# Patient Record
Sex: Female | Born: 1935
Health system: Southern US, Community
[De-identification: ages and names within clinical notes are randomized; demographics above are authoritative.]

## PROBLEM LIST (undated history)

## (undated) DIAGNOSIS — R350 Frequency of micturition: Secondary | ICD-10-CM

## (undated) DIAGNOSIS — E119 Type 2 diabetes mellitus without complications: Secondary | ICD-10-CM

## (undated) DIAGNOSIS — J449 Chronic obstructive pulmonary disease, unspecified: Secondary | ICD-10-CM

## (undated) DIAGNOSIS — E7800 Pure hypercholesterolemia, unspecified: Secondary | ICD-10-CM

## (undated) DIAGNOSIS — R7611 Nonspecific reaction to tuberculin skin test without active tuberculosis: Secondary | ICD-10-CM

## (undated) DIAGNOSIS — M1990 Unspecified osteoarthritis, unspecified site: Secondary | ICD-10-CM

## (undated) DIAGNOSIS — C3491 Malignant neoplasm of unspecified part of right bronchus or lung: Secondary | ICD-10-CM

## (undated) DIAGNOSIS — Z923 Personal history of irradiation: Secondary | ICD-10-CM

## (undated) DIAGNOSIS — Z8489 Family history of other specified conditions: Secondary | ICD-10-CM

## (undated) DIAGNOSIS — J45909 Unspecified asthma, uncomplicated: Secondary | ICD-10-CM

## (undated) DIAGNOSIS — IMO0001 Reserved for inherently not codable concepts without codable children: Secondary | ICD-10-CM

## (undated) DIAGNOSIS — I10 Essential (primary) hypertension: Secondary | ICD-10-CM

## (undated) DIAGNOSIS — M810 Age-related osteoporosis without current pathological fracture: Secondary | ICD-10-CM

## (undated) DIAGNOSIS — Z973 Presence of spectacles and contact lenses: Secondary | ICD-10-CM

## (undated) DIAGNOSIS — E559 Vitamin D deficiency, unspecified: Secondary | ICD-10-CM

## (undated) DIAGNOSIS — R251 Tremor, unspecified: Secondary | ICD-10-CM

## (undated) HISTORY — DX: Pure hypercholesterolemia, unspecified: E78.00

## (undated) HISTORY — DX: Age-related osteoporosis without current pathological fracture: M81.0

## (undated) HISTORY — DX: Malignant neoplasm of unspecified part of right bronchus or lung: C34.91

## (undated) HISTORY — PX: ABDOMINAL HYSTERECTOMY: SHX81

## (undated) HISTORY — PX: BREAST BIOPSY: SHX20

## (undated) HISTORY — DX: Personal history of irradiation: Z92.3

## (undated) HISTORY — PX: APPENDECTOMY: SHX54

## (undated) HISTORY — PX: BOWEL RESECTION: SHX1257

## (undated) HISTORY — DX: Vitamin D deficiency, unspecified: E55.9

## (undated) HISTORY — DX: Nonspecific reaction to tuberculin skin test without active tuberculosis: R76.11

## (undated) HISTORY — PX: COLONOSCOPY: SHX174

## (undated) HISTORY — DX: Chronic obstructive pulmonary disease, unspecified: J44.9

---

## 1999-02-18 ENCOUNTER — Ambulatory Visit (HOSPITAL_COMMUNITY): Admission: RE | Admit: 1999-02-18 | Discharge: 1999-02-18 | Payer: Self-pay | Admitting: Pulmonary Disease

## 1999-02-18 ENCOUNTER — Encounter: Payer: Self-pay | Admitting: Pulmonary Disease

## 2001-11-04 ENCOUNTER — Ambulatory Visit (HOSPITAL_COMMUNITY): Admission: RE | Admit: 2001-11-04 | Discharge: 2001-11-04 | Payer: Self-pay | Admitting: Pulmonary Disease

## 2001-11-04 ENCOUNTER — Encounter: Payer: Self-pay | Admitting: Pulmonary Disease

## 2001-11-13 ENCOUNTER — Encounter: Admission: RE | Admit: 2001-11-13 | Discharge: 2001-11-13 | Payer: Self-pay | Admitting: Pulmonary Disease

## 2001-11-13 ENCOUNTER — Encounter: Payer: Self-pay | Admitting: Pulmonary Disease

## 2002-05-07 ENCOUNTER — Encounter: Payer: Self-pay | Admitting: Pulmonary Disease

## 2002-05-07 ENCOUNTER — Encounter: Admission: RE | Admit: 2002-05-07 | Discharge: 2002-05-07 | Payer: Self-pay | Admitting: Pulmonary Disease

## 2002-12-08 ENCOUNTER — Encounter: Payer: Self-pay | Admitting: Pulmonary Disease

## 2002-12-08 ENCOUNTER — Encounter: Admission: RE | Admit: 2002-12-08 | Discharge: 2002-12-08 | Payer: Self-pay | Admitting: Pulmonary Disease

## 2004-12-13 ENCOUNTER — Ambulatory Visit (HOSPITAL_COMMUNITY): Admission: RE | Admit: 2004-12-13 | Discharge: 2004-12-13 | Payer: Self-pay | Admitting: Pulmonary Disease

## 2005-08-26 ENCOUNTER — Inpatient Hospital Stay (HOSPITAL_COMMUNITY): Admission: EM | Admit: 2005-08-26 | Discharge: 2005-09-05 | Payer: Self-pay | Admitting: Emergency Medicine

## 2005-12-05 ENCOUNTER — Encounter: Admission: RE | Admit: 2005-12-05 | Discharge: 2005-12-05 | Payer: Self-pay | Admitting: Pulmonary Disease

## 2007-04-17 ENCOUNTER — Encounter: Admission: RE | Admit: 2007-04-17 | Discharge: 2007-04-17 | Payer: Self-pay | Admitting: Pulmonary Disease

## 2007-05-24 ENCOUNTER — Emergency Department (HOSPITAL_COMMUNITY): Admission: EM | Admit: 2007-05-24 | Discharge: 2007-05-24 | Payer: Self-pay | Admitting: Emergency Medicine

## 2008-09-16 ENCOUNTER — Encounter: Admission: RE | Admit: 2008-09-16 | Discharge: 2008-09-16 | Payer: Self-pay | Admitting: Pulmonary Disease

## 2010-02-07 ENCOUNTER — Emergency Department (HOSPITAL_COMMUNITY): Admission: EM | Admit: 2010-02-07 | Discharge: 2010-02-08 | Payer: Self-pay | Admitting: Hematology & Oncology

## 2010-02-07 ENCOUNTER — Emergency Department (HOSPITAL_COMMUNITY): Admission: EM | Admit: 2010-02-07 | Discharge: 2010-02-07 | Payer: Self-pay | Admitting: Family Medicine

## 2010-02-14 ENCOUNTER — Ambulatory Visit (HOSPITAL_COMMUNITY): Admission: RE | Admit: 2010-02-14 | Discharge: 2010-02-14 | Payer: Self-pay | Admitting: Pulmonary Disease

## 2010-02-16 ENCOUNTER — Ambulatory Visit (HOSPITAL_COMMUNITY): Admission: RE | Admit: 2010-02-16 | Discharge: 2010-02-16 | Payer: Self-pay | Admitting: Pulmonary Disease

## 2011-02-26 LAB — POCT I-STAT, CHEM 8
BUN: 12 mg/dL (ref 6–23)
Calcium, Ion: 1.18 mmol/L (ref 1.12–1.32)
Chloride: 96 mEq/L (ref 96–112)
Potassium: 4 mEq/L (ref 3.5–5.1)
TCO2: 29 mmol/L (ref 0–100)

## 2011-04-21 NOTE — Discharge Summary (Signed)
NAMEADALIA, Erin Carr NO.:  [id number]   MEDICAL RECORD NO.:  [id number]          PATIENT TYPE:  INP   LOCATION:  5735                         FACILITY:  [facility]   PHYSICIAN:  Mina Marble, M.D.DATE OF BIRTH:  February 29, 1930   DATE OF ADMISSION:  08/26/2005  DATE OF DISCHARGE:  09/05/2005                                 DISCHARGE SUMMARY   DISCHARGE DIAGNOSES:  1.  Intestinal adhesions with small bowel obstruction.  2.  Diabetes mellitus, type 2, uncontrolled.  3.  Chronic obstructive pulmonary disease with asthma.  4.  Ascites due to #1.  5.  Gastroparesis and hypotonic bowel  6.  Hypertensive heart disease.  7.  Hypokalemia.  8.  Leukocytosis due to #1.   CONSULTATIONS:  General surgery, Currie Paris, M.D.   OPERATION/PROCEDURE:  Peritoneal adhesions lysis on August 29, 2005.   BRIEF HISTORY:  The patient is a 77 year old black female with a known  history of diabetes mellitus - type 2 on oral anti-diabetic medicine.  She  was admitted from the [facility] emergency department with  complaint of acute onset of mid abdominal pain with occasional nausea and  vomiting that did not improve with staying n.p.o. with only minimal amount  of liquid food intake.  The patient was found on abdominal x-ray series and  abdominal CT scan to have dilated small bowel loops but were filled with  fluid.   PAST MEDICAL HISTORY:  1.  The patient has a long history of diabetes mellitus, type 2, on oral      antidiabetic medications including metformin.  2.  She had an operation approximately 30 years ago for an abdominal      hysterectomy without complications until the point of this admission.   SOCIAL HISTORY:  The patient denies history of smoking and denies a history  of frequent alcoholic beverage intake.   REVIEW OF SYSTEMS:  Review of systems revealed the patient's history to be  as above.   ALLERGIES:  No drug allergies.   PHYSICAL EXAMINATION:  GENERAL:  Pertinent physical findings on August 26, 2005 to be a well-developed well-nourished black female, oriented x3 to  person, place and time.  VITAL SIGNS: On admission, temperature 98, pulse 80, respirations 18, blood  pressure 105/80.  HEENT:  The patient was alert and coherent.  Eyes revealed pupils are equal,  round and reactive to light.  NECK:  Supple with no masses or thyroid enlargement.  LUNGS:  Minimal sibilant rhonchi with cough.  HEART:  Regular rhythm with no rubs.  ABDOMEN:  Soft, but moderately protuberant with mild mid and epigastric  tenderness.  No masses were felt.  No organomegaly was detected.  GENITALIA:  Normal external female genitalia.  RECTAL:  The rectum revealed no masses.  EXTREMITIES:  Good strength and no edema bilaterally.   LABORATORY DATA:  The patient's serum glucose was above 350 mg%.  CT scan  revealed dilated fluid-filled, small bowel on abdominal CT scan.   HOSPITAL COURSE:  The patient's hospital course was one of no significant  improvement on  August 27, 2005, and general surgery consultation was  requested.  The temperature had slightly increased to 99.7 and WBC count had  increased to 19,500.  The calculated blood glucose remained elevated being  controlled by sliding scale coverage with NovoLog with the CBG levels being  in the 304 mg% to 265 mg% range.  The patient had slow peristalsis action  postoperatively and required several days before bile gas and bowel  movements were obtained, at least approximately four to five days for bowel  action until possibly September 04, 2005 before she was able to pass gas and  small bowel movements satisfactorily.  The patient did continue to improve  and was able to be discharged on September 05, 2005 for followup in my office  in November 2006.  She was advised to followup with the general surgeons in  November also as directed.      Mina Marble, M.D.   Electronically Signed     GRK/MEDQ  D:  11/30/2005  T:  12/01/2005  Job:  [id number]

## 2011-04-21 NOTE — Op Note (Signed)
NAMEKALEESI, GUYTON NO.:  [id number]   MEDICAL RECORD NO.:  [id number]          PATIENT TYPE:  INP   LOCATION:  5707                         FACILITY:  [facility]   PHYSICIAN:  Currie Paris, M.D.DATE OF BIRTH:  [birth year]   DATE OF PROCEDURE:  08/29/2005  DATE OF DISCHARGE:                                 OPERATIVE REPORT   OFFICE MEDICAL RECORD NUMBER:  [id number]   PREOPERATIVE DIAGNOSIS:  Small bowel obstruction secondary to adhesions.   POSTOPERATIVE DIAGNOSIS:  Small bowel obstruction secondary to adhesions.   OPERATIONS:  Exploratory laparotomy with lysis of adhesions.   SURGEON:  Currie Paris, M.D.   ASSISTANT:  Sharlet Salina T. Hoxworth, M.D.   ANESTHESIA:  General endotracheal.   CLINICAL HISTORY:  This is a 77 year old lady who has presented with  abdominal pain and distension.  Initial films were not remarkable, but a CT  looked to be small bowel obstruction.  She was treated initially with NG  suction and IV fluids; after that, diagnosis was made, but has failed to  improve over about 36 hours of NG suction.  Her abdomen seemed more  distended today and more tender in the lower abdomen, although she felt that  she was a little bit better with her NG tube.  After discussion with the  patient, we elected to proceed to surgery.   DESCRIPTION OF PROCEDURE:  The patient was seen in the holding area and she  had no further questions.  She was taken to the operating room and after  satisfactory general anesthesia had been obtained, the abdomen was prepped  and draped.  The time-out occurred.   A short midline incision from just above the umbilicus to just below it was  made.  She a lower midline scar from prior hysterectomy.  The abdominal  cavity was carefully entered and there was a little bloody fluid present.  There were markedly dilated loops of bowel and as I tried to manipulate  these out, I could see a very hard area of distended small  bowel posteriorly  that I clearly was not going to be able to safely manipulate with my limited  incision.  The incision was therefore extended superiorly and inferiorly.   Once that was done, I started to mobilize the distended bowel out of the  abdomen.  I saw some distal bowel that was completely empty.  As I was  pulling some of this up, the markedly distended and erythematous bowel came  up suddenly  with a little bit of the give and upon doing that, the  obstruction seemed to relieve itself, as all of a sudden the empty distal  bowel again to fill with fluid.  I manipulated all the small bowel out and  there was a loop of mid-jejunum that had been apparently trapped between 2  adhesions and the mesentery was edematous and hemorrhagic, and the bowel was  markedly edematous as well in this segment.  Distal to that, there was  another short segment that seemed to probably have been also involved.  However, there was nothing that  looked ischemic and this all appeared  completely healthy.   At this point, we manipulated about 2 L of small bowel contents retrograde  into the stomach,. where it was able to be suctioned out through the NG to  decompress this, as I thought we were going to have a difficult time  closing.  I then ran the small bowel to make sure there were no lesions  noted and everything else appeared okay.  There were no palpable masses in  the colon, the liver, gallbladder, etc.   Once this was done, I went ahead and placed the bowel back in in its usual  anatomic position.  The abdomen was then closed with a running #1 double-  looped PDS.  The wound was irrigated and the skin closed with staples.   The patient tolerated the procedure well.  There were no operative  complications.  All counts were correct.      Currie Paris, M.D.  Electronically Signed     CJS/MEDQ  D:  08/29/2005  T:  08/30/2005  Job:  [id number]   cc:   Mina Marble, M.D.  Fax:  [phone]

## 2011-04-21 NOTE — Consult Note (Signed)
NAMEALIEGHA, PAULLIN NO.:  [id number]   MEDICAL RECORD NO.:  [id number]          PATIENT TYPE:  INP   LOCATION:  5707                         FACILITY:  [facility]   PHYSICIAN:  Gabrielle Dare. Janee Morn, M.D.DATE OF BIRTH:  1930-11-29   DATE OF CONSULTATION:  08/27/2005  DATE OF DISCHARGE:                                   CONSULTATION   REASON FOR CONSULTATION:  Small bowel obstruction.   HISTORY OF PRESENT ILLNESS:  I was asked to evaluate this pleasant 77-year-  old African American female by Dr. Corine Shelter in regards to small  bowel obstruction.  She was admitted yesterday with abdominal pain.  Initial  workup included an abdominal series which was unremarkable.  CT scan of the  abdomen and pelvis showed some dilated small bowel loops but no clear  obstruction.  Plane films were repeated today but, again, were unimpressive.  I suspect this is due to dilated small bowel loops being fluid filled.  The  patient had some nausea and vomiting today and CT scan was repeated and more  clearly delineated partial small bowel obstruction.  Some contrast is  present in the smaller caliber small bowel distal to the area of obstruction  and some of this contrast enters into the colon.  An NG tube was just  placed.  The patient said she was previously having some cramping pain which  has resolved since the NG tube was inserted.  It evacuated about 800 mL.  She has had no flatus or bowel movement today.   PAST MEDICAL HISTORY:  Non-insulin dependent diabetes mellitus.   PAST SURGICAL HISTORY:  Hysterectomy via lower midline incision many years  ago.   ALLERGIES:  No known drug allergies.   MEDICATIONS CURRENTLY:  Please see the chart.   REVIEW OF SYMPTOMS:  CARDIAC:  Negative.  PULMONARY:  Negative.  GI:  See  history of present illness.  She has no previous history of bowel  obstruction.  GU:  Negative.  MUSCULOSKELETAL:  Negative.  GENERAL:  Negative.   PHYSICAL  EXAMINATION:  VITAL SIGNS:  Temperature 99.7, blood pressure  130/60, heart rate 97, respirations 20.  GENERAL:  She is awake and alert.  HEENT:  Sclerae is clear.  NECK:  Supple.  LUNGS:  Clear to auscultation with normal respiratory excursion.  HEART:  Regular, impulse is palpable along the left chest.  ABDOMEN:  Somewhat distended, bowel sounds present, she has no appreciable  tenderness at this time.  No masses are noted.  SKIN:  Warm and dry.   LABORATORY DATA:  Sodium 141, potassium 4.4, chloride 109, CO2 26, BUN 29,  creatinine 1.5, glucose 292.  White blood cell count 19.5, hemoglobin 13.2,  platelets 270.   IMPRESSION:  Partial small bowel obstruction.   RECOMMENDATIONS:  Continue her NG tube decompression.  We will follow her  very closely.  If this obstruction, which is likely due to adhesions, does  not resolve or the patient's pain returns, she will need exploration with  lysis of adhesions.  This plan was discussed in detail with the patient.  Gabrielle Dare Janee Morn, M.D.  Electronically Signed     BET/MEDQ  D:  08/27/2005  T:  08/28/2005  Job:  [id number]   cc:   Mina Marble, M.D.  Fax: [phone]

## 2011-09-20 LAB — DIFFERENTIAL
Basophils Absolute: 0
Basophils Relative: 0
Eosinophils Relative: 1
Lymphocytes Relative: 8 — ABNORMAL LOW
Lymphs Abs: 1.2
Monocytes Absolute: 0.9 — ABNORMAL HIGH
Neutro Abs: 12.7 — ABNORMAL HIGH
Neutrophils Relative %: 85 — ABNORMAL HIGH

## 2011-09-20 LAB — COMPREHENSIVE METABOLIC PANEL
BUN: 14
CO2: 21
Calcium: 9.2
GFR calc Af Amer: >60
Potassium: 3.7
Sodium: 132 — ABNORMAL LOW
Total Protein: 6.7

## 2011-09-20 LAB — CBC
Hemoglobin: 12.7
MCHC: 32.3
MCV: 81.8

## 2011-11-09 ENCOUNTER — Other Ambulatory Visit: Payer: Self-pay | Admitting: Pulmonary Disease

## 2011-11-09 DIAGNOSIS — Z1231 Encounter for screening mammogram for malignant neoplasm of breast: Secondary | ICD-10-CM

## 2011-12-07 ENCOUNTER — Ambulatory Visit
Admission: RE | Admit: 2011-12-07 | Discharge: 2011-12-07 | Disposition: A | Discharge status: Home / Self Care | Payer: Medicare Other | Source: Ambulatory Visit | Attending: Pulmonary Disease | Admitting: Pulmonary Disease

## 2011-12-07 DIAGNOSIS — Z1231 Encounter for screening mammogram for malignant neoplasm of breast: Secondary | ICD-10-CM

## 2012-01-16 DIAGNOSIS — E119 Type 2 diabetes mellitus without complications: Secondary | ICD-10-CM | POA: Diagnosis not present

## 2012-01-16 DIAGNOSIS — I119 Hypertensive heart disease without heart failure: Secondary | ICD-10-CM | POA: Diagnosis not present

## 2012-01-16 DIAGNOSIS — J45909 Unspecified asthma, uncomplicated: Secondary | ICD-10-CM | POA: Diagnosis not present

## 2012-01-16 DIAGNOSIS — Z79899 Other long term (current) drug therapy: Secondary | ICD-10-CM | POA: Diagnosis not present

## 2012-01-16 DIAGNOSIS — E7800 Pure hypercholesterolemia, unspecified: Secondary | ICD-10-CM | POA: Diagnosis not present

## 2012-05-14 DIAGNOSIS — E7800 Pure hypercholesterolemia, unspecified: Secondary | ICD-10-CM | POA: Diagnosis not present

## 2012-05-14 DIAGNOSIS — J45902 Unspecified asthma with status asthmaticus: Secondary | ICD-10-CM | POA: Diagnosis not present

## 2012-05-14 DIAGNOSIS — R259 Unspecified abnormal involuntary movements: Secondary | ICD-10-CM | POA: Diagnosis not present

## 2012-05-14 DIAGNOSIS — Z79899 Other long term (current) drug therapy: Secondary | ICD-10-CM | POA: Diagnosis not present

## 2012-05-14 DIAGNOSIS — I119 Hypertensive heart disease without heart failure: Secondary | ICD-10-CM | POA: Diagnosis not present

## 2012-08-26 DIAGNOSIS — E119 Type 2 diabetes mellitus without complications: Secondary | ICD-10-CM | POA: Diagnosis not present

## 2012-08-26 DIAGNOSIS — I119 Hypertensive heart disease without heart failure: Secondary | ICD-10-CM | POA: Diagnosis not present

## 2012-08-26 DIAGNOSIS — J019 Acute sinusitis, unspecified: Secondary | ICD-10-CM | POA: Diagnosis not present

## 2012-08-26 DIAGNOSIS — E7800 Pure hypercholesterolemia, unspecified: Secondary | ICD-10-CM | POA: Diagnosis not present

## 2012-08-26 DIAGNOSIS — D638 Anemia in other chronic diseases classified elsewhere: Secondary | ICD-10-CM | POA: Diagnosis not present

## 2012-08-26 DIAGNOSIS — M810 Age-related osteoporosis without current pathological fracture: Secondary | ICD-10-CM | POA: Diagnosis not present

## 2012-08-26 DIAGNOSIS — Z23 Encounter for immunization: Secondary | ICD-10-CM | POA: Diagnosis not present

## 2012-08-26 DIAGNOSIS — I10 Essential (primary) hypertension: Secondary | ICD-10-CM | POA: Diagnosis not present

## 2012-11-21 DIAGNOSIS — E559 Vitamin D deficiency, unspecified: Secondary | ICD-10-CM | POA: Diagnosis not present

## 2012-11-21 DIAGNOSIS — J449 Chronic obstructive pulmonary disease, unspecified: Secondary | ICD-10-CM | POA: Diagnosis not present

## 2013-02-20 DIAGNOSIS — IMO0001 Reserved for inherently not codable concepts without codable children: Secondary | ICD-10-CM | POA: Diagnosis not present

## 2013-03-31 DIAGNOSIS — H2510 Age-related nuclear cataract, unspecified eye: Secondary | ICD-10-CM | POA: Diagnosis not present

## 2013-03-31 DIAGNOSIS — E119 Type 2 diabetes mellitus without complications: Secondary | ICD-10-CM | POA: Diagnosis not present

## 2013-03-31 DIAGNOSIS — E11319 Type 2 diabetes mellitus with unspecified diabetic retinopathy without macular edema: Secondary | ICD-10-CM | POA: Diagnosis not present

## 2013-07-31 DIAGNOSIS — H2510 Age-related nuclear cataract, unspecified eye: Secondary | ICD-10-CM | POA: Diagnosis not present

## 2013-07-31 DIAGNOSIS — E119 Type 2 diabetes mellitus without complications: Secondary | ICD-10-CM | POA: Diagnosis not present

## 2013-07-31 DIAGNOSIS — E11319 Type 2 diabetes mellitus with unspecified diabetic retinopathy without macular edema: Secondary | ICD-10-CM | POA: Diagnosis not present

## 2013-10-16 DIAGNOSIS — I119 Hypertensive heart disease without heart failure: Secondary | ICD-10-CM | POA: Diagnosis not present

## 2013-10-16 DIAGNOSIS — M109 Gout, unspecified: Secondary | ICD-10-CM | POA: Diagnosis not present

## 2013-10-16 DIAGNOSIS — J449 Chronic obstructive pulmonary disease, unspecified: Secondary | ICD-10-CM | POA: Diagnosis not present

## 2013-10-16 DIAGNOSIS — E119 Type 2 diabetes mellitus without complications: Secondary | ICD-10-CM | POA: Diagnosis not present

## 2013-10-16 DIAGNOSIS — E559 Vitamin D deficiency, unspecified: Secondary | ICD-10-CM | POA: Diagnosis not present

## 2013-10-16 DIAGNOSIS — Z23 Encounter for immunization: Secondary | ICD-10-CM | POA: Diagnosis not present

## 2013-12-30 ENCOUNTER — Other Ambulatory Visit: Payer: Self-pay

## 2013-12-30 DIAGNOSIS — Z1231 Encounter for screening mammogram for malignant neoplasm of breast: Secondary | ICD-10-CM

## 2014-01-15 ENCOUNTER — Ambulatory Visit
Admission: RE | Admit: 2014-01-15 | Discharge: 2014-01-15 | Disposition: A | Discharge status: Home / Self Care | Payer: Medicare Other | Source: Ambulatory Visit

## 2014-01-15 DIAGNOSIS — Z1231 Encounter for screening mammogram for malignant neoplasm of breast: Secondary | ICD-10-CM | POA: Diagnosis not present

## 2014-02-12 DIAGNOSIS — J449 Chronic obstructive pulmonary disease, unspecified: Secondary | ICD-10-CM | POA: Diagnosis not present

## 2014-02-12 DIAGNOSIS — IMO0001 Reserved for inherently not codable concepts without codable children: Secondary | ICD-10-CM | POA: Diagnosis not present

## 2014-02-12 DIAGNOSIS — I119 Hypertensive heart disease without heart failure: Secondary | ICD-10-CM | POA: Diagnosis not present

## 2014-02-12 DIAGNOSIS — N8111 Cystocele, midline: Secondary | ICD-10-CM | POA: Diagnosis not present

## 2014-02-19 DIAGNOSIS — E1139 Type 2 diabetes mellitus with other diabetic ophthalmic complication: Secondary | ICD-10-CM | POA: Diagnosis not present

## 2014-02-19 DIAGNOSIS — E11319 Type 2 diabetes mellitus with unspecified diabetic retinopathy without macular edema: Secondary | ICD-10-CM | POA: Diagnosis not present

## 2014-02-19 DIAGNOSIS — H2510 Age-related nuclear cataract, unspecified eye: Secondary | ICD-10-CM | POA: Diagnosis not present

## 2014-03-20 DIAGNOSIS — Z124 Encounter for screening for malignant neoplasm of cervix: Secondary | ICD-10-CM | POA: Diagnosis not present

## 2014-03-20 DIAGNOSIS — N8111 Cystocele, midline: Secondary | ICD-10-CM | POA: Diagnosis not present

## 2014-04-07 DIAGNOSIS — Z469 Encounter for fitting and adjustment of unspecified device: Secondary | ICD-10-CM | POA: Diagnosis not present

## 2014-05-12 DIAGNOSIS — N8111 Cystocele, midline: Secondary | ICD-10-CM | POA: Diagnosis not present

## 2014-05-27 DIAGNOSIS — N8111 Cystocele, midline: Secondary | ICD-10-CM | POA: Diagnosis not present

## 2014-05-27 DIAGNOSIS — R300 Dysuria: Secondary | ICD-10-CM | POA: Diagnosis not present

## 2014-06-09 DIAGNOSIS — IMO0002 Reserved for concepts with insufficient information to code with codable children: Secondary | ICD-10-CM | POA: Diagnosis not present

## 2014-06-09 DIAGNOSIS — N8111 Cystocele, midline: Secondary | ICD-10-CM | POA: Diagnosis not present

## 2014-06-09 DIAGNOSIS — N9989 Other postprocedural complications and disorders of genitourinary system: Secondary | ICD-10-CM | POA: Diagnosis not present

## 2014-06-11 DIAGNOSIS — I119 Hypertensive heart disease without heart failure: Secondary | ICD-10-CM | POA: Diagnosis not present

## 2014-06-11 DIAGNOSIS — J449 Chronic obstructive pulmonary disease, unspecified: Secondary | ICD-10-CM | POA: Diagnosis not present

## 2014-06-11 DIAGNOSIS — E119 Type 2 diabetes mellitus without complications: Secondary | ICD-10-CM | POA: Diagnosis not present

## 2014-06-11 DIAGNOSIS — E559 Vitamin D deficiency, unspecified: Secondary | ICD-10-CM | POA: Diagnosis not present

## 2014-07-30 DIAGNOSIS — N8111 Cystocele, midline: Secondary | ICD-10-CM | POA: Diagnosis not present

## 2014-08-03 DIAGNOSIS — N950 Postmenopausal bleeding: Secondary | ICD-10-CM | POA: Diagnosis not present

## 2014-08-12 DIAGNOSIS — N950 Postmenopausal bleeding: Secondary | ICD-10-CM | POA: Diagnosis not present

## 2014-09-21 DIAGNOSIS — N8110 Cystocele, unspecified: Secondary | ICD-10-CM | POA: Diagnosis not present

## 2014-10-15 DIAGNOSIS — I119 Hypertensive heart disease without heart failure: Secondary | ICD-10-CM | POA: Diagnosis not present

## 2014-10-15 DIAGNOSIS — Z23 Encounter for immunization: Secondary | ICD-10-CM | POA: Diagnosis not present

## 2014-10-15 DIAGNOSIS — J449 Chronic obstructive pulmonary disease, unspecified: Secondary | ICD-10-CM | POA: Diagnosis not present

## 2014-10-15 DIAGNOSIS — J4521 Mild intermittent asthma with (acute) exacerbation: Secondary | ICD-10-CM | POA: Diagnosis not present

## 2014-10-15 DIAGNOSIS — Z79899 Other long term (current) drug therapy: Secondary | ICD-10-CM | POA: Diagnosis not present

## 2014-10-19 DIAGNOSIS — R58 Hemorrhage, not elsewhere classified: Secondary | ICD-10-CM | POA: Diagnosis not present

## 2014-10-19 DIAGNOSIS — N8110 Cystocele, unspecified: Secondary | ICD-10-CM | POA: Diagnosis not present

## 2014-12-02 ENCOUNTER — Other Ambulatory Visit: Payer: Self-pay | Admitting: Obstetrics and Gynecology

## 2014-12-07 ENCOUNTER — Other Ambulatory Visit (HOSPITAL_COMMUNITY): Payer: Self-pay | Admitting: Obstetrics and Gynecology

## 2014-12-07 NOTE — H&P (Signed)
Erin Carr is a 81 y.o.  female G0 for anterior-posterior vaginal repair because of symptomatic pelvic prolapse.  Since 2012 the patient reports urinary urgency, incontinence and slowed bowel emptying that was initially treated with observation.   Over the past 7 months however, she  began using a pessary.  The device offered some relief however, it caused ulceration of her vaginal wall with accompanying granulation tissue. Patient admits to a recurrence of urinary incontinence, urgency and constipation once she had to discontinue the device. .  She denies any pelvic pain, vaginitis symptoms, hematuria, dysuria or lower back pain. The patient was placed on estrogen vaginal cream to improve  atrophic vaginal changes and to promote healing of the vaginal mucosa.  A review of both medical and surgical management options were given to the patient however, she has decided to proceed with surgical management of her pelvic prolapse.   Past Medical History  OB History: G0  GYN History: menarche: 81 YO    LMP: menopausal    Contracepton;   Denies history of abnormal PAP smear;   Last PAP smear: 2015-normal Medical History: Diabetes Mellitus, Hyperlipidemia, Hypertension, Asthma and Anemia  Surgical History: 1962 Hysterectomy/Appendectomy and Blood Transfusion Denies problems with anesthesia.  Family History: Diabetes Mellitus, Hypertension, Cardiovascular Disease, Prostate Cancer and "Female Organ" Cancer   Social History: Single;  Denies tobacco or alcohol use   Outpatient Encounter Prescriptions as of 12/07/2014  Medication Sig  . amLODipine (NORVASC) 10 MG tablet Take 10 mg by mouth daily.  Marland Kitchen aspirin 81 MG chewable tablet Chew 81 mg by mouth daily.  Marland Kitchen estradiol (ESTRACE) 0.1 MG/GM vaginal cream Place 1 Applicatorful vaginally 3 (three) times a week.  . furosemide (LASIX) 40 MG tablet Take 40 mg by mouth 2 (two) times daily.  Marland Kitchen gabapentin (NEURONTIN) 400 MG capsule Take 400 mg by mouth 2 (two)  times daily.  Marland Kitchen glipiZIDE (GLUCOTROL) 5 MG tablet Take 5 mg by mouth daily before breakfast.  . labetalol (NORMODYNE) 100 MG tablet Take 100 mg by mouth 2 (two) times daily.  Marland Kitchen lisinopril (PRINIVIL,ZESTRIL) 20 MG tablet Take 20 mg by mouth 2 (two) times daily.  . metFORMIN (GLUCOPHAGE) 500 MG tablet Take 500 mg by mouth 2 (two) times daily with a meal.  . potassium chloride (K-DUR) 10 MEQ tablet Take 10 mEq by mouth daily.  . propranolol (INDERAL) 10 MG tablet Take 10 mg by mouth 2 (two) times daily.  . simvastatin (ZOCOR) 20 MG tablet Take 20 mg by mouth daily.    No Known Allergies   Denies sensitivity to peanuts, shellfish, soy, latex or adhesives.   ROS: Admits to glasses, upper jaw dentures, constipation, shortness of breath only with asthma attacks and rare bilateral pedal edema;  Denies headache, vision changes, nasal congestion, dysphagia, tinnitus, dizziness, hoarseness, cough,  chest pain,  nausea, vomiting, diarrhea,  urinary frequency,  dysuria, hematuria, vaginitis symptoms, pelvic pain, swelling of joints,easy bruising,  myalgias, arthralgias, skin rashes, unexplained weight loss and except as is mentioned in the history of present illness, patient's review of systems is otherwise negative.  Physical Exam  B/P: 138/70   P: 64 bpm    R: 16 Weight: 155 lbs.  Height: 5'2.5 "  BMI: 27.9  Neck: supple without masses or thyromegaly Lungs: clear to auscultation Heart: regular rate and rhythm Abdomen: soft, non-tender and no organomegaly Pelvic:EGBUS- wnl; vagina-atrophic with 4/4 cystocele bulging from the vaginal opening with a 2/4 rectocele; uterus/cervix-surgically absent; adnexae-no tenderness or masses  Extremities:  no clubbing, cyanosis or edema   Assesment: Pelvic Prolapse                       Large Cystocele   Disposition:  A discussion was held with patient regarding the indication for her procedure(s) along with the risks, which include but are not limited to:  reaction to anesthesia, damage to adjacent organs, infection and excessive bleeding. The patient has decided to proceed with an Anterior Repair with Possible Posterior Repair at [place] on December 16, 2014.  CSN# [id number]   Markeda Narvaez J. Florene Glen, PA-C  for Dr. Franklyn Lor. Dillard

## 2014-12-09 ENCOUNTER — Other Ambulatory Visit: Payer: Self-pay

## 2014-12-09 ENCOUNTER — Encounter (HOSPITAL_COMMUNITY)
Admission: RE | Admit: 2014-12-09 | Discharge: 2014-12-09 | Disposition: A | Discharge status: Home / Self Care | Payer: Medicare Other | Source: Ambulatory Visit | Attending: Obstetrics and Gynecology | Admitting: Obstetrics and Gynecology

## 2014-12-09 ENCOUNTER — Encounter (HOSPITAL_COMMUNITY): Payer: Self-pay

## 2014-12-09 DIAGNOSIS — Z01818 Encounter for other preprocedural examination: Secondary | ICD-10-CM | POA: Diagnosis not present

## 2014-12-09 HISTORY — DX: Unspecified asthma, uncomplicated: J45.909

## 2014-12-09 HISTORY — DX: Reserved for inherently not codable concepts without codable children: IMO0001

## 2014-12-09 HISTORY — DX: Type 2 diabetes mellitus without complications: E11.9

## 2014-12-09 HISTORY — DX: Tremor, unspecified: R25.1

## 2014-12-09 HISTORY — DX: Pure hypercholesterolemia, unspecified: E78.00

## 2014-12-09 HISTORY — DX: Essential (primary) hypertension: I10

## 2014-12-09 LAB — BASIC METABOLIC PANEL
ANION GAP: 6 (ref 5–15)
BUN: 14 mg/dL (ref 6–23)
CHLORIDE: 105 meq/L (ref 96–112)
CO2: 30 mmol/L (ref 19–32)
Calcium: 9.5 mg/dL (ref 8.4–10.5)
Creatinine, Ser: 0.71 mg/dL (ref 0.50–1.10)
GFR calc non Af Amer: 78 mL/min — ABNORMAL LOW (ref >90)
Glucose, Bld: 225 mg/dL — ABNORMAL HIGH (ref 70–99)
Potassium: 3.6 mmol/L (ref 3.5–5.1)
SODIUM: 141 mmol/L (ref 135–145)

## 2014-12-09 LAB — CBC
HCT: 36.9 % (ref 36.0–46.0)
HEMOGLOBIN: 11.4 g/dL — AB (ref 12.0–15.0)
MCH: 26 pg (ref 26.0–34.0)
MCHC: 30.9 g/dL (ref 30.0–36.0)
MCV: 84.1 fL (ref 78.0–100.0)
PLATELETS: 198 10*3/uL (ref 150–400)
RBC: 4.39 MIL/uL (ref 3.87–5.11)
RDW: 14.1 % (ref 11.5–15.5)
WBC: 7.1 10*3/uL (ref 4.0–10.5)

## 2014-12-09 NOTE — Patient Instructions (Addendum)
Your procedure is scheduled on:  Wednesday, December 16, 2014  Enter through the [employer] [facility] at: 11:30 A.M.  Pick up the phone at the desk and dial [phone].  Call this number if you have problems the morning of surgery: [phone].  Remember: Do NOT eat food: AFTER MIDNIGHT TUESDAY  Do NOT drink clear liquids after:  AFTER 9:00 A.Laverle Hobby Take these medicines the morning of surgery with a SIP OF WATER: TAKE ALL MORNING MEDICATIONS *EXCEPT ASPIRIN, LASIX AND GLUCOTROL* *DO NOT TAKE METFORMIN 24 HOURS PRIOR TO SURGERY  Do NOT wear jewelry (body piercing), metal hair clips/bobby pins, make-up, or nail polish. Do NOT wear lotions, powders, or perfumes.  You may wear deoderant. Do NOT shave for 48 hours prior to surgery. Do NOT bring valuables to the hospital. Contacts, dentures, or bridgework may not be worn into surgery. Have a responsible adult drive you home and stay with you for 24 hours after your procedure

## 2014-12-16 ENCOUNTER — Encounter (HOSPITAL_COMMUNITY)
Admission: RE | Disposition: A | Discharge status: Home / Self Care | Payer: Self-pay | Source: Ambulatory Visit | Attending: Obstetrics and Gynecology

## 2014-12-16 ENCOUNTER — Ambulatory Visit (HOSPITAL_COMMUNITY): Payer: Medicare Other | Admitting: Anesthesiology

## 2014-12-16 ENCOUNTER — Observation Stay (HOSPITAL_COMMUNITY)
Admission: RE | Admit: 2014-12-16 | Discharge: 2014-12-18 | Disposition: A | Discharge status: Home / Self Care | Payer: Medicare Other | Source: Ambulatory Visit | Attending: Obstetrics and Gynecology | Admitting: Obstetrics and Gynecology

## 2014-12-16 ENCOUNTER — Encounter (HOSPITAL_COMMUNITY): Payer: Self-pay | Admitting: *Deleted

## 2014-12-16 DIAGNOSIS — Z7982 Long term (current) use of aspirin: Secondary | ICD-10-CM | POA: Diagnosis not present

## 2014-12-16 DIAGNOSIS — J45909 Unspecified asthma, uncomplicated: Secondary | ICD-10-CM | POA: Insufficient documentation

## 2014-12-16 DIAGNOSIS — N8189 Other female genital prolapse: Secondary | ICD-10-CM | POA: Insufficient documentation

## 2014-12-16 DIAGNOSIS — E119 Type 2 diabetes mellitus without complications: Secondary | ICD-10-CM | POA: Diagnosis not present

## 2014-12-16 DIAGNOSIS — N819 Female genital prolapse, unspecified: Secondary | ICD-10-CM | POA: Diagnosis present

## 2014-12-16 DIAGNOSIS — R3915 Urgency of urination: Secondary | ICD-10-CM | POA: Diagnosis present

## 2014-12-16 DIAGNOSIS — N8110 Cystocele, unspecified: Principal | ICD-10-CM | POA: Insufficient documentation

## 2014-12-16 DIAGNOSIS — N816 Rectocele: Secondary | ICD-10-CM | POA: Diagnosis not present

## 2014-12-16 DIAGNOSIS — I10 Essential (primary) hypertension: Secondary | ICD-10-CM | POA: Insufficient documentation

## 2014-12-16 DIAGNOSIS — E785 Hyperlipidemia, unspecified: Secondary | ICD-10-CM | POA: Diagnosis not present

## 2014-12-16 DIAGNOSIS — D649 Anemia, unspecified: Secondary | ICD-10-CM | POA: Insufficient documentation

## 2014-12-16 DIAGNOSIS — R5082 Postprocedural fever: Secondary | ICD-10-CM | POA: Insufficient documentation

## 2014-12-16 DIAGNOSIS — N3941 Urge incontinence: Secondary | ICD-10-CM | POA: Diagnosis not present

## 2014-12-16 DIAGNOSIS — IMO0002 Reserved for concepts with insufficient information to code with codable children: Secondary | ICD-10-CM

## 2014-12-16 HISTORY — PX: ANTERIOR AND POSTERIOR REPAIR: SHX5121

## 2014-12-16 LAB — GLUCOSE, CAPILLARY
Glucose-Capillary: 133 mg/dL — ABNORMAL HIGH (ref 70–99)
Glucose-Capillary: 84 mg/dL (ref 70–99)

## 2014-12-16 SURGERY — ANTERIOR (CYSTOCELE) AND POSTERIOR REPAIR (RECTOCELE)
Anesthesia: Spinal | Site: Vagina

## 2014-12-16 MED ORDER — VASOPRESSIN 20 UNIT/ML IV SOLN
INTRAVENOUS | Status: AC
  Filled 2014-12-16: qty 1

## 2014-12-16 MED ORDER — LACTATED RINGERS IV SOLN
INTRAVENOUS | Status: DC
  Administered 2014-12-16 – 2014-12-17 (×2): via INTRAVENOUS

## 2014-12-16 MED ORDER — MORPHINE SULFATE 0.5 MG/ML IJ SOLN
INTRAMUSCULAR | Status: AC
  Filled 2014-12-16: qty 10

## 2014-12-16 MED ORDER — ONDANSETRON HCL 4 MG/2ML IJ SOLN
INTRAMUSCULAR | Status: AC
  Filled 2014-12-16: qty 2

## 2014-12-16 MED ORDER — SODIUM CHLORIDE 0.9 % IJ SOLN
INTRAMUSCULAR | Status: DC | PRN
  Administered 2014-12-16: 50 mL via INTRAVENOUS

## 2014-12-16 MED ORDER — GABAPENTIN 400 MG PO CAPS
400.0000 mg | ORAL_CAPSULE | Freq: Two times a day (BID) | ORAL | Status: DC
  Administered 2014-12-16 – 2014-12-18 (×4): 400 mg via ORAL
  Filled 2014-12-16 (×6): qty 1

## 2014-12-16 MED ORDER — CIPROFLOXACIN HCL 250 MG PO TABS
250.0000 mg | ORAL_TABLET | Freq: Two times a day (BID) | ORAL | Status: DC
  Administered 2014-12-16 – 2014-12-18 (×4): 250 mg via ORAL
  Filled 2014-12-16 (×6): qty 1

## 2014-12-16 MED ORDER — ESTRADIOL 0.1 MG/GM VA CREA
TOPICAL_CREAM | VAGINAL | Status: AC
  Filled 2014-12-16: qty 42.5

## 2014-12-16 MED ORDER — SODIUM CHLORIDE 0.9 % IJ SOLN
INTRAMUSCULAR | Status: AC
  Filled 2014-12-16: qty 50

## 2014-12-16 MED ORDER — PROPOFOL 10 MG/ML IV BOLUS
INTRAVENOUS | Status: AC
  Filled 2014-12-16: qty 20

## 2014-12-16 MED ORDER — DOCUSATE SODIUM 100 MG PO CAPS
100.0000 mg | ORAL_CAPSULE | Freq: Two times a day (BID) | ORAL | Status: DC
  Administered 2014-12-16 – 2014-12-18 (×4): 100 mg via ORAL
  Filled 2014-12-16 (×4): qty 1

## 2014-12-16 MED ORDER — PROPOFOL INFUSION 10 MG/ML OPTIME
INTRAVENOUS | Status: DC | PRN
  Administered 2014-12-16 (×2): 50 ug/kg/min via INTRAVENOUS

## 2014-12-16 MED ORDER — MENTHOL 3 MG MT LOZG: 1.0000 | LOZENGE | OROMUCOSAL | Status: DC | PRN

## 2014-12-16 MED ORDER — SIMVASTATIN 20 MG PO TABS
20.0000 mg | ORAL_TABLET | Freq: Every day | ORAL | Status: DC
  Administered 2014-12-16 – 2014-12-17 (×2): 20 mg via ORAL
  Filled 2014-12-16 (×3): qty 1

## 2014-12-16 MED ORDER — ONDANSETRON HCL 4 MG PO TABS: 4.0000 mg | ORAL_TABLET | Freq: Three times a day (TID) | ORAL | Status: DC | PRN

## 2014-12-16 MED ORDER — MORPHINE SULFATE (PF) 0.5 MG/ML IJ SOLN
INTRAMUSCULAR | Status: DC | PRN
  Administered 2014-12-16: .1 mg via INTRATHECAL

## 2014-12-16 MED ORDER — LABETALOL HCL 100 MG PO TABS
100.0000 mg | ORAL_TABLET | Freq: Two times a day (BID) | ORAL | Status: DC
  Filled 2014-12-16 (×6): qty 1

## 2014-12-16 MED ORDER — POTASSIUM CHLORIDE ER 10 MEQ PO TBCR
10.0000 meq | EXTENDED_RELEASE_TABLET | Freq: Every day | ORAL | Status: DC
  Administered 2014-12-17 – 2014-12-18 (×2): 10 meq via ORAL
  Filled 2014-12-16 (×3): qty 1

## 2014-12-16 MED ORDER — SCOPOLAMINE 1 MG/3DAYS TD PT72: 1.0000 | MEDICATED_PATCH | Freq: Once | TRANSDERMAL | Status: DC

## 2014-12-16 MED ORDER — LACTATED RINGERS IV SOLN
INTRAVENOUS | Status: DC
  Administered 2014-12-16 (×2): via INTRAVENOUS

## 2014-12-16 MED ORDER — PROPRANOLOL HCL 10 MG PO TABS
10.0000 mg | ORAL_TABLET | Freq: Two times a day (BID) | ORAL | Status: DC
  Administered 2014-12-16: 10 mg via ORAL
  Filled 2014-12-16 (×6): qty 1

## 2014-12-16 MED ORDER — LISINOPRIL 20 MG PO TABS
20.0000 mg | ORAL_TABLET | Freq: Two times a day (BID) | ORAL | Status: DC
  Administered 2014-12-16: 20 mg via ORAL
  Filled 2014-12-16 (×6): qty 1

## 2014-12-16 MED ORDER — FENTANYL CITRATE 0.05 MG/ML IJ SOLN
INTRAMUSCULAR | Status: AC
  Filled 2014-12-16: qty 2

## 2014-12-16 MED ORDER — LABETALOL HCL 200 MG PO TABS
100.0000 mg | ORAL_TABLET | Freq: Once | ORAL | Status: AC
  Administered 2014-12-16: 100 mg via ORAL

## 2014-12-16 MED ORDER — FUROSEMIDE 40 MG PO TABS
40.0000 mg | ORAL_TABLET | Freq: Two times a day (BID) | ORAL | Status: DC
  Administered 2014-12-17 – 2014-12-18 (×2): 40 mg via ORAL
  Filled 2014-12-16 (×5): qty 1

## 2014-12-16 MED ORDER — GLIPIZIDE 5 MG PO TABS
5.0000 mg | ORAL_TABLET | Freq: Every day | ORAL | Status: DC
  Administered 2014-12-17 – 2014-12-18 (×2): 5 mg via ORAL
  Filled 2014-12-16 (×4): qty 1

## 2014-12-16 MED ORDER — METFORMIN HCL 500 MG PO TABS
500.0000 mg | ORAL_TABLET | Freq: Two times a day (BID) | ORAL | Status: DC
  Administered 2014-12-17 – 2014-12-18 (×3): 500 mg via ORAL
  Filled 2014-12-16 (×5): qty 1

## 2014-12-16 MED ORDER — VASOPRESSIN 20 UNIT/ML IV SOLN
INTRAVENOUS | Status: DC | PRN
  Administered 2014-12-16: 20 [IU]

## 2014-12-16 MED ORDER — FENTANYL CITRATE 0.05 MG/ML IJ SOLN
INTRAMUSCULAR | Status: DC | PRN
  Administered 2014-12-16: 50 ug via INTRAVENOUS

## 2014-12-16 MED ORDER — ONDANSETRON HCL 4 MG/2ML IJ SOLN
INTRAMUSCULAR | Status: DC | PRN
  Administered 2014-12-16: 4 mg via INTRAVENOUS

## 2014-12-16 MED ORDER — HYDROCODONE-ACETAMINOPHEN 5-325 MG PO TABS: 1.0000 | ORAL_TABLET | Freq: Four times a day (QID) | ORAL | Status: DC | PRN

## 2014-12-16 MED ORDER — AMLODIPINE BESYLATE 10 MG PO TABS
10.0000 mg | ORAL_TABLET | Freq: Every day | ORAL | Status: DC
Start: 2014-12-17 — End: 2014-12-18
  Filled 2014-12-16 (×3): qty 1

## 2014-12-16 SURGICAL SUPPLY — 27 items
CANISTER SUCT 3000ML (MISCELLANEOUS) ×3 IMPLANT
CATH FOLEY 2WAY  5CC 16FR SIL (CATHETERS) ×2
CATH FOLEY 2WAY 5CC 16FR SIL (CATHETERS) ×1 IMPLANT
CLOTH BEACON ORANGE TIMEOUT ST (SAFETY) ×3 IMPLANT
CONT PATH 16OZ SNAP LID 3702 (MISCELLANEOUS) IMPLANT
DECANTER SPIKE VIAL GLASS SM (MISCELLANEOUS) ×2 IMPLANT
DRAPE SHEET LG 3/4 BI-LAMINATE (DRAPES) ×6 IMPLANT
GLOVE BIO SURGEON STRL SZ 6.5 (GLOVE) ×3 IMPLANT
GLOVE BIO SURGEONS STRL SZ 6.5 (GLOVE) ×2
GLOVE BIOGEL PI IND STRL 7.0 (GLOVE) ×1 IMPLANT
GLOVE BIOGEL PI INDICATOR 7.0 (GLOVE) ×4
GOWN STRL REUS W/TWL LRG LVL3 (GOWN DISPOSABLE) ×12 IMPLANT
NDL SPNL 22GX3.5 QUINCKE BK (NEEDLE) IMPLANT
NEEDLE HYPO 22GX1.5 SAFETY (NEEDLE) IMPLANT
NEEDLE SPNL 22GX3.5 QUINCKE BK (NEEDLE) ×3 IMPLANT
NS IRRIG 1000ML POUR BTL (IV SOLUTION) ×3 IMPLANT
PACK VAGINAL WOMENS (CUSTOM PROCEDURE TRAY) ×3 IMPLANT
SUT CHROMIC 0 CT 1 (SUTURE) IMPLANT
SUT CHROMIC 2 0 UR 5 27 (SUTURE) ×6 IMPLANT
SUT SILK 3 0 FS 1X18 (SUTURE) IMPLANT
SUT VIC AB 2-0 CT1 27 (SUTURE) ×6
SUT VIC AB 2-0 CT1 TAPERPNT 27 (SUTURE) ×2 IMPLANT
SUT VIC AB 2-0 SH 27 (SUTURE) ×3
SUT VIC AB 2-0 SH 27XBRD (SUTURE) ×1 IMPLANT
TOWEL OR 17X24 6PK STRL BLUE (TOWEL DISPOSABLE) ×6 IMPLANT
TRAY FOLEY CATH 14FR (SET/KITS/TRAYS/PACK) ×3 IMPLANT
WATER STERILE IRR 1000ML POUR (IV SOLUTION) ×3 IMPLANT

## 2014-12-16 NOTE — Anesthesia Postprocedure Evaluation (Signed)
  Anesthesia Post-op Note  Patient: Erin Carr  Procedure(s) Performed: Procedure(s) (LRB): ANTERIOR (CYSTOCELE) AND POSTERIOR REPAIR (RECTOCELE) (N/A)  Patient Location: PACU  Anesthesia Type: Spinal  Level of Consciousness: awake and alert   Airway and Oxygen Therapy: Patient Spontanous Breathing  Post-op Pain: mild  Post-op Assessment: Post-op Vital signs reviewed, Patient's Cardiovascular Status Stable, Respiratory Function Stable, Patent Airway and No signs of Nausea or vomiting  Last Vitals:  Filed Vitals:   12/16/14 1615  BP: 161/93  Pulse: 52  Temp: 36.4 C  Resp: 19    Post-op Vital Signs: stable   Complications: No apparent anesthesia complications

## 2014-12-16 NOTE — Anesthesia Procedure Notes (Signed)
Spinal Patient location during procedure: OR Preanesthetic Checklist Completed: patient identified, site marked, surgical consent, pre-op evaluation, timeout performed, IV checked, risks and benefits discussed and monitors and equipment checked Spinal Block Patient position: sitting Prep: DuraPrep Patient monitoring: heart rate, cardiac monitor, continuous pulse ox and blood pressure Approach: midline Location: L3-4 Injection technique: single-shot Needle Needle type: Sprotte  Needle gauge: 24 G Needle length: 9 cm Assessment Sensory level: T8 Additional Notes Spinal Dosage in OR  Bupivicaine ml       1.1

## 2014-12-16 NOTE — Anesthesia Preprocedure Evaluation (Signed)
Anesthesia Evaluation  Patient identified by MRN, date of birth, ID band Patient awake    Reviewed: Allergy & Precautions, H&P , Patient's Chart, lab work & pertinent test results  Airway Mallampati: II  TM Distance: >3 FB Neck ROM: full    Dental no notable dental hx. (+) Edentulous Upper   Pulmonary asthma , former smoker,  breath sounds clear to auscultation  Pulmonary exam normal       Cardiovascular Exercise Tolerance: Good hypertension, On Medications and On Home Beta Blockers Rhythm:regular Rate:Normal     Neuro/Psych    GI/Hepatic   Endo/Other  diabetes, Well Controlled, Type 2  Renal/GU      Musculoskeletal   Abdominal   Peds  Hematology   Anesthesia Other Findings   Reproductive/Obstetrics                             Anesthesia Physical Anesthesia Plan  ASA: III  Anesthesia Plan: Spinal   Post-op Pain Management:    Induction:   Airway Management Planned:   Additional Equipment:   Intra-op Plan:   Post-operative Plan:   Informed Consent: I have reviewed the patients History and Physical, chart, labs and discussed the procedure including the risks, benefits and alternatives for the proposed anesthesia with the patient or authorized representative who has indicated his/her understanding and acceptance.   Dental Advisory Given  Plan Discussed with: CRNA  Anesthesia Plan Comments: (Lab work confirmed with CRNA in room. Platelets okay. Discussed spinal anesthetic, and patient consents to the procedure:  included risk of possible headache,backache, failed block, allergic reaction, and nerve injury. This patient was asked if she had any questions or concerns before the procedure started. )        Anesthesia Quick Evaluation

## 2014-12-16 NOTE — Progress Notes (Signed)
Day of Surgery Procedure(s) (LRB): ANTERIOR (CYSTOCELE) AND POSTERIOR REPAIR (RECTOCELE) (N/A)  Subjective: Patient reports tolerating PO.    Objective: I have reviewed patient's vital signs and intake and output.  General: alert and cooperative Resp: clear to auscultation bilaterally Cardio: regular rate and rhythm GI: soft, non-tender; bowel sounds normal; no masses,  no organomegaly Extremities: extremities normal, atraumatic, no cyanosis or edema Vaginal Bleeding: minimal  Assessment: s/p Procedure(s): ANTERIOR (CYSTOCELE) AND POSTERIOR REPAIR (RECTOCELE) (N/A): stable  Plan: Advance diet blood pressure is elevated.  Pt just recieved her other meds.   LOS: 0 days    Erin Carr A 12/16/2014, 10:40 PM

## 2014-12-16 NOTE — Op Note (Signed)
Preoperative  diagnosis :cystocele and rectocele Postoperative diagnosis: Same Surgeon: [place] Assistant: Earnstine Regal PA Surgery: Anterior and posterior repair cystoscopy complications: None anasthesia: Spinal Estimated Blood Loss: 50 cc Urine output: 700 cc  Procedure in detail: Patient was taken to the operating room placed in dorsal lithotomy position and prepped and draped in sterile fashion.  Suppressant was placed along the anterior aspect of the cystocele.  A Metzenbaum scissors vaginal mucosa was opened in linear fashion from the bottom of the cystocele to about 1 cm below the urethra.  Sharp dissection the bladder mucosa was dissected away from the vaginal mucosa. The bladder was then plicated using 2-0 Vicryl on a UR 5 needle. The excess vaginal mucosa was excised using Mayo scissors. The remaining vaginal mucosa was reapproximated using 2-0 chromic in interrupted sutures.  Attention  was then turned to the posterior fourchette. The vaginal mucosa was infiltrated with vasopressin and separated in a similar fashion. Tissue  was plicated using 2-0 Vicryl. Excess vaginal mucosa was removed. Maintaining vaginal tissue was reapproximated using 2-0 chromic in interrupted fashion. Vicryl was also used to reapproximate the vaginal mucosa. Cystoscopy was then done in the normal fashion. Normal integrity. Suture was seen inside the bladder.  One Ureter was seen to efflux urine, the ureter cannot be identified. counts were correct patient went to recovery room in stable condition

## 2014-12-16 NOTE — H&P (View-Only) (Signed)
Erin Carr is a 81 y.o.  female G0 for anterior-posterior vaginal repair because of symptomatic pelvic prolapse.  Since 2012 the patient reports urinary urgency, incontinence and slowed bowel emptying that was initially treated with observation.   Over the past 7 months however, she  began using a pessary.  The device offered some relief however, it caused ulceration of her vaginal wall with accompanying granulation tissue. Patient admits to a recurrence of urinary incontinence, urgency and constipation once she had to discontinue the device. .  She denies any pelvic pain, vaginitis symptoms, hematuria, dysuria or lower back pain. The patient was placed on estrogen vaginal cream to improve  atrophic vaginal changes and to promote healing of the vaginal mucosa.  A review of both medical and surgical management options were given to the patient however, she has decided to proceed with surgical management of her pelvic prolapse.   Past Medical History  OB History: G0  GYN History: menarche: 81 YO    LMP: menopausal    Contracepton;   Denies history of abnormal PAP smear;   Last PAP smear: 2015-normal Medical History: Diabetes Mellitus, Hyperlipidemia, Hypertension, Asthma and Anemia  Surgical History: 1962 Hysterectomy/Appendectomy and Blood Transfusion Denies problems with anesthesia.  Family History: Diabetes Mellitus, Hypertension, Cardiovascular Disease, Prostate Cancer and "Female Organ" Cancer   Social History: Single;  Denies tobacco or alcohol use   Outpatient Encounter Prescriptions as of 12/07/2014  Medication Sig  . amLODipine (NORVASC) 10 MG tablet Take 10 mg by mouth daily.  Marland Kitchen aspirin 81 MG chewable tablet Chew 81 mg by mouth daily.  Marland Kitchen estradiol (ESTRACE) 0.1 MG/GM vaginal cream Place 1 Applicatorful vaginally 3 (three) times a week.  . furosemide (LASIX) 40 MG tablet Take 40 mg by mouth 2 (two) times daily.  Marland Kitchen gabapentin (NEURONTIN) 400 MG capsule Take 400 mg by mouth 2 (two)  times daily.  Marland Kitchen glipiZIDE (GLUCOTROL) 5 MG tablet Take 5 mg by mouth daily before breakfast.  . labetalol (NORMODYNE) 100 MG tablet Take 100 mg by mouth 2 (two) times daily.  Marland Kitchen lisinopril (PRINIVIL,ZESTRIL) 20 MG tablet Take 20 mg by mouth 2 (two) times daily.  . metFORMIN (GLUCOPHAGE) 500 MG tablet Take 500 mg by mouth 2 (two) times daily with a meal.  . potassium chloride (K-DUR) 10 MEQ tablet Take 10 mEq by mouth daily.  . propranolol (INDERAL) 10 MG tablet Take 10 mg by mouth 2 (two) times daily.  . simvastatin (ZOCOR) 20 MG tablet Take 20 mg by mouth daily.    No Known Allergies   Denies sensitivity to peanuts, shellfish, soy, latex or adhesives.   ROS: Admits to glasses, upper jaw dentures, constipation, shortness of breath only with asthma attacks and rare bilateral pedal edema;  Denies headache, vision changes, nasal congestion, dysphagia, tinnitus, dizziness, hoarseness, cough,  chest pain,  nausea, vomiting, diarrhea,  urinary frequency,  dysuria, hematuria, vaginitis symptoms, pelvic pain, swelling of joints,easy bruising,  myalgias, arthralgias, skin rashes, unexplained weight loss and except as is mentioned in the history of present illness, patient's review of systems is otherwise negative.  Physical Exam  B/P: 138/70   P: 64 bpm    R: 16 Weight: 155 lbs.  Height: 5'2.5 "  BMI: 27.9  Neck: supple without masses or thyromegaly Lungs: clear to auscultation Heart: regular rate and rhythm Abdomen: soft, non-tender and no organomegaly Pelvic:EGBUS- wnl; vagina-atrophic with 4/4 cystocele bulging from the vaginal opening with a 2/4 rectocele; uterus/cervix-surgically absent; adnexae-no tenderness or masses  Extremities:  no clubbing, cyanosis or edema   Assesment: Pelvic Prolapse                       Large Cystocele   Disposition:  A discussion was held with patient regarding the indication for her procedure(s) along with the risks, which include but are not limited to:  reaction to anesthesia, damage to adjacent organs, infection and excessive bleeding. The patient has decided to proceed with an Anterior Repair with Possible Posterior Repair at [facility] on December 16, 2014.  CSN# [id number]   Klover Priestly J. Florene Glen, PA-C  for Dr. Franklyn Lor. Dillard

## 2014-12-16 NOTE — Interval H&P Note (Signed)
History and Physical Interval Note:  12/16/2014 12:35 PM  Erin Carr  has presented today for surgery, with the diagnosis of Cystocele, Rectocele  The various methods of treatment have been discussed with the patient and family. After consideration of risks, benefits and other options for treatment, the patient has consented to  Procedure(s): ANTERIOR (CYSTOCELE) AND POSTERIOR REPAIR (RECTOCELE) (N/A) as a surgical intervention .  The patient's history has been reviewed, patient examined, no change in status, stable for surgery.  I have reviewed the patient's chart and labs.  Questions were answered to the patient's satisfaction.     [facility]

## 2014-12-16 NOTE — Transfer of Care (Signed)
Immediate Anesthesia Transfer of Care Note  Patient: Erin Carr  Procedure(s) Performed: Procedure(s): ANTERIOR (CYSTOCELE) AND POSTERIOR REPAIR (RECTOCELE) (N/A)  Patient Location: PACU  Anesthesia Type:Spinal  Level of Consciousness: awake, alert  and oriented  Airway & Oxygen Therapy: Patient Spontanous Breathing and Patient connected to nasal cannula oxygen  Post-op Assessment: Report given to PACU RN and Post -op Vital signs reviewed and stable  Post vital signs: Reviewed and stable  Complications: No apparent anesthesia complications

## 2014-12-17 ENCOUNTER — Encounter (HOSPITAL_COMMUNITY): Payer: Self-pay | Admitting: Obstetrics and Gynecology

## 2014-12-17 DIAGNOSIS — E785 Hyperlipidemia, unspecified: Secondary | ICD-10-CM | POA: Diagnosis not present

## 2014-12-17 DIAGNOSIS — N8110 Cystocele, unspecified: Secondary | ICD-10-CM | POA: Diagnosis not present

## 2014-12-17 DIAGNOSIS — N816 Rectocele: Secondary | ICD-10-CM | POA: Diagnosis not present

## 2014-12-17 DIAGNOSIS — E119 Type 2 diabetes mellitus without complications: Secondary | ICD-10-CM | POA: Diagnosis not present

## 2014-12-17 DIAGNOSIS — I10 Essential (primary) hypertension: Secondary | ICD-10-CM | POA: Diagnosis not present

## 2014-12-17 DIAGNOSIS — J45909 Unspecified asthma, uncomplicated: Secondary | ICD-10-CM | POA: Diagnosis not present

## 2014-12-17 LAB — BASIC METABOLIC PANEL
Anion gap: 5 (ref 5–15)
BUN: 9 mg/dL (ref 6–23)
CALCIUM: 8.5 mg/dL (ref 8.4–10.5)
CO2: 28 mmol/L (ref 19–32)
Chloride: 104 mEq/L (ref 96–112)
Creatinine, Ser: 0.81 mg/dL (ref 0.50–1.10)
GFR, EST AFRICAN AMERICAN: 76 mL/min — AB (ref >90)
GFR, EST NON AFRICAN AMERICAN: 66 mL/min — AB (ref >90)
GLUCOSE: 128 mg/dL — AB (ref 70–99)
POTASSIUM: 3.5 mmol/L (ref 3.5–5.1)
SODIUM: 137 mmol/L (ref 135–145)

## 2014-12-17 LAB — CBC WITH DIFFERENTIAL/PLATELET
Basophils Absolute: 0 10*3/uL (ref 0.0–0.1)
Basophils Relative: 0 % (ref 0–1)
Eosinophils Absolute: 0.2 10*3/uL (ref 0.0–0.7)
Eosinophils Relative: 2 % (ref 0–5)
HCT: 30.9 % — ABNORMAL LOW (ref 36.0–46.0)
Hemoglobin: 9.6 g/dL — ABNORMAL LOW (ref 12.0–15.0)
Lymphocytes Relative: 12 % (ref 12–46)
Lymphs Abs: 1.2 10*3/uL (ref 0.7–4.0)
MCH: 25.9 pg — AB (ref 26.0–34.0)
MCHC: 31.1 g/dL (ref 30.0–36.0)
MCV: 83.5 fL (ref 78.0–100.0)
MONO ABS: 0.9 10*3/uL (ref 0.1–1.0)
MONOS PCT: 9 % (ref 3–12)
Neutro Abs: 7.5 10*3/uL (ref 1.7–7.7)
Neutrophils Relative %: 77 % (ref 43–77)
Platelets: 151 10*3/uL (ref 150–400)
RBC: 3.7 MIL/uL — AB (ref 3.87–5.11)
RDW: 14.1 % (ref 11.5–15.5)
WBC: 9.8 10*3/uL (ref 4.0–10.5)

## 2014-12-17 LAB — GLUCOSE, CAPILLARY
Glucose-Capillary: 112 mg/dL — ABNORMAL HIGH (ref 70–99)
Glucose-Capillary: 183 mg/dL — ABNORMAL HIGH (ref 70–99)
Glucose-Capillary: 67 mg/dL — ABNORMAL LOW (ref 70–99)
Glucose-Capillary: 114 mg/dL — ABNORMAL HIGH (ref 70–99)
Glucose-Capillary: 132 mg/dL — ABNORMAL HIGH (ref 70–99)
Glucose-Capillary: 136 mg/dL — ABNORMAL HIGH (ref 70–99)

## 2014-12-17 MED ORDER — FUROSEMIDE 20 MG PO TABS
20.0000 mg | ORAL_TABLET | Freq: Once | ORAL | Status: AC
  Administered 2014-12-17: 20 mg via ORAL
  Filled 2014-12-17: qty 1

## 2014-12-17 MED ORDER — ACETAMINOPHEN 325 MG PO TABS
650.0000 mg | ORAL_TABLET | Freq: Four times a day (QID) | ORAL | Status: DC | PRN
  Administered 2014-12-17: 650 mg via ORAL
  Filled 2014-12-17: qty 2

## 2014-12-17 MED ORDER — ACETAMINOPHEN 325 MG PO TABS
650.0000 mg | ORAL_TABLET | Freq: Once | ORAL | Status: AC
  Administered 2014-12-17: 650 mg via ORAL
  Filled 2014-12-17: qty 2

## 2014-12-17 MED ORDER — BETHANECHOL CHLORIDE 25 MG PO TABS: 25.0000 mg | ORAL_TABLET | Freq: Four times a day (QID) | ORAL | Status: DC

## 2014-12-17 MED ORDER — LACTATED RINGERS IV SOLN
INTRAVENOUS | Status: DC
  Administered 2014-12-17 – 2014-12-18 (×3): via INTRAVENOUS

## 2014-12-17 MED ORDER — CEFAZOLIN SODIUM 1-5 GM-% IV SOLN
1.0000 g | Freq: Three times a day (TID) | INTRAVENOUS | Status: DC
  Administered 2014-12-17 – 2014-12-18 (×5): 1 g via INTRAVENOUS
  Filled 2014-12-17 (×6): qty 50

## 2014-12-17 NOTE — Addendum Note (Signed)
Addendum  created 12/17/14 0809 by Elenore Paddy, CRNA   Modules edited: Notes Section   Notes Section:  File: [id number]

## 2014-12-17 NOTE — Progress Notes (Signed)
1 Day Post-Op Procedure(s): ANTERIOR (CYSTOCELE) AND POSTERIOR REPAIR (RECTOCELE)  Subjective: Patient reports tolerating PO and + flatus.    Objective: BP 129/56 mmHg  Pulse 70  Temp(Src) 100.2 F (37.9 C) (Oral)  Resp 16  Ht 5' 2.5" (1.588 m)  Wt 155 lb (70.308 kg)  BMI 27.88 kg/m2  SpO2 98%  General: alert and cooperative GI: soft, non-tender; bowel sounds normal; no masses,  no organomegaly Extremities: extremities normal, atraumatic, no cyanosis or edema Vaginal Bleeding: minimal  Assessment: s/p Procedure(s): ANTERIOR (CYSTOCELE) AND POSTERIOR REPAIR (RECTOCELE): stable and post-op fever  Plan  Pt with urinary retetntion.  Foley placed and with bladder training.  Will dc in am and try urecholine if needed Fever.  Unknown source.  cxs pending.   Continue tylenol and abx Continue current care   LOS: 1 day    [place] A 12/17/2014, 3:57 PM

## 2014-12-17 NOTE — Anesthesia Postprocedure Evaluation (Signed)
  Anesthesia Post-op Note  Patient: Erin Carr  Procedure(s) Performed: Procedure(s): ANTERIOR (CYSTOCELE) AND POSTERIOR REPAIR (RECTOCELE) (N/A)  Patient Location: PACU and Women's Unit  Anesthesia Type:Spinal  Level of Consciousness: awake, alert  and oriented  Airway and Oxygen Therapy: Patient Spontanous Breathing  Post-op Pain: none  Post-op Assessment: Post-op Vital signs reviewed, Patient's Cardiovascular Status Stable, Respiratory Function Stable, Patent Airway, Adequate PO intake, Pain level controlled, No headache, No backache, No residual numbness and No residual motor weakness  Post-op Vital Signs: Reviewed and stable Fever during night, ancef started. Low BP and dizziness at around 0630 this morning. BP back up now and order written by surgeon to hold BP meds when BP under certain parameters.  Last Vitals:  Filed Vitals:   12/17/14 0728  BP: 115/37  Pulse: 59  Temp:   Resp:     Complications: No apparent anesthesia complications

## 2014-12-17 NOTE — Progress Notes (Signed)
Erin Carr is a84 y.o.  [id number]  Post Op Date # 1: Anterior-Posterior Repair Subjective: Patient is Doing well postoperatively. Patient has The patient is not having any pain. Ambulated last evening without difficulty but this morning was very unsteady. (may be due to anesthesia and/or blood pressure medication)  Had an episode early morning with chills that corresponded to a fever of 102.4 however her temperature  has since normalized.  Tolerating peanut butter crackers and coffee, denies nausea but hasn't voided since Foley was removed less than an hour ago.   Objective: Vital signs in last 24 hours: Temp:  [97.6 F (36.4 C)-102.4 F (39.1 C)] 99.2 F (37.3 C) (01/14 0547) Pulse Rate:  [51-72] 59 (01/14 0728) Resp:  [15-20] 16 (01/14 0639) BP: (87-175)/(37-93) 115/37 mmHg (01/14 0728) SpO2:  [95 %-100 %] 96 % (01/14 0639) Weight:  [155 lb (70.308 kg)] 155 lb (70.308 kg) (01/13 1745)  Intake/Output from previous day: 01/13 0701 - 01/14 0700 In: 3241.3 [P.O.:560; I.V.:2681.3] Out: 4325 [Urine:4275] Intake/Output this shift: Total I/O In: -  Out: 100 [Urine:100]  Recent Labs Lab 12/17/14 0310  WBC 9.8  HGB 9.6*  HCT 30.9*  PLT 151     Recent Labs Lab 12/17/14 0310  NA 137  K 3.5  CL 104  CO2 28  BUN 9  CREATININE 0.81  CALCIUM 8.5  GLUCOSE 128*    EXAM: General: alert, cooperative and no distress Resp: clear to auscultation bilaterally Cardio: regular rate and rhythm, S1, S2 normal, no murmur, click, rub or gallop GI: Bowel sounds present, soft without tenderness. Extremities: SCD hose in place and functioning; no calf tenderness.   Assessment: s/p Procedure(s): ANTERIOR (CYSTOCELE) AND POSTERIOR REPAIR (RECTOCELE): stable, post-op fever and anemia  Plan: Advance diet Encourage ambulation Hold Bp medications for now  Routine care  LOS: 1 day    Erin Baldi, PA-C 12/17/2014 7:53 AM

## 2014-12-18 DIAGNOSIS — E119 Type 2 diabetes mellitus without complications: Secondary | ICD-10-CM | POA: Diagnosis not present

## 2014-12-18 DIAGNOSIS — N816 Rectocele: Secondary | ICD-10-CM | POA: Diagnosis not present

## 2014-12-18 DIAGNOSIS — J45909 Unspecified asthma, uncomplicated: Secondary | ICD-10-CM | POA: Diagnosis not present

## 2014-12-18 DIAGNOSIS — N8110 Cystocele, unspecified: Secondary | ICD-10-CM | POA: Diagnosis not present

## 2014-12-18 DIAGNOSIS — E785 Hyperlipidemia, unspecified: Secondary | ICD-10-CM | POA: Diagnosis not present

## 2014-12-18 DIAGNOSIS — I10 Essential (primary) hypertension: Secondary | ICD-10-CM | POA: Diagnosis not present

## 2014-12-18 LAB — URINE CULTURE
COLONY COUNT: NO GROWTH
CULTURE: NO GROWTH
SPECIAL REQUESTS: NORMAL

## 2014-12-18 LAB — GLUCOSE, CAPILLARY
Glucose-Capillary: 153 mg/dL — ABNORMAL HIGH (ref 70–99)
Glucose-Capillary: 124 mg/dL — ABNORMAL HIGH (ref 70–99)

## 2014-12-18 MED ORDER — CIPROFLOXACIN HCL 250 MG PO TABS: 250.0000 mg | ORAL_TABLET | Freq: Two times a day (BID) | ORAL | Status: AC

## 2014-12-18 MED ORDER — HYDROCODONE-ACETAMINOPHEN 5-325 MG PO TABS: 1.0000 | ORAL_TABLET | Freq: Four times a day (QID) | ORAL | Status: DC | PRN

## 2014-12-18 MED ORDER — CEPHALEXIN 500 MG PO CAPS: 500.0000 mg | ORAL_CAPSULE | Freq: Four times a day (QID) | ORAL | Status: AC

## 2014-12-18 NOTE — Progress Notes (Signed)
Erin Carr is a84 y.o.  [id number]  Post Op Date # 2: Anterior-Posterior Repair/Cystoscopy  Subjective: Patient is Doing well postoperatively. Currently receiving bladder training due to inability to void on yesterday. Patient has The patient is not having any pain. Ambulating, voiding, tolerating a regular diet and feeling the urge to void approximately every 2-2.5 hours. Average drained after clamping = 470 cc  Objective: Vital signs in last 24 hours: Temp:  [98.6 F (37 C)-100.2 F (37.9 C)] 99.8 F (37.7 C) (01/15 0505) Pulse Rate:  [69-90] 72 (01/15 0505) Resp:  [16-18] 18 (01/15 0505) BP: (122-161)/(41-71) 158/56 mmHg (01/15 0505) SpO2:  [98 %-100 %] 98 % (01/15 0505)  Intake/Output from previous day: 01/14 0701 - 01/15 0700 In: 2048.3 [P.O.:440; I.V.:1608.3] Out: 3150 [Urine:3150] Intake/Output this shift:    Recent Labs Lab 12/17/14 0310  WBC 9.8  HGB 9.6*  HCT 30.9*  PLT 151     Recent Labs Lab 12/17/14 0310  NA 137  K 3.5  CL 104  CO2 28  BUN 9  CREATININE 0.81  CALCIUM 8.5  GLUCOSE 128*    EXAM: General: alert, cooperative and no distress Resp: clear to auscultation bilaterally Cardio: regular rate and rhythm, S1, S2 normal, no murmur, click, rub or gallop GI: Bowel sound present, soft Extremities: SCD hose in place and functioning; no calf tenderness. Vaginal Bleeding: minimal and dry stain.   Assessment: s/p Procedure(s): ANTERIOR (CYSTOCELE) AND POSTERIOR REPAIR (RECTOCELE): stable, progressing well, urinary retention and anemia  Plan: Continue bladder training  Routine care  LOS: 2 days    Maryum Batterson, PA-C 12/18/2014 7:51 AM

## 2014-12-18 NOTE — Discharge Summary (Signed)
Physician Discharge Summary  Patient ID: Erin Carr MRN: [id number] DOB/AGE: 09-Sep-1930 81 y.o.  Admit date: 12/16/2014 Discharge date: 12/18/2014   Discharge Diagnoses: Pelvic Prolapse Active Problems:   Pelvic prolapse   Operation:  Anterior and Posterior Repair with Cystoscopy   Discharged Condition: Good  Hospital Course: On the date of admission the patient underwent the aforementioned procedures and tolerated them well.  Post operative course was marked by a fever of 102.4 degrees F orally with a normal wbc and no apparent nidus of infection. Additionally the patient experienced urinary retention. At the time of discharge a urine culture and preliminary blood culture results revealed no bacteria growth. The patient's fever quickly defervesced within hours of peaking,  however, she was empirically given Ancef.  By post operative day #2, after bladder training the patient resumed bladder function along with bowel function and was therefore deemed ready for discharge home. Post operative hemoglobin was 9.6  (pre-op hemoglobin was 11.4).   Disposition: 01-Home or Self Care  Discharge Medications:    Medication List    STOP taking these medications        amLODipine 10 MG tablet  Commonly known as:  NORVASC     estradiol 0.1 MG/GM vaginal cream  Commonly known as:  ESTRACE     labetalol 100 MG tablet  Commonly known as:  NORMODYNE     lisinopril 20 MG tablet  Commonly known as:  PRINIVIL,ZESTRIL     metFORMIN 500 MG tablet  Commonly known as:  GLUCOPHAGE     propranolol 10 MG tablet  Commonly known as:  INDERAL      TAKE these medications        aspirin 81 MG chewable tablet  Chew 81 mg by mouth daily.     cephALEXin 500 MG capsule  Commonly known as:  KEFLEX  Take 1 capsule (500 mg total) by mouth 4 (four) times daily.     ciprofloxacin 250 MG tablet  Commonly known as:  CIPRO  Take 1 tablet (250 mg total) by mouth 2 (two) times daily.     furosemide  40 MG tablet  Commonly known as:  LASIX  Take 40 mg by mouth 2 (two) times daily.     gabapentin 400 MG capsule  Commonly known as:  NEURONTIN  Take 400 mg by mouth 2 (two) times daily.     glipiZIDE 5 MG tablet  Commonly known as:  GLUCOTROL  Take 5 mg by mouth daily before breakfast.     HYDROcodone-acetaminophen 5-325 MG per tablet  Commonly known as:  NORCO/VICODIN  Take 1 tablet by mouth every 6 (six) hours as needed for moderate pain.     potassium chloride 10 MEQ tablet  Commonly known as:  K-DUR  Take 10 mEq by mouth daily.     simvastatin 20 MG tablet  Commonly known as:  ZOCOR  Take 20 mg by mouth daily.         Follow-up: Dr. Gwynneth Munson A. Dillard, January 27, 2015 at 9:30 a.m.   SignedEarnstine Regal, PA-C 12/18/2014, 8:27 PM

## 2014-12-18 NOTE — Discharge Instructions (Signed)
Call [facility] @ [phone] if:  You have a temperature greater than or equal to 100.4 degrees Farenheit orally You have pain that is not made better by the pain medication given and taken as directed You have excessive bleeding or problems urinating  Take Colace (Docusate Sodium/Stool Softener) 100 mg 2-3 times daily while taking narcotic pain medicine to avoid constipation or until bowel movements are regular.  You may drive after 2 weeks You may walk up steps  You may shower  You may resume a regular diet   Do not lift over 15 pounds for 6 weeks Avoid anything in vagina for 6 weeks (or until after your post-operative visit) Hold all of your blood pressure medicines for one week  Anterior and Posterior Colporrhaphy Anterior or posterior colporrhaphy is surgery to fix a prolapse of organs in the genital tract. Prolapse means the falling down, bulging, dropping, or drooping of an organ. Organs that commonly prolapse include the rectum, bladder, vagina, and uterus. Prolapse can affect a single organ or several organs at the same time. This often worsens when women stop having their monthly periods (menopause) because estrogen loss weakens the muscles and tissues in the genital tract. In addition, prolapse happens when the organs are damaged or weakened. This commonly happens after childbirth and as a result of aging. Surgery is often done for severe prolapses.  The type of colporrhaphy done depends on the type of genital prolapse. Types of genital prolapse include the following:   Cystocele. This is a prolapse of the upper (anterior) wall of the vagina. The anterior wall bulges into the vagina and brings the bladder with it.   Rectocele. This is a prolapse of the lower (posterior) wall of the vagina. The posterior vaginal wall bulges into the vagina and brings the rectum with it.   Enterocele. This is a prolapse of part of the pelvic organs called the pouch of Douglas. It  also involves a portion of the small bowel. It appears as a bulge under the neck of the uterus at the top of the back wall of the vagina.   Procidentia. This is a complete prolapse of the uterus and the cervix. The prolapse can be seen and felt coming out of the vagina. LET [facility] CARE PROVIDER KNOW ABOUT:   Any allergies you have.   All medicines you are taking, including vitamins, herbs, eye drops, creams, and over-the-counter medicines.   Previous problems you or members of your family have had with the use of anesthetics.   Any blood disorders you have.   Previous surgeries you have had.   Medical conditions you have.   Smoking history or history of alcohol use.   Possibility of pregnancy, if this applies.  RISKS AND COMPLICATIONS Generally, anterior or posterior colporrhaphy is a safe procedure. However, as with any procedure, complications can occur. Possible complications include:   Infection.   Damage to other organs during surgery.   Bleeding after surgery.   Problems urinating.   Problems from the anesthetic.  BEFORE THE PROCEDURE  Ask your health care provider about changing or stopping your regular medicines.   Do not eat or drink anything for at least 8 hours before the surgery.   If you smoke, do not smoke for at least 2 weeks before the surgery.   Make plans to have someone drive you home after your hospital stay. Also, arrange for someone to help you with activities during recovery. PROCEDURE  You may be  given medicine to help you relax before the surgery (sedative). During the surgery you will be given medicine to make you sleep through the procedure (general anesthetic) or medicine to numb you from the waist down (spinal anesthetic). This medicine will be given through an intravenous (IV) access tube that is put into one of your veins.  The procedure will vary depending on the type of repair:   Anterior repair. A cut (incision) is  made in the midline section of the front part of the vaginal wall. A triangular-shaped piece of vaginal tissue is removed, and the stronger, healthier tissue is sewn together in order to support and suspend the bladder.   Posterior repair. An incision is made midline on the back wall of the vagina. A triangular portion of vaginal skin is removed to expose the muscle. Excess tissue is removed, and stronger, healthier muscle and ligament tissue is sewn together to support the rectum.   Anterior and posterior repair. Both procedures are done during the same surgery. AFTER THE PROCEDURE You will be taken to a recovery area. Your blood pressure, pulse, breathing, and temperature (vital signs) will be monitored. This is done until you are stable. Then you will be transferred to a hospital room.  After surgery, you will have a small rubber tube in place to drain your bladder (urinary catheter). This will be in place for 2 to 7 days or until your bladder is working properly on its own. The IV access tube will be removed in 1 to 3 days. You may have a gauze packing in your vagina to prevent bleeding. This will be removed 2 or 3 days after the surgery. You will likely need to stay in the hospital for 3 to 5 days.  Document Released: 02/10/2004 Document Revised: 07/23/2013 Document Reviewed: 04/11/2013 [facility] Patient Information 2015 [place], [place]. This information is not intended to replace advice given to you by your health care provider. Make sure you discuss any questions you have with your health care provider.

## 2014-12-18 NOTE — Progress Notes (Signed)
Pt discharged home with sister... Discharge instructions reviewed with pt and she verbalized understanding... Explained to pt to follow up with Dr. Charlesetta Garibaldi in one week, and hold all blood pressure medicine until then... Pt verbalized understanding... Condition stable... No equipment... Taken to car via wheelchair by C. Ovid Curd, [place].

## 2014-12-23 LAB — CULTURE, BLOOD (ROUTINE X 2)
CULTURE: NO GROWTH
Culture: NO GROWTH
Special Requests: 10
Special Requests: 10

## 2015-02-18 ENCOUNTER — Other Ambulatory Visit: Payer: Self-pay

## 2015-02-18 ENCOUNTER — Other Ambulatory Visit: Payer: Self-pay | Admitting: Pulmonary Disease

## 2015-02-18 DIAGNOSIS — J4521 Mild intermittent asthma with (acute) exacerbation: Secondary | ICD-10-CM | POA: Diagnosis not present

## 2015-02-18 DIAGNOSIS — E780 Pure hypercholesterolemia: Secondary | ICD-10-CM | POA: Diagnosis not present

## 2015-02-18 DIAGNOSIS — Z1239 Encounter for other screening for malignant neoplasm of breast: Secondary | ICD-10-CM

## 2015-02-18 DIAGNOSIS — E1165 Type 2 diabetes mellitus with hyperglycemia: Secondary | ICD-10-CM | POA: Diagnosis not present

## 2015-02-18 DIAGNOSIS — I119 Hypertensive heart disease without heart failure: Secondary | ICD-10-CM | POA: Diagnosis not present

## 2015-02-18 DIAGNOSIS — J449 Chronic obstructive pulmonary disease, unspecified: Secondary | ICD-10-CM | POA: Diagnosis not present

## 2015-02-18 DIAGNOSIS — Z79899 Other long term (current) drug therapy: Secondary | ICD-10-CM | POA: Diagnosis not present

## 2015-02-18 DIAGNOSIS — E559 Vitamin D deficiency, unspecified: Secondary | ICD-10-CM | POA: Diagnosis not present

## 2015-02-25 ENCOUNTER — Ambulatory Visit
Admission: RE | Admit: 2015-02-25 | Discharge: 2015-02-25 | Disposition: A | Discharge status: Home / Self Care | Payer: Medicare Other | Source: Ambulatory Visit | Attending: Pulmonary Disease | Admitting: Pulmonary Disease

## 2015-02-25 DIAGNOSIS — Z1231 Encounter for screening mammogram for malignant neoplasm of breast: Secondary | ICD-10-CM | POA: Diagnosis not present

## 2015-02-25 DIAGNOSIS — Z1239 Encounter for other screening for malignant neoplasm of breast: Secondary | ICD-10-CM

## 2015-06-01 DIAGNOSIS — R109 Unspecified abdominal pain: Secondary | ICD-10-CM | POA: Diagnosis not present

## 2015-06-01 DIAGNOSIS — R21 Rash and other nonspecific skin eruption: Secondary | ICD-10-CM | POA: Diagnosis not present

## 2015-06-10 DIAGNOSIS — R109 Unspecified abdominal pain: Secondary | ICD-10-CM | POA: Diagnosis not present

## 2015-06-15 DIAGNOSIS — R600 Localized edema: Secondary | ICD-10-CM | POA: Diagnosis not present

## 2015-06-15 DIAGNOSIS — Z79899 Other long term (current) drug therapy: Secondary | ICD-10-CM | POA: Diagnosis not present

## 2015-06-15 DIAGNOSIS — M79652 Pain in left thigh: Secondary | ICD-10-CM | POA: Diagnosis not present

## 2015-06-15 DIAGNOSIS — I119 Hypertensive heart disease without heart failure: Secondary | ICD-10-CM | POA: Diagnosis not present

## 2015-06-15 DIAGNOSIS — R911 Solitary pulmonary nodule: Secondary | ICD-10-CM | POA: Diagnosis not present

## 2015-06-15 DIAGNOSIS — E119 Type 2 diabetes mellitus without complications: Secondary | ICD-10-CM | POA: Diagnosis not present

## 2015-06-15 DIAGNOSIS — E780 Pure hypercholesterolemia: Secondary | ICD-10-CM | POA: Diagnosis not present

## 2015-06-15 DIAGNOSIS — J449 Chronic obstructive pulmonary disease, unspecified: Secondary | ICD-10-CM | POA: Diagnosis not present

## 2015-06-15 DIAGNOSIS — M159 Polyosteoarthritis, unspecified: Secondary | ICD-10-CM | POA: Diagnosis not present

## 2015-06-15 DIAGNOSIS — J4520 Mild intermittent asthma, uncomplicated: Secondary | ICD-10-CM | POA: Diagnosis not present

## 2015-06-21 ENCOUNTER — Other Ambulatory Visit (HOSPITAL_COMMUNITY): Payer: Self-pay | Admitting: Pulmonary Disease

## 2015-06-21 ENCOUNTER — Ambulatory Visit (HOSPITAL_COMMUNITY)
Admission: RE | Admit: 2015-06-21 | Discharge: 2015-06-21 | Disposition: A | Discharge status: Home / Self Care | Payer: Medicare Other | Source: Ambulatory Visit | Attending: Pulmonary Disease | Admitting: Pulmonary Disease

## 2015-06-21 DIAGNOSIS — Z8709 Personal history of other diseases of the respiratory system: Secondary | ICD-10-CM | POA: Diagnosis not present

## 2015-06-21 DIAGNOSIS — R911 Solitary pulmonary nodule: Secondary | ICD-10-CM

## 2015-06-21 DIAGNOSIS — M79605 Pain in left leg: Secondary | ICD-10-CM | POA: Diagnosis not present

## 2015-06-21 DIAGNOSIS — R05 Cough: Secondary | ICD-10-CM | POA: Insufficient documentation

## 2015-06-21 DIAGNOSIS — R918 Other nonspecific abnormal finding of lung field: Secondary | ICD-10-CM | POA: Diagnosis not present

## 2015-06-21 DIAGNOSIS — M419 Scoliosis, unspecified: Secondary | ICD-10-CM | POA: Insufficient documentation

## 2015-06-21 DIAGNOSIS — M25552 Pain in left hip: Secondary | ICD-10-CM | POA: Diagnosis not present

## 2015-06-21 DIAGNOSIS — M5136 Other intervertebral disc degeneration, lumbar region: Secondary | ICD-10-CM | POA: Insufficient documentation

## 2015-06-21 DIAGNOSIS — J45909 Unspecified asthma, uncomplicated: Secondary | ICD-10-CM | POA: Diagnosis not present

## 2015-06-29 ENCOUNTER — Ambulatory Visit (HOSPITAL_COMMUNITY): Payer: Medicare Other | Attending: Pulmonary Disease

## 2015-10-14 DIAGNOSIS — R259 Unspecified abnormal involuntary movements: Secondary | ICD-10-CM | POA: Diagnosis not present

## 2015-10-14 DIAGNOSIS — Z23 Encounter for immunization: Secondary | ICD-10-CM | POA: Diagnosis not present

## 2015-10-14 DIAGNOSIS — E7800 Pure hypercholesterolemia, unspecified: Secondary | ICD-10-CM | POA: Diagnosis not present

## 2015-10-14 DIAGNOSIS — E119 Type 2 diabetes mellitus without complications: Secondary | ICD-10-CM | POA: Diagnosis not present

## 2015-10-14 DIAGNOSIS — R911 Solitary pulmonary nodule: Secondary | ICD-10-CM | POA: Diagnosis not present

## 2015-10-14 DIAGNOSIS — M1990 Unspecified osteoarthritis, unspecified site: Secondary | ICD-10-CM | POA: Diagnosis not present

## 2015-10-14 DIAGNOSIS — I119 Hypertensive heart disease without heart failure: Secondary | ICD-10-CM | POA: Diagnosis not present

## 2015-10-14 DIAGNOSIS — Z111 Encounter for screening for respiratory tuberculosis: Secondary | ICD-10-CM | POA: Diagnosis not present

## 2015-10-14 DIAGNOSIS — E559 Vitamin D deficiency, unspecified: Secondary | ICD-10-CM | POA: Diagnosis not present

## 2015-10-14 DIAGNOSIS — J449 Chronic obstructive pulmonary disease, unspecified: Secondary | ICD-10-CM | POA: Diagnosis not present

## 2015-10-14 DIAGNOSIS — J4520 Mild intermittent asthma, uncomplicated: Secondary | ICD-10-CM | POA: Diagnosis not present

## 2015-10-14 DIAGNOSIS — Z79899 Other long term (current) drug therapy: Secondary | ICD-10-CM | POA: Diagnosis not present

## 2015-10-22 ENCOUNTER — Ambulatory Visit (HOSPITAL_COMMUNITY): Admission: RE | Admit: 2015-10-22 | Payer: Medicare Other | Source: Ambulatory Visit

## 2015-11-03 ENCOUNTER — Ambulatory Visit (HOSPITAL_COMMUNITY)
Admission: RE | Admit: 2015-11-03 | Discharge: 2015-11-03 | Disposition: A | Discharge status: Home / Self Care | Payer: Medicare Other | Source: Ambulatory Visit | Attending: Pulmonary Disease | Admitting: Pulmonary Disease

## 2015-11-03 ENCOUNTER — Encounter (HOSPITAL_COMMUNITY): Payer: Self-pay

## 2015-11-03 DIAGNOSIS — R911 Solitary pulmonary nodule: Secondary | ICD-10-CM | POA: Diagnosis not present

## 2015-11-03 DIAGNOSIS — R918 Other nonspecific abnormal finding of lung field: Secondary | ICD-10-CM | POA: Diagnosis not present

## 2015-11-03 DIAGNOSIS — I700 Atherosclerosis of aorta: Secondary | ICD-10-CM | POA: Insufficient documentation

## 2015-11-03 DIAGNOSIS — I2510 Atherosclerotic heart disease of native coronary artery without angina pectoris: Secondary | ICD-10-CM | POA: Diagnosis not present

## 2015-11-03 LAB — POCT I-STAT CREATININE: Creatinine, Ser: 0.8 mg/dL (ref 0.44–1.00)

## 2015-11-03 MED ORDER — IOHEXOL 300 MG/ML  SOLN
75.0000 mL | Freq: Once | INTRAMUSCULAR | Status: AC | PRN
  Administered 2015-11-03: 75 mL via INTRAVENOUS

## 2015-11-04 DIAGNOSIS — R7611 Nonspecific reaction to tuberculin skin test without active tuberculosis: Secondary | ICD-10-CM

## 2015-11-04 HISTORY — DX: Nonspecific reaction to tuberculin skin test without active tuberculosis: R76.11

## 2015-11-08 ENCOUNTER — Other Ambulatory Visit (HOSPITAL_COMMUNITY): Payer: Self-pay | Admitting: Pulmonary Disease

## 2015-11-08 DIAGNOSIS — R911 Solitary pulmonary nodule: Secondary | ICD-10-CM

## 2015-11-15 DIAGNOSIS — I119 Hypertensive heart disease without heart failure: Secondary | ICD-10-CM | POA: Diagnosis not present

## 2015-11-15 DIAGNOSIS — E119 Type 2 diabetes mellitus without complications: Secondary | ICD-10-CM | POA: Diagnosis not present

## 2015-11-15 DIAGNOSIS — Z79899 Other long term (current) drug therapy: Secondary | ICD-10-CM | POA: Diagnosis not present

## 2015-11-15 DIAGNOSIS — E7800 Pure hypercholesterolemia, unspecified: Secondary | ICD-10-CM | POA: Diagnosis not present

## 2015-11-15 DIAGNOSIS — J4520 Mild intermittent asthma, uncomplicated: Secondary | ICD-10-CM | POA: Diagnosis not present

## 2015-11-15 DIAGNOSIS — M1990 Unspecified osteoarthritis, unspecified site: Secondary | ICD-10-CM | POA: Diagnosis not present

## 2015-11-15 DIAGNOSIS — J410 Simple chronic bronchitis: Secondary | ICD-10-CM | POA: Diagnosis not present

## 2015-11-15 DIAGNOSIS — D491 Neoplasm of unspecified behavior of respiratory system: Secondary | ICD-10-CM | POA: Diagnosis not present

## 2015-11-15 DIAGNOSIS — R7611 Nonspecific reaction to tuberculin skin test without active tuberculosis: Secondary | ICD-10-CM | POA: Diagnosis not present

## 2015-11-15 DIAGNOSIS — E559 Vitamin D deficiency, unspecified: Secondary | ICD-10-CM | POA: Diagnosis not present

## 2015-11-18 ENCOUNTER — Other Ambulatory Visit (HOSPITAL_COMMUNITY)
Admission: RE | Admit: 2015-11-18 | Discharge: 2015-11-18 | Disposition: A | Discharge status: Home / Self Care | Payer: Medicare Other | Source: Ambulatory Visit | Attending: Pulmonary Disease | Admitting: Pulmonary Disease

## 2015-11-18 ENCOUNTER — Encounter (HOSPITAL_COMMUNITY)
Admission: RE | Admit: 2015-11-18 | Discharge: 2015-11-18 | Disposition: A | Discharge status: Home / Self Care | Payer: Medicare Other | Source: Ambulatory Visit | Attending: Pulmonary Disease | Admitting: Pulmonary Disease

## 2015-11-18 ENCOUNTER — Encounter (HOSPITAL_COMMUNITY): Payer: Self-pay

## 2015-11-18 DIAGNOSIS — R911 Solitary pulmonary nodule: Secondary | ICD-10-CM | POA: Diagnosis not present

## 2015-11-18 LAB — GLUCOSE, CAPILLARY: GLUCOSE-CAPILLARY: 115 mg/dL — AB (ref 65–99)

## 2015-11-18 MED ORDER — FLUDEOXYGLUCOSE F - 18 (FDG) INJECTION
7.7500 | Freq: Once | INTRAVENOUS | Status: AC | PRN
  Administered 2015-11-18: 7.75 via INTRAVENOUS

## 2015-11-22 LAB — QUANTIFERON IN TUBE
QFT TB AG MINUS NIL VALUE: 0.16 [IU]/mL
QUANTIFERON TB AG VALUE: 0.17 IU/mL
QUANTIFERON TB GOLD: NEGATIVE
Quantiferon Nil Value: 0.01 IU/mL

## 2015-11-22 LAB — QUANTIFERON TB GOLD ASSAY (BLOOD)

## 2015-11-30 ENCOUNTER — Encounter: Payer: Medicare Other | Admitting: Cardiothoracic Surgery

## 2015-12-01 ENCOUNTER — Encounter: Payer: Medicare Other | Admitting: Cardiothoracic Surgery

## 2015-12-01 ENCOUNTER — Encounter: Payer: Self-pay | Admitting: *Deleted

## 2015-12-01 DIAGNOSIS — R7611 Nonspecific reaction to tuberculin skin test without active tuberculosis: Secondary | ICD-10-CM | POA: Insufficient documentation

## 2015-12-01 DIAGNOSIS — D381 Neoplasm of uncertain behavior of trachea, bronchus and lung: Secondary | ICD-10-CM

## 2015-12-02 ENCOUNTER — Other Ambulatory Visit: Payer: Self-pay | Admitting: *Deleted

## 2015-12-02 ENCOUNTER — Encounter: Payer: Self-pay | Admitting: Cardiothoracic Surgery

## 2015-12-02 ENCOUNTER — Institutional Professional Consult (permissible substitution) (INDEPENDENT_AMBULATORY_CARE_PROVIDER_SITE_OTHER): Payer: Medicare Other | Admitting: Cardiothoracic Surgery

## 2015-12-02 VITALS — BP 130/64 | HR 62 | Resp 20 | Ht 62.5 in | Wt 155.0 lb

## 2015-12-02 DIAGNOSIS — R911 Solitary pulmonary nodule: Secondary | ICD-10-CM | POA: Diagnosis not present

## 2015-12-02 NOTE — Progress Notes (Signed)
PCP is Leola Brazil, MD Referring Provider is Vincente Liberty, MD  Chief Complaint  Patient presents with  . Lung Lesion    Surgical eval, PET Scan 11/18/15, Chest CT 11/03/15  patient examined, CT scans of chest and PET scan personally reviewed and counseled with patient  HPI:  81 year old AA female reformed smoker presents for evaluation of a 2.5 cm infiltrated density in the posterior segment right upper lobe. She's had an abnormality in this area on her chest x-ray for the past 3-4 years. It is asymptomatic. She has a dry chronic cough. She has history of asthma but no significant or recurrent pneumonias. She stopped smoking over 15 years ago. No history of TB or immunosuppression.  Because of the abnormality on plain chest x-ray CT scan was performed which clearly demonstrated the infiltrated density. This appeared inflammatory. It had been present on a CT scan 3-4 years ago but was smaller at that time. There is mediastinal adenopathy but less than 1 cm.  Followup PET scan showed the primary right upper lobe infiltrated density to have an SUV of 1.9  -    the small right paratracheal node had a SUV of 4.3  The patient denies weight loss feverOr hemoptysis. She denies bone pain headache change in neuro status. She lives by herself and takes care of her home but does not drive. There's no family history of lung cancer. The patient is retired from working at the [employer]  Past Medical History  Diagnosis Date  . Hypertension   . Diabetes mellitus without complication ([place])   . Hypercholesteremia   . Asthma   . Shortness of breath dyspnea   . Tremors of nervous system     Left hand  . COPD (chronic obstructive pulmonary disease) ([place])   . Osteoporosis   . Vitamin D deficiency   . Positive PPD 12/16    PER PCP    Past Surgical History  Procedure Laterality Date  . Abdominal hysterectomy    . Bowel resection    . Colonoscopy    . Anterior and  posterior repair N/A 12/16/2014    Procedure: ANTERIOR (CYSTOCELE) AND POSTERIOR REPAIR (RECTOCELE);  Surgeon: Crawford Givens, MD;  Location: [facility];  Service: Gynecology;  Laterality: N/A;    Family History  Problem Relation Age of Onset  . Hypertension Father   . Diabetes Father     Social History Social History  Substance Use Topics  . Smoking status: Former Smoker -- 0.50 packs/day for 40 years    Types: Cigarettes  . Smokeless tobacco: Never Used  . Alcohol Use: No    Current Outpatient Prescriptions  Medication Sig Dispense Refill  . albuterol (PROVENTIL HFA;VENTOLIN HFA) 108 (90 Base) MCG/ACT inhaler Inhale 1 puff into the lungs every 4 (four) hours as needed for wheezing or shortness of breath.    Marland Kitchen amLODipine (NORVASC) 10 MG tablet Take 10 mg by mouth daily.    . budesonide (PULMICORT) 180 MCG/ACT inhaler Inhale into the lungs 2 (two) times daily.    . ergocalciferol (VITAMIN D2) 50000 units capsule Take 50,000 Units by mouth once a week.    . furosemide (LASIX) 40 MG tablet Take 40 mg by mouth 2 (two) times daily.    Marland Kitchen gabapentin (NEURONTIN) 400 MG capsule Take 400 mg by mouth 2 (two) times daily.    Marland Kitchen glipiZIDE (GLUCOTROL) 5 MG tablet Take 5 mg by mouth daily before breakfast.    . labetalol (NORMODYNE) 100  MG tablet Take 100 mg by mouth 2 (two) times daily.    Marland Kitchen lisinopril (PRINIVIL,ZESTRIL) 20 MG tablet Take 20 mg by mouth 2 (two) times daily.    . metFORMIN (GLUCOPHAGE) 500 MG tablet Take 500 mg by mouth 2 (two) times daily with a meal.    . potassium chloride (K-DUR) 10 MEQ tablet Take 10 mEq by mouth daily.    . propranolol (INDERAL) 10 MG tablet Take 10 mg by mouth every 12 (twelve) hours.    . simvastatin (ZOCOR) 20 MG tablet Take 20 mg by mouth at bedtime.      No current facility-administered medications for this visit.    No Known Allergies  Review of Systems         Review of Systems :  [ y ] = yes, [  ] = no        General :  Weight gain [   ]     Weight loss  [   ]  Fatigue [  ]  Fever [  ]  Chills  [  ]                                Weakness  [  ]           Cardiac :  Chest pain/ pressure [no  ]  Resting SOB [ no ] exertional SOB [ yes ]                        Orthopnea [  ]  Pedal edema  [  ]  Palpitations [  ] Syncope/presyncope '[ ]'$                         Paroxysmal nocturnal dyspnea [  ]        Pulmonary : cough [  ]  wheezing [yes history of asthma  ]  Hemoptysis [  ] Sputum [  ] Snoring [  ] not on home oxygen                              Pneumothorax [  ]  Sleep apnea [  ]       GI : Vomiting [  ]  Dysphagia [  ]  Melena  [  ]  Abdominal pain [  ] BRBPR [  ]              Heart burn [  ]  Constipation [  ] Diarrhea  [  ] Colonoscopy [  ]       GU : Hematuria [  ]  Dysuria [  ]  Nocturia [  ] UTI's [  ]       Vascular : Claudication [  ]  Rest pain [  ]  DVT [  ] Vein stripping [  ] leg ulcers [  ]                          TIA [  ] Stroke [  ]  Varicose veins [  ]       NEURO :  Headaches  [  ] Seizures [  ] Vision changes [  ] Paresthesias [  ] positive history tremor  Musculoskeletal :  Arthritis [ yes ] Gout  [  ]  Back pain [  ]  Joint pain [  ] uses a cane to walk       Skin :  Rash [  ]  Melanoma [  ]        Heme : Bleeding problems [  ]Clotting Disorders [  ] Anemia [  ]Blood Transfusion '[ ]'$        Endocrine : Diabetes [yes on oral meds  ] Thyroid Disorder  [  ]       Psych : Depression [  ]  Anxiety [  ]  Psych hospitalizations [  ]                                               BP 130/64 mmHg  Pulse 62  Resp 20  Ht 5' 2.5" (1.588 m)  Wt 155 lb (70.308 kg)  BMI 27.88 kg/m2  SpO2 98% Physical Exam      Physical Exam  General: Elderly frail AA female accompanied by her cousin no acute distress HEENT: Normocephalic pupils equal , dentition -full dental plates Neck: Supple without JVD, adenopathy, or bruit Chest: Clear to auscultation, symmetrical breath sounds, no rhonchi, no tenderness             or  deformity. Very poor mechanics of breathing.   After ambulation of 100 feet her room air saturation remained at 97% Cardiovascular: Regular rate and rhythm, no murmur, no gallop, peripheral pulses             palpable in all extremities Abdomen:  Soft, nontender, no palpable mass or organomegaly Extremities: Warm, well-perfused, no clubbing cyanosis edema or tenderness,              no venous stasis changes of the legs Rectal/GU: Deferred Neuro: Grossly non--focal and symmetrical throughout Skin: Clean and dry without rash or ulceration   Diagnostic Tests: CT scan shows a infiltrative  density in the right upper lobe No enlarged mediastinal nodes  PET scan shows mild hypermetabolic activity of the right upper lobe density--SUV 1.9. The small lower right paratracheal node has SUV of 4.1.  Impression: Right upper lobe density with slow growth on the past 3-4 years. Minimal mediastinal adenopathy but with increased activity on PET scan.  We'll proceed with a transthoracic CT-guided needle biopsy of the primary lesion. If that is positive for cancer I would recommend bronchoscopy and transbronchial ultrasound guided biopsy of the 4R lymph node. At this point the patient does not appear to be a candidate for surgical resection of the lung lesion if it was malignant.  -   she may benefit from radiation therapy-stereotactic body radiation therapy if she has less than stage III disease  Plan: Proceed with needle biopsy of primary lung mass and followup for discussion.  Len Childs, MD [facility] [phone]

## 2015-12-07 ENCOUNTER — Other Ambulatory Visit: Payer: Self-pay | Admitting: *Deleted

## 2015-12-07 DIAGNOSIS — R911 Solitary pulmonary nodule: Secondary | ICD-10-CM

## 2015-12-09 ENCOUNTER — Encounter (HOSPITAL_COMMUNITY)
Admission: RE | Admit: 2015-12-09 | Discharge: 2015-12-09 | Disposition: A | Discharge status: Home / Self Care | Payer: Medicare Other | Source: Ambulatory Visit | Attending: Cardiothoracic Surgery | Admitting: Cardiothoracic Surgery

## 2015-12-09 ENCOUNTER — Encounter (HOSPITAL_COMMUNITY): Payer: Self-pay

## 2015-12-09 VITALS — BP 158/64 | HR 66 | Temp 98.4°F | Resp 20 | Ht 62.5 in | Wt 155.0 lb

## 2015-12-09 DIAGNOSIS — Z01812 Encounter for preprocedural laboratory examination: Secondary | ICD-10-CM | POA: Diagnosis not present

## 2015-12-09 DIAGNOSIS — R911 Solitary pulmonary nodule: Secondary | ICD-10-CM | POA: Insufficient documentation

## 2015-12-09 DIAGNOSIS — E119 Type 2 diabetes mellitus without complications: Secondary | ICD-10-CM | POA: Insufficient documentation

## 2015-12-09 DIAGNOSIS — Z01818 Encounter for other preprocedural examination: Secondary | ICD-10-CM | POA: Insufficient documentation

## 2015-12-09 HISTORY — DX: Family history of other specified conditions: Z84.89

## 2015-12-09 HISTORY — DX: Frequency of micturition: R35.0

## 2015-12-09 HISTORY — DX: Unspecified osteoarthritis, unspecified site: M19.90

## 2015-12-09 HISTORY — DX: Presence of spectacles and contact lenses: Z97.3

## 2015-12-09 LAB — PROTIME-INR
INR: 1.1 (ref 0.00–1.49)
Prothrombin Time: 14.4 seconds (ref 11.6–15.2)

## 2015-12-09 LAB — CBC
HCT: 36.2 % (ref 36.0–46.0)
Hemoglobin: 11.2 g/dL — ABNORMAL LOW (ref 12.0–15.0)
MCH: 25.4 pg — ABNORMAL LOW (ref 26.0–34.0)
MCHC: 30.9 g/dL (ref 30.0–36.0)
MCV: 82.1 fL (ref 78.0–100.0)
Platelets: 207 10*3/uL (ref 150–400)
RBC: 4.41 MIL/uL (ref 3.87–5.11)
RDW: 15.6 % — ABNORMAL HIGH (ref 11.5–15.5)
WBC: 8.2 10*3/uL (ref 4.0–10.5)

## 2015-12-09 LAB — COMPREHENSIVE METABOLIC PANEL
ALT: 13 U/L — ABNORMAL LOW (ref 14–54)
AST: 20 U/L (ref 15–41)
Albumin: 4.2 g/dL (ref 3.5–5.0)
Alkaline Phosphatase: 86 U/L (ref 38–126)
Anion gap: 10 (ref 5–15)
BUN: 15 mg/dL (ref 6–20)
CO2: 27 mmol/L (ref 22–32)
Calcium: 10 mg/dL (ref 8.9–10.3)
Chloride: 104 mmol/L (ref 101–111)
Creatinine, Ser: 0.84 mg/dL (ref 0.44–1.00)
GFR calc Af Amer: >60 mL/min (ref >60)
GFR calc non Af Amer: >60 mL/min (ref >60)
Glucose, Bld: 119 mg/dL — ABNORMAL HIGH (ref 65–99)
Potassium: 4.2 mmol/L (ref 3.5–5.1)
Sodium: 141 mmol/L (ref 135–145)
Total Bilirubin: 0.2 mg/dL — ABNORMAL LOW (ref 0.3–1.2)
Total Protein: 7.4 g/dL (ref 6.5–8.1)

## 2015-12-09 LAB — GLUCOSE, CAPILLARY: Glucose-Capillary: 133 mg/dL — ABNORMAL HIGH (ref 65–99)

## 2015-12-09 LAB — APTT: aPTT: 32 seconds (ref 24–37)

## 2015-12-09 NOTE — Progress Notes (Signed)
PCP - Dr. Vincente Liberty Cardiologist - denies  EKG - 12/09/15 CXR- DOS  Echo/stress test/cardiaccath - denies  Patient denies chest pain and shortness of breath at PAT appointment.    Patient states that she does not check her blood sugars at home.  Patient encouraged to check blood sugar 4 times a day, 2 days prior to surgery.

## 2015-12-09 NOTE — Pre-Procedure Instructions (Addendum)
Erin Carr  12/09/2015      [facility] - [place] ([place]), [place] [address] [place] ([place]) [place] [address] Phone: [phone] Fax: [phone]    Your procedure is scheduled on Tuesday, January 10th, 2017.  Report to [facility] Admitting at 12:30 P.M.  Call this number if you have problems the morning of surgery:  [phone]   Remember:  Do not eat food or drink liquids after midnight.   Take these medicines the morning of surgery with A SIP OF WATER: Albuterol inhaler if needed (please bring with you), Amlodipine (Norvasc), Gabapentin (Neurontin), Labetalol (Normodyne), Propranolol (Inderal).  Please stop taking: Aspirin, NSAIDS, Aleve, Naproxen, Ibuprofen, Advil, Motrin, BC's, Goody's, Fish oil, all herbal medications, and all vitamins.   What do I do about my diabetes medications?   Do not take oral diabetes medicines (pills) the morning of surgery.  Day of surgery, do NOT take Glipizide (Glucotrol) or Metformin (Glucophage).      Do not wear jewelry, make-up or nail polish.  Do not wear lotions, powders, or perfumes.  You may wear deodorant.  Do not shave 48 hours prior to surgery.    Do not bring valuables to the hospital.  [facility] is not responsible for any belongings or valuables.  Contacts, dentures or bridgework may not be worn into surgery.  Leave your suitcase in the car.  After surgery it may be brought to your room.  For patients admitted to the hospital, discharge time will be determined by your treatment team.  Patients discharged the day of surgery will not be allowed to drive home.   Special instructions:  See attached.   Please read over the following fact sheets that you were given. Pain Booklet, Coughing and Deep Breathing and Surgical Site Infection Prevention    How to Manage Your Diabetes Before Surgery   Why is it important to control my blood sugar before and after  surgery?   Improving blood sugar levels before and after surgery helps healing and can limit problems.  A way of improving blood sugar control is eating a healthy diet by:  - Eating less sugar and carbohydrates  - Increasing activity/exercise  - Talk with your doctor about reaching your blood sugar goals  High blood sugars (greater than 180 mg/dL) can raise your risk of infections and slow down your recovery so you will need to focus on controlling your diabetes during the weeks before surgery.  Make sure that the doctor who takes care of your diabetes knows about your planned surgery including the date and location.  How do I manage my blood sugars before surgery?   Check your blood sugar at least 4 times a day, 2 days before surgery to make sure that they are not too high or low.   Check your blood sugar the morning of your surgery when you wake up and every 2 hours until you get to the Short-Stay unit.  If your blood sugar is less than 70 mg/dL, you will need to treat for low blood sugar by:  Treat a low blood sugar (less than 70 mg/dL) with 1/2 cup of clear juice (cranberry or apple), 4 glucose tablets, OR glucose gel.  Recheck blood sugar in 15 minutes after treatment (to make sure it is greater than 70 mg/dL).  If blood sugar is not greater than 70 mg/dL on re-check, call [phone] for further instructions.   Report your blood  sugar to the Short-Stay nurse when you get to Short-Stay.  References:  [facility], 2007 "How to Manage your Diabetes Before and After Surgery".

## 2015-12-10 LAB — HEMOGLOBIN A1C
Hgb A1c MFr Bld: 6.6 % — ABNORMAL HIGH (ref 4.8–5.6)
Mean Plasma Glucose: 143 mg/dL

## 2015-12-14 ENCOUNTER — Ambulatory Visit (HOSPITAL_COMMUNITY)
Admission: RE | Admit: 2015-12-14 | Discharge: 2015-12-14 | Disposition: A | Discharge status: Home / Self Care | Payer: Medicare Other | Source: Ambulatory Visit | Attending: Cardiothoracic Surgery | Admitting: Cardiothoracic Surgery

## 2015-12-14 ENCOUNTER — Encounter (HOSPITAL_COMMUNITY)
Admission: RE | Disposition: A | Discharge status: Home / Self Care | Payer: Self-pay | Source: Ambulatory Visit | Attending: Cardiothoracic Surgery

## 2015-12-14 ENCOUNTER — Ambulatory Visit (HOSPITAL_COMMUNITY): Payer: Medicare Other | Admitting: Certified Registered Nurse Anesthetist

## 2015-12-14 ENCOUNTER — Other Ambulatory Visit: Payer: Self-pay | Admitting: *Deleted

## 2015-12-14 ENCOUNTER — Ambulatory Visit (HOSPITAL_COMMUNITY): Payer: Medicare Other

## 2015-12-14 ENCOUNTER — Encounter (HOSPITAL_COMMUNITY): Payer: Self-pay | Admitting: Certified Registered Nurse Anesthetist

## 2015-12-14 DIAGNOSIS — R911 Solitary pulmonary nodule: Secondary | ICD-10-CM

## 2015-12-14 DIAGNOSIS — M1990 Unspecified osteoarthritis, unspecified site: Secondary | ICD-10-CM | POA: Insufficient documentation

## 2015-12-14 DIAGNOSIS — Z539 Procedure and treatment not carried out, unspecified reason: Secondary | ICD-10-CM | POA: Diagnosis not present

## 2015-12-14 DIAGNOSIS — E119 Type 2 diabetes mellitus without complications: Secondary | ICD-10-CM | POA: Insufficient documentation

## 2015-12-14 DIAGNOSIS — I10 Essential (primary) hypertension: Secondary | ICD-10-CM | POA: Diagnosis not present

## 2015-12-14 DIAGNOSIS — J449 Chronic obstructive pulmonary disease, unspecified: Secondary | ICD-10-CM | POA: Insufficient documentation

## 2015-12-14 DIAGNOSIS — Z87891 Personal history of nicotine dependence: Secondary | ICD-10-CM | POA: Insufficient documentation

## 2015-12-14 DIAGNOSIS — J45909 Unspecified asthma, uncomplicated: Secondary | ICD-10-CM | POA: Diagnosis not present

## 2015-12-14 LAB — GLUCOSE, CAPILLARY: Glucose-Capillary: 136 mg/dL — ABNORMAL HIGH (ref 65–99)

## 2015-12-14 SURGERY — BRONCHOSCOPY, WITH EBUS
Anesthesia: General

## 2015-12-14 MED ORDER — LIDOCAINE HCL (CARDIAC) 20 MG/ML IV SOLN
INTRAVENOUS | Status: AC
  Filled 2015-12-14: qty 5

## 2015-12-14 MED ORDER — PHENYLEPHRINE 40 MCG/ML (10ML) SYRINGE FOR IV PUSH (FOR BLOOD PRESSURE SUPPORT)
PREFILLED_SYRINGE | INTRAVENOUS | Status: AC
  Filled 2015-12-14: qty 10

## 2015-12-14 MED ORDER — ROCURONIUM BROMIDE 50 MG/5ML IV SOLN
INTRAVENOUS | Status: AC
  Filled 2015-12-14: qty 1

## 2015-12-14 MED ORDER — PROPOFOL 10 MG/ML IV BOLUS
INTRAVENOUS | Status: AC
  Filled 2015-12-14: qty 20

## 2015-12-14 MED ORDER — EPHEDRINE SULFATE 50 MG/ML IJ SOLN
INTRAMUSCULAR | Status: AC
  Filled 2015-12-14: qty 1

## 2015-12-14 MED ORDER — ROCURONIUM BROMIDE 50 MG/5ML IV SOLN
INTRAVENOUS | Status: AC
  Filled 2015-12-14: qty 2

## 2015-12-14 MED ORDER — LACTATED RINGERS IV SOLN
INTRAVENOUS | Status: DC
  Administered 2015-12-14: 13:00:00 via INTRAVENOUS

## 2015-12-14 MED ORDER — FENTANYL CITRATE (PF) 250 MCG/5ML IJ SOLN
INTRAMUSCULAR | Status: AC
  Filled 2015-12-14: qty 5

## 2015-12-14 MED ORDER — SUCCINYLCHOLINE CHLORIDE 20 MG/ML IJ SOLN
INTRAMUSCULAR | Status: AC
Start: 2015-12-14 — End: 2015-12-14
  Filled 2015-12-14: qty 1

## 2015-12-14 MED ORDER — ONDANSETRON HCL 4 MG/2ML IJ SOLN
INTRAMUSCULAR | Status: AC
  Filled 2015-12-14: qty 2

## 2015-12-14 NOTE — Progress Notes (Addendum)
Dr. Nils Pyle notified patient of cancellation and give follow- up instructions. IV removed. Patient given [place] sandwich and drink prior to discharge.

## 2015-12-14 NOTE — Anesthesia Preprocedure Evaluation (Deleted)
Anesthesia Evaluation  Patient identified by MRN, date of birth, ID band Patient awake    Reviewed: Allergy & Precautions, NPO status , Patient's Chart, lab work & pertinent test results, reviewed documented beta blocker date and time   Airway Mallampati: II  TM Distance: >3 FB Neck ROM: Limited    Dental  (+) Lower Dentures, Upper Dentures   Pulmonary shortness of breath, asthma , COPD, former smoker,    breath sounds clear to auscultation       Cardiovascular hypertension, Pt. on medications and Pt. on home beta blockers  Rhythm:Regular Rate:Normal     Neuro/Psych negative neurological ROS     GI/Hepatic negative GI ROS, Neg liver ROS,   Endo/Other  diabetes, Type 2  Renal/GU negative Renal ROS     Musculoskeletal  (+) Arthritis ,   Abdominal   Peds  Hematology negative hematology ROS (+)   Anesthesia Other Findings   Reproductive/Obstetrics                            Lab Results  Component Value Date   WBC 8.2 12/09/2015   HGB 11.2* 12/09/2015   HCT 36.2 12/09/2015   MCV 82.1 12/09/2015   PLT 207 12/09/2015   Lab Results  Component Value Date   CREATININE 0.84 12/09/2015   BUN 15 12/09/2015   NA 141 12/09/2015   K 4.2 12/09/2015   CL 104 12/09/2015   CO2 27 12/09/2015    Anesthesia Physical Anesthesia Plan  ASA: III  Anesthesia Plan: General   Post-op Pain Management:    Induction: Intravenous  Airway Management Planned: Oral ETT  Additional Equipment:   Intra-op Plan:   Post-operative Plan: Extubation in OR  Informed Consent: I have reviewed the patients History and Physical, chart, labs and discussed the procedure including the risks, benefits and alternatives for the proposed anesthesia with the patient or authorized representative who has indicated his/her understanding and acceptance.   Dental advisory given  Plan Discussed with: CRNA  Anesthesia Plan  Comments:         Anesthesia Quick Evaluation

## 2015-12-16 NOTE — Progress Notes (Signed)
Erin Carr notified of arrival time of 5:30 for 12/17/15 surgery.  Erin Carr 's sister or cousin will drive her home . Nothing has changed per patient since 12/14/15.

## 2015-12-17 ENCOUNTER — Encounter (HOSPITAL_COMMUNITY)
Admission: RE | Disposition: A | Discharge status: Home / Self Care | Payer: Self-pay | Source: Ambulatory Visit | Attending: Cardiothoracic Surgery

## 2015-12-17 ENCOUNTER — Encounter (HOSPITAL_COMMUNITY): Payer: Self-pay | Admitting: *Deleted

## 2015-12-17 ENCOUNTER — Ambulatory Visit (HOSPITAL_COMMUNITY)
Admission: RE | Admit: 2015-12-17 | Discharge: 2015-12-17 | Disposition: A | Discharge status: Home / Self Care | Payer: Medicare Other | Source: Ambulatory Visit | Attending: Cardiothoracic Surgery | Admitting: Cardiothoracic Surgery

## 2015-12-17 ENCOUNTER — Ambulatory Visit (HOSPITAL_COMMUNITY): Payer: Medicare Other | Admitting: Certified Registered"

## 2015-12-17 ENCOUNTER — Ambulatory Visit (HOSPITAL_COMMUNITY): Payer: Medicare Other

## 2015-12-17 DIAGNOSIS — I10 Essential (primary) hypertension: Secondary | ICD-10-CM | POA: Insufficient documentation

## 2015-12-17 DIAGNOSIS — R0602 Shortness of breath: Secondary | ICD-10-CM | POA: Diagnosis not present

## 2015-12-17 DIAGNOSIS — Z87891 Personal history of nicotine dependence: Secondary | ICD-10-CM | POA: Diagnosis not present

## 2015-12-17 DIAGNOSIS — E119 Type 2 diabetes mellitus without complications: Secondary | ICD-10-CM | POA: Insufficient documentation

## 2015-12-17 DIAGNOSIS — J449 Chronic obstructive pulmonary disease, unspecified: Secondary | ICD-10-CM | POA: Insufficient documentation

## 2015-12-17 DIAGNOSIS — R590 Localized enlarged lymph nodes: Secondary | ICD-10-CM | POA: Insufficient documentation

## 2015-12-17 DIAGNOSIS — R911 Solitary pulmonary nodule: Secondary | ICD-10-CM | POA: Diagnosis not present

## 2015-12-17 DIAGNOSIS — R918 Other nonspecific abnormal finding of lung field: Secondary | ICD-10-CM | POA: Diagnosis not present

## 2015-12-17 HISTORY — PX: VIDEO BRONCHOSCOPY WITH ENDOBRONCHIAL ULTRASOUND: SHX6177

## 2015-12-17 LAB — GLUCOSE, CAPILLARY: Glucose-Capillary: 128 mg/dL — ABNORMAL HIGH (ref 65–99)

## 2015-12-17 SURGERY — BRONCHOSCOPY, WITH EBUS
Anesthesia: General

## 2015-12-17 MED ORDER — SUGAMMADEX SODIUM 200 MG/2ML IV SOLN
INTRAVENOUS | Status: AC
  Filled 2015-12-17: qty 2

## 2015-12-17 MED ORDER — SUGAMMADEX SODIUM 200 MG/2ML IV SOLN
INTRAVENOUS | Status: DC | PRN
  Administered 2015-12-17: 140.6 mg via INTRAVENOUS

## 2015-12-17 MED ORDER — 0.9 % SODIUM CHLORIDE (POUR BTL) OPTIME
TOPICAL | Status: DC | PRN
  Administered 2015-12-17: 1000 mL

## 2015-12-17 MED ORDER — EPINEPHRINE HCL 1 MG/ML IJ SOLN
INTRAMUSCULAR | Status: AC
  Filled 2015-12-17: qty 1

## 2015-12-17 MED ORDER — SODIUM CHLORIDE 0.9 % IV SOLN
0.0125 ug/kg/min | INTRAVENOUS | Status: AC
  Administered 2015-12-17: .3 ug/kg/min via INTRAVENOUS
  Filled 2015-12-17: qty 2000

## 2015-12-17 MED ORDER — PROPOFOL 10 MG/ML IV BOLUS
INTRAVENOUS | Status: AC
  Filled 2015-12-17: qty 20

## 2015-12-17 MED ORDER — ALBUTEROL SULFATE HFA 108 (90 BASE) MCG/ACT IN AERS: 1.0000 | INHALATION_SPRAY | RESPIRATORY_TRACT | Status: DC | PRN

## 2015-12-17 MED ORDER — ACETAMINOPHEN 500 MG PO TABS: 1000.0000 mg | ORAL_TABLET | Freq: Four times a day (QID) | ORAL | Status: DC

## 2015-12-17 MED ORDER — ROCURONIUM BROMIDE 50 MG/5ML IV SOLN
INTRAVENOUS | Status: AC
  Filled 2015-12-17: qty 1

## 2015-12-17 MED ORDER — ONDANSETRON HCL 4 MG/2ML IJ SOLN
INTRAMUSCULAR | Status: AC
  Filled 2015-12-17: qty 2

## 2015-12-17 MED ORDER — PHENYLEPHRINE 40 MCG/ML (10ML) SYRINGE FOR IV PUSH (FOR BLOOD PRESSURE SUPPORT)
PREFILLED_SYRINGE | INTRAVENOUS | Status: AC
  Filled 2015-12-17: qty 10

## 2015-12-17 MED ORDER — DEXAMETHASONE SODIUM PHOSPHATE 4 MG/ML IJ SOLN
INTRAMUSCULAR | Status: DC | PRN
  Administered 2015-12-17: 4 mg via INTRAVENOUS

## 2015-12-17 MED ORDER — ONDANSETRON HCL 4 MG/2ML IJ SOLN
INTRAMUSCULAR | Status: DC | PRN
  Administered 2015-12-17: 4 mg via INTRAVENOUS

## 2015-12-17 MED ORDER — LACTATED RINGERS IV SOLN
INTRAVENOUS | Status: DC | PRN
  Administered 2015-12-17: 07:00:00 via INTRAVENOUS

## 2015-12-17 MED ORDER — EPHEDRINE SULFATE 50 MG/ML IJ SOLN
INTRAMUSCULAR | Status: AC
  Filled 2015-12-17: qty 1

## 2015-12-17 MED ORDER — GLYCOPYRROLATE 0.2 MG/ML IJ SOLN
INTRAMUSCULAR | Status: AC
  Filled 2015-12-17: qty 1

## 2015-12-17 MED ORDER — STERILE WATER FOR INJECTION IJ SOLN
INTRAMUSCULAR | Status: AC
  Filled 2015-12-17: qty 10

## 2015-12-17 MED ORDER — LIDOCAINE HCL (CARDIAC) 20 MG/ML IV SOLN
INTRAVENOUS | Status: DC | PRN
Start: 2015-12-17 — End: 2015-12-17
  Administered 2015-12-17: 60 mg via INTRAVENOUS

## 2015-12-17 MED ORDER — OXYCODONE HCL 5 MG PO TABS: 5.0000 mg | ORAL_TABLET | ORAL | Status: DC | PRN

## 2015-12-17 MED ORDER — PROPOFOL 10 MG/ML IV BOLUS
INTRAVENOUS | Status: DC | PRN
  Administered 2015-12-17: 80 mg via INTRAVENOUS

## 2015-12-17 MED ORDER — EPHEDRINE SULFATE 50 MG/ML IJ SOLN
INTRAMUSCULAR | Status: DC | PRN
  Administered 2015-12-17 (×5): 5 mg via INTRAVENOUS

## 2015-12-17 MED ORDER — PROMETHAZINE HCL 25 MG/ML IJ SOLN: 6.2500 mg | INTRAMUSCULAR | Status: DC | PRN

## 2015-12-17 MED ORDER — FENTANYL CITRATE (PF) 100 MCG/2ML IJ SOLN: 25.0000 ug | INTRAMUSCULAR | Status: DC | PRN

## 2015-12-17 MED ORDER — LIDOCAINE HCL (CARDIAC) 20 MG/ML IV SOLN
INTRAVENOUS | Status: AC
  Filled 2015-12-17: qty 5

## 2015-12-17 MED ORDER — ROCURONIUM BROMIDE 100 MG/10ML IV SOLN
INTRAVENOUS | Status: DC | PRN
Start: 2015-12-17 — End: 2015-12-17
  Administered 2015-12-17: 30 mg via INTRAVENOUS

## 2015-12-17 SURGICAL SUPPLY — 31 items
BALL CTTN LRG ABS STRL LF (GAUZE/BANDAGES/DRESSINGS)
BRUSH CYTOL CELLEBRITY 1.5X140 (MISCELLANEOUS) ×2 IMPLANT
CANISTER SUCTION 2500CC (MISCELLANEOUS) ×3 IMPLANT
CONT SPEC 4OZ CLIKSEAL STRL BL (MISCELLANEOUS) ×5 IMPLANT
COTTONBALL LRG STERILE PKG (GAUZE/BANDAGES/DRESSINGS) IMPLANT
COVER DOME SNAP 22 D (MISCELLANEOUS) ×3 IMPLANT
COVER TABLE BACK 60X90 (DRAPES) ×3 IMPLANT
FORCEPS BIOP RJ4 1.8 (CUTTING FORCEPS) IMPLANT
GAUZE SPONGE 4X4 12PLY STRL (GAUZE/BANDAGES/DRESSINGS) IMPLANT
GLOVE BIO SURGEON STRL SZ 6.5 (GLOVE) ×2 IMPLANT
GLOVE BIO SURGEON STRL SZ7.5 (GLOVE) ×3 IMPLANT
GLOVE BIO SURGEONS STRL SZ 6.5 (GLOVE) ×2
GLOVE BIOGEL PI IND STRL 6.5 (GLOVE) IMPLANT
GLOVE BIOGEL PI INDICATOR 6.5 (GLOVE) ×4
GOWN STRL REUS W/TWL LRG LVL3 (GOWN DISPOSABLE) ×6 IMPLANT
KIT CLEAN ENDO COMPLIANCE (KITS) ×7 IMPLANT
KIT ROOM TURNOVER OR (KITS) ×3 IMPLANT
MARKER SKIN DUAL TIP RULER LAB (MISCELLANEOUS) ×3 IMPLANT
NDL EBUS SONO TIP PENTAX (NEEDLE) IMPLANT
NEEDLE EBUS SONO TIP PENTAX (NEEDLE) ×3 IMPLANT
NS IRRIG 1000ML POUR BTL (IV SOLUTION) ×3 IMPLANT
OIL SILICONE PENTAX (PARTS (SERVICE/REPAIRS)) ×3 IMPLANT
PAD ARMBOARD 7.5X6 YLW CONV (MISCELLANEOUS) ×6 IMPLANT
SOLUTION ANTI FOG 6CC (MISCELLANEOUS) ×2 IMPLANT
SYR 20CC LL (SYRINGE) ×3 IMPLANT
SYR 20ML ECCENTRIC (SYRINGE) ×3 IMPLANT
SYR 5ML LUER SLIP (SYRINGE) ×3 IMPLANT
TOWEL OR 17X26 10 PK STRL BLUE (TOWEL DISPOSABLE) ×3 IMPLANT
TRAP SPECIMEN MUCOUS 40CC (MISCELLANEOUS) ×3 IMPLANT
TUBE CONNECTING 20'X1/4 (TUBING) ×2
TUBE CONNECTING 20X1/4 (TUBING) ×4 IMPLANT

## 2015-12-17 NOTE — Anesthesia Preprocedure Evaluation (Signed)
Anesthesia Evaluation  Patient identified by MRN, date of birth, ID band Patient awake    Reviewed: Allergy & Precautions, NPO status , Patient's Chart, lab work & pertinent test results  Airway Mallampati: II  TM Distance: >3 FB Neck ROM: Full    Dental no notable dental hx.    Pulmonary COPD, former smoker,  Positive ppd   Pulmonary exam normal breath sounds clear to auscultation       Cardiovascular hypertension, Pt. on medications negative cardio ROS Normal cardiovascular exam Rhythm:Regular Rate:Normal     Neuro/Psych negative neurological ROS  negative psych ROS   GI/Hepatic negative GI ROS, Neg liver ROS,   Endo/Other  diabetes  Renal/GU negative Renal ROS  negative genitourinary   Musculoskeletal negative musculoskeletal ROS (+)   Abdominal   Peds negative pediatric ROS (+)  Hematology negative hematology ROS (+)   Anesthesia Other Findings   Reproductive/Obstetrics negative OB ROS                             Anesthesia Physical Anesthesia Plan  ASA: III  Anesthesia Plan: General   Post-op Pain Management:    Induction: Intravenous  Airway Management Planned: Oral ETT  Additional Equipment:   Intra-op Plan:   Post-operative Plan: Extubation in OR  Informed Consent: I have reviewed the patients History and Physical, chart, labs and discussed the procedure including the risks, benefits and alternatives for the proposed anesthesia with the patient or authorized representative who has indicated his/her understanding and acceptance.   Dental advisory given  Plan Discussed with: CRNA and Surgeon  Anesthesia Plan Comments:         Anesthesia Quick Evaluation

## 2015-12-17 NOTE — Progress Notes (Signed)
The patient was examined and preop studies reviewed. There has been no change from the prior exam and the patient is ready for surgery.   Plan bronch EBUS on L Gasiorowski today

## 2015-12-17 NOTE — Brief Op Note (Signed)
12/17/2015  8:47 AM  PATIENT:  Erin Carr  82 y.o. female  PRE-OPERATIVE DIAGNOSIS:  Right upper lobe lung mass  POST-OPERATIVE DIAGNOSIS:  Right upper lobe lung mass  PROCEDURE:  Procedure(s): VIDEO BRONCHOSCOPY WITH ENDOBRONCHIAL ULTRASOUND (N/A)   Washings and brushinhs of RUL EBUS of 4 R node  SURGEON:  Surgeon(s) and Role:    * Ivin Poot, MD - Primary  PHYSICIAN ASSISTANT:   ASSISTANTS: none   ANESTHESIA:   general  EBL:     BLOOD ADMINISTERED:none  DRAINS: none   LOCAL MEDICATIONS USED:  NONE  SPECIMEN:  Source of Specimen:  RUL, Aspirate and Lavage/Washing, 4 R node aspirate  DISPOSITION OF SPECIMEN:  Cytology of washings,brushings and nodal aspirate, microbiology of washings- routine and AFB- fungal cultures  COUNTS:  YES  TOURNIQUET:  * No tourniquets in log *  DICTATION: .Dragon Dictation  PLAN OF CARE: Discharge to home after PACU  PATIENT DISPOSITION:  PACU - hemodynamically stable.   Delay start of Pharmacological VTE agent (>24hrs) due to surgical blood loss or risk of bleeding: yes

## 2015-12-17 NOTE — Op Note (Signed)
Erin Carr, REDER NO.:  [id number]  MEDICAL RECORD NO.:  [id number]  LOCATION:  [facility]                         FACILITY:  [facility]  PHYSICIAN:  Ivin Poot, M.D.  DATE OF BIRTH:  [birth year]  DATE OF PROCEDURE:  12/17/2015 DATE OF DISCHARGE:                              OPERATIVE REPORT   OPERATION: 1. Videobronchoscopy with washings and brushings of right upper lobe. 2. EBUS-endobronchial ultrasound-guided transbronchial biopsy of 4R     lymph node.  SURGEON:  Ivin Poot, M.D.  PREOPERATIVE DIAGNOSIS:  Right upper lobe nodule and mediastinal adenopathy both with mild hypermetabolic activity on PET scan, rule out cancer.  POSTOPERATIVE DIAGNOSIS:  Right upper lobe nodule and mediastinal adenopathy both with mild hypermetabolic activity on PET scan, rule out cancer.  ANESTHESIA:  General.  INDICATIONS:  The patient is a very nice 82 year old female with symptoms of cough and an abnormal chest x-ray and CT scan.  Followup PET scan showed a subsolid nodular density in the posterior segment of the right upper lobe with mild hypermetabolic activity, SUV of 3.0.  There were mediastinal nodes and right hilar nodes also with mild hypermetabolic activity.  Because of concern over cancer, a bronchoscopy and biopsy was requested, which I felt was appropriate and indicated.  I discussed the procedure of bronchoscopy and biopsies with the patient including the plan for general anesthesia and expected postoperative recovery.  She understood the risks of postoperative hemoptysis or pneumothorax.  Informed consent was obtained.  OPERATIVE PROCEDURE:  The patient was brought to the operating room and placed supine on the operating table where general anesthesia was induced.  A proper time-out was performed.  First, a videobronchoscopy was performed via the endotracheal tube.  The distal trachea and carina were normal.  The left mainstem bronchus and the  segments of the left upper lobe and left lower lobe were visualized and had no abnormalities. The bronchoscope was passed down the right mainstem bronchus, which was normal.  The right lower lobe and right middle lobe bronchial segments were visualized and were normal.  The right upper lobe bronchus had some erythema and inflammation, but no visible tumor.  Washings and brushings of the right upper lobe segments were performed and submitted for cytology.  The bronchoscope was withdrawn.  The EBUS scope was then inserted.  The 4R lymph nodes were visualized using ultrasound and several transbronchial aspirates were sampled and sent for cytology.  The Quick-Prep on the EBUS initial aspirates were negative for cancer.  The airway was examined after the transbronchial biopsies of 4R and there was no significant bleeding.  The bronchoscope was withdrawn.  The patient was then extubated and returned to recovery room in stable condition.     Ivin Poot, M.D.     PV/MEDQ  D:  12/17/2015  T:  12/17/2015  Job:  [id number]  cc:   Dr. Costella Hatcher

## 2015-12-17 NOTE — Anesthesia Procedure Notes (Signed)
Procedure Name: Intubation Date/Time: 12/17/2015 7:30 AM Performed by: Melina Copa, Jyl Chico R Pre-anesthesia Checklist: Patient identified, Emergency Drugs available, Suction available and Patient being monitored Patient Re-evaluated:Patient Re-evaluated prior to inductionOxygen Delivery Method: Circle system utilized Preoxygenation: Pre-oxygenation with 100% oxygen Intubation Type: IV induction Ventilation: Mask ventilation without difficulty Laryngoscope Size: Mac and 3 Grade View: Grade I Tube type: Oral Tube size: 9.0 mm Number of attempts: 1 Airway Equipment and Method: Oral airway Placement Confirmation: ETT inserted through vocal cords under direct vision,  positive ETCO2 and breath sounds checked- equal and bilateral Secured at: 20 cm Tube secured with: Tape Dental Injury: Teeth and Oropharynx as per pre-operative assessment

## 2015-12-17 NOTE — Anesthesia Postprocedure Evaluation (Signed)
Anesthesia Post Note  Patient: Erin Carr  Procedure(s) Performed: Procedure(s) (LRB): VIDEO BRONCHOSCOPY WITH ENDOBRONCHIAL ULTRASOUND (N/A)  Patient location during evaluation: PACU Anesthesia Type: General Level of consciousness: awake and alert Pain management: pain level controlled Vital Signs Assessment: post-procedure vital signs reviewed and stable Respiratory status: spontaneous breathing and respiratory function stable Cardiovascular status: blood pressure returned to baseline and stable Postop Assessment: no signs of nausea or vomiting Anesthetic complications: no    Last Vitals:  Filed Vitals:   12/17/15 0613 12/17/15 0905  BP: 161/78   Pulse: 58   Temp: 36.6 C 36.5 C  Resp: 20     Last Pain: There were no vitals filed for this visit.               Aeriel Boulay S

## 2015-12-17 NOTE — Progress Notes (Signed)
Off monitor pending d/c

## 2015-12-17 NOTE — Transfer of Care (Signed)
Immediate Anesthesia Transfer of Care Note  Patient: Erin Carr  Procedure(s) Performed: Procedure(s): VIDEO BRONCHOSCOPY WITH ENDOBRONCHIAL ULTRASOUND (N/A)  Patient Location: PACU  Anesthesia Type:General  Level of Consciousness: awake and patient cooperative  Airway & Oxygen Therapy: Patient Spontanous Breathing and Patient connected to nasal cannula oxygen  Post-op Assessment: Report given to RN, Post -op Vital signs reviewed and stable and Patient moving all extremities  Post vital signs: Reviewed and stable  Last Vitals:  Filed Vitals:   12/17/15 0613  BP: 161/78  Pulse: 58  Temp: 36.6 C  Resp: 20    Complications: No apparent anesthesia complications

## 2015-12-17 NOTE — Progress Notes (Signed)
She reports that she did not take any medications this am. She is on a beta blocker, but her heart rate is 58. Beta blocker not given this am due to heart rate.

## 2015-12-20 ENCOUNTER — Encounter (HOSPITAL_COMMUNITY): Payer: Self-pay | Admitting: Cardiothoracic Surgery

## 2015-12-20 LAB — CULTURE, RESPIRATORY W GRAM STAIN: Culture: NO GROWTH

## 2015-12-22 ENCOUNTER — Ambulatory Visit: Payer: Medicare Other | Admitting: Cardiothoracic Surgery

## 2015-12-24 ENCOUNTER — Encounter: Payer: Self-pay | Admitting: Cardiothoracic Surgery

## 2015-12-24 ENCOUNTER — Ambulatory Visit (INDEPENDENT_AMBULATORY_CARE_PROVIDER_SITE_OTHER): Payer: Medicare Other | Admitting: Cardiothoracic Surgery

## 2015-12-24 VITALS — BP 118/69 | HR 64 | Resp 20 | Ht 62.5 in | Wt 155.0 lb

## 2015-12-24 DIAGNOSIS — R05 Cough: Secondary | ICD-10-CM | POA: Diagnosis not present

## 2015-12-24 DIAGNOSIS — Z9889 Other specified postprocedural states: Secondary | ICD-10-CM

## 2015-12-24 DIAGNOSIS — R911 Solitary pulmonary nodule: Secondary | ICD-10-CM

## 2015-12-24 NOTE — Progress Notes (Signed)
PCP is Leola Brazil, MD Referring Provider is Vincente Liberty, MD  Chief Complaint  Patient presents with  . Routine Post Op    1 week f/u EBUS, 12/17/15    HPI: The patient is a 82 year old diabetic nonsmoker who has had symptoms of cough and congestion. CT scan showed a nodular density and subcentimeter mediastinal adenopathy both of which had low activity on PET scan. A bronchoscopic biopsy of the right upper lobe density and transbronchial ultrasound guided biopsy of 4R lymph nodes were performed last week. Cytologies from both areas are negative for malignancy. The washings from the right upper lobe showed some gram-negative rods. There is no evidence of AFB or fungal involvement  I will plan on having the patient return in 6 months with a followup CT scan. I will give her a prescription for Z-Pak at this time. She's had no complications from the bronchoscopy.  Past Medical History  Diagnosis Date  . Hypertension   . Hypercholesteremia   . Asthma   . Shortness of breath dyspnea   . Tremors of nervous system     Left hand  . Osteoporosis   . Vitamin D deficiency   . Positive PPD 12/16    PER PCP  . Family history of adverse reaction to anesthesia     "aunt had PONV"  . Diabetes mellitus without complication ([place])     Type II  . COPD (chronic obstructive pulmonary disease) ([place])   . Urinary frequency   . Wears glasses   . Arthritis     Past Surgical History  Procedure Laterality Date  . Abdominal hysterectomy    . Bowel resection    . Colonoscopy    . Anterior and posterior repair N/A 12/16/2014    Procedure: ANTERIOR (CYSTOCELE) AND POSTERIOR REPAIR (RECTOCELE);  Surgeon: Crawford Givens, MD;  Location: [facility];  Service: Gynecology;  Laterality: N/A;  . Breast biopsy Left   . Appendectomy    . Video bronchoscopy with endobronchial ultrasound N/A 12/17/2015    Procedure: VIDEO BRONCHOSCOPY WITH ENDOBRONCHIAL ULTRASOUND;  Surgeon: Ivin Poot, MD;   Location: [facility] OR;  Service: Thoracic;  Laterality: N/A;    Family History  Problem Relation Age of Onset  . Hypertension Father   . Diabetes Father     Social History Social History  Substance Use Topics  . Smoking status: Former Smoker -- 0.50 packs/day for 40 years    Types: Cigarettes  . Smokeless tobacco: Never Used     Comment: quit about 15-16 years ago; 12/09/2015  . Alcohol Use: No    Current Outpatient Prescriptions  Medication Sig Dispense Refill  . albuterol (PROVENTIL HFA;VENTOLIN HFA) 108 (90 Base) MCG/ACT inhaler Inhale 1 puff into the lungs every 4 (four) hours as needed for wheezing or shortness of breath.    Marland Kitchen amLODipine (NORVASC) 10 MG tablet Take 10 mg by mouth daily.    Marland Kitchen aspirin EC 81 MG tablet Take 81 mg by mouth daily.    . ergocalciferol (VITAMIN D2) 50000 units capsule Take 50,000 Units by mouth once a week.    . furosemide (LASIX) 40 MG tablet Take 40 mg by mouth 2 (two) times daily.    Marland Kitchen gabapentin (NEURONTIN) 400 MG capsule Take 400 mg by mouth 2 (two) times daily.    Marland Kitchen glipiZIDE (GLUCOTROL) 5 MG tablet Take 5 mg by mouth daily before breakfast.    . labetalol (NORMODYNE) 100 MG tablet Take 100 mg by mouth 2 (two) times  daily.    . lisinopril (PRINIVIL,ZESTRIL) 20 MG tablet Take 20 mg by mouth 2 (two) times daily.    . metFORMIN (GLUCOPHAGE) 500 MG tablet Take 500 mg by mouth 2 (two) times daily with a meal.    . potassium chloride (K-DUR) 10 MEQ tablet Take 10 mEq by mouth daily.    . propranolol (INDERAL) 10 MG tablet Take 10 mg by mouth every 12 (twelve) hours.    . simvastatin (ZOCOR) 20 MG tablet Take 20 mg by mouth at bedtime.      No current facility-administered medications for this visit.    No Known Allergies  Review of Systems  She is a small amount of sinus congestion today She had a small amount of hemoptysis transient after the bronchoscopy  BP 118/69 mmHg  Pulse 64  Resp 20  Ht 5' 2.5" (1.588 m)  Wt 155 lb (70.308 kg)  BMI 27.88  kg/m2  SpO2 98% Physical Exam Alert and comfortable Scattered wheezes on the right lung No adenopathy the neck Heart rhythm regular  Diagnostic Tests: No chest x-ray today  Impression: Negative bronchoscopic biopsies of right upper lobe density and mediastinal nodes. We'll set patient up for followup CT scan of chest in about 6 months.  Plan: Return for chest CT scan to to be scheduled later this spring.Len Childs, MD [facility] [phone]

## 2016-01-14 LAB — FUNGUS CULTURE W SMEAR: Fungal Smear: NONE SEEN

## 2016-01-30 LAB — AFB CULTURE WITH SMEAR (NOT AT ARMC): Acid Fast Smear: NONE SEEN

## 2016-02-03 DIAGNOSIS — E119 Type 2 diabetes mellitus without complications: Secondary | ICD-10-CM | POA: Diagnosis not present

## 2016-02-03 DIAGNOSIS — R911 Solitary pulmonary nodule: Secondary | ICD-10-CM | POA: Diagnosis not present

## 2016-02-03 DIAGNOSIS — J418 Mixed simple and mucopurulent chronic bronchitis: Secondary | ICD-10-CM | POA: Diagnosis not present

## 2016-02-03 DIAGNOSIS — E559 Vitamin D deficiency, unspecified: Secondary | ICD-10-CM | POA: Diagnosis not present

## 2016-02-03 DIAGNOSIS — M1990 Unspecified osteoarthritis, unspecified site: Secondary | ICD-10-CM | POA: Diagnosis not present

## 2016-02-03 DIAGNOSIS — Z79899 Other long term (current) drug therapy: Secondary | ICD-10-CM | POA: Diagnosis not present

## 2016-02-03 DIAGNOSIS — I119 Hypertensive heart disease without heart failure: Secondary | ICD-10-CM | POA: Diagnosis not present

## 2016-02-03 DIAGNOSIS — R7611 Nonspecific reaction to tuberculin skin test without active tuberculosis: Secondary | ICD-10-CM | POA: Diagnosis not present

## 2016-02-03 DIAGNOSIS — J4520 Mild intermittent asthma, uncomplicated: Secondary | ICD-10-CM | POA: Diagnosis not present

## 2016-02-03 DIAGNOSIS — E7800 Pure hypercholesterolemia, unspecified: Secondary | ICD-10-CM | POA: Diagnosis not present

## 2016-02-03 DIAGNOSIS — R251 Tremor, unspecified: Secondary | ICD-10-CM | POA: Diagnosis not present

## 2016-03-23 ENCOUNTER — Other Ambulatory Visit: Payer: Self-pay | Admitting: Cardiothoracic Surgery

## 2016-03-24 ENCOUNTER — Other Ambulatory Visit: Payer: Self-pay | Admitting: Cardiothoracic Surgery

## 2016-03-24 DIAGNOSIS — D381 Neoplasm of uncertain behavior of trachea, bronchus and lung: Secondary | ICD-10-CM

## 2016-04-26 ENCOUNTER — Ambulatory Visit
Admission: RE | Admit: 2016-04-26 | Discharge: 2016-04-26 | Disposition: A | Discharge status: Home / Self Care | Payer: Medicare Other | Source: Ambulatory Visit | Attending: Cardiothoracic Surgery | Admitting: Cardiothoracic Surgery

## 2016-04-26 ENCOUNTER — Ambulatory Visit: Payer: Medicare Other | Admitting: Cardiothoracic Surgery

## 2016-04-26 DIAGNOSIS — D381 Neoplasm of uncertain behavior of trachea, bronchus and lung: Secondary | ICD-10-CM

## 2016-04-26 DIAGNOSIS — R918 Other nonspecific abnormal finding of lung field: Secondary | ICD-10-CM | POA: Diagnosis not present

## 2016-04-26 MED ORDER — IOPAMIDOL (ISOVUE-300) INJECTION 61%
75.0000 mL | Freq: Once | INTRAVENOUS | Status: AC | PRN
  Administered 2016-04-26: 75 mL via INTRAVENOUS

## 2016-05-03 ENCOUNTER — Ambulatory Visit: Payer: Medicare Other | Admitting: Cardiothoracic Surgery

## 2016-05-10 ENCOUNTER — Ambulatory Visit (INDEPENDENT_AMBULATORY_CARE_PROVIDER_SITE_OTHER): Payer: Medicare Other | Admitting: Cardiothoracic Surgery

## 2016-05-10 ENCOUNTER — Encounter: Payer: Self-pay | Admitting: Cardiothoracic Surgery

## 2016-05-10 VITALS — BP 171/71 | HR 59 | Resp 16 | Ht 62.5 in | Wt 150.0 lb

## 2016-05-10 DIAGNOSIS — R599 Enlarged lymph nodes, unspecified: Secondary | ICD-10-CM

## 2016-05-10 DIAGNOSIS — Z9889 Other specified postprocedural states: Secondary | ICD-10-CM | POA: Diagnosis not present

## 2016-05-10 DIAGNOSIS — R911 Solitary pulmonary nodule: Secondary | ICD-10-CM

## 2016-05-10 DIAGNOSIS — R590 Localized enlarged lymph nodes: Secondary | ICD-10-CM

## 2016-05-10 NOTE — Progress Notes (Signed)
PCP is Leola Brazil, MD Referring Provider is Vincente Liberty, MD  Chief Complaint  Patient presents with  . Follow-up    with CT CHEST for surveillance of lung nodule/mediastinal adenopathy    HPI: Elderly but very nice 82 year old female returns for followup and  3 month interval repeat CT scan. She was found to have infiltrated density in the posterior segment right upper lobe with mild activity on PET scan. She underwent bronchoscopy and biopsies and transbronchial be sound total biopsies of all which were negative. Some gram-negative rods were obtained on the bronchoscopy washings. She was given a course of antibiotics. She returns for repeat scan and review. She denies fever, weight loss, productive cough, hemoptysis, chest pain, or night sweats. Brushings and washings were negative for AFB  CT scan today shows no change in the irregular density which appears inflammatory. From the initial CT scan there is been no change in the past 6 months. This appears to be benign inflammatory density. I have recommended to the patient that we not repeat biopsy or plan surgery but to follow this with a followup scan in 4 months.she will notify us in the inner mesh he developed symptoms.  Past Medical History  Diagnosis Date  . Hypertension   . Hypercholesteremia   . Asthma   . Shortness of breath dyspnea   . Tremors of nervous system     Left hand  . Osteoporosis   . Vitamin D deficiency   . Positive PPD 12/16    PER PCP  . Family history of adverse reaction to anesthesia     "aunt had PONV"  . Diabetes mellitus without complication ([place])     Type II  . COPD (chronic obstructive pulmonary disease) ([place])   . Urinary frequency   . Wears glasses   . Arthritis     Past Surgical History  Procedure Laterality Date  . Abdominal hysterectomy    . Bowel resection    . Colonoscopy    . Anterior and posterior repair N/A 12/16/2014    Procedure: ANTERIOR (CYSTOCELE) AND  POSTERIOR REPAIR (RECTOCELE);  Surgeon: Crawford Givens, MD;  Location: [facility];  Service: Gynecology;  Laterality: N/A;  . Breast biopsy Left   . Appendectomy    . Video bronchoscopy with endobronchial ultrasound N/A 12/17/2015    Procedure: VIDEO BRONCHOSCOPY WITH ENDOBRONCHIAL ULTRASOUND;  Surgeon: Ivin Poot, MD;  Location: [facility] OR;  Service: Thoracic;  Laterality: N/A;    Family History  Problem Relation Age of Onset  . Hypertension Father   . Diabetes Father     Social History Social History  Substance Use Topics  . Smoking status: Former Smoker -- 0.50 packs/day for 40 years    Types: Cigarettes  . Smokeless tobacco: Never Used     Comment: quit about 15-16 years ago; 12/09/2015  . Alcohol Use: No    Current Outpatient Prescriptions  Medication Sig Dispense Refill  . albuterol (PROVENTIL HFA;VENTOLIN HFA) 108 (90 Base) MCG/ACT inhaler Inhale 1 puff into the lungs every 4 (four) hours as needed for wheezing or shortness of breath.    Marland Kitchen amLODipine (NORVASC) 10 MG tablet Take 10 mg by mouth daily.    Marland Kitchen aspirin EC 81 MG tablet Take 81 mg by mouth daily.    . ergocalciferol (VITAMIN D2) 50000 units capsule Take 50,000 Units by mouth once a week.    . furosemide (LASIX) 40 MG tablet Take 40 mg by mouth 2 (two) times daily.    Marland Kitchen  gabapentin (NEURONTIN) 400 MG capsule Take 400 mg by mouth 2 (two) times daily.    Marland Kitchen glipiZIDE (GLUCOTROL) 5 MG tablet Take 5 mg by mouth daily before breakfast.    . labetalol (NORMODYNE) 100 MG tablet Take 100 mg by mouth 2 (two) times daily.    Marland Kitchen lisinopril (PRINIVIL,ZESTRIL) 20 MG tablet Take 20 mg by mouth 2 (two) times daily.    . metFORMIN (GLUCOPHAGE) 500 MG tablet Take 500 mg by mouth 2 (two) times daily with a meal.    . potassium chloride (K-DUR) 10 MEQ tablet Take 10 mEq by mouth daily.    . propranolol (INDERAL) 10 MG tablet Take 10 mg by mouth every 12 (twelve) hours.    . simvastatin (ZOCOR) 20 MG tablet Take 20 mg by mouth at bedtime.       No current facility-administered medications for this visit.    No Known Allergies  Review of Systems   No fever No weight loss No headache or neurologic symptoms  BP 171/71 mmHg  Pulse 59  Resp 16  Ht 5' 2.5" (1.588 m)  Wt 150 lb (68.04 kg)  BMI 26.98 kg/m2  SpO2 98% Physical Exam Alert and comfortable Breath sounds were bilateral scattered rhonchi No cervical adenopathy  cardiac rhythm regular Neuro intact  Diagnostic Tests: CT scan personally reviewed and counseled with patient and sister. With little change over several months this is probably an inflammatory scar with minimal activity and PET scan.  Impression: Infiltrative density with central cavitation probably inflammatory based on minimal change in morphology over 6 months on serial CT scans.  Plan:return for followup surveillance scan in 4 months.   Len Childs, MD [facility] [phone]

## 2016-05-16 DIAGNOSIS — E559 Vitamin D deficiency, unspecified: Secondary | ICD-10-CM | POA: Diagnosis not present

## 2016-05-16 DIAGNOSIS — D491 Neoplasm of unspecified behavior of respiratory system: Secondary | ICD-10-CM | POA: Diagnosis not present

## 2016-05-16 DIAGNOSIS — E1165 Type 2 diabetes mellitus with hyperglycemia: Secondary | ICD-10-CM | POA: Diagnosis not present

## 2016-05-16 DIAGNOSIS — R251 Tremor, unspecified: Secondary | ICD-10-CM | POA: Diagnosis not present

## 2016-05-16 DIAGNOSIS — Z79899 Other long term (current) drug therapy: Secondary | ICD-10-CM | POA: Diagnosis not present

## 2016-05-16 DIAGNOSIS — I119 Hypertensive heart disease without heart failure: Secondary | ICD-10-CM | POA: Diagnosis not present

## 2016-05-16 DIAGNOSIS — M1990 Unspecified osteoarthritis, unspecified site: Secondary | ICD-10-CM | POA: Diagnosis not present

## 2016-05-16 DIAGNOSIS — R7611 Nonspecific reaction to tuberculin skin test without active tuberculosis: Secondary | ICD-10-CM | POA: Diagnosis not present

## 2016-05-16 DIAGNOSIS — J4520 Mild intermittent asthma, uncomplicated: Secondary | ICD-10-CM | POA: Diagnosis not present

## 2016-05-16 DIAGNOSIS — J441 Chronic obstructive pulmonary disease with (acute) exacerbation: Secondary | ICD-10-CM | POA: Diagnosis not present

## 2016-05-16 DIAGNOSIS — E7800 Pure hypercholesterolemia, unspecified: Secondary | ICD-10-CM | POA: Diagnosis not present

## 2016-07-06 DIAGNOSIS — H40013 Open angle with borderline findings, low risk, bilateral: Secondary | ICD-10-CM | POA: Diagnosis not present

## 2016-07-06 DIAGNOSIS — E113293 Type 2 diabetes mellitus with mild nonproliferative diabetic retinopathy without macular edema, bilateral: Secondary | ICD-10-CM | POA: Diagnosis not present

## 2016-07-06 DIAGNOSIS — H2513 Age-related nuclear cataract, bilateral: Secondary | ICD-10-CM | POA: Diagnosis not present

## 2016-07-06 DIAGNOSIS — H25812 Combined forms of age-related cataract, left eye: Secondary | ICD-10-CM | POA: Diagnosis not present

## 2016-08-25 ENCOUNTER — Other Ambulatory Visit: Payer: Self-pay | Admitting: Pulmonary Disease

## 2016-08-25 ENCOUNTER — Other Ambulatory Visit: Payer: Self-pay | Admitting: *Deleted

## 2016-08-25 DIAGNOSIS — D381 Neoplasm of uncertain behavior of trachea, bronchus and lung: Secondary | ICD-10-CM

## 2016-08-25 DIAGNOSIS — Z1231 Encounter for screening mammogram for malignant neoplasm of breast: Secondary | ICD-10-CM

## 2016-08-29 DIAGNOSIS — R911 Solitary pulmonary nodule: Secondary | ICD-10-CM | POA: Diagnosis not present

## 2016-08-29 DIAGNOSIS — M1990 Unspecified osteoarthritis, unspecified site: Secondary | ICD-10-CM | POA: Diagnosis not present

## 2016-08-29 DIAGNOSIS — I119 Hypertensive heart disease without heart failure: Secondary | ICD-10-CM | POA: Diagnosis not present

## 2016-08-29 DIAGNOSIS — Z0001 Encounter for general adult medical examination with abnormal findings: Secondary | ICD-10-CM | POA: Diagnosis not present

## 2016-08-29 DIAGNOSIS — J4520 Mild intermittent asthma, uncomplicated: Secondary | ICD-10-CM | POA: Diagnosis not present

## 2016-08-29 DIAGNOSIS — J441 Chronic obstructive pulmonary disease with (acute) exacerbation: Secondary | ICD-10-CM | POA: Diagnosis not present

## 2016-08-29 DIAGNOSIS — Z1211 Encounter for screening for malignant neoplasm of colon: Secondary | ICD-10-CM | POA: Diagnosis not present

## 2016-08-29 DIAGNOSIS — E7800 Pure hypercholesterolemia, unspecified: Secondary | ICD-10-CM | POA: Diagnosis not present

## 2016-08-29 DIAGNOSIS — Z79899 Other long term (current) drug therapy: Secondary | ICD-10-CM | POA: Diagnosis not present

## 2016-08-29 DIAGNOSIS — E559 Vitamin D deficiency, unspecified: Secondary | ICD-10-CM | POA: Diagnosis not present

## 2016-08-29 DIAGNOSIS — E1165 Type 2 diabetes mellitus with hyperglycemia: Secondary | ICD-10-CM | POA: Diagnosis not present

## 2016-08-29 DIAGNOSIS — Z23 Encounter for immunization: Secondary | ICD-10-CM | POA: Diagnosis not present

## 2016-08-30 ENCOUNTER — Ambulatory Visit
Admission: RE | Admit: 2016-08-30 | Discharge: 2016-08-30 | Disposition: A | Discharge status: Home / Self Care | Payer: Medicare Other | Source: Ambulatory Visit | Attending: Pulmonary Disease | Admitting: Pulmonary Disease

## 2016-08-30 DIAGNOSIS — Z1231 Encounter for screening mammogram for malignant neoplasm of breast: Secondary | ICD-10-CM | POA: Diagnosis not present

## 2016-09-27 ENCOUNTER — Ambulatory Visit
Admission: RE | Admit: 2016-09-27 | Discharge: 2016-09-27 | Disposition: A | Discharge status: Home / Self Care | Payer: Medicare Other | Source: Ambulatory Visit | Attending: Cardiothoracic Surgery | Admitting: Cardiothoracic Surgery

## 2016-09-27 ENCOUNTER — Ambulatory Visit (INDEPENDENT_AMBULATORY_CARE_PROVIDER_SITE_OTHER): Payer: Medicare Other | Admitting: Cardiothoracic Surgery

## 2016-09-27 ENCOUNTER — Other Ambulatory Visit: Payer: Self-pay | Admitting: *Deleted

## 2016-09-27 VITALS — BP 140/65 | HR 62 | Resp 20 | Ht 62.5 in | Wt 150.0 lb

## 2016-09-27 DIAGNOSIS — R590 Localized enlarged lymph nodes: Secondary | ICD-10-CM | POA: Diagnosis not present

## 2016-09-27 DIAGNOSIS — D381 Neoplasm of uncertain behavior of trachea, bronchus and lung: Secondary | ICD-10-CM

## 2016-09-27 DIAGNOSIS — R911 Solitary pulmonary nodule: Secondary | ICD-10-CM | POA: Diagnosis not present

## 2016-09-27 DIAGNOSIS — C3411 Malignant neoplasm of upper lobe, right bronchus or lung: Secondary | ICD-10-CM | POA: Diagnosis not present

## 2016-09-27 MED ORDER — IOPAMIDOL (ISOVUE-300) INJECTION 61%
75.0000 mL | Freq: Once | INTRAVENOUS | Status: AC | PRN
  Administered 2016-09-27: 75 mL via INTRAVENOUS

## 2016-09-27 NOTE — Progress Notes (Signed)
PCP is Leola Brazil, MD Referring Provider is Vincente Liberty, MD  Chief Complaint  Patient presents with  . Lung Lesion    4 month f/u with Chest CT    HPI: Patient returns for further evaluation of a right upper lobe spiculated density approximately 2.5 cm which has been persistent since late 2016. She returns today with a follow-up CT scan. Previous bronchoscopic biopsies were negative and washings did return gram negative rods . The patient was treated with antibiotics. Although she denies problems with weight loss chest pain productive cough night sweats the density has not resolved at all since her previous CT scan 4 months ago.  I recommended to the patient that she undergo a repeat biopsy this time transthoracic CT-guided by interventional radiology. If the patient does have a slow growing lung cancer her best therapy would be stereotactic body radiation therapy SBRT  Past Medical History:  Diagnosis Date  . Arthritis   . Asthma   . COPD (chronic obstructive pulmonary disease) ([place])   . Diabetes mellitus without complication ([place])    Type II  . Family history of adverse reaction to anesthesia    "aunt had PONV"  . Hypercholesteremia   . Hypertension   . Osteoporosis   . Positive PPD 12/16   PER PCP  . Shortness of breath dyspnea   . Tremors of nervous system    Left hand  . Urinary frequency   . Vitamin D deficiency   . Wears glasses     Past Surgical History:  Procedure Laterality Date  . ABDOMINAL HYSTERECTOMY    . ANTERIOR AND POSTERIOR REPAIR N/A 12/16/2014   Procedure: ANTERIOR (CYSTOCELE) AND POSTERIOR REPAIR (RECTOCELE);  Surgeon: Crawford Givens, MD;  Location: [facility];  Service: Gynecology;  Laterality: N/A;  . APPENDECTOMY    . BOWEL RESECTION    . BREAST BIOPSY Left   . COLONOSCOPY    . VIDEO BRONCHOSCOPY WITH ENDOBRONCHIAL ULTRASOUND N/A 12/17/2015   Procedure: VIDEO BRONCHOSCOPY WITH ENDOBRONCHIAL ULTRASOUND;  Surgeon: Ivin Poot, MD;   Location: [facility] OR;  Service: Thoracic;  Laterality: N/A;    Family History  Problem Relation Age of Onset  . Hypertension Father   . Diabetes Father     Social History Social History  Substance Use Topics  . Smoking status: Former Smoker    Packs/day: 0.50    Years: 40.00    Types: Cigarettes  . Smokeless tobacco: Never Used     Comment: quit about 15-16 years ago; 12/09/2015  . Alcohol use No    Current Outpatient Prescriptions  Medication Sig Dispense Refill  . albuterol (PROVENTIL HFA;VENTOLIN HFA) 108 (90 Base) MCG/ACT inhaler Inhale 1 puff into the lungs every 4 (four) hours as needed for wheezing or shortness of breath.    Marland Kitchen amLODipine (NORVASC) 10 MG tablet Take 10 mg by mouth daily.    Marland Kitchen aspirin EC 81 MG tablet Take 81 mg by mouth daily.    . ergocalciferol (VITAMIN D2) 50000 units capsule Take 50,000 Units by mouth once a week.    . furosemide (LASIX) 40 MG tablet Take 40 mg by mouth 2 (two) times daily.    Marland Kitchen gabapentin (NEURONTIN) 400 MG capsule Take 400 mg by mouth 2 (two) times daily.    Marland Kitchen glipiZIDE (GLUCOTROL) 5 MG tablet Take 5 mg by mouth daily before breakfast.    . labetalol (NORMODYNE) 100 MG tablet Take 100 mg by mouth 2 (two) times daily.    Marland Kitchen  lisinopril (PRINIVIL,ZESTRIL) 20 MG tablet Take 20 mg by mouth 2 (two) times daily.    . metFORMIN (GLUCOPHAGE) 500 MG tablet Take 500 mg by mouth 2 (two) times daily with a meal.    . potassium chloride (K-DUR) 10 MEQ tablet Take 10 mEq by mouth daily.    . propranolol (INDERAL) 10 MG tablet Take 10 mg by mouth every 12 (twelve) hours.    . simvastatin (ZOCOR) 20 MG tablet Take 20 mg by mouth at bedtime.      No current facility-administered medications for this visit.     No Known Allergies  Review of Systems  No fever or weight loss No chest pain No shortness of breath No leg edema No change in bowel habits No headaches or dizziness or change in vision  BP 140/65 (BP Location: Left Arm, Patient Position:  Sitting, Cuff Size: Normal)   Pulse 62   Resp 20   Ht 5' 2.5" (1.588 m)   Wt 150 lb (68 kg)   SpO2 97% Comment: RA  BMI 27.00 kg/m  Physical Exam      Exam    General- alert and comfortable   Lungs- clear without rales, wheezes   Cor- regular rate and rhythm, no murmur , gallop   Abdomen- soft, non-tender   Extremities - warm, non-tender, minimal edema   Neuro- oriented, appropriate, no focal weakness  Diagnostic Tests: CT scan shows the density to remain unchanged approximately 2.5 cm.  Impression: Spiculated mass suspicious for lung cancer despite negative bronchoscopy  Plan: CT guided interventional radiology biopsy to be set up. Procedure discussed with patient including indications and risks.   Len Childs, MD [facility] [phone]

## 2016-10-10 DIAGNOSIS — H25813 Combined forms of age-related cataract, bilateral: Secondary | ICD-10-CM | POA: Diagnosis not present

## 2016-10-10 DIAGNOSIS — H40013 Open angle with borderline findings, low risk, bilateral: Secondary | ICD-10-CM | POA: Diagnosis not present

## 2016-10-10 DIAGNOSIS — H401133 Primary open-angle glaucoma, bilateral, severe stage: Secondary | ICD-10-CM | POA: Diagnosis not present

## 2016-10-10 DIAGNOSIS — H2513 Age-related nuclear cataract, bilateral: Secondary | ICD-10-CM | POA: Diagnosis not present

## 2016-10-30 ENCOUNTER — Other Ambulatory Visit: Payer: Self-pay | Admitting: Student

## 2016-10-31 ENCOUNTER — Ambulatory Visit (HOSPITAL_COMMUNITY)
Admission: RE | Admit: 2016-10-31 | Discharge: 2016-10-31 | Disposition: A | Discharge status: Home / Self Care | Payer: Medicare Other | Source: Ambulatory Visit | Attending: Cardiothoracic Surgery | Admitting: Cardiothoracic Surgery

## 2016-10-31 ENCOUNTER — Encounter (HOSPITAL_COMMUNITY): Payer: Self-pay

## 2016-10-31 DIAGNOSIS — Z8249 Family history of ischemic heart disease and other diseases of the circulatory system: Secondary | ICD-10-CM | POA: Insufficient documentation

## 2016-10-31 DIAGNOSIS — Z87891 Personal history of nicotine dependence: Secondary | ICD-10-CM | POA: Diagnosis not present

## 2016-10-31 DIAGNOSIS — Z7982 Long term (current) use of aspirin: Secondary | ICD-10-CM | POA: Diagnosis not present

## 2016-10-31 DIAGNOSIS — E119 Type 2 diabetes mellitus without complications: Secondary | ICD-10-CM | POA: Insufficient documentation

## 2016-10-31 DIAGNOSIS — M810 Age-related osteoporosis without current pathological fracture: Secondary | ICD-10-CM | POA: Diagnosis not present

## 2016-10-31 DIAGNOSIS — J449 Chronic obstructive pulmonary disease, unspecified: Secondary | ICD-10-CM | POA: Insufficient documentation

## 2016-10-31 DIAGNOSIS — Z79899 Other long term (current) drug therapy: Secondary | ICD-10-CM | POA: Insufficient documentation

## 2016-10-31 DIAGNOSIS — C3411 Malignant neoplasm of upper lobe, right bronchus or lung: Secondary | ICD-10-CM | POA: Insufficient documentation

## 2016-10-31 DIAGNOSIS — M1990 Unspecified osteoarthritis, unspecified site: Secondary | ICD-10-CM | POA: Diagnosis not present

## 2016-10-31 DIAGNOSIS — Z7984 Long term (current) use of oral hypoglycemic drugs: Secondary | ICD-10-CM | POA: Diagnosis not present

## 2016-10-31 DIAGNOSIS — R911 Solitary pulmonary nodule: Secondary | ICD-10-CM

## 2016-10-31 DIAGNOSIS — I10 Essential (primary) hypertension: Secondary | ICD-10-CM | POA: Diagnosis not present

## 2016-10-31 DIAGNOSIS — Z833 Family history of diabetes mellitus: Secondary | ICD-10-CM | POA: Insufficient documentation

## 2016-10-31 DIAGNOSIS — E559 Vitamin D deficiency, unspecified: Secondary | ICD-10-CM | POA: Diagnosis not present

## 2016-10-31 DIAGNOSIS — E7800 Pure hypercholesterolemia, unspecified: Secondary | ICD-10-CM | POA: Diagnosis not present

## 2016-10-31 DIAGNOSIS — R918 Other nonspecific abnormal finding of lung field: Secondary | ICD-10-CM | POA: Diagnosis not present

## 2016-10-31 LAB — CBC
HCT: 35.7 % — ABNORMAL LOW (ref 36.0–46.0)
Hemoglobin: 11.1 g/dL — ABNORMAL LOW (ref 12.0–15.0)
MCH: 25.7 pg — AB (ref 26.0–34.0)
MCHC: 31.1 g/dL (ref 30.0–36.0)
MCV: 82.6 fL (ref 78.0–100.0)
PLATELETS: 203 10*3/uL (ref 150–400)
RBC: 4.32 MIL/uL (ref 3.87–5.11)
RDW: 14.2 % (ref 11.5–15.5)
WBC: 7 10*3/uL (ref 4.0–10.5)

## 2016-10-31 LAB — PROTIME-INR
INR: 1.01
PROTHROMBIN TIME: 13.3 s (ref 11.4–15.2)

## 2016-10-31 LAB — APTT: aPTT: 30 seconds (ref 24–36)

## 2016-10-31 LAB — GLUCOSE, CAPILLARY
Glucose-Capillary: 121 mg/dL — ABNORMAL HIGH (ref 65–99)
Glucose-Capillary: 100 mg/dL — ABNORMAL HIGH (ref 65–99)

## 2016-10-31 MED ORDER — SODIUM CHLORIDE 0.9 % IV SOLN
INTRAVENOUS | Status: AC | PRN
  Administered 2016-10-31: 10 mL/h via INTRAVENOUS

## 2016-10-31 MED ORDER — MIDAZOLAM HCL 2 MG/2ML IJ SOLN
INTRAMUSCULAR | Status: AC | PRN
  Administered 2016-10-31: 0.5 mg via INTRAVENOUS
  Administered 2016-10-31: 1 mg via INTRAVENOUS

## 2016-10-31 MED ORDER — SODIUM CHLORIDE 0.9 % IV SOLN: INTRAVENOUS | Status: DC

## 2016-10-31 MED ORDER — MIDAZOLAM HCL 2 MG/2ML IJ SOLN
INTRAMUSCULAR | Status: AC
  Filled 2016-10-31: qty 4

## 2016-10-31 MED ORDER — LIDOCAINE HCL 1 % IJ SOLN
INTRAMUSCULAR | Status: AC
  Filled 2016-10-31: qty 20

## 2016-10-31 MED ORDER — FENTANYL CITRATE (PF) 100 MCG/2ML IJ SOLN
INTRAMUSCULAR | Status: AC | PRN
  Administered 2016-10-31: 25 ug via INTRAVENOUS
  Administered 2016-10-31: 50 ug via INTRAVENOUS

## 2016-10-31 MED ORDER — FENTANYL CITRATE (PF) 100 MCG/2ML IJ SOLN
INTRAMUSCULAR | Status: AC
  Filled 2016-10-31: qty 2

## 2016-10-31 NOTE — Sedation Documentation (Signed)
Patient is resting comfortably. 

## 2016-10-31 NOTE — Procedures (Signed)
Interventional Radiology Procedure Note  Procedure:  CT guided core biopsy of right upper lobe lung mass  Complications: None  Estimated Blood Loss: < 10 mL  17 G needle advanced from posterior approach to level of RUL lung lesion.  18 G core biopsy x 1. Biosentry device used to deploy pleural plug on completion.  No PTX on post biopsy CT. Plan:  2 hour bedrest.  Eulas Post T. Kathlene Cote, M.D Pager:  [phone]

## 2016-10-31 NOTE — Discharge Instructions (Signed)
Needle Biopsy of Lung, Care After Refer to this sheet in the next few weeks. These instructions provide you with information on caring for yourself after your procedure. Your health care provider may also give you more specific instructions. Your treatment has been planned according to current medical practices, but problems sometimes occur. Call your health care provider if you have any problems or questions after your procedure. WHAT TO EXPECT AFTER THE PROCEDURE  A bandage will be applied over the area where the needle was inserted. You may be asked to apply pressure to the bandage for several minutes to ensure there is minimal bleeding.  In most cases, you can leave when your needle biopsy procedure is completed. Do not drive yourself home. Someone else should take you home.  If you received an IV sedative or general anesthetic, you will be taken to a comfortable place to relax while the medicine wears off.  If you have upcoming travel scheduled, talk to your health care provider about when it is safe to travel by air after the procedure. HOME CARE INSTRUCTIONS  Expect to take it easy for the rest of the day.  Protect the area where you received the needle biopsy by keeping the bandage in place for as long as instructed.  You may feel some mild pain or discomfort in the area, but this should stop in a day or two.  Take medicines only as directed by your health care provider. SEEK MEDICAL CARE IF:   You have pain at the biopsy site that worsens or is not helped by medicine.  You have swelling or drainage at the needle biopsy site.  You have a fever. SEEK IMMEDIATE MEDICAL CARE IF:   You have new or worsening shortness of breath.  You have chest pain.  You are coughing up blood.  You have bleeding that does not stop with pressure or a bandage.  You develop light-headedness or fainting. This information is not intended to replace advice given to you by your health care  provider. Make sure you discuss any questions you have with your health care provider. Document Released: 09/17/2007 Document Revised: 12/11/2014 Document Reviewed: 04/14/2013 Elsevier Interactive Patient Education  2017 [employer].

## 2016-10-31 NOTE — H&P (Signed)
Chief Complaint: Right lung mass  Referring Physician(s): Ivin Poot  Supervising Physician: Aletta Edouard  Patient Status: [facility] - Out-pt  History of Present Illness: Erin Carr is a 82 y.o. female who is followed by Dr. Prescott Gum for a right upper lobe spiculated density approximately 2.5 cm which has been persistent since late 2016.   Previous bronchoscopic biopsies were negative and washings did return gram negative rods .   She patient was treated with antibiotics.  The mass has not resolved since CT 4 months prior.  Dr. Prescott Gum has recommended CT-guided biopsy.  Per his note, If the patient does have a slow growing lung cancer her best therapy would be stereotactic body radiation therapy SBRT.  She feels well today, no fever/chills.  She does not take blood thinners.  She is NPO.  She does c/o cough. Some SOB with exertion.   She has history of smoking = 40 pack years.  Past Medical History:  Diagnosis Date  . Arthritis   . Asthma   . COPD (chronic obstructive pulmonary disease) ([place])   . Diabetes mellitus without complication ([place])    Type II  . Family history of adverse reaction to anesthesia    "aunt had PONV"  . Hypercholesteremia   . Hypertension   . Osteoporosis   . Positive PPD 12/16   PER PCP  . Shortness of breath dyspnea   . Tremors of nervous system    Left hand  . Urinary frequency   . Vitamin D deficiency   . Wears glasses     Past Surgical History:  Procedure Laterality Date  . ABDOMINAL HYSTERECTOMY    . ANTERIOR AND POSTERIOR REPAIR N/A 12/16/2014   Procedure: ANTERIOR (CYSTOCELE) AND POSTERIOR REPAIR (RECTOCELE);  Surgeon: Crawford Givens, MD;  Location: [facility];  Service: Gynecology;  Laterality: N/A;  . APPENDECTOMY    . BOWEL RESECTION    . BREAST BIOPSY Left   . COLONOSCOPY    . VIDEO BRONCHOSCOPY WITH ENDOBRONCHIAL ULTRASOUND N/A 12/17/2015   Procedure: VIDEO BRONCHOSCOPY WITH ENDOBRONCHIAL ULTRASOUND;  Surgeon:  Ivin Poot, MD;  Location: [facility] OR;  Service: Thoracic;  Laterality: N/A;    Allergies: Patient has no known allergies.  Medications: Prior to Admission medications   Medication Sig Start Date End Date Taking? Authorizing Provider  albuterol (PROVENTIL HFA;VENTOLIN HFA) 108 (90 Base) MCG/ACT inhaler Inhale 1 puff into the lungs every 4 (four) hours as needed for wheezing or shortness of breath.   Yes Historical Provider, MD  amLODipine (NORVASC) 10 MG tablet Take 10 mg by mouth daily.   Yes Historical Provider, MD  aspirin EC 81 MG tablet Take 81 mg by mouth daily.   Yes Historical Provider, MD  ergocalciferol (VITAMIN D2) 50000 units capsule Take 50,000 Units by mouth once a week.   Yes Historical Provider, MD  furosemide (LASIX) 40 MG tablet Take 40 mg by mouth 2 (two) times daily.   Yes Historical Provider, MD  gabapentin (NEURONTIN) 400 MG capsule Take 400 mg by mouth 2 (two) times daily.   Yes Historical Provider, MD  glipiZIDE (GLUCOTROL) 5 MG tablet Take 5 mg by mouth daily before breakfast.   Yes Historical Provider, MD  labetalol (NORMODYNE) 100 MG tablet Take 100 mg by mouth 2 (two) times daily.   Yes Historical Provider, MD  lisinopril (PRINIVIL,ZESTRIL) 20 MG tablet Take 20 mg by mouth 2 (two) times daily.   Yes Historical Provider, MD  metFORMIN (GLUCOPHAGE) 500 MG  tablet Take 500 mg by mouth 2 (two) times daily with a meal.   Yes Historical Provider, MD  potassium chloride (K-DUR) 10 MEQ tablet Take 10 mEq by mouth daily.   Yes Historical Provider, MD  propranolol (INDERAL) 10 MG tablet Take 10 mg by mouth every 12 (twelve) hours.   Yes Historical Provider, MD  simvastatin (ZOCOR) 20 MG tablet Take 20 mg by mouth at bedtime.    Yes Historical Provider, MD     Family History  Problem Relation Age of Onset  . Hypertension Father   . Diabetes Father     Social History   Social History  . Marital status: Single    Spouse name: N/A  . Number of children: N/A  . Years  of education: N/A   Social History Main Topics  . Smoking status: Former Smoker    Packs/day: 0.50    Years: 40.00    Types: Cigarettes  . Smokeless tobacco: Never Used     Comment: quit about 15-16 years ago; 12/09/2015  . Alcohol use No  . Drug use: No  . Sexual activity: Not Asked   Other Topics Concern  . None   Social History Narrative  . None    Review of Systems: A 12 point ROS discussed  Review of Systems  Constitutional: Negative.   HENT: Negative.   Respiratory: Positive for cough.   Cardiovascular: Negative.   Gastrointestinal: Negative.   Genitourinary: Negative.   Musculoskeletal: Negative.   Skin: Negative.   Neurological: Negative.   Hematological: Negative.   Psychiatric/Behavioral: Negative.     Vital Signs: BP (!) 170/54   Pulse (!) 59   Temp 98.3 F (36.8 C) (Oral)   Resp 18   Ht '5\' 3"'$  (1.6 m)   Wt 145 lb (65.8 kg)   SpO2 99%   BMI 25.69 kg/m   Physical Exam  Constitutional: She is oriented to person, place, and time. She appears well-developed and well-nourished.  HENT:  Head: Normocephalic and atraumatic.  Eyes: EOM are normal.  Neck: Normal range of motion.  Cardiovascular: Normal rate, regular rhythm and normal heart sounds.   Pulmonary/Chest: Effort normal and breath sounds normal. She has no wheezes.  Abdominal: Soft. She exhibits no distension. There is no tenderness.  Musculoskeletal: Normal range of motion.  Neurological: She is alert and oriented to person, place, and time.  Skin: Skin is warm and dry.  Psychiatric: She has a normal mood and affect. Her behavior is normal. Judgment and thought content normal.  Vitals reviewed.   Mallampati Score:  MD Evaluation Airway: WNL Heart: WNL Abdomen: WNL Chest/ Lungs: WNL ASA  Classification: 3 Mallampati/Airway Score: One  Imaging: No results found.  Labs:  CBC:  Recent Labs  12/09/15 1224 10/31/16 0615  WBC 8.2 7.0  HGB 11.2* 11.1*  HCT 36.2 35.7*  PLT 207 203     COAGS:  Recent Labs  12/09/15 1224 10/31/16 0615  INR 1.10 1.01  APTT 32 30    BMP:  Recent Labs  11/03/15 1103 12/09/15 1224  NA  --  141  K  --  4.2  CL  --  104  CO2  --  27  GLUCOSE  --  119*  BUN  --  15  CALCIUM  --  10.0  CREATININE 0.80 0.84  GFRNONAA  --  >60  GFRAA  --  >60    LIVER FUNCTION TESTS:  Recent Labs  12/09/15 1224  BILITOT 0.2*  AST  20  ALT 13*  ALKPHOS 86  PROT 7.4  ALBUMIN 4.2    TUMOR MARKERS: No results for input(s): AFPTM, CEA, CA199, CHROMGRNA in the last 8760 hours.   Imaging:  CLINICAL DATA:  RIGHT upper lobe lung cancer, cough, intermittent shortness of breath, former smoker  EXAM: CT CHEST WITH CONTRAST  TECHNIQUE: Multidetector CT imaging of the chest was performed during intravenous contrast administration. Sagittal and coronal MPR images reconstructed from axial data set.  Creatinine was obtained on site at [facility] at [address]  Results: Creatinine 0.8 mg/dL.  CONTRAST:  75m ISOVUE-300 IOPAMIDOL (ISOVUE-300) INJECTION 61% IV  COMPARISON:  04/26/2016  FINDINGS: Cardiovascular: Atherosclerotic calcifications aorta, coronary arteries, and proximal great vessels. Aorta normal caliber. No pericardial effusion.  Mediastinum/Nodes: Small BILATERAL thyroid nodules up to 9 mm diameter image 18 per unchanged. Upper normal size RIGHT paratracheal node 9 mm short axis image 46 unchanged. Additional normal sized mediastinal nodes. No definite thoracic adenopathy. Small hiatal hernia with esophagus otherwise unremarkable.  Lungs/Pleura: Spiculated RIGHT upper lobe lesion consistent with neoplasm, 2.8 x 1.9 cm image 47 little changed. Small associated cavitary focus. Central peribronchial thickening. Tiny RIGHT upper lobe nodule adjacent to major fissure image 54 unchanged. Nodular thickening RIGHT major fissure 4 mm diameter image 65 unchanged. Tiny nodular density medial LEFT  lower lobe image 78 4 mm diameter unchanged. No pulmonary infiltrate, pleural effusion, or pneumothorax. No new or enlarging pulmonary mass/nodule.  Upper Abdomen: Large cyst upper pole RIGHT kidney 5.4 x 6.1 cm image 126. Slight nodular thickening in LEFT adrenal gland unchanged. Atherosclerotic calcification aorta including origins of the celiac, superior mesenteric and BILATERAL renal arteries.  Musculoskeletal: Osseous demineralization. Scattered degenerative changes thoracic spine. No acute bone lesions.  IMPRESSION: Stable spiculated lesion RIGHT upper lobe.  Stable normal sized mediastinal nodes.  Stable BILATERAL pulmonary nodules and mild central peribronchial thickening.  Aortic atherosclerosis and coronary arterial calcifications as above.   Electronically Signed   By: MLavonia DanaM.D.   On: 09/27/2016 15:08   Assessment and Plan:  Right lung mass  Will proceed with CT guided biopsy today by Dr. YKathlene Cote  Risks and Benefits discussed with the patient including, but not limited to bleeding, hemoptysis, respiratory failure requiring intubation, infection, pneumothorax requiring chest tube placement, stroke from air embolism or even death.  All of the patient's questions were answered, patient is agreeable to proceed. Consent signed and in chart.  Thank you for this interesting consult.  I greatly enjoyed meeting Erin TERSIGNIand look forward to participating in their care.  A copy of this report was sent to the requesting provider on this date.  Electronically Signed: WMurrell ReddenPA-C 10/31/2016, 7:33 AM   I spent a total of  30 Minutes in face to face in clinical consultation, greater than 50% of which was counseling/coordinating care for lung biopsy

## 2016-11-08 ENCOUNTER — Encounter: Payer: Self-pay | Admitting: Cardiothoracic Surgery

## 2016-11-08 ENCOUNTER — Telehealth: Payer: Self-pay | Admitting: *Deleted

## 2016-11-08 ENCOUNTER — Ambulatory Visit (INDEPENDENT_AMBULATORY_CARE_PROVIDER_SITE_OTHER): Payer: Medicare Other | Admitting: Cardiothoracic Surgery

## 2016-11-08 VITALS — BP 135/57 | HR 65 | Resp 18 | Ht 62.5 in | Wt 145.0 lb

## 2016-11-08 DIAGNOSIS — C3411 Malignant neoplasm of upper lobe, right bronchus or lung: Secondary | ICD-10-CM

## 2016-11-08 DIAGNOSIS — R590 Localized enlarged lymph nodes: Secondary | ICD-10-CM

## 2016-11-08 DIAGNOSIS — Z9889 Other specified postprocedural states: Secondary | ICD-10-CM

## 2016-11-08 DIAGNOSIS — R911 Solitary pulmonary nodule: Secondary | ICD-10-CM

## 2016-11-08 NOTE — Telephone Encounter (Signed)
Oncology Nurse Navigator Documentation  Oncology Nurse Navigator Flowsheets 11/08/2016  Navigator Location [facility]  Referral date to RadOnc/MedOnc 11/08/2016  Navigator Encounter Type Telephone/I received referral on Erin Carr today.  I called her and scheduled her for [place] next week 11/16/16 arrive at 3:15.  She verbalized understanding of appt time and place.   Telephone Outgoing Call  Treatment Phase Pre-Tx/Tx Discussion  Barriers/Navigation Needs Coordination of Care  Interventions Coordination of Care  Coordination of Care Appts  Acuity Level 1  Acuity Level 1 Initial guidance, education and coordination as needed  Time Spent with Patient 15

## 2016-11-08 NOTE — Progress Notes (Signed)
PCP is Leola Brazil, MD Referring Provider is Vincente Liberty, MD  Chief Complaint  Patient presents with  . Lung Lesion    f/u to discuss CT Biopsy 10/31/16    [id number]:WRUEAVW returns for discussion of a transthoracic needle biopsy performed last week of a 2.5 cm right upper lobe spiculated nodule. Pathology returned adenocarcinoma of lung. Patient had no complications from the biopsy.  The patient had been evaluated earlier this year with PET scan and bronchoscopy-EBUS for right upper lobe nodule with mild activity on PET scan. PET - CT scan showed mildly enlarged 4R node with mild activity on PET scan. Biopsy was negative. Brushings and washings right upper lobe were with inflammatory findings-reparative cells.  Further scans were performed to follow this lesion which showed no resolution after antibiotics. The biopsy was then scheduled demonstrated adenocarcinoma.  Patient denies any systemic symptoms or weight loss fever weakness headache or bone pain.  She has COPD, diabetes, essential tremor, and at age 82 is not a candidate for surgical resection. The patient referred to the [facility] thoracic oncology clinic for evaluation of possible SBRT. She may need a repeat PET scan. She is not a smoker for several years. Past Medical History:  Diagnosis Date  . Arthritis   . Asthma   . COPD (chronic obstructive pulmonary disease) ([place])   . Diabetes mellitus without complication ([place])    Type II  . Family history of adverse reaction to anesthesia    "aunt had PONV"  . Hypercholesteremia   . Hypertension   . Osteoporosis   . Positive PPD 12/16   PER PCP  . Shortness of breath dyspnea   . Tremors of nervous system    Left hand  . Urinary frequency   . Vitamin D deficiency   . Wears glasses     Past Surgical History:  Procedure Laterality Date  . ABDOMINAL HYSTERECTOMY    . ANTERIOR AND POSTERIOR REPAIR N/A 12/16/2014   Procedure: ANTERIOR (CYSTOCELE) AND POSTERIOR REPAIR  (RECTOCELE);  Surgeon: Crawford Givens, MD;  Location: [facility];  Service: Gynecology;  Laterality: N/A;  . APPENDECTOMY    . BOWEL RESECTION    . BREAST BIOPSY Left   . COLONOSCOPY    . VIDEO BRONCHOSCOPY WITH ENDOBRONCHIAL ULTRASOUND N/A 12/17/2015   Procedure: VIDEO BRONCHOSCOPY WITH ENDOBRONCHIAL ULTRASOUND;  Surgeon: Ivin Poot, MD;  Location: [facility] OR;  Service: Thoracic;  Laterality: N/A;    Family History  Problem Relation Age of Onset  . Hypertension Father   . Diabetes Father     Social History Social History  Substance Use Topics  . Smoking status: Former Smoker    Packs/day: 0.50    Years: 40.00    Types: Cigarettes  . Smokeless tobacco: Never Used     Comment: quit about 15-16 years ago; 12/09/2015  . Alcohol use No    Current Outpatient Prescriptions  Medication Sig Dispense Refill  . albuterol (PROVENTIL HFA;VENTOLIN HFA) 108 (90 Base) MCG/ACT inhaler Inhale 1 puff into the lungs every 4 (four) hours as needed for wheezing or shortness of breath.    Marland Kitchen amLODipine (NORVASC) 10 MG tablet Take 10 mg by mouth daily.    Marland Kitchen aspirin EC 81 MG tablet Take 81 mg by mouth daily.    . ergocalciferol (VITAMIN D2) 50000 units capsule Take 50,000 Units by mouth once a week.    . furosemide (LASIX) 40 MG tablet Take 40 mg by mouth 2 (two) times daily.    Marland Kitchen  gabapentin (NEURONTIN) 400 MG capsule Take 400 mg by mouth 2 (two) times daily.    Marland Kitchen glipiZIDE (GLUCOTROL) 5 MG tablet Take 5 mg by mouth daily before breakfast.    . labetalol (NORMODYNE) 100 MG tablet Take 100 mg by mouth 2 (two) times daily.    Marland Kitchen lisinopril (PRINIVIL,ZESTRIL) 20 MG tablet Take 20 mg by mouth 2 (two) times daily.    . metFORMIN (GLUCOPHAGE) 500 MG tablet Take 500 mg by mouth 2 (two) times daily with a meal.    . potassium chloride (K-DUR) 10 MEQ tablet Take 10 mEq by mouth daily.    . propranolol (INDERAL) 10 MG tablet Take 10 mg by mouth every 12 (twelve) hours.    . simvastatin (ZOCOR) 20 MG tablet Take 20 mg  by mouth at bedtime.     . travoprost, benzalkonium, (TRAVATAN) 0.004 % ophthalmic solution Place 1 drop into both eyes at bedtime.     No current facility-administered medications for this visit.     No Known Allergies  Review of Systems   Shortness of breath with activity No hemoptySIS No weight loss BP (!) 135/57 (BP Location: Right Arm, Patient Position: Sitting, Cuff Size: Large)   Pulse 65   Resp 18   Ht 5' 2.5" (1.588 m)   Wt 145 lb (65.8 kg)   SpO2 99% Comment: RA  BMI 26.10 kg/m  Physical Exam      Exam    General- alert and comfortable, frail chronically ill-appearing female   Lungs- clear without rales, wheezes   Cor- regular rate and rhythm, no murmur , gallop   Abdomen- soft, non-tender   Extremities - warm, non-tender, minimal edema   Neuro- oriented, appropriate, no focal weakness   Diagnostic Tests: Needle biopsy shows adenocarcinoma-report provided patient  Impression: Probable early stage non-small cell carcinoma lung  Plan: Patient will be referred to the thoracic oncology clinic for evaluation for therapy. Patient is not a surgical candidate for resection due to her advanced age 82 COPD and frailty. Len Childs, MD [facility] [phone]

## 2016-11-09 DIAGNOSIS — H2513 Age-related nuclear cataract, bilateral: Secondary | ICD-10-CM | POA: Diagnosis not present

## 2016-11-09 DIAGNOSIS — H25813 Combined forms of age-related cataract, bilateral: Secondary | ICD-10-CM | POA: Diagnosis not present

## 2016-11-09 DIAGNOSIS — H40013 Open angle with borderline findings, low risk, bilateral: Secondary | ICD-10-CM | POA: Diagnosis not present

## 2016-11-09 DIAGNOSIS — H401133 Primary open-angle glaucoma, bilateral, severe stage: Secondary | ICD-10-CM | POA: Diagnosis not present

## 2016-11-10 ENCOUNTER — Encounter: Payer: Self-pay | Admitting: *Deleted

## 2016-11-10 ENCOUNTER — Telehealth: Payer: Self-pay | Admitting: *Deleted

## 2016-11-10 NOTE — Telephone Encounter (Signed)
Oncology Nurse Navigator Documentation  Oncology Nurse Navigator Flowsheets 11/10/2016  Navigator Location [facility]  Navigator Encounter Type Telephone/I called patient to update her on appt time change.  She verbalized understanding of appt time and place.   Telephone Outgoing Call  Treatment Phase Pre-Tx/Tx Discussion  Barriers/Navigation Needs Coordination of Care  Interventions Coordination of Care  Coordination of Care Appts  Acuity Level 1  Acuity Level 1 Minimal follow up required  Time Spent with Patient 15

## 2016-11-10 NOTE — Progress Notes (Signed)
Oncology Nurse Navigator Documentation  Oncology Nurse Navigator Flowsheets 11/10/2016  Navigator Location [facility]  Navigator Encounter Type Other/per thoracic cancer conference, I requested tissue obtained on 10/31/16 to be sent to [facility] for molecular testing. Pathology dept notified me that tissue will be sent on 11/16/16 due to insurance.   Abnormal Finding Date 09/27/2016  Confirmed Diagnosis Date 10/31/2016  Treatment Phase Pre-Tx/Tx Discussion  Barriers/Navigation Needs Coordination of Care  Interventions Coordination of Care  Coordination of Care Other  Acuity Level 2  Time Spent with Patient 30

## 2016-11-13 DIAGNOSIS — M1990 Unspecified osteoarthritis, unspecified site: Secondary | ICD-10-CM | POA: Diagnosis not present

## 2016-11-13 DIAGNOSIS — E559 Vitamin D deficiency, unspecified: Secondary | ICD-10-CM | POA: Diagnosis not present

## 2016-11-13 DIAGNOSIS — E1165 Type 2 diabetes mellitus with hyperglycemia: Secondary | ICD-10-CM | POA: Diagnosis not present

## 2016-11-13 DIAGNOSIS — J4520 Mild intermittent asthma, uncomplicated: Secondary | ICD-10-CM | POA: Diagnosis not present

## 2016-11-13 DIAGNOSIS — Z79899 Other long term (current) drug therapy: Secondary | ICD-10-CM | POA: Diagnosis not present

## 2016-11-13 DIAGNOSIS — E7800 Pure hypercholesterolemia, unspecified: Secondary | ICD-10-CM | POA: Diagnosis not present

## 2016-11-13 DIAGNOSIS — J441 Chronic obstructive pulmonary disease with (acute) exacerbation: Secondary | ICD-10-CM | POA: Diagnosis not present

## 2016-11-13 DIAGNOSIS — C3491 Malignant neoplasm of unspecified part of right bronchus or lung: Secondary | ICD-10-CM | POA: Diagnosis not present

## 2016-11-13 DIAGNOSIS — E113299 Type 2 diabetes mellitus with mild nonproliferative diabetic retinopathy without macular edema, unspecified eye: Secondary | ICD-10-CM | POA: Diagnosis not present

## 2016-11-13 DIAGNOSIS — R251 Tremor, unspecified: Secondary | ICD-10-CM | POA: Diagnosis not present

## 2016-11-13 DIAGNOSIS — I119 Hypertensive heart disease without heart failure: Secondary | ICD-10-CM | POA: Diagnosis not present

## 2016-11-16 ENCOUNTER — Ambulatory Visit (HOSPITAL_BASED_OUTPATIENT_CLINIC_OR_DEPARTMENT_OTHER): Payer: Medicare Other | Admitting: Internal Medicine

## 2016-11-16 ENCOUNTER — Ambulatory Visit: Payer: Medicare Other | Attending: Internal Medicine | Admitting: Physical Therapy

## 2016-11-16 ENCOUNTER — Other Ambulatory Visit (HOSPITAL_COMMUNITY)
Admission: RE | Admit: 2016-11-16 | Discharge: 2016-11-16 | Disposition: A | Discharge status: Home / Self Care | Payer: Medicare Other | Source: Ambulatory Visit | Attending: Internal Medicine | Admitting: Internal Medicine

## 2016-11-16 ENCOUNTER — Ambulatory Visit
Admission: RE | Admit: 2016-11-16 | Discharge: 2016-11-16 | Disposition: A | Discharge status: Home / Self Care | Payer: Medicare Other | Source: Ambulatory Visit | Attending: Radiation Oncology | Admitting: Radiation Oncology

## 2016-11-16 ENCOUNTER — Encounter: Payer: Self-pay | Admitting: *Deleted

## 2016-11-16 ENCOUNTER — Encounter: Payer: Self-pay | Admitting: Internal Medicine

## 2016-11-16 DIAGNOSIS — C3491 Malignant neoplasm of unspecified part of right bronchus or lung: Secondary | ICD-10-CM

## 2016-11-16 DIAGNOSIS — R2689 Other abnormalities of gait and mobility: Secondary | ICD-10-CM | POA: Diagnosis not present

## 2016-11-16 DIAGNOSIS — C3411 Malignant neoplasm of upper lobe, right bronchus or lung: Secondary | ICD-10-CM | POA: Insufficient documentation

## 2016-11-16 DIAGNOSIS — Z87891 Personal history of nicotine dependence: Secondary | ICD-10-CM

## 2016-11-16 DIAGNOSIS — E119 Type 2 diabetes mellitus without complications: Secondary | ICD-10-CM | POA: Diagnosis not present

## 2016-11-16 DIAGNOSIS — I10 Essential (primary) hypertension: Secondary | ICD-10-CM

## 2016-11-16 DIAGNOSIS — Z8042 Family history of malignant neoplasm of prostate: Secondary | ICD-10-CM

## 2016-11-16 DIAGNOSIS — R293 Abnormal posture: Secondary | ICD-10-CM | POA: Insufficient documentation

## 2016-11-16 DIAGNOSIS — M810 Age-related osteoporosis without current pathological fracture: Secondary | ICD-10-CM

## 2016-11-16 DIAGNOSIS — R2681 Unsteadiness on feet: Secondary | ICD-10-CM | POA: Insufficient documentation

## 2016-11-16 DIAGNOSIS — J449 Chronic obstructive pulmonary disease, unspecified: Secondary | ICD-10-CM | POA: Diagnosis not present

## 2016-11-16 DIAGNOSIS — E559 Vitamin D deficiency, unspecified: Secondary | ICD-10-CM

## 2016-11-16 HISTORY — DX: Malignant neoplasm of unspecified part of right bronchus or lung: C34.91

## 2016-11-16 NOTE — Progress Notes (Signed)
[place] Clinical Social Work  Clinical Social Work met with patient/family and Futures trader at [facility] appointment to offer support and assess for psychosocial needs.  Medical oncologist reviewed patient's diagnosis and recommended treatment plan for radiation only with patient/family.  Patient was accompanied by her sister, Erin Carr, and pastor, Erin Carr.  Erin Carr is not married, lives alone- about a block away from her sister.  The patient does not drive and relies on her twin sister, Erin Carr, to drive her to appointments.  Erin Carr shared her emotional concerns were surrounding her diagnosis, but she feels much better after meeting with Dr. Julien Nordmann.  The patient felt information about her diagnosis and treatment had been explained thoroughly.  ONCBCN DISTRESS SCREENING 11/16/2016  Screening Type Initial Screening  Distress experienced in past week (1-10) 5  Practical problem type Transportation  Emotional problem type Nervousness/Anxiety;Adjusting to illness;Feeling hopeless  Information Concerns Type Lack of info about diagnosis;Lack of info about treatment;Lack of info about complementary therapy choices;Lack of info about maintaining fitness  Physical Problem type Sleep/insomnia;Constipation/diarrhea;Tingling hands/feet  Referral to clinical social work Yes    Clinical Social Work briefly discussed Clinical Social Work role and [employer] support programs/services.  Clinical Social Work encouraged patient to call with any additional questions or concerns.   [name], MSW, LCSW, OSW-C Clinical Social Worker [facility] [phone]

## 2016-11-16 NOTE — Progress Notes (Signed)
[place] [phone]   [phone] Multidisciplinary thoracic oncology clinic  CONSULT NOTE  REFERRING PHYSICIAN: Dr. Tharon Aquas Trigt  REASON FOR CONSULTATION:  82 years old African-American female recently diagnosed with lung cancer.  HPI Erin Carr is a 82 y.o. female with past medical history significant for hypertension, dyslipidemia, osteoporosis, diabetes mellitus, osteoarthritis, vitamin D deficiency and long history of smoking but quit 15 years ago. The patient has been complaining of shortness breath for several months. She has reevaluation and November 2016 by her primary care physician for shortness of breath and she had CT scan of the chest performed on 11/03/2015 that showed 2.7 x 1.9 cm right upper lobe nodule in addition to 4 mm left lower lobe nodule. She had a PET scan on 11/18/2015 that showed the slowly enlarging subsided mass in the posterior aspect of the right upper lobe measuring 1.9 x 3.1 cm and demonstrating low-level metabolic activity which was highly concerning for low-grade neoplasm such as a primary bronchogenic carcinoma. At that time there was hypermetabolism within non-enlarged right hilar and low right paratracheal lymph nodes. This was also suspicious for nodal metastasis. She was seen by Dr. Prescott Gum and underwent a video bronchoscopy with washing and brushing of the right upper lobe as well as endobronchial ultrasound and endobronchial biopsy of 4R lymph nodes. The final pathology was not conclusive for any malignancy at that time. The patient was followed by observation and repeat CT scan of the chest with contrast on 09/27/2016 showed the spiculated right upper lobe lesion consistent with neoplasm and measuring 2.8 x 1.9 cm. There was also nodular thickening of the right major fissure 0.4 cm in diameter. The scan also showed changes right paratracheal lymph node measuring 0.9 cm in short axis and additional  normal-sized mediastinal lymph nodes. On 10/31/2016 the patient underwent CT-guided core biopsy of the right upper lobe lung mass by interventional radiology. The final pathology ([id number]) was consistent with adenocarcinoma. Dr. Prescott Gum kindly referred the patient to the multidisciplinary thoracic oncology clinic today for evaluation and recommendation regarding treatment of her condition. When seen today the patient continues to have shortness breath with exertion with mild cough productive of whitish sputum. She denied having any chest pain or hemoptysis. She has no nausea, vomiting, diarrhea or constipation. She denied having any significant weight loss or night sweats. She has no headache or visual changes. Family history significant for a father who had prostate cancer as well as hypertension and diabetes. Mother died during childbirth. The patient is single and has no children. She was accompanied today by her sister Romie Minus and her John Giovanni. She is to work in Ambulance person at [employer]. She has a history of smoking less than one pack per day for around 50 years and quit 15 years ago. She has no history of alcohol or drug abuse.  HPI  Past Medical History:  Diagnosis Date  . Adenocarcinoma of right lung, stage 1 ([place]) 11/16/2016  . Arthritis   . Asthma   . COPD (chronic obstructive pulmonary disease) ([place])   . Diabetes mellitus without complication ([place])    Type II  . Family history of adverse reaction to anesthesia    "aunt had PONV"  . Hypercholesteremia   . Hypertension   . Osteoporosis   . Positive PPD 12/16   PER PCP  . Shortness of breath dyspnea   . Tremors of nervous system    Left hand  .  Urinary frequency   . Vitamin D deficiency   . Wears glasses     Past Surgical History:  Procedure Laterality Date  . ABDOMINAL HYSTERECTOMY    . ANTERIOR AND POSTERIOR REPAIR N/A 12/16/2014   Procedure: ANTERIOR (CYSTOCELE) AND POSTERIOR REPAIR (RECTOCELE);  Surgeon:  Crawford Givens, MD;  Location: [facility];  Service: Gynecology;  Laterality: N/A;  . APPENDECTOMY    . BOWEL RESECTION    . BREAST BIOPSY Left   . COLONOSCOPY    . VIDEO BRONCHOSCOPY WITH ENDOBRONCHIAL ULTRASOUND N/A 12/17/2015   Procedure: VIDEO BRONCHOSCOPY WITH ENDOBRONCHIAL ULTRASOUND;  Surgeon: Ivin Poot, MD;  Location: [facility];  Service: Thoracic;  Laterality: N/A;    Family History  Problem Relation Age of Onset  . Hypertension Father   . Diabetes Father     Social History Social History  Substance Use Topics  . Smoking status: Former Smoker    Packs/day: 0.50    Years: 40.00    Types: Cigarettes  . Smokeless tobacco: Never Used     Comment: quit about 15-16 years ago; 12/09/2015  . Alcohol use No    No Known Allergies  Current Outpatient Prescriptions  Medication Sig Dispense Refill  . amLODipine (NORVASC) 10 MG tablet Take 10 mg by mouth daily.    Marland Kitchen aspirin EC 81 MG tablet Take 81 mg by mouth daily.    . ergocalciferol (VITAMIN D2) 50000 units capsule Take 50,000 Units by mouth once a week.    . furosemide (LASIX) 40 MG tablet Take 40 mg by mouth 2 (two) times daily.    Marland Kitchen gabapentin (NEURONTIN) 400 MG capsule Take 400 mg by mouth 2 (two) times daily.    Marland Kitchen glipiZIDE (GLUCOTROL) 5 MG tablet Take 5 mg by mouth daily before breakfast.    . labetalol (NORMODYNE) 100 MG tablet Take 100 mg by mouth 2 (two) times daily.    Marland Kitchen lisinopril (PRINIVIL,ZESTRIL) 20 MG tablet Take 20 mg by mouth 2 (two) times daily.    . metFORMIN (GLUCOPHAGE) 500 MG tablet Take 500 mg by mouth 2 (two) times daily with a meal.    . potassium chloride (K-DUR) 10 MEQ tablet Take 10 mEq by mouth daily.    . propranolol (INDERAL) 10 MG tablet Take 10 mg by mouth every 12 (twelve) hours.    . simvastatin (ZOCOR) 20 MG tablet Take 20 mg by mouth at bedtime.     . travoprost, benzalkonium, (TRAVATAN) 0.004 % ophthalmic solution Place 1 drop into both eyes at bedtime.    Marland Kitchen albuterol (PROVENTIL HFA;VENTOLIN  HFA) 108 (90 Base) MCG/ACT inhaler Inhale 1 puff into the lungs every 4 (four) hours as needed for wheezing or shortness of breath.     No current facility-administered medications for this visit.     Review of Systems  Constitutional: positive for fatigue Eyes: negative Ears, nose, mouth, throat, and face: negative Respiratory: positive for cough and dyspnea on exertion Cardiovascular: negative Gastrointestinal: negative Genitourinary:negative Integument/breast: negative Hematologic/lymphatic: negative Musculoskeletal:positive for muscle weakness Neurological: negative Behavioral/Psych: negative Endocrine: negative Allergic/Immunologic: negative  Physical Exam  GUR:KYHCW, healthy, no distress, well nourished and well developed SKIN: skin color, texture, turgor are normal, no rashes or significant lesions HEAD: Normocephalic, No masses, lesions, tenderness or abnormalities EYES: normal, PERRLA, Conjunctiva are pink and non-injected EARS: External ears normal, Canals clear OROPHARYNX:no exudate, no erythema and lips, buccal mucosa, and tongue normal  NECK: supple, no adenopathy, no JVD LYMPH:  no palpable lymphadenopathy, no hepatosplenomegaly BREAST:not  examined LUNGS: clear to auscultation , and palpation HEART: regular rate & rhythm, no murmurs and no gallops ABDOMEN:abdomen soft, non-tender, normal bowel sounds and no masses or organomegaly BACK: Back symmetric, no curvature., No CVA tenderness EXTREMITIES:no joint deformities, effusion, or inflammation, no edema, no skin discoloration  NEURO: alert & oriented x 3 with fluent speech, no focal motor/sensory deficits  PERFORMANCE STATUS: ECOG 1  LABORATORY DATA: Lab Results  Component Value Date   WBC 7.0 11/25/16   HGB 11.1 (L) 2016-11-25   HCT 35.7 (L) 11/25/16   MCV 82.6 11/25/2016   PLT 203 2016-11-25      Chemistry      Component Value Date/Time   NA 141 12/09/2015 1224   K 4.2 12/09/2015 1224   CL  104 12/09/2015 1224   CO2 27 12/09/2015 1224   BUN 15 12/09/2015 1224   CREATININE 0.84 12/09/2015 1224      Component Value Date/Time   CALCIUM 10.0 12/09/2015 1224   ALKPHOS 86 12/09/2015 1224   AST 20 12/09/2015 1224   ALT 13 (L) 12/09/2015 1224   BILITOT 0.2 (L) 12/09/2015 1224       RADIOGRAPHIC STUDIES: Ct Biopsy  Result Date: 11/25/2016 CLINICAL DATA:  Slowly enlarging spiculated right upper lobe lung mass with low level metabolic activity by PET scan. The patient now presents for percutaneous biopsy after previous bronchoscopy did not yield evidence of malignancy. EXAM: CT GUIDED CORE BIOPSY OF RIGHT UPPER LOBE LUNG MASS ANESTHESIA/SEDATION: 1.5 mg IV Versed; 75 mcg IV Fentanyl Total Moderate Sedation Time:  30 minutes. The patient's level of consciousness and physiologic status were continuously monitored during the procedure by Radiology nursing. PROCEDURE: The procedure risks, benefits, and alternatives were explained to the patient. Questions regarding the procedure were encouraged and answered. The patient understands and consents to the procedure. A time-out was performed prior to initiating the procedure. The posterior right chest wall was prepped with chlorhexidine in a sterile fashion, and a sterile drape was applied covering the operative field. A sterile gown and sterile gloves were used for the procedure. Local anesthesia was provided with 1% Lidocaine. CT imaging was performed through the chest in a prone position. Utilizing CT fluoroscopic guidance, a 17 gauge needle was advanced into the right posterior lung. After confirming needle tip position, a single 18 gauge coaxial core biopsy sample was obtained of a right upper lobe lung lesion. Material was submitted in formalin. The Biosentry device was utilized in deploying a pleural plug at the needle entry site. COMPLICATIONS: None FINDINGS: Irregular spiculated lesion in the posterior and inferior aspect of the right upper  lobe was localized. There was difficulty advancing the outer 17 gauge needle due to overlying rib. Ultimately, a needle was able to be advanced to the posterior margin of the lesion, allowing a core biopsy sample to be obtained. Post biopsy imaging shows no pneumothorax or pulmonary hemorrhage. IMPRESSION: CT-guided core biopsy performed of a right upper lobe lung lesion. Electronically Signed   By: Aletta Edouard M.D.   On: 2016-11-25 11:21    ASSESSMENT: This is a very pleasant 82 years old African-American female recently diagnosed with stage IA (T1b, N0, M0) non-small cell lung cancer, adenocarcinoma presented with right upper lobe lung nodule diagnosed in November 2017.   PLAN: I had a lengthy discussion with the patient and her family today about her current condition, prognosis and treatment options. Previous PET scan a year ago showed suspicious hypermetabolism in the mediastinal lymph node but  the recent CT scan of the chest showed no evidence for mediastinal lymphadenopathy at this point. The patient is not a good surgical candidate because of her age and comorbidities. I recommended for her to see Dr. Sondra Come later today for consideration of curative radiotherapy to the right upper lobe lung mass likely with SBRT. I will arrange for the patient to come back for follow-up visit in 6 months for evaluation with repeat CT scan of the chest for restaging of her disease after completion of her treatment. The patient and her family agreed to the current plan. The patient was seen during the multidisciplinary thoracic oncology clinic by medical oncology, radiation oncology, thoracic navigator, social worker and physical therapist. She was advised to call immediately if she has any concerning symptoms in the interval.  The patient voices understanding of current disease status and treatment options and is in agreement with the current care plan.  All questions were answered. The patient knows to  call the clinic with any problems, questions or concerns. We can certainly see the patient much sooner if necessary.  Thank you so much for allowing me to participate in the care of Erin Carr. I will continue to follow up the patient with you and assist in her care.  I spent 40 minutes counseling the patient face to face. The total time spent in the appointment was 60 minutes.  Disclaimer: This note was dictated with voice recognition software. Similar sounding words can inadvertently be transcribed and may not be corrected upon review.   Brand Siever K. November 16, 2016, 3:58 PM

## 2016-11-16 NOTE — Progress Notes (Signed)
Radiation Oncology         [phone] ________________________________  Initial Outpatient Consultation  Name: Erin Carr MRN: [id number]  Date: 11/16/2016  DOB: 1930/01/19  [id number] Abran Cantor, MD  Prescott Gum, Collier Salina, MD    REFERRING PHYSICIAN: Prescott Gum, Collier Salina, MD  DIAGNOSIS: Stage 1 non-small cell lung cancer (adenocarcinoma)  HISTORY OF PRESENT ILLNESS::Erin Carr is a 82 y.o. female who has a history of right upper lobe spiculated density approximately 2.5 cm since 2016. Transthoracic needle biopsy of the nodule revealed adenocarcinoma of the lung. CT chest with contrast on 09/27/16 revealed stable spiculated lesion right upper lobe, stable bilateral pulmonary nodules and mild central peribronchial thickening, and stable normal sized mediastinal nodes.  Patient was evaluated by thoracic surgery and due to the patient's age,  COPD and frailty she was not felt to be a candidate for surgical intervention.  Patient notes a good appetite and no weight changes.     PREVIOUS RADIATION THERAPY: No  PAST MEDICAL HISTORY:  has a past medical history of Adenocarcinoma of right lung, stage 1 ([place]) (11/16/2016); Arthritis; Asthma; COPD (chronic obstructive pulmonary disease) ([place]); Diabetes mellitus without complication ([place]); Family history of adverse reaction to anesthesia; Hypercholesteremia; Hypertension; Osteoporosis; Positive PPD (12/16); Shortness of breath dyspnea; Tremors of nervous system; Urinary frequency; Vitamin D deficiency; and Wears glasses.    PAST SURGICAL HISTORY: Past Surgical History:  Procedure Laterality Date  . ABDOMINAL HYSTERECTOMY    . ANTERIOR AND POSTERIOR REPAIR N/A 12/16/2014   Procedure: ANTERIOR (CYSTOCELE) AND POSTERIOR REPAIR (RECTOCELE);  Surgeon: Crawford Givens, MD;  Location: [facility];  Service: Gynecology;  Laterality: N/A;  . APPENDECTOMY    . BOWEL RESECTION    . BREAST BIOPSY Left   . COLONOSCOPY    . VIDEO BRONCHOSCOPY WITH  ENDOBRONCHIAL ULTRASOUND N/A 12/17/2015   Procedure: VIDEO BRONCHOSCOPY WITH ENDOBRONCHIAL ULTRASOUND;  Surgeon: Ivin Poot, MD;  Location: [facility];  Service: Thoracic;  Laterality: N/A;    FAMILY HISTORY: family history includes Diabetes in her father; Hypertension in her father.  SOCIAL HISTORY:  reports that she has quit smoking. Her smoking use included Cigarettes. She has a 20.00 pack-year smoking history. She has never used smokeless tobacco. She reports that she does not drink alcohol or use drugs.  ALLERGIES: Patient has no known allergies.  MEDICATIONS:  Current Outpatient Prescriptions  Medication Sig Dispense Refill  . albuterol (PROVENTIL HFA;VENTOLIN HFA) 108 (90 Base) MCG/ACT inhaler Inhale 1 puff into the lungs every 4 (four) hours as needed for wheezing or shortness of breath.    Marland Kitchen amLODipine (NORVASC) 10 MG tablet Take 10 mg by mouth daily.    Marland Kitchen aspirin EC 81 MG tablet Take 81 mg by mouth daily.    . ergocalciferol (VITAMIN D2) 50000 units capsule Take 50,000 Units by mouth once a week.    . furosemide (LASIX) 40 MG tablet Take 40 mg by mouth 2 (two) times daily.    Marland Kitchen gabapentin (NEURONTIN) 400 MG capsule Take 400 mg by mouth 2 (two) times daily.    Marland Kitchen glipiZIDE (GLUCOTROL) 5 MG tablet Take 5 mg by mouth daily before breakfast.    . labetalol (NORMODYNE) 100 MG tablet Take 100 mg by mouth 2 (two) times daily.    Marland Kitchen lisinopril (PRINIVIL,ZESTRIL) 20 MG tablet Take 20 mg by mouth 2 (two) times daily.    . metFORMIN (GLUCOPHAGE) 500 MG tablet Take 500 mg by mouth 2 (two) times daily with a meal.    .  potassium chloride (K-DUR) 10 MEQ tablet Take 10 mEq by mouth daily.    . propranolol (INDERAL) 10 MG tablet Take 10 mg by mouth every 12 (twelve) hours.    . simvastatin (ZOCOR) 20 MG tablet Take 20 mg by mouth at bedtime.     . travoprost, benzalkonium, (TRAVATAN) 0.004 % ophthalmic solution Place 1 drop into both eyes at bedtime.     No current facility-administered  medications for this encounter.     REVIEW OF SYSTEMS:  A 12 point review of systems is documented in the electronic medical record. This was obtained by the nursing staff. However, I reviewed this with the patient to discuss relevant findings and make appropriate changes.  No chest pain cough or hemoptysis. She denies any headaches   PHYSICAL EXAM:  vitals were not taken for this visit.  Vitals with BMI 11/16/2016  Height '5\' 1"'$   Weight 151 lbs 8 oz  BMI 78.9  Systolic 381  Diastolic 52  Pulse 67   General: Alert and oriented, in no acute distress HEENT: Head is normocephalic. Extraocular movements are intact. Oropharynx is clear. Edentulous Neck: Neck is supple, no palpable cervical or supraclavicular lymphadenopathy. Heart: Regular in rate and rhythm with no murmurs, rubs, or gallops. Chest: Clear to auscultation bilaterally, with no rhonchi, wheezes, or rales. Abdomen: Soft, nontender, nondistended, with no rigidity or guarding. Extremities: No cyanosis or edema. Lymphatics: see Neck Exam Skin: No concerning lesions. Musculoskeletal: symmetric strength and muscle tone throughout. Neurologic: Cranial nerves II through XII are grossly intact. No obvious focalities. Speech is fluent. Coordination is intact. Psychiatric: Judgment and insight are intact. Affect is appropriate.   ECOG = 1   LABORATORY DATA:  Lab Results  Component Value Date   WBC 7.0 10/31/2016   HGB 11.1 (L) 10/31/2016   HCT 35.7 (L) 10/31/2016   MCV 82.6 10/31/2016   PLT 203 10/31/2016   NEUTROABS 7.5 12/17/2014   Lab Results  Component Value Date   NA 141 12/09/2015   K 4.2 12/09/2015   CL 104 12/09/2015   CO2 27 12/09/2015   GLUCOSE 119 (H) 12/09/2015   CREATININE 0.84 12/09/2015   CALCIUM 10.0 12/09/2015      RADIOGRAPHY: Ct Biopsy  Result Date: 10/31/2016 CLINICAL DATA:  Slowly enlarging spiculated right upper lobe lung mass with low level metabolic activity by PET scan. The patient now  presents for percutaneous biopsy after previous bronchoscopy did not yield evidence of malignancy. EXAM: CT GUIDED CORE BIOPSY OF RIGHT UPPER LOBE LUNG MASS ANESTHESIA/SEDATION: 1.5 mg IV Versed; 75 mcg IV Fentanyl Total Moderate Sedation Time:  30 minutes. The patient's level of consciousness and physiologic status were continuously monitored during the procedure by Radiology nursing. PROCEDURE: The procedure risks, benefits, and alternatives were explained to the patient. Questions regarding the procedure were encouraged and answered. The patient understands and consents to the procedure. A time-out was performed prior to initiating the procedure. The posterior right chest wall was prepped with chlorhexidine in a sterile fashion, and a sterile drape was applied covering the operative field. A sterile gown and sterile gloves were used for the procedure. Local anesthesia was provided with 1% Lidocaine. CT imaging was performed through the chest in a prone position. Utilizing CT fluoroscopic guidance, a 17 gauge needle was advanced into the right posterior lung. After confirming needle tip position, a single 18 gauge coaxial core biopsy sample was obtained of a right upper lobe lung lesion. Material was submitted in formalin. The [place] device  was utilized in deploying a pleural plug at the needle entry site. COMPLICATIONS: None FINDINGS: Irregular spiculated lesion in the posterior and inferior aspect of the right upper lobe was localized. There was difficulty advancing the outer 17 gauge needle due to overlying rib. Ultimately, a needle was able to be advanced to the posterior margin of the lesion, allowing a core biopsy sample to be obtained. Post biopsy imaging shows no pneumothorax or pulmonary hemorrhage. IMPRESSION: CT-guided core biopsy performed of a right upper lobe lung lesion. Electronically Signed   By: Aletta Edouard M.D.   On: 10/31/2016 11:21      IMPRESSION: Stage 1 non-small cell lung cancer  (adenocarcinoma). The patient has been seen by thoracic surgery. In light of her age and medical history she is not a good candidate for surgical resection. She would be a good candidate for SBRT directed to the lesion.  We discussed the course of treatment, side effects, and toxicities of radiation. Patient appears to understand and agrees to proceed with treatment.  PLAN: Order a PET scan prior to proceeding with treatments since her last PET scan was approximately a year ago. Anticipate SBRT starting in early January.       ------------------------------------------------  Blair Promise, PhD, MD  This document serves as a record of services personally performed by Gery Pray, MD. It was created on his behalf by Bethann Humble, a trained medical scribe. The creation of this record is based on the scribe's personal observations and the provider's statements to them. This document has been checked and approved by the attending provider.

## 2016-11-16 NOTE — Therapy (Signed)
[place], [place], [address] Phone: [phone]   Fax:  [phone]  Physical Therapy Evaluation  Patient Details  Name: Erin Carr MRN: [id number] Date of Birth: 10-04-1930 Referring Provider: Dr. Curt Bears  Encounter Date: 11/16/2016      PT End of Session - 11/16/16 1554    Visit Number 1   Number of Visits 1   PT Start Time 1520   PT Stop Time 1537   PT Time Calculation (min) 17 min   Activity Tolerance Patient tolerated treatment well   Behavior During Therapy [facility] for tasks assessed/performed      Past Medical History:  Diagnosis Date  . Adenocarcinoma of right lung, stage 1 ([place]) 11/16/2016  . Arthritis   . Asthma   . COPD (chronic obstructive pulmonary disease) ([place])   . Diabetes mellitus without complication ([place])    Type II  . Family history of adverse reaction to anesthesia    "aunt had PONV"  . Hypercholesteremia   . Hypertension   . Osteoporosis   . Positive PPD 12/16   PER PCP  . Shortness of breath dyspnea   . Tremors of nervous system    Left hand  . Urinary frequency   . Vitamin D deficiency   . Wears glasses     Past Surgical History:  Procedure Laterality Date  . ABDOMINAL HYSTERECTOMY    . ANTERIOR AND POSTERIOR REPAIR N/A 12/16/2014   Procedure: ANTERIOR (CYSTOCELE) AND POSTERIOR REPAIR (RECTOCELE);  Surgeon: Crawford Givens, MD;  Location: [facility];  Service: Gynecology;  Laterality: N/A;  . APPENDECTOMY    . BOWEL RESECTION    . BREAST BIOPSY Left   . COLONOSCOPY    . VIDEO BRONCHOSCOPY WITH ENDOBRONCHIAL ULTRASOUND N/A 12/17/2015   Procedure: VIDEO BRONCHOSCOPY WITH ENDOBRONCHIAL ULTRASOUND;  Surgeon: Ivin Poot, MD;  Location: [facility];  Service: Thoracic;  Laterality: N/A;    There were no vitals filed for this visit.       Subjective Assessment - 11/16/16 1542    Subjective Says she uses a cane just when walking outside the home to  steady herself.   Patient is accompained by: Family member  sister; also pastor   Pertinent History Patient had been followed due to h/o pulmonary nodules; CT chest and biopsy found right upper lobe adenocarcinoma approx. 2x2 cm.  Pt. needs another PET scan; expected to have XRT.  She is an ex-smoker after 20 pack-years.  PPD+ in 11/2015.  DM, osteoporosis; left hand tremors, asthma, COPD, dyspnea; arthritis, HTN; h/o bowel resection and left breast biopsy.   Patient Stated Goals get info from all lung clinic providers   Currently in Pain? No/denies            [facility] PT Assessment - 11/16/16 0001      Assessment   Medical Diagnosis right upper lobe lung adenocarcinoma   Referring Provider Dr. Curt Bears   Onset Date/Surgical Date 09/27/16   Prior Therapy none     Precautions   Precautions Fall;Other (comment)   Precaution Comments cancer precautions, fall risk     Restrictions   Weight Bearing Restrictions No     Balance Screen   Has the patient fallen in the past 6 months No   Has the patient had a decrease in activity level because of a fear of falling?  No   Is the patient reluctant to leave their home because of a fear of falling?  No  uses cane outside the home     [place] One level     Prior Function   Level of Independence Independent with community mobility with device   Leisure no regular exercise     Cognition   Overall Cognitive Status Difficult to assess     Observation/Other Assessments   Observations well looking older woman with left hand tremor when she tries to sign her name     Coordination   Fine Motor Movements are Fluid and Coordinated No  tremor     Functional Tests   Functional tests Sit to Stand     Sit to Stand   Comments 9 times in 30 seconds, about average for age     Posture/Postural Control   Posture/Postural  Control Postural limitations   Postural Limitations Forward head;Increased thoracic kyphosis     ROM / Strength   AROM / PROM / Strength AROM     AROM   Overall AROM  Within functional limits for tasks performed   Overall AROM Comments for standing trunk AROM  mild loss of balance with rotation motions     Ambulation/Gait   Ambulation/Gait Yes   Ambulation/Gait Assistance 6: Modified independent (Device/Increase time)   Assistive device Straight cane   Gait Comments she reports she can negotiate stairs  not tested today     Balance   Balance Assessed Yes     Dynamic Standing Balance   Dynamic Standing - Comments reaches 6 inches forward in standing, below average for age                           PT Education - 11/16/16 1554    Education provided Yes   Education Details energy conservation, walking, Cure article on staying active, posture, breathing, PT info   Person(s) Educated Patient;Other (comment)  sister and pastor   Methods Explanation;Handout   Comprehension Verbalized understanding               Lung Clinic Goals - 11/16/16 1559      Patient will be able to verbalize understanding of the benefit of exercise to decrease fatigue.   Status Achieved     Patient will be able to verbalize the importance of posture.   Status Achieved     Patient will be able to demonstrate diaphragmatic breathing for improved lung function.   Status Achieved     Patient will be able to verbalize understanding of the role of physical therapy to prevent functional decline and who to contact if physical therapy is needed.   Status Achieved             Plan - 11/16/16 1555    Clinical Impression Statement Case was discussed at multidisciplinary care conference.  Patient with diagnosis of adenocarcinoma and expected to have XRT to treat this.  She walks with a cane and shows balance deficits and abnormal posture (forward head, thoracic kyphosis).  Eval  is moderate due to comorbidities of DM, osteoporosis, asthma, dyspnea; evolving because she is embarking on cancer treatment.   Rehab Potential Good   PT Frequency One time visit   PT Treatment/Interventions Patient/family education   PT Next Visit Plan None at this time, but she may benefit from therapy to improve her balance and gait safety as she does or completes  cancer treatment.   PT Home Exercise Plan walking, posture, breathing exercise   Consulted and Agree with Plan of Care Patient      Patient will benefit from skilled therapeutic intervention in order to improve the following deficits and impairments:  Decreased balance, Decreased mobility, Postural dysfunction, Difficulty walking  Visit Diagnosis: Unsteadiness on feet - Plan: PT plan of care cert/re-cert  Abnormal posture - Plan: PT plan of care cert/re-cert  Other abnormalities of gait and mobility - Plan: PT plan of care cert/re-cert      G-Codes - 37/09/64 1600    Functional Assessment Tool Used clinical judgement   Functional Limitation Mobility: Walking and moving around   Mobility: Walking and Moving Around Current Status [phone]) At least 1 percent but less than 20 percent impaired, limited or restricted   Mobility: Walking and Moving Around Goal Status [phone]) At least 1 percent but less than 20 percent impaired, limited or restricted   Mobility: Walking and Moving Around Discharge Status [phone]) At least 1 percent but less than 20 percent impaired, limited or restricted       Problem List Patient Active Problem List   Diagnosis Date Noted  . Adenocarcinoma of right lung, stage 1 ([place]) 11/16/2016  . Neoplasm of uncertain behavior of right upper lobe of lung 12/01/2015  . Positive PPD   . Pelvic prolapse 12/16/2014    SALISBURY,DONNA 11/16/2016, 4:02 PM  [place], [place], [address] Phone: [phone]   Fax:   [phone]  Name: VELTA ROCKHOLT MRN: [id number] Date of Birth: [birth year]  Serafina Royals, PT 11/16/16 4:03 PM

## 2016-11-17 ENCOUNTER — Telehealth: Payer: Self-pay | Admitting: *Deleted

## 2016-11-17 ENCOUNTER — Encounter: Payer: Self-pay | Admitting: *Deleted

## 2016-11-17 NOTE — Telephone Encounter (Signed)
Called patient to inform of Pet Scan on 12-01-16- arrival time - 12:30 pm @ [facility], patient to be NPO - 6 hrs. Prior to test , pt. To hold morning diabetes medication, spoke with patient and she is aware of this test

## 2016-11-17 NOTE — Progress Notes (Signed)
Oncology Nurse Navigator Documentation  Oncology Nurse Navigator Flowsheets 11/17/2016  Navigator Location [facility]  Navigator Encounter Type Clinic/MDC;Letter/Fax/Email/I spoke with patient and family yesterday at [facility].  I gave and explained information on lung cancer, resources and support services at [facility], and next steps.   Today, I contacted Rad Onc to get PET scan authorized.    [facility] Date 11/16/2016  Patient Visit Type MedOnc  Treatment Phase Pre-Tx/Tx Discussion  Barriers/Navigation Needs Coordination of Care;Education  Education Newly Diagnosed Cancer Education;Other  Interventions Coordination of Care;Education  Coordination of Care Appts;Radiology  Education Method Verbal;Written  Acuity Level 2  Acuity Level 2 Educational needs;Assistance expediting appointments  Time Spent with Patient 75

## 2016-11-21 ENCOUNTER — Telehealth: Payer: Self-pay | Admitting: *Deleted

## 2016-11-21 NOTE — Telephone Encounter (Signed)
CALLED PATIENT TO INFORM OF FU APPT. ON 12-06-16 @ 2:15 PM WITH DR. KINARD, SPOKE WITH PATIENT AND SHE IS AWARE OF THIS APPT.

## 2016-11-30 ENCOUNTER — Telehealth: Payer: Self-pay | Admitting: General Practice

## 2016-11-30 NOTE — Telephone Encounter (Signed)
No answer/unable to leave msg regarding June 2018 appts.

## 2016-12-01 ENCOUNTER — Encounter (HOSPITAL_COMMUNITY)
Admission: RE | Admit: 2016-12-01 | Discharge: 2016-12-01 | Disposition: A | Discharge status: Home / Self Care | Payer: Medicare Other | Source: Ambulatory Visit | Attending: Radiation Oncology | Admitting: Radiation Oncology

## 2016-12-01 DIAGNOSIS — C3491 Malignant neoplasm of unspecified part of right bronchus or lung: Secondary | ICD-10-CM | POA: Insufficient documentation

## 2016-12-01 DIAGNOSIS — C3411 Malignant neoplasm of upper lobe, right bronchus or lung: Secondary | ICD-10-CM | POA: Diagnosis not present

## 2016-12-01 LAB — GLUCOSE, CAPILLARY: Glucose-Capillary: 150 mg/dL — ABNORMAL HIGH (ref 65–99)

## 2016-12-01 MED ORDER — FLUDEOXYGLUCOSE F - 18 (FDG) INJECTION: 7.7000 | Freq: Once | INTRAVENOUS | Status: DC | PRN

## 2016-12-06 ENCOUNTER — Ambulatory Visit
Admission: RE | Admit: 2016-12-06 | Discharge: 2016-12-06 | Disposition: A | Discharge status: Home / Self Care | Payer: Medicare Other | Source: Ambulatory Visit | Attending: Radiation Oncology | Admitting: Radiation Oncology

## 2016-12-06 ENCOUNTER — Encounter: Payer: Self-pay | Admitting: Radiation Oncology

## 2016-12-06 ENCOUNTER — Ambulatory Visit: Payer: Medicare Other | Attending: Radiation Oncology

## 2016-12-06 DIAGNOSIS — Z712 Person consulting for explanation of examination or test findings: Secondary | ICD-10-CM | POA: Diagnosis not present

## 2016-12-06 DIAGNOSIS — C3491 Malignant neoplasm of unspecified part of right bronchus or lung: Secondary | ICD-10-CM

## 2016-12-06 DIAGNOSIS — Z7984 Long term (current) use of oral hypoglycemic drugs: Secondary | ICD-10-CM | POA: Diagnosis not present

## 2016-12-06 DIAGNOSIS — C3411 Malignant neoplasm of upper lobe, right bronchus or lung: Secondary | ICD-10-CM | POA: Insufficient documentation

## 2016-12-06 DIAGNOSIS — Z7982 Long term (current) use of aspirin: Secondary | ICD-10-CM | POA: Diagnosis not present

## 2016-12-06 DIAGNOSIS — Z79899 Other long term (current) drug therapy: Secondary | ICD-10-CM | POA: Diagnosis not present

## 2016-12-06 DIAGNOSIS — R05 Cough: Secondary | ICD-10-CM | POA: Diagnosis not present

## 2016-12-06 DIAGNOSIS — Z5189 Encounter for other specified aftercare: Secondary | ICD-10-CM | POA: Diagnosis not present

## 2016-12-06 DIAGNOSIS — C3490 Malignant neoplasm of unspecified part of unspecified bronchus or lung: Secondary | ICD-10-CM | POA: Diagnosis not present

## 2016-12-06 NOTE — Progress Notes (Signed)
Radiation Oncology         [phone] ________________________________  Name: Erin Carr MRN: [id number]  Date: 12/06/2016  DOB: [birth year]  Re-evaluation Note  CC: Leola Brazil, MD  Ivin Poot, MD   Diagnosis:  Stage 1 non-small cell lung cancer (adenocarcinoma)  Narrative:  The patient returns today for re-evaluation. She is here to review the results of her PET scan from 12/01/16. She reports an occasional dry cough. She denies pain, shortness of breath, or hemoptysis.                          ALLERGIES:  has No Known Allergies.  Meds: Current Outpatient Prescriptions  Medication Sig Dispense Refill  . albuterol (PROVENTIL HFA;VENTOLIN HFA) 108 (90 Base) MCG/ACT inhaler Inhale 1 puff into the lungs every 4 (four) hours as needed for wheezing or shortness of breath.    Marland Kitchen amLODipine (NORVASC) 10 MG tablet Take 10 mg by mouth daily.    Marland Kitchen aspirin EC 81 MG tablet Take 81 mg by mouth daily.    . ergocalciferol (VITAMIN D2) 50000 units capsule Take 50,000 Units by mouth once a week.    . furosemide (LASIX) 40 MG tablet Take 40 mg by mouth 2 (two) times daily.    Marland Kitchen gabapentin (NEURONTIN) 400 MG capsule Take 400 mg by mouth 2 (two) times daily.    Marland Kitchen glipiZIDE (GLUCOTROL) 5 MG tablet Take 5 mg by mouth daily before breakfast.    . labetalol (NORMODYNE) 100 MG tablet Take 100 mg by mouth 2 (two) times daily.    Marland Kitchen lisinopril (PRINIVIL,ZESTRIL) 20 MG tablet Take 20 mg by mouth 2 (two) times daily.    . metFORMIN (GLUCOPHAGE) 500 MG tablet Take 500 mg by mouth 2 (two) times daily with a meal.    . potassium chloride (K-DUR) 10 MEQ tablet Take 10 mEq by mouth daily.    . propranolol (INDERAL) 10 MG tablet Take 10 mg by mouth every 12 (twelve) hours.    . simvastatin (ZOCOR) 20 MG tablet Take 20 mg by mouth at bedtime.     . travoprost, benzalkonium, (TRAVATAN) 0.004 % ophthalmic solution Place 1 drop into both eyes at bedtime.     No current facility-administered  medications for this encounter.    Facility-Administered Medications Ordered in Other Encounters  Medication Dose Route Frequency Provider Last Rate Last Dose  . fludeoxyglucose F - 18 (FDG) injection 7.7 millicurie  7.7 millicurie Intravenous Once PRN Gaspar Cola, MD        Physical Findings: The patient is in no acute distress. Patient is alert and oriented.  height is '5\' 1"'$  (1.549 m) and weight is 153 lb 6.4 oz (69.6 kg). Her oral temperature is 98.2 F (36.8 C). Her blood pressure is 156/60 (abnormal) and her pulse is 58 (abnormal). Her oxygen saturation is 100%. .  No significant changes. Patient is ambulatory with a cane. Lungs are clear to auscultation bilaterally. Heart has regular rate and rhythm. No palpable cervical, supraclavicular, or axillary adenopathy. Abdomen soft, non-tender, normal bowel sounds.  Lab Findings: Lab Results  Component Value Date   WBC 7.0 10/31/2016   HGB 11.1 (L) 10/31/2016   HCT 35.7 (L) 10/31/2016   MCV 82.6 10/31/2016   PLT 203 10/31/2016    Radiographic Findings: Nm Pet Image Restag (ps) Skull Base To Thigh  Result Date: 12/01/2016 CLINICAL DATA:  Initial treatment strategy for lung carcinoma. RIGHT upper lobe adenocarcinoma.  EXAM: NUCLEAR MEDICINE PET SKULL BASE TO THIGH TECHNIQUE: 7.7 mCi F-18 FDG was injected intravenously. Full-ring PET imaging was performed from the skull base to thigh after the radiotracer. CT data was obtained and used for attenuation correction and anatomic localization. FASTING BLOOD GLUCOSE:  Value: 151 mg/dl COMPARISON:  PET  CT scan 11/18/2015 FINDINGS: NECK Hypermetabolic activity within the LEFT lobe of thyroid gland is intense with SUV max equal 6.3 but not changed from 6.5 on comparison exam. CHEST Ground-glass nodule measuring 2 cm in the RIGHT upper lobe with SUV max equal 1.6 is a similar to 2 cm on comparison exam. Similar metabolic activity. Small nodule in the medial LEFT lower lobe measuring 6 mm (image 49,  series 8) is not changed. The hypermetabolic RIGHT lower paratracheal lymph node unchanged in size or metabolic activity with SUV max equal 4.4 compared with 4.3. Lymph node measures 7 mm short axis unchanged. No new mediastinal adenopathy. Atherosclerotic calcification aorta. ABDOMEN/PELVIS No abnormal hypermetabolic activity within the liver, pancreas, adrenal glands, or spleen. No hypermetabolic lymph nodes in the abdomen or pelvis. Midline hernia noted insert is wide-mouth (image 123, series 4). Atherosclerotic calcification of the aorta. Post hysterectomy SKELETON No focal hypermetabolic activity to suggest skeletal metastasis. IMPRESSION: 1. No significant change in mildly hypermetabolic ground-glass nodule in the RIGHT upper lobe. Biopsy-proven adenocarcinoma. 2. No interval change in moderate metabolic hypermetabolic RIGHT lower paratracheal lymph node. Favor reactive node. 3. Stable small LEFT lower lobe pulmonary nodule. 4.  Atherosclerotic calcification of the aorta. 5. Persistent metabolic activity within the LEFT lobe of thyroid gland. Cannot exclude thyroid carcinoma. Electronically Signed   By: Suzy Bouchard M.D.   On: 12/01/2016 15:07    Impression: Stage 1 non-small cell lung cancer (adenocarcinoma). Patient had repeat PET scna on 12/01/16. This scan showed  no new findings, and there was still some metabolic activity in the area of concern. The patient would be a good candidate for SBRT treatment. We discussed the course of treatment, side effects, and potential toxicities with the patient. The patient appears to understand and wishes to proceed with treatment. A consent form was signed and a copy was placed in the patient's chart.  Plan: CT simulation and planning is scheduled for 12/08/16 at 10 am. Treatment will begin in approximately 1.5 weeks. Anticipate 3 SBRT treatments.  ____________________________________   This document serves as a record of services personally performed by  Gery Pray, MD. It was created on his behalf by Bethann Humble, a trained medical scribe. The creation of this record is based on the scribe's personal observations and the provider's statements to them. This document has been checked and approved by the attending provider.

## 2016-12-06 NOTE — Progress Notes (Signed)
Italia Wolfert is here with her twin sister for follow up.  She denies having pain or shortness of breast.  She reports having an occasional dry cough.  She denies having any hemoptysis.  She is here to review the results of her PET scan from 12/01/16.  BP (!) 156/60 (BP Location: Right Arm, Patient Position: Sitting)   Pulse (!) 58   Temp 98.2 F (36.8 C) (Oral)   Ht '5\' 1"'$  (1.549 m)   Wt 153 lb 6.4 oz (69.6 kg)   SpO2 100%   BMI 28.98 kg/m    Wt Readings from Last 3 Encounters:  12/06/16 153 lb 6.4 oz (69.6 kg)  11/16/16 151 lb 8 oz (68.7 kg)  11/08/16 145 lb (65.8 kg)

## 2016-12-08 ENCOUNTER — Ambulatory Visit
Admission: RE | Admit: 2016-12-08 | Discharge: 2016-12-08 | Disposition: A | Discharge status: Home / Self Care | Payer: Medicare Other | Source: Ambulatory Visit | Attending: Radiation Oncology | Admitting: Radiation Oncology

## 2016-12-08 DIAGNOSIS — C3491 Malignant neoplasm of unspecified part of right bronchus or lung: Secondary | ICD-10-CM

## 2016-12-08 DIAGNOSIS — C3411 Malignant neoplasm of upper lobe, right bronchus or lung: Secondary | ICD-10-CM | POA: Diagnosis not present

## 2016-12-08 DIAGNOSIS — Z510 Encounter for antineoplastic radiation therapy: Secondary | ICD-10-CM | POA: Diagnosis not present

## 2016-12-08 DIAGNOSIS — C3490 Malignant neoplasm of unspecified part of unspecified bronchus or lung: Secondary | ICD-10-CM | POA: Diagnosis not present

## 2016-12-08 NOTE — Progress Notes (Signed)
  Radiation Oncology         [phone] ________________________________  Name: Erin Carr MRN: [id number]  Date: 12/08/2016  DOB: 1930-10-18   STEREOTACTIC BODY RADIOTHERAPY SIMULATION AND TREATMENT PLANNING NOTE    DIAGNOSIS:  Stage 1 non-small cell lung cancer (adenocarcinoma)  NARRATIVE:  The patient was brought to the [facility].  Identity was confirmed.  All relevant records and images related to the planned course of therapy were reviewed.  The patient freely provided informed written consent to proceed with treatment after reviewing the details related to the planned course of therapy. The consent form was witnessed and verified by the simulation staff.  Then, the patient was set-up in a stable reproducible  supine position for radiation therapy.  A BodyFix immobilization pillow was fabricated for reproducible positioning.  Then I personally applied the abdominal compression paddle to limit respiratory excursion.  4D respiratoy motion management CT images were obtained.  Surface markings were placed.  The CT images were loaded into the planning software.  Then, using Cine, MIP, and standard views, the internal target volume (ITV) and planning target volumes (PTV) were delinieated, and avoidance structures were contoured.  Treatment planning then occurred.  The radiation prescription was entered and confirmed.  A total of two complex treatment devices were fabricated in the form of the BodyFix immobilization pillow and a neck accuform cushion.  I have requested : 3D Simulation  I have requested a DVH of the following structures: Heart, Lungs, Esophagus, Chest Wall, Brachial Plexus, Major Blood Vessels, and targets.  PLAN:  The patient will receive 54 Gy in 3 fraction.  -----------------------------------  Blair Promise, PhD, MD  This document serves as a record of services personally performed by Gery Pray, MD. It was created on his behalf by Bethann Humble, a  trained medical scribe. The creation of this record is based on the scribe's personal observations and the provider's statements to them. This document has been checked and approved by the attending provider.

## 2016-12-11 ENCOUNTER — Encounter (HOSPITAL_COMMUNITY): Payer: Self-pay

## 2016-12-11 DIAGNOSIS — C3490 Malignant neoplasm of unspecified part of unspecified bronchus or lung: Secondary | ICD-10-CM | POA: Diagnosis not present

## 2016-12-11 DIAGNOSIS — C3411 Malignant neoplasm of upper lobe, right bronchus or lung: Secondary | ICD-10-CM | POA: Diagnosis not present

## 2016-12-11 DIAGNOSIS — Z510 Encounter for antineoplastic radiation therapy: Secondary | ICD-10-CM | POA: Diagnosis not present

## 2016-12-19 ENCOUNTER — Ambulatory Visit
Admission: RE | Admit: 2016-12-19 | Discharge: 2016-12-19 | Disposition: A | Discharge status: Home / Self Care | Payer: Medicare Other | Source: Ambulatory Visit | Attending: Radiation Oncology | Admitting: Radiation Oncology

## 2016-12-19 ENCOUNTER — Encounter: Payer: Self-pay | Admitting: Radiation Oncology

## 2016-12-19 VITALS — BP 130/55 | HR 61 | Temp 98.3°F | Ht 61.0 in | Wt 153.8 lb

## 2016-12-19 DIAGNOSIS — C3490 Malignant neoplasm of unspecified part of unspecified bronchus or lung: Secondary | ICD-10-CM | POA: Diagnosis not present

## 2016-12-19 DIAGNOSIS — C3491 Malignant neoplasm of unspecified part of right bronchus or lung: Secondary | ICD-10-CM

## 2016-12-19 DIAGNOSIS — Z510 Encounter for antineoplastic radiation therapy: Secondary | ICD-10-CM | POA: Diagnosis not present

## 2016-12-19 NOTE — Progress Notes (Signed)
Erin Carr has completed 1 fraction to her right upper lung.  She denies having pain and shortness of breath.  She reports having a dry cough.  BP (!) 130/55 (BP Location: Left Arm, Patient Position: Sitting)   Pulse 61   Temp 98.3 F (36.8 C) (Oral)   Ht '5\' 1"'$  (1.549 m)   Wt 153 lb 12.8 oz (69.8 kg)   SpO2 100%   BMI 29.06 kg/m    Wt Readings from Last 3 Encounters:  12/19/16 153 lb 12.8 oz (69.8 kg)  12/06/16 153 lb 6.4 oz (69.6 kg)  11/16/16 151 lb 8 oz (68.7 kg)

## 2016-12-19 NOTE — Progress Notes (Signed)
  Radiation Oncology         [phone] ________________________________  Name: Erin Carr MRN: [id number]  Date: 12/19/2016  DOB: 1930/12/04  Weekly Radiation Therapy Management    ICD-9-CM ICD-10-CM   1. Adenocarcinoma of right lung, stage 1 ([facility]) 162.9 C34.91      Current Dose: 18 Gy     Planned Dose:  54 Gy  Narrative . . . . . . . . The patient presents for routine under treatment assessment.                                    The patient completed 1 fraction to her right upper lung. She denies pain or SOB. She reports a dry cough.                                  Set-up films were reviewed.                                 The chart was checked. Physical Findings. . .  height is '5\' 1"'$  (1.549 m) and weight is 153 lb 12.8 oz (69.8 kg). Her oral temperature is 98.3 F (36.8 C). Her blood pressure is 130/55 (abnormal) and her pulse is 61. Her oxygen saturation is 100%. . Weight essentially stable. Ambulatory with a cane. Lungs are clear to auscultation bilaterally. Heart has regular rate and rhythm. Impression . . . . . . . The patient is tolerating radiation. Plan . . . . . . . . . . . . Continue treatment as planned. ________________________________   Blair Promise, PhD, MD  This document serves as a record of services personally performed by Gery Pray, MD. It was created on his behalf by Darcus [name], a trained medical scribe. The creation of this record is based on the scribe's personal observations and the provider's statements to them. This document has been checked and approved by the attending provider.

## 2016-12-19 NOTE — Progress Notes (Signed)
  Radiation Oncology         [phone] ________________________________  Name: BERRY GALLACHER MRN: [id number]  Date: 12/19/2016  DOB: [birth year]  Stereotactic Body Radiotherapy Treatment Procedure Note  NARRATIVE:  Erin Carr was brought to the stereotactic radiation treatment machine and placed supine on the CT couch. The patient was set up for stereotactic body radiotherapy on the body fix pillow.  3D TREATMENT PLANNING AND DOSIMETRY:  The patient's radiation plan was reviewed and approved prior to starting treatment.  It showed 3-dimensional radiation distributions overlaid onto the planning CT.  The [facility] for the target structures as well as the organs at risk were reviewed. The documentation of this is filed in the radiation oncology EMR.  SIMULATION VERIFICATION:  The patient underwent CT imaging on the treatment unit.  These were carefully aligned to document that the ablative radiation dose would cover the target volume and maximally spare the nearby organs at risk according to the planned distribution.  SPECIAL TREATMENT PROCEDURE: Corliss Parish Schaner received high dose ablative stereotactic body radiotherapy to the planned target volume without unforeseen complications. Treatment was delivered uneventfully. The high doses associated with stereotactic body radiotherapy and the significant potential risks require careful treatment set up and patient monitoring constituting a special treatment procedure   STEREOTACTIC TREATMENT MANAGEMENT:  Following delivery, the patient was evaluated clinically. The patient tolerated treatment without significant acute effects, and was discharged to home in stable condition.    PLAN: Continue treatment as planned.  ________________________________  Blair Promise, PhD, MD   This document serves as a record of services personally performed by Gery Pray, MD. It was created on his behalf by Darcus [name], a trained medical scribe. The creation  of this record is based on the scribe's personal observations and the provider's statements to them. This document has been checked and approved by the attending provider.

## 2016-12-21 ENCOUNTER — Ambulatory Visit: Payer: Medicare Other | Admitting: Radiation Oncology

## 2016-12-22 ENCOUNTER — Ambulatory Visit: Payer: Medicare Other | Admitting: Radiation Oncology

## 2016-12-25 ENCOUNTER — Ambulatory Visit: Payer: Medicare Other | Admitting: Radiation Oncology

## 2016-12-25 ENCOUNTER — Ambulatory Visit
Admission: RE | Admit: 2016-12-25 | Discharge: 2016-12-25 | Disposition: A | Discharge status: Home / Self Care | Payer: Medicare Other | Source: Ambulatory Visit | Attending: Radiation Oncology | Admitting: Radiation Oncology

## 2016-12-25 DIAGNOSIS — Z510 Encounter for antineoplastic radiation therapy: Secondary | ICD-10-CM | POA: Diagnosis not present

## 2016-12-25 DIAGNOSIS — C3490 Malignant neoplasm of unspecified part of unspecified bronchus or lung: Secondary | ICD-10-CM | POA: Diagnosis not present

## 2016-12-27 ENCOUNTER — Ambulatory Visit
Admission: RE | Admit: 2016-12-27 | Discharge: 2016-12-27 | Disposition: A | Discharge status: Home / Self Care | Payer: Medicare Other | Source: Ambulatory Visit | Attending: Radiation Oncology | Admitting: Radiation Oncology

## 2016-12-27 DIAGNOSIS — Z510 Encounter for antineoplastic radiation therapy: Secondary | ICD-10-CM | POA: Diagnosis not present

## 2016-12-27 DIAGNOSIS — C3411 Malignant neoplasm of upper lobe, right bronchus or lung: Secondary | ICD-10-CM | POA: Diagnosis not present

## 2016-12-27 DIAGNOSIS — C3491 Malignant neoplasm of unspecified part of right bronchus or lung: Secondary | ICD-10-CM

## 2016-12-27 DIAGNOSIS — C3490 Malignant neoplasm of unspecified part of unspecified bronchus or lung: Secondary | ICD-10-CM | POA: Diagnosis not present

## 2016-12-27 NOTE — Progress Notes (Signed)
  Radiation Oncology         [phone] ________________________________  Name: Erin Carr MRN: [id number]  Date: 12/27/2016  DOB: 1930-04-28  Stereotactic Body Radiotherapy Treatment Procedure Note  NARRATIVE:  Erin Carr was brought to the stereotactic radiation treatment machine and placed supine on the CT couch. The patient was set up for stereotactic body radiotherapy on the body fix pillow.  3D TREATMENT PLANNING AND DOSIMETRY:  The patient's radiation plan was reviewed and approved prior to starting treatment.  It showed 3-dimensional radiation distributions overlaid onto the planning CT.  The [facility] for the target structures as well as the organs at risk were reviewed. The documentation of this is filed in the radiation oncology EMR.  SIMULATION VERIFICATION:  The patient underwent CT imaging on the treatment unit.  These were carefully aligned to document that the ablative radiation dose would cover the target volume and maximally spare the nearby organs at risk according to the planned distribution.  SPECIAL TREATMENT PROCEDURE: Erin Carr Police received high dose ablative stereotactic body radiotherapy to the planned target volume without unforeseen complications. Treatment was delivered uneventfully. The high doses associated with stereotactic body radiotherapy and the significant potential risks require careful treatment set up and patient monitoring constituting a special treatment procedure   STEREOTACTIC TREATMENT MANAGEMENT:  Following delivery, the patient was evaluated clinically. The patient tolerated treatment without significant acute effects, and was discharged to home in stable condition.    PLAN: Continue treatment as planned.  ________________________________  Blair Promise, PhD, MD   This document serves as a record of services personally performed by Gery Pray, MD. It was created on his behalf by Bethann Humble, a trained medical scribe. The creation  of this record is based on the scribe's personal observations and the provider's statements to them. This document has been checked and approved by the attending provider.

## 2016-12-28 ENCOUNTER — Telehealth: Payer: Self-pay | Admitting: *Deleted

## 2016-12-28 NOTE — Telephone Encounter (Signed)
Oncology Nurse Navigator Documentation  Oncology Nurse Navigator Flowsheets 12/28/2016  Navigator Location [facility]-  Navigator Encounter Type Telephone/I called to follow up with Erin Carr after her final XRT TX.  She states she is feeling well. I listened as she explained. She states she has done well with the radiation.  She has done very well.  I encourage her to call me if needed.   Telephone Outgoing Call  Treatment Phase Final Radiation Tx  Barriers/Navigation Needs Education  Education Other  Interventions Education  Education Method Verbal  Acuity Level 1  Acuity Level 1 Minimal follow up required  Time Spent with Patient 15

## 2016-12-29 ENCOUNTER — Ambulatory Visit: Payer: Medicare Other | Admitting: Radiation Oncology

## 2017-01-01 ENCOUNTER — Encounter: Payer: Self-pay | Admitting: Radiation Oncology

## 2017-01-01 NOTE — Progress Notes (Signed)
  Radiation Oncology         [phone] ________________________________  Name: Erin Carr MRN: [id number]  Date: 01/01/2017  DOB: [birth year]  End of Treatment Note  Diagnosis:   Adenocarcinoma of the right lung, stage 1    Indication for treatment:  Curative      Radiation treatment dates:  12/19/16-12/27/16  Site/dose:  Right upper lobe/ 54 Gy in 3 fractions  Beams/energy:   SBRT SRT-3D/ 6FFF  Narrative: The patient tolerated radiation treatment relatively well. The patient reported a dry cough during treatment. She denied pain or shortness of breath.  Plan: The patient has completed radiation treatment. The patient will return to radiation oncology clinic for routine followup in one month. I advised them to call or return sooner if they have any questions or concerns related to their recovery or treatment.  -----------------------------------  Blair Promise, PhD, MD  This document serves as a record of services personally performed by Gery Pray, MD. It was created on his behalf by Bethann Humble, a trained medical scribe. The creation of this record is based on the scribe's personal observations and the provider's statements to them. This document has been checked and approved by the attending provider.

## 2017-01-08 IMAGING — CT CT CHEST W/ CM
2 of 4 series · 13 of 36 positions shown, 16 images · IV contrast (Iodine)
Comparison: CT scan of February 14, 2010.

CLINICAL DATA: Right pulmonary nodule.

EXAM:
CT CHEST WITH CONTRAST
TECHNIQUE: Multidetector CT imaging of the chest was performed during
intravenous contrast administration.
CONTRAST:  75mL OMNIPAQUE IOHEXOL 300 MG/ML  SOLN

[Series 201: chest with, idose (2) · axial · 0.62mm/px · z∈[-957,-672]mm · 10 of 67 slices shown, 13 images]
[im 5/67  mediastinal]
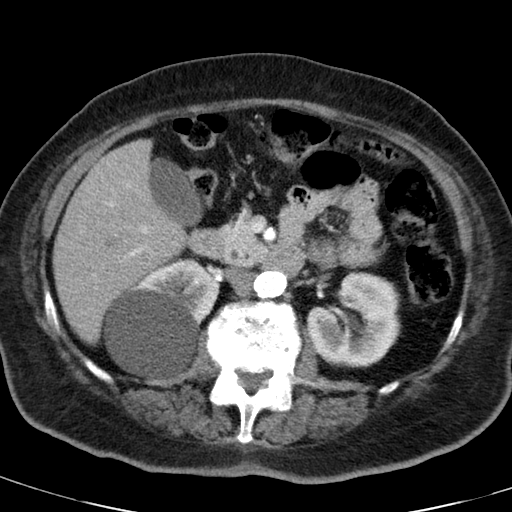
[im 5/67  lung]
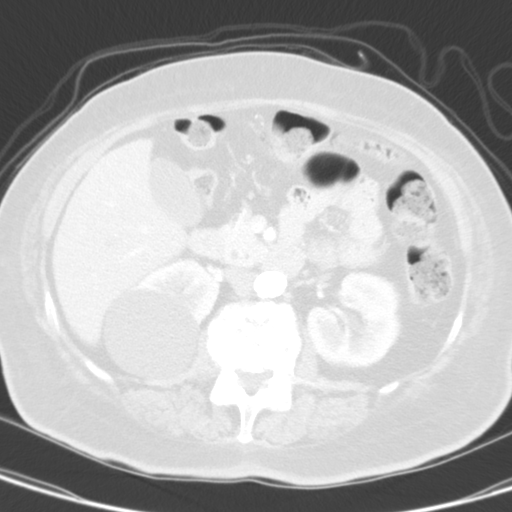
[im 10/67  lung]
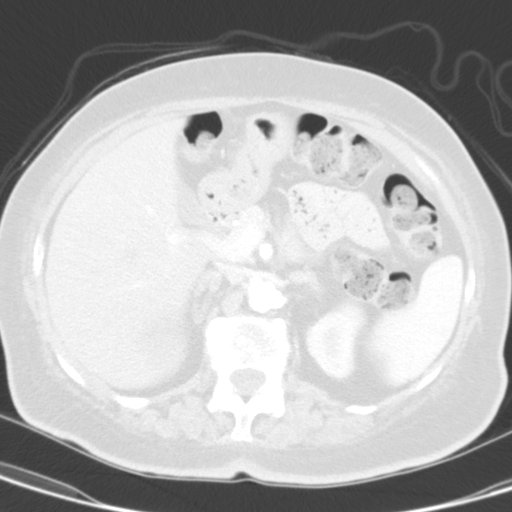
[im 19/67  lung]
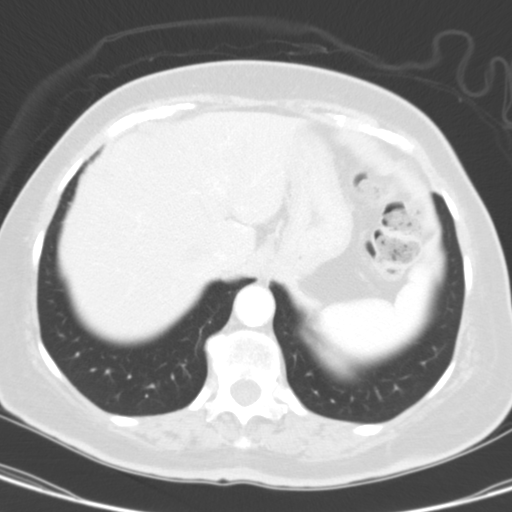
[im 24/67  lung]
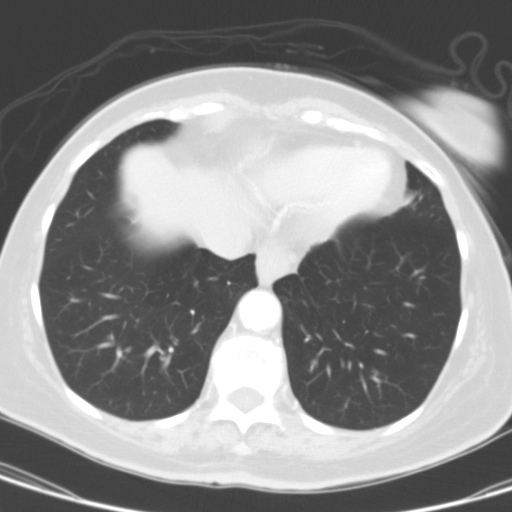
[im 29/67  mediastinal]
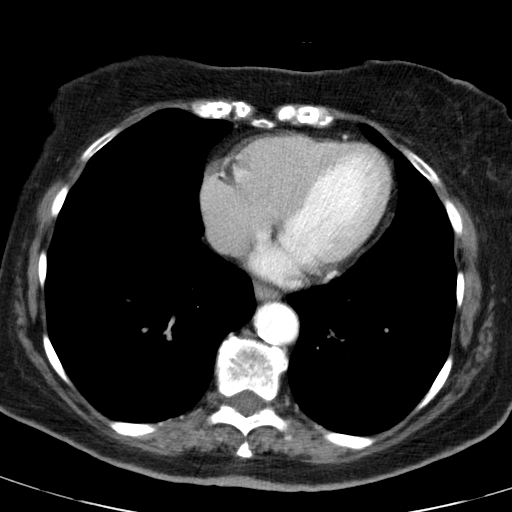
[im 29/67  lung]
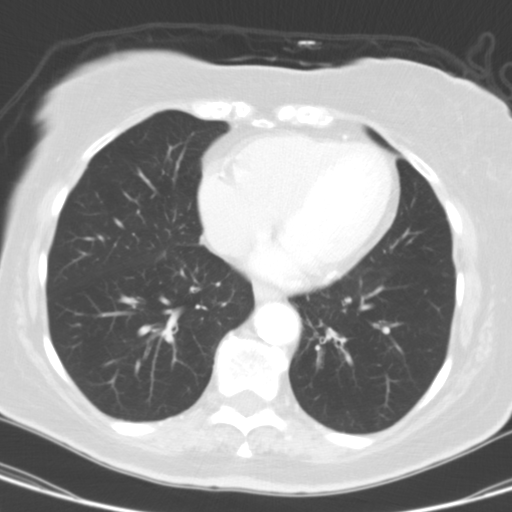
[im 38/67  lung]
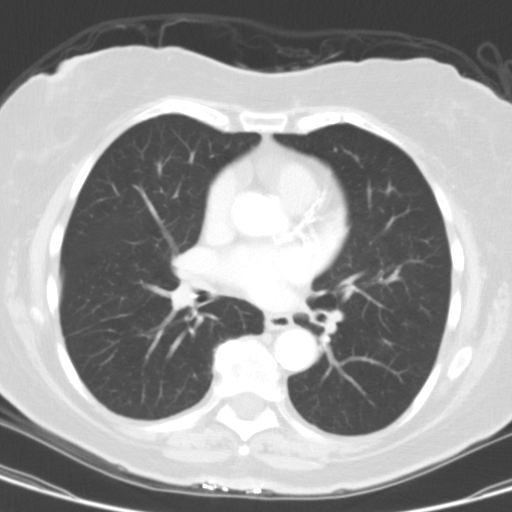
[im 43/67  lung]
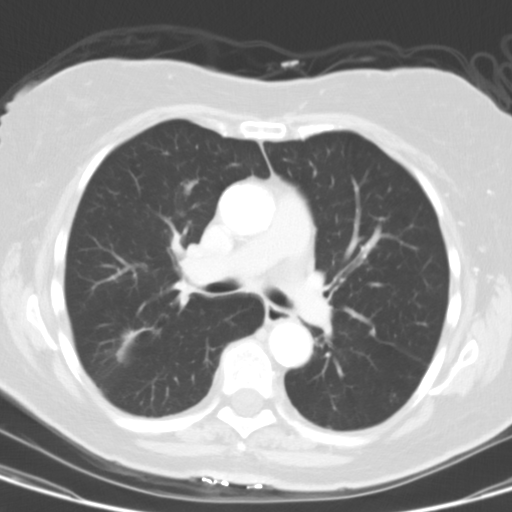
[im 48/67  lung]
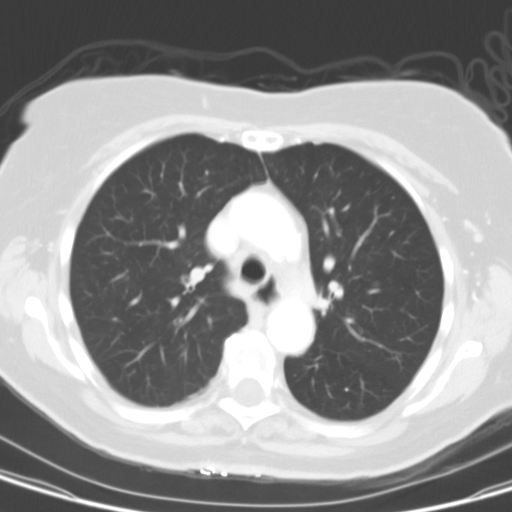
[im 57/67  mediastinal]
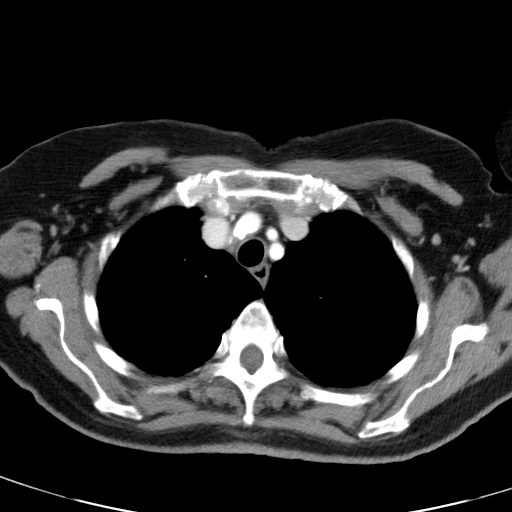
[im 57/67  lung]
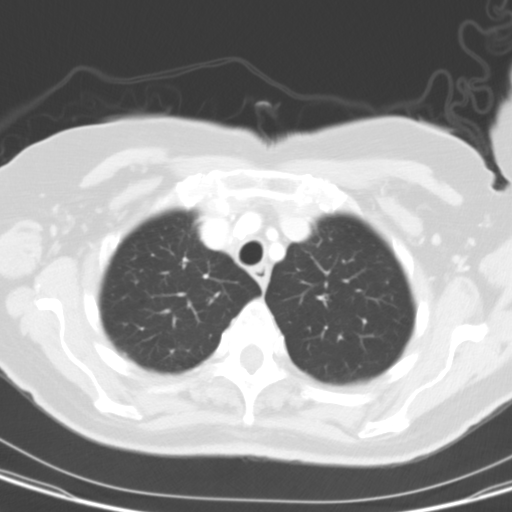
[im 62/67  lung]
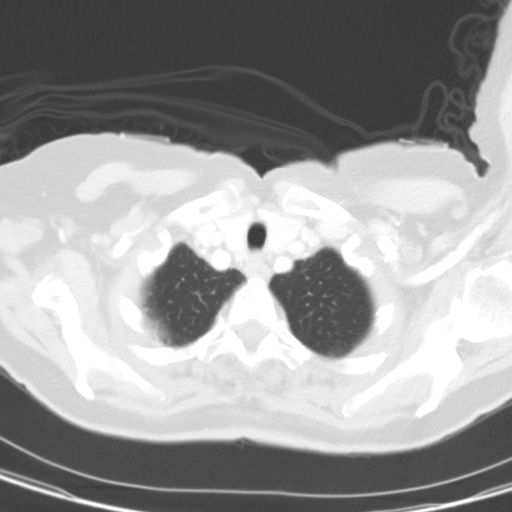

[Series 203: coronal, idose (2) · coronal · 0.45mm/px · 3 of 125 slices shown]
[im 25/125  lung]
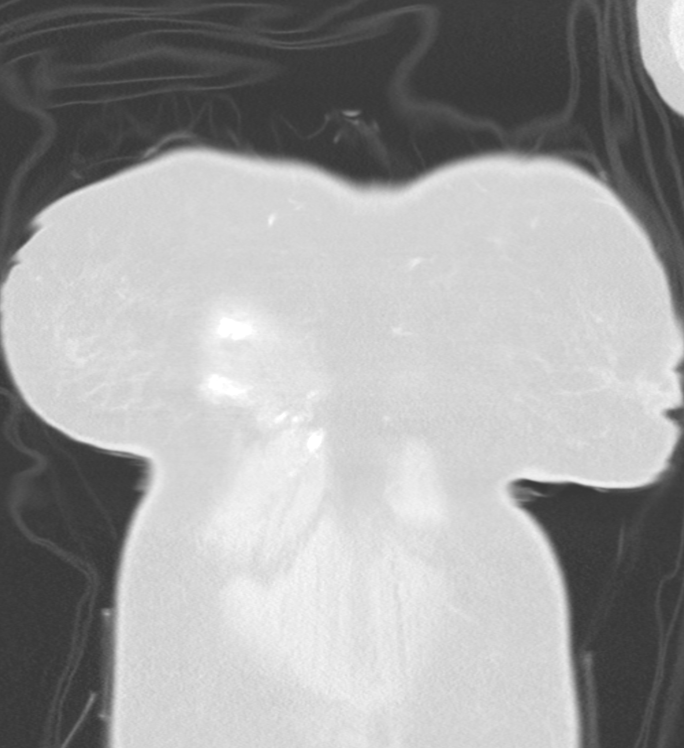
[im 50/125  lung]
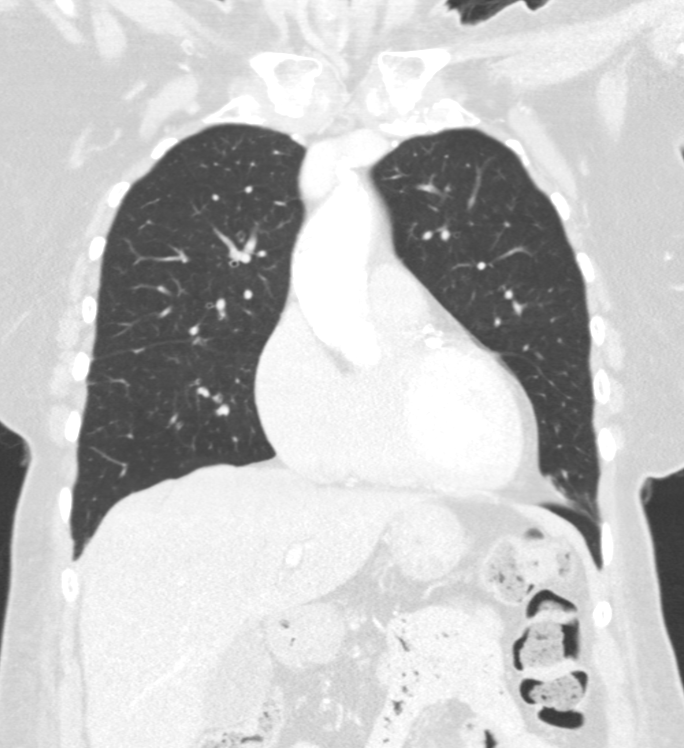
[im 75/125  lung]
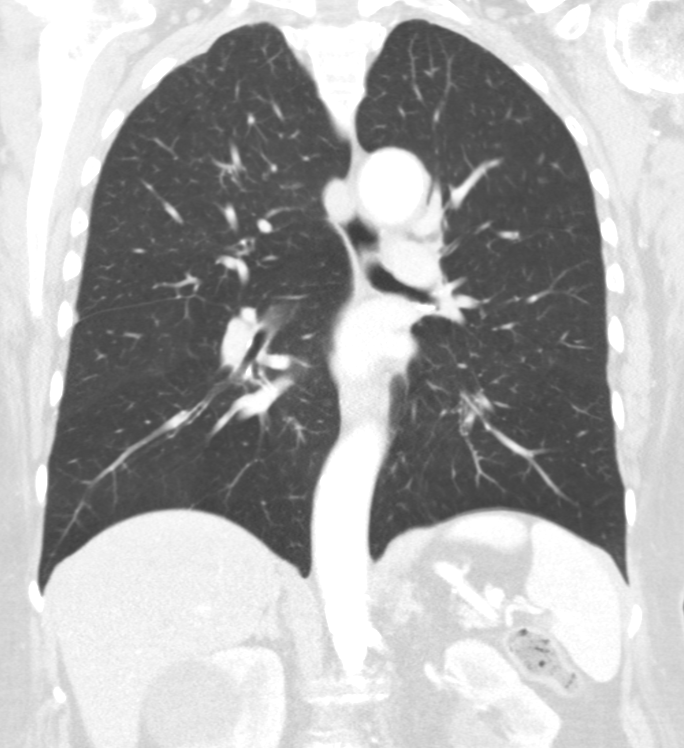

[13 of 36 positions shown; findings below may reference images not displayed]

FINDINGS: Multilevel degenerative disc disease is noted in the thoracic spine.
No pneumothorax or pleural effusion is noted. 4.4 mm nodule is noted
medially in the posterior portion of the left lower lobe best seen
on image number 40 of series 205. This appears to be slightly
enlarged compared to prior exam. 27 x 19 mm spiculated density is
noted posteriorly in the right upper lobe best seen on image number
23 of series [DATE]. This is significantly increased in size
compared to prior exam and is concerning for malignancy.
Atherosclerosis of thoracic aorta is noted without aneurysm or
dissection. Coronary artery calcifications are noted. Visualized
portion of upper abdomen is unremarkable. No significant mediastinal
adenopathy is noted. Small bilateral thyroid nodules are noted.
IMPRESSION: Coronary artery calcifications are noted suggesting coronary artery
disease.

Atherosclerosis of thoracic aorta is noted without aneurysm or
dissection.

27 x 19 mm spiculated density is noted posteriorly in right upper
lobe which is significantly increased in size compared to prior
exam. This is highly suspicious for malignancy, and further
evaluation with PET scan is recommended.

4 mm nodule is noted medially in the posterior portion of the left
lower lobe which is increased in size compared to prior exam.
Continued CT follow-up is recommended.

These results will be called to the ordering clinician or
representative by the Radiologist Assistant, and communication
documented in the PACS or zVision Dashboard.

## 2017-02-07 ENCOUNTER — Encounter: Payer: Self-pay | Admitting: Oncology

## 2017-02-08 ENCOUNTER — Ambulatory Visit
Admission: RE | Admit: 2017-02-08 | Discharge: 2017-02-08 | Disposition: A | Discharge status: Home / Self Care | Payer: Medicare Other | Source: Ambulatory Visit | Attending: Radiation Oncology | Admitting: Radiation Oncology

## 2017-02-08 ENCOUNTER — Encounter: Payer: Self-pay | Admitting: Radiation Oncology

## 2017-02-08 DIAGNOSIS — Z79899 Other long term (current) drug therapy: Secondary | ICD-10-CM | POA: Diagnosis not present

## 2017-02-08 DIAGNOSIS — Z7984 Long term (current) use of oral hypoglycemic drugs: Secondary | ICD-10-CM | POA: Diagnosis not present

## 2017-02-08 DIAGNOSIS — Z7982 Long term (current) use of aspirin: Secondary | ICD-10-CM | POA: Diagnosis not present

## 2017-02-08 DIAGNOSIS — C3491 Malignant neoplasm of unspecified part of right bronchus or lung: Secondary | ICD-10-CM | POA: Diagnosis not present

## 2017-02-08 NOTE — Progress Notes (Signed)
Radiation Oncology         [phone] ________________________________  Name: Erin Carr MRN: [id number]  Date: 02/08/2017  DOB: [birth year]  Follow-Up Visit Note  CC: Leola Brazil, MD  Ivin Poot, MD    ICD-9-CM ICD-10-CM   1. Adenocarcinoma of right lung, stage 1 ([facility]CC) 162.9 C34.91     Diagnosis:  Adenocarcinoma of the right lung, stage 1   Interval Since Last Radiation:  6 weeks 12/19/16-12/27/16 54 Gy in 3 fractions to the right upper lobe  Narrative:  The patient returns today for routine follow-up. She denies pain at this time. She notes occasional shortness of breath with activity. She reports an occasional productive cough with white sputum. She denies hemoptysis. She reports having a good energy level and appetite.                              ALLERGIES:  has No Known Allergies.  Meds: Current Outpatient Prescriptions  Medication Sig Dispense Refill  . albuterol (PROVENTIL HFA;VENTOLIN HFA) 108 (90 Base) MCG/ACT inhaler Inhale 1 puff into the lungs every 4 (four) hours as needed for wheezing or shortness of breath.    Marland Kitchen amLODipine (NORVASC) 10 MG tablet Take 10 mg by mouth daily.    Marland Kitchen aspirin EC 81 MG tablet Take 81 mg by mouth daily.    . ergocalciferol (VITAMIN D2) 50000 units capsule Take 50,000 Units by mouth once a week.    . furosemide (LASIX) 40 MG tablet Take 40 mg by mouth 2 (two) times daily.    Marland Kitchen gabapentin (NEURONTIN) 400 MG capsule Take 400 mg by mouth 2 (two) times daily.    Marland Kitchen glipiZIDE (GLUCOTROL) 5 MG tablet Take 5 mg by mouth daily before breakfast.    . labetalol (NORMODYNE) 100 MG tablet Take 100 mg by mouth 2 (two) times daily.    Marland Kitchen lisinopril (PRINIVIL,ZESTRIL) 20 MG tablet Take 20 mg by mouth 2 (two) times daily.    . metFORMIN (GLUCOPHAGE) 500 MG tablet Take 500 mg by mouth 2 (two) times daily with a meal.    . potassium chloride (K-DUR) 10 MEQ tablet Take 10 mEq by mouth daily.    . propranolol (INDERAL) 10 MG tablet Take  10 mg by mouth every 12 (twelve) hours.    . simvastatin (ZOCOR) 20 MG tablet Take 20 mg by mouth at bedtime.     . travoprost, benzalkonium, (TRAVATAN) 0.004 % ophthalmic solution Place 1 drop into both eyes at bedtime.     No current facility-administered medications for this encounter.     Physical Findings: The patient is in no acute distress. Patient is alert and oriented.  height is '5\' 1"'$  (1.549 m) and weight is 150 lb 3.2 oz (68.1 kg). Her oral temperature is 98.7 F (37.1 C). Her blood pressure is 107/50 (abnormal) and her pulse is 64. Her oxygen saturation is 100%. .  No significant changes. Lungs are clear to auscultation bilaterally. Heart has regular rate and rhythm. No palpable cervical, supraclavicular, or axillary adenopathy. Abdomen soft, non-tender, normal bowel sounds.   Lab Findings: Lab Results  Component Value Date   WBC 7.0 10/31/2016   HGB 11.1 (L) 10/31/2016   HCT 35.7 (L) 10/31/2016   MCV 82.6 10/31/2016   PLT 203 10/31/2016    Radiographic Findings: No results found.  Impression:  Adenocarcinoma of the right lung, stage 1. The patient is recovering from the effects  of radiation. No residual side effects this time  Plan:  Schedule for a CT scan in a few months with a follow-up. Patient will follow up with Dr. Julien Nordmann on 05/23/17 for blood work and exam.   ____________________________________   This document serves as a record of services personally performed by Gery Pray, MD. It was created on his behalf by Bethann Humble, a trained medical scribe. The creation of this record is based on the scribe's personal observations and the provider's statements to them. This document has been checked and approved by the attending provider.

## 2017-02-08 NOTE — Progress Notes (Signed)
Erin Carr is here for follow up after treatment to her right upper lobe.  She denies having pain.  She reports occasional shortness of breath with activity.  She reports an occasional productive cough with white sputum.  She denies having hemoptysis.  She reports having a good energy level and appetite.  BP (!) 107/50 (BP Location: Right Arm, Patient Position: Sitting)   Pulse 64   Temp 98.7 F (37.1 C) (Oral)   Ht '5\' 1"'$  (1.549 m)   Wt 150 lb 3.2 oz (68.1 kg)   SpO2 100%   BMI 28.38 kg/m    Wt Readings from Last 3 Encounters:  02/08/17 150 lb 3.2 oz (68.1 kg)  12/19/16 153 lb 12.8 oz (69.8 kg)  12/06/16 153 lb 6.4 oz (69.6 kg)

## 2017-03-13 DIAGNOSIS — M1990 Unspecified osteoarthritis, unspecified site: Secondary | ICD-10-CM | POA: Diagnosis not present

## 2017-03-13 DIAGNOSIS — E7800 Pure hypercholesterolemia, unspecified: Secondary | ICD-10-CM | POA: Diagnosis not present

## 2017-03-13 DIAGNOSIS — J441 Chronic obstructive pulmonary disease with (acute) exacerbation: Secondary | ICD-10-CM | POA: Diagnosis not present

## 2017-03-13 DIAGNOSIS — E113299 Type 2 diabetes mellitus with mild nonproliferative diabetic retinopathy without macular edema, unspecified eye: Secondary | ICD-10-CM | POA: Diagnosis not present

## 2017-03-13 DIAGNOSIS — E559 Vitamin D deficiency, unspecified: Secondary | ICD-10-CM | POA: Diagnosis not present

## 2017-03-13 DIAGNOSIS — E119 Type 2 diabetes mellitus without complications: Secondary | ICD-10-CM | POA: Diagnosis not present

## 2017-03-13 DIAGNOSIS — J4521 Mild intermittent asthma with (acute) exacerbation: Secondary | ICD-10-CM | POA: Diagnosis not present

## 2017-03-13 DIAGNOSIS — E1165 Type 2 diabetes mellitus with hyperglycemia: Secondary | ICD-10-CM | POA: Diagnosis not present

## 2017-03-13 DIAGNOSIS — C3491 Malignant neoplasm of unspecified part of right bronchus or lung: Secondary | ICD-10-CM | POA: Diagnosis not present

## 2017-03-13 DIAGNOSIS — I119 Hypertensive heart disease without heart failure: Secondary | ICD-10-CM | POA: Diagnosis not present

## 2017-03-13 DIAGNOSIS — R251 Tremor, unspecified: Secondary | ICD-10-CM | POA: Diagnosis not present

## 2017-03-13 DIAGNOSIS — Z79899 Other long term (current) drug therapy: Secondary | ICD-10-CM | POA: Diagnosis not present

## 2017-03-20 DIAGNOSIS — H40013 Open angle with borderline findings, low risk, bilateral: Secondary | ICD-10-CM | POA: Diagnosis not present

## 2017-03-20 DIAGNOSIS — H25813 Combined forms of age-related cataract, bilateral: Secondary | ICD-10-CM | POA: Diagnosis not present

## 2017-03-20 DIAGNOSIS — H401133 Primary open-angle glaucoma, bilateral, severe stage: Secondary | ICD-10-CM | POA: Diagnosis not present

## 2017-03-20 DIAGNOSIS — H2513 Age-related nuclear cataract, bilateral: Secondary | ICD-10-CM | POA: Diagnosis not present

## 2017-05-16 ENCOUNTER — Other Ambulatory Visit (HOSPITAL_BASED_OUTPATIENT_CLINIC_OR_DEPARTMENT_OTHER): Payer: Medicare Other

## 2017-05-16 ENCOUNTER — Encounter (HOSPITAL_COMMUNITY): Payer: Self-pay

## 2017-05-16 ENCOUNTER — Ambulatory Visit (HOSPITAL_COMMUNITY)
Admission: RE | Admit: 2017-05-16 | Discharge: 2017-05-16 | Disposition: A | Discharge status: Home / Self Care | Payer: Medicare Other | Source: Ambulatory Visit | Attending: Internal Medicine | Admitting: Internal Medicine

## 2017-05-16 DIAGNOSIS — C3490 Malignant neoplasm of unspecified part of unspecified bronchus or lung: Secondary | ICD-10-CM | POA: Diagnosis not present

## 2017-05-16 DIAGNOSIS — I700 Atherosclerosis of aorta: Secondary | ICD-10-CM | POA: Diagnosis not present

## 2017-05-16 DIAGNOSIS — C3491 Malignant neoplasm of unspecified part of right bronchus or lung: Secondary | ICD-10-CM

## 2017-05-16 DIAGNOSIS — I2510 Atherosclerotic heart disease of native coronary artery without angina pectoris: Secondary | ICD-10-CM | POA: Diagnosis not present

## 2017-05-16 DIAGNOSIS — Z9889 Other specified postprocedural states: Secondary | ICD-10-CM | POA: Insufficient documentation

## 2017-05-16 LAB — CBC WITH DIFFERENTIAL/PLATELET
BASO%: 0.4 % (ref 0.0–2.0)
Basophils Absolute: 0 10*3/uL (ref 0.0–0.1)
EOS%: 10.9 % — ABNORMAL HIGH (ref 0.0–7.0)
Eosinophils Absolute: 0.9 10*3/uL — ABNORMAL HIGH (ref 0.0–0.5)
HEMATOCRIT: 37.7 % (ref 34.8–46.6)
HGB: 11.6 g/dL (ref 11.6–15.9)
LYMPH#: 1.2 10*3/uL (ref 0.9–3.3)
LYMPH%: 14.8 % (ref 14.0–49.7)
MCH: 26.3 pg (ref 25.1–34.0)
MCHC: 30.8 g/dL — AB (ref 31.5–36.0)
MCV: 85.5 fL (ref 79.5–101.0)
MONO#: 0.9 10*3/uL (ref 0.1–0.9)
MONO%: 10.9 % (ref 0.0–14.0)
NEUT#: 5.2 10*3/uL (ref 1.5–6.5)
NEUT%: 63 % (ref 38.4–76.8)
Platelets: 189 10*3/uL (ref 145–400)
RBC: 4.41 10*6/uL (ref 3.70–5.45)
RDW: 13.7 % (ref 11.2–14.5)
WBC: 8.3 10*3/uL (ref 3.9–10.3)

## 2017-05-16 LAB — COMPREHENSIVE METABOLIC PANEL
ALT: 8 U/L (ref 0–55)
AST: 14 U/L (ref 5–34)
Albumin: 4 g/dL (ref 3.5–5.0)
Alkaline Phosphatase: 85 U/L (ref 40–150)
Anion Gap: 7 mEq/L (ref 3–11)
BUN: 16.1 mg/dL (ref 7.0–26.0)
CHLORIDE: 104 meq/L (ref 98–109)
CO2: 29 meq/L (ref 22–29)
CREATININE: 0.9 mg/dL (ref 0.6–1.1)
Calcium: 10.4 mg/dL (ref 8.4–10.4)
EGFR: 68 mL/min/{1.73_m2} — ABNORMAL LOW (ref >90)
Glucose: 111 mg/dl (ref 70–140)
POTASSIUM: 4.1 meq/L (ref 3.5–5.1)
SODIUM: 141 meq/L (ref 136–145)
Total Bilirubin: 0.4 mg/dL (ref 0.20–1.20)
Total Protein: 7.4 g/dL (ref 6.4–8.3)

## 2017-05-23 ENCOUNTER — Encounter: Payer: Self-pay | Admitting: Radiation Oncology

## 2017-05-23 ENCOUNTER — Encounter: Payer: Self-pay | Admitting: Internal Medicine

## 2017-05-23 ENCOUNTER — Telehealth: Payer: Self-pay | Admitting: Internal Medicine

## 2017-05-23 ENCOUNTER — Ambulatory Visit (HOSPITAL_BASED_OUTPATIENT_CLINIC_OR_DEPARTMENT_OTHER): Payer: Medicare Other | Admitting: Internal Medicine

## 2017-05-23 VITALS — BP 154/63 | HR 67 | Temp 98.5°F | Resp 18 | Ht 61.0 in | Wt 143.4 lb

## 2017-05-23 DIAGNOSIS — C3411 Malignant neoplasm of upper lobe, right bronchus or lung: Secondary | ICD-10-CM | POA: Diagnosis not present

## 2017-05-23 DIAGNOSIS — D381 Neoplasm of uncertain behavior of trachea, bronchus and lung: Secondary | ICD-10-CM

## 2017-05-23 DIAGNOSIS — R911 Solitary pulmonary nodule: Secondary | ICD-10-CM

## 2017-05-23 DIAGNOSIS — C3491 Malignant neoplasm of unspecified part of right bronchus or lung: Secondary | ICD-10-CM

## 2017-05-23 NOTE — Telephone Encounter (Signed)
Appointments scheduled per 05/23/17 los. Patient was given a copy of the AVS report and appointment schedule, per 05/23/17 los.

## 2017-05-23 NOTE — Progress Notes (Signed)
[facility] Telephone:(336) [phone]   Fax:(336) [phone]  OFFICE PROGRESS NOTE  Vincente Liberty, MD [place] [address]  DIAGNOSIS: Stage IA (T1b, N0, M0) non-small cell lung cancer, adenocarcinoma presented with right upper lobe lung nodule diagnosed in November 2017.  PRIOR THERAPY: Curative stereotactic body radiotherapy to the right upper lobe lung nodule under the care of Dr. Sondra Come completed on 12/27/2016.  CURRENT THERAPY: None.  INTERVAL HISTORY: Erin Carr 83 y.o. female returns to the clinic today for follow-up visit accompanied by her sister. The patient is feeling fine today with no specific complaints. She tolerated her previous curative radiotherapy fairly well. She denied having any chest pain, shortness of breath, cough or hemoptysis. She denied having any nausea, vomiting, diarrhea or constipation. She has no significant weight loss or night sweats. She had repeat CT scan of the chest performed recently and she is here for evaluation and discussion of her scan results.  MEDICAL HISTORY: Past Medical History:  Diagnosis Date  . Adenocarcinoma of right lung, stage 1 ([facility]) 11/16/2016  . Arthritis   . Asthma   . COPD (chronic obstructive pulmonary disease) ([facility])   . Diabetes mellitus without complication ([facility])    Type II  . Family history of adverse reaction to anesthesia    "aunt had PONV"  . History of radiation therapy 12/19/16-12/27/16   SBRT to right upper lobe 54 Gy in 3 fractions  . Hypercholesteremia   . Hypertension   . Osteoporosis   . Positive PPD 12/16   PER PCP  . Shortness of breath dyspnea   . Tremors of nervous system    Left hand  . Urinary frequency   . Vitamin D deficiency   . Wears glasses     ALLERGIES:  has No Known Allergies.  MEDICATIONS:  Current Outpatient Prescriptions  Medication Sig Dispense Refill  . albuterol (PROVENTIL HFA;VENTOLIN HFA) 108 (90 Base) MCG/ACT inhaler Inhale 1  puff into the lungs every 4 (four) hours as needed for wheezing or shortness of breath.    Marland Kitchen amLODipine (NORVASC) 10 MG tablet Take 10 mg by mouth daily.    Marland Kitchen aspirin EC 81 MG tablet Take 81 mg by mouth daily.    . ergocalciferol (VITAMIN D2) 50000 units capsule Take 50,000 Units by mouth once a week.    . estradiol (ESTRACE VAGINAL) 0.1 MG/GM vaginal cream Estrace 0.01% (0.1 mg/gram) vaginal cream  Place a thin layer  in vagina at bedtime for one week, then every other day for one week, then three times a day for 4 weeks.    . furosemide (LASIX) 40 MG tablet Take 40 mg by mouth 2 (two) times daily.    Marland Kitchen gabapentin (NEURONTIN) 400 MG capsule Take 400 mg by mouth 2 (two) times daily.    Marland Kitchen glipiZIDE (GLUCOTROL) 5 MG tablet Take 5 mg by mouth daily before breakfast.    . HYDROcodone-acetaminophen (NORCO/VICODIN) 5-325 MG tablet hydrocodone 5 mg-acetaminophen 325 mg tablet    . labetalol (NORMODYNE) 100 MG tablet Take 100 mg by mouth 2 (two) times daily.    Marland Kitchen lisinopril (PRINIVIL,ZESTRIL) 20 MG tablet Take 20 mg by mouth 2 (two) times daily.    . metFORMIN (GLUCOPHAGE) 500 MG tablet Take 500 mg by mouth 2 (two) times daily with a meal.    . OXYQUINOLONE SULFATE VAGINAL (TRIMO-SAN) 0.025 % GEL Trimo-[place] 0.025 %-0.01 % vaginal  Insert 1 g by vaginal route for  14 days.    . potassium chloride (K-DUR) 10 MEQ tablet Take 10 mEq by mouth daily.    . propranolol (INDERAL) 10 MG tablet Take 10 mg by mouth every 12 (twelve) hours.    . simvastatin (ZOCOR) 20 MG tablet Take 20 mg by mouth at bedtime.     . travoprost, benzalkonium, (TRAVATAN) 0.004 % ophthalmic solution Place 1 drop into both eyes at bedtime.    . cephALEXin (KEFLEX) 500 MG capsule cephalexin 500 mg capsule    . ciprofloxacin (CIPRO) 250 MG tablet ciprofloxacin 250 mg tablet    . ciprofloxacin (CIPRO) 500 MG tablet ciprofloxacin 500 mg tablet     No current facility-administered medications for this visit.     SURGICAL HISTORY:    Past Surgical History:  Procedure Laterality Date  . ABDOMINAL HYSTERECTOMY    . ANTERIOR AND POSTERIOR REPAIR N/A 12/16/2014   Procedure: ANTERIOR (CYSTOCELE) AND POSTERIOR REPAIR (RECTOCELE);  Surgeon: Crawford Givens, MD;  Location: [facility];  Service: Gynecology;  Laterality: N/A;  . APPENDECTOMY    . BOWEL RESECTION    . BREAST BIOPSY Left   . COLONOSCOPY    . VIDEO BRONCHOSCOPY WITH ENDOBRONCHIAL ULTRASOUND N/A 12/17/2015   Procedure: VIDEO BRONCHOSCOPY WITH ENDOBRONCHIAL ULTRASOUND;  Surgeon: Ivin Poot, MD;  Location: [facility];  Service: Thoracic;  Laterality: N/A;    REVIEW OF SYSTEMS:  A comprehensive review of systems was negative.   PHYSICAL EXAMINATION: General appearance: alert, cooperative, fatigued and no distress Head: Normocephalic, without obvious abnormality, atraumatic Neck: no adenopathy, no JVD, supple, symmetrical, trachea midline and thyroid not enlarged, symmetric, no tenderness/mass/nodules Lymph nodes: Cervical, supraclavicular, and axillary nodes normal. Resp: clear to auscultation bilaterally Back: symmetric, no curvature. ROM normal. No CVA tenderness. Cardio: regular rate and rhythm, S1, S2 normal, no murmur, click, rub or gallop GI: soft, non-tender; bowel sounds normal; no masses,  no organomegaly Extremities: extremities normal, atraumatic, no cyanosis or edema  ECOG PERFORMANCE STATUS: 1 - Symptomatic but completely ambulatory  Blood pressure (!) 154/63, pulse 67, temperature 98.5 F (36.9 C), temperature source Oral, resp. rate 18, height 5\' 1"  (1.549 m), weight 143 lb 6.4 oz (65 kg), SpO2 100 %.  LABORATORY DATA: Lab Results  Component Value Date   WBC 8.3 05/16/2017   HGB 11.6 05/16/2017   HCT 37.7 05/16/2017   MCV 85.5 05/16/2017   PLT 189 05/16/2017      Chemistry      Component Value Date/Time   NA 141 05/16/2017 1111   K 4.1 05/16/2017 1111   CL 104 12/09/2015 1224   CO2 29 05/16/2017 1111   BUN 16.1 05/16/2017 1111   CREATININE  0.9 05/16/2017 1111      Component Value Date/Time   CALCIUM 10.4 05/16/2017 1111   ALKPHOS 85 05/16/2017 1111   AST 14 05/16/2017 1111   ALT 8 05/16/2017 1111   BILITOT 0.40 05/16/2017 1111       RADIOGRAPHIC STUDIES: Ct Chest Wo Contrast  Result Date: 05/16/2017 CLINICAL DATA:  Right lung cancer, radiation therapy complete. EXAM: CT CHEST WITHOUT CONTRAST TECHNIQUE: Multidetector CT imaging of the chest was performed following the standard protocol without IV contrast. COMPARISON:  PET 12/01/2016 and CT chest 09/27/2016. FINDINGS: Cardiovascular: Atherosclerotic calcification of the arterial vasculature, including three-vessel involvement of the coronary arteries. Heart size normal. No pericardial effusion. Mediastinum/Nodes: No pathologically enlarged mediastinal or axillary lymph nodes. Hilar regions are difficult to evaluate without IV contrast. Esophagus is grossly unremarkable. Tiny hiatal hernia.  Lungs/Pleura: A new spiculated nodule in the anterior segment right upper lobe measures 10 x 12 mm (series 5, image 51). Post treatment scarring and architectural distortion in the posterior segment right upper lobe. A new area of consolidation and surrounding ground-glass is seen in the right lower lobe, measuring approximately 3.1 x 3.3 cm (series 5, image 73). 5 mm medial left lower lobe nodule, stable. Subpleural lymph node along the left major fissure. Calcified granuloma in the left upper lobe. No pleural fluid. Airway is unremarkable. Upper Abdomen: Visualized portions of the liver, gallbladder and right adrenal gland are unremarkable. Nodular thickening of both limbs of the left adrenal gland. Low-attenuation lesions in the kidneys measure up to 5.9 cm on the right, incompletely imaged. Visualized portions of the spleen, pancreas, stomach and bowel are grossly unremarkable the exception of a tiny hiatal hernia. No upper abdominal adenopathy. Musculoskeletal: Degenerative changes are seen in  the spine. IMPRESSION: 1. New spiculated right upper lobe nodule is worrisome for a metachronous bronchogenic carcinoma. 2. New area of consolidation and surrounding ground-glass in the right lower lobe may be infectious or inflammatory in etiology. A metachronous bronchogenic carcinoma is not excluded. Please correlate clinically. Consider short-term follow-up CT chest in 3-4 weeks, as clinically indicated. 3. Post treatment scarring in the posterior segment right upper lobe. 4. Aortic atherosclerosis (ICD10-170.0). Three-vessel coronary artery calcification. Electronically Signed   By: Lorin Picket M.D.   On: 05/16/2017 14:39    ASSESSMENT AND PLAN: This is a very pleasant 83 years old stage IA non-small cell lung cancer presented with right upper lobe pulmonary nodule status post curative stereotactic body radiotherapy. The patient is doing fine and tolerated her previous treatment well. She had repeat CT scan of the chest performed recently. The scan showed new right upper lobe pulmonary nodule suspicious for bronchogenic carcinoma.  I recommended for the patient to see Dr. Sondra Come for reevaluation and consideration of stereotactic radiotherapy if needed. She may need repeat PET scan at that time. I will see her back for follow-up visit in 6 months for reevaluation with repeat CT scan of the chest for restaging of her disease. She was advised to call immediately if she has any concerning symptoms in the interval. The patient voices understanding of current disease status and treatment options and is in agreement with the current care plan. All questions were answered. The patient knows to call the clinic with any problems, questions or concerns. We can certainly see the patient much sooner if necessary. I spent 10 minutes counseling the patient face to face. The total time spent in the appointment was 15 minutes.  Disclaimer: This note was dictated with voice recognition software. Similar  sounding words can inadvertently be transcribed and may not be corrected upon review.

## 2017-05-31 NOTE — Progress Notes (Signed)
Thoracic Location of Tumor / Histology: new right upper lobe pulmonary nodule seen on CT chest from 05/16/17  Tobacco/Marijuana/Snuff/ETOH use: former smoker, smoked 0.5 ppd for 40 years.  Past/Anticipated interventions by cardiothoracic surgery, if any: N/A  Past/Anticipated interventions by medical oncology, if any: none  Signs/Symptoms  Weight changes, if any: She reports she may have lost a few pounds.   Respiratory complaints, if any: No  Hemoptysis, if any: No  Pain issues, if any:  No  SAFETY ISSUES:  Prior radiation? 12/19/16-1/24/18SBRT to right upper lobe 54 Gy in 3 fractions   Pacemaker/ICD? No  Possible current pregnancy? no  Is the patient on methotrexate? No  Current Complaints / other details:    BP (!) 141/60   Pulse 65   Temp 98.9 F (37.2 C)   Wt 144 lb (65.3 kg)   SpO2 100% Comment: room air  BMI 27.21 kg/m    Wt Readings from Last 3 Encounters:  06/07/17 144 lb (65.3 kg)  05/23/17 143 lb 6.4 oz (65 kg)  02/08/17 150 lb 3.2 oz (68.1 kg)

## 2017-06-07 ENCOUNTER — Ambulatory Visit
Admission: RE | Admit: 2017-06-07 | Discharge: 2017-06-07 | Disposition: A | Discharge status: Home / Self Care | Payer: Medicare Other | Source: Ambulatory Visit | Attending: Radiation Oncology | Admitting: Radiation Oncology

## 2017-06-07 ENCOUNTER — Encounter: Payer: Self-pay | Admitting: Radiation Oncology

## 2017-06-07 DIAGNOSIS — Z7984 Long term (current) use of oral hypoglycemic drugs: Secondary | ICD-10-CM | POA: Insufficient documentation

## 2017-06-07 DIAGNOSIS — C3491 Malignant neoplasm of unspecified part of right bronchus or lung: Secondary | ICD-10-CM

## 2017-06-07 DIAGNOSIS — Z79899 Other long term (current) drug therapy: Secondary | ICD-10-CM | POA: Diagnosis not present

## 2017-06-07 DIAGNOSIS — Z923 Personal history of irradiation: Secondary | ICD-10-CM | POA: Diagnosis not present

## 2017-06-07 DIAGNOSIS — Z7982 Long term (current) use of aspirin: Secondary | ICD-10-CM | POA: Insufficient documentation

## 2017-06-07 DIAGNOSIS — Z712 Person consulting for explanation of examination or test findings: Secondary | ICD-10-CM | POA: Diagnosis not present

## 2017-06-07 DIAGNOSIS — R911 Solitary pulmonary nodule: Secondary | ICD-10-CM | POA: Diagnosis not present

## 2017-06-07 DIAGNOSIS — Z85118 Personal history of other malignant neoplasm of bronchus and lung: Secondary | ICD-10-CM | POA: Diagnosis not present

## 2017-06-07 DIAGNOSIS — R918 Other nonspecific abnormal finding of lung field: Secondary | ICD-10-CM | POA: Diagnosis not present

## 2017-06-07 NOTE — Progress Notes (Signed)
Radiation Oncology         [phone] ________________________________  Name: Erin Carr MRN: [id number]  Date: 06/07/2017  DOB: [birth year]  Follow-Up Visit Note  CC: Vincente Liberty, MD  Curt Bears, MD    ICD-10-CM   1. Adenocarcinoma of right lung, stage 1 (HCC) C34.91     Diagnosis:    Stage IA(T1b, N0, M0) non-small cell lung cancer, adenocarcinoma presented with right upper lobe lung nodule diagnosed in November 2017  Interval Since Last Radiation:  23 weeks and 1 day 12/19/16-12/27/16  54 Gy in 3 fractions to the right upper lobe (SBRT)  Narrative: Erin Carr 83 y.o. female returns to the clinic today for follow-up visit. She presents today to discuss a new lesion in the right upper lobe.The patient is feeling fine today. She tolerated her previous curative radiotherapy fairly well. She denied having any chest pain, shortness of breath, cough or hemoptysis. She denied having any nausea, vomiting, diarrhea or constipation. She has no significant weight loss or night sweats. She had repeat CT scan of the chest performed recently and she is here for evaluation and discussion of her scan results.                        ALLERGIES:  has No Known Allergies.  Meds: Current Outpatient Prescriptions  Medication Sig Dispense Refill  . albuterol (PROVENTIL HFA;VENTOLIN HFA) 108 (90 Base) MCG/ACT inhaler Inhale 1 puff into the lungs every 4 (four) hours as needed for wheezing or shortness of breath.    Marland Kitchen amLODipine (NORVASC) 10 MG tablet Take 10 mg by mouth daily.    Marland Kitchen aspirin EC 81 MG tablet Take 81 mg by mouth daily.    . ergocalciferol (VITAMIN D2) 50000 units capsule Take 50,000 Units by mouth once a week.    . furosemide (LASIX) 40 MG tablet Take 40 mg by mouth 2 (two) times daily.    Marland Kitchen gabapentin (NEURONTIN) 400 MG capsule Take 400 mg by mouth 2 (two) times daily.    Marland Kitchen glipiZIDE (GLUCOTROL) 5 MG tablet Take 5 mg by mouth daily before breakfast.    . labetalol  (NORMODYNE) 100 MG tablet Take 100 mg by mouth 2 (two) times daily.    Marland Kitchen lisinopril (PRINIVIL,ZESTRIL) 20 MG tablet Take 20 mg by mouth 2 (two) times daily.    . metFORMIN (GLUCOPHAGE) 500 MG tablet Take 500 mg by mouth 2 (two) times daily with a meal.    . potassium chloride (K-DUR) 10 MEQ tablet Take 10 mEq by mouth daily.    . propranolol (INDERAL) 10 MG tablet Take 10 mg by mouth every 12 (twelve) hours.    . simvastatin (ZOCOR) 20 MG tablet Take 20 mg by mouth at bedtime.     . travoprost, benzalkonium, (TRAVATAN) 0.004 % ophthalmic solution Place 1 drop into both eyes at bedtime.     No current facility-administered medications for this encounter.     Physical Findings: The patient is in no acute distress. Patient is alert and oriented.  weight is 144 lb (65.3 kg). Her temperature is 98.9 F (37.2 C). Her blood pressure is 141/60 (abnormal) and her pulse is 65. Her oxygen saturation is 100%. .  No significant changes. Lungs are clear to auscultation bilaterally. Heart has regular rate and rhythm. No palpable cervical, supraclavicular, or axillary adenopathy. Abdomen soft, non-tender, normal bowel sounds.   Lab Findings: Lab Results  Component Value Date   WBC 8.3  05/16/2017   HGB 11.6 05/16/2017   HCT 37.7 05/16/2017   MCV 85.5 05/16/2017   PLT 189 05/16/2017    Radiographic Findings: Ct Chest Wo Contrast  Result Date: 05/16/2017 CLINICAL DATA:  Right lung cancer, radiation therapy complete. EXAM: CT CHEST WITHOUT CONTRAST TECHNIQUE: Multidetector CT imaging of the chest was performed following the standard protocol without IV contrast. COMPARISON:  PET 12/01/2016 and CT chest 09/27/2016. FINDINGS: Cardiovascular: Atherosclerotic calcification of the arterial vasculature, including three-vessel involvement of the coronary arteries. Heart size normal. No pericardial effusion. Mediastinum/Nodes: No pathologically enlarged mediastinal or axillary lymph nodes. Hilar regions are  difficult to evaluate without IV contrast. Esophagus is grossly unremarkable. Tiny hiatal hernia. Lungs/Pleura: A new spiculated nodule in the anterior segment right upper lobe measures 10 x 12 mm (series 5, image 51). Post treatment scarring and architectural distortion in the posterior segment right upper lobe. A new area of consolidation and surrounding ground-glass is seen in the right lower lobe, measuring approximately 3.1 x 3.3 cm (series 5, image 73). 5 mm medial left lower lobe nodule, stable. Subpleural lymph node along the left major fissure. Calcified granuloma in the left upper lobe. No pleural fluid. Airway is unremarkable. Upper Abdomen: Visualized portions of the liver, gallbladder and right adrenal gland are unremarkable. Nodular thickening of both limbs of the left adrenal gland. Low-attenuation lesions in the kidneys measure up to 5.9 cm on the right, incompletely imaged. Visualized portions of the spleen, pancreas, stomach and bowel are grossly unremarkable the exception of a tiny hiatal hernia. No upper abdominal adenopathy. Musculoskeletal: Degenerative changes are seen in the spine. IMPRESSION: 1. New spiculated right upper lobe nodule is worrisome for a metachronous bronchogenic carcinoma. 2. New area of consolidation and surrounding ground-glass in the right lower lobe may be infectious or inflammatory in etiology. A metachronous bronchogenic carcinoma is not excluded. Please correlate clinically. Consider short-term follow-up CT chest in 3-4 weeks, as clinically indicated. 3. Post treatment scarring in the posterior segment right upper lobe. 4. Aortic atherosclerosis (ICD10-170.0). Three-vessel coronary artery calcification. Electronically Signed   By: Lorin Picket M.D.   On: 05/16/2017 14:39    Impression:  Stage IA(T1b, N0, M0) non-small cell lung cancer, adenocarcinoma presented with right upper lobe lung nodule diagnosed in November 2017. Recent CT scan shows a new spiculated  lesion in the right upper lobe worrisome for metachronous bronchogenic carcinoma. I reviewed the patient's chest CT scan with the patient. She would like to proceed with PET scan for further evaluation.  Plan: Patient is scheduled for PET scan and follow up for further evaluation soon after her PET scan.  This document serves as a record of services personally performed by Gery Pray, MD. It was created on his behalf by Valeta Harms, a trained medical scribe. The creation of this record is based on the scribe's personal observations and the provider's statements to them. This document has been checked and approved by the attending provider.

## 2017-06-11 ENCOUNTER — Telehealth: Payer: Self-pay | Admitting: *Deleted

## 2017-06-11 NOTE — Telephone Encounter (Signed)
Called patient to inform of Pet Scan on 06-26-17- arrival time - 8:30 am @ [facility], pt. To be npo- 6 hrs. Prior to test, spoke with patient and she is aware of this test

## 2017-06-21 ENCOUNTER — Telehealth: Payer: Self-pay | Admitting: *Deleted

## 2017-06-21 NOTE — Telephone Encounter (Signed)
CALLED PATIENT TO INFORM OF [facility] APPT. ON 06-28-17 WITH DR. KINARD, SPOKE WITH PATIENT AND SHE IS AWARE OF THIS APPT. I ALSO MAILED THIS PATIENT AN APPT. CARD

## 2017-06-22 NOTE — Progress Notes (Signed)
Thoracic Location of Tumor / Histology: new right upper lobe pulmonary nodule seen on CT chest from 05/16/17  Tobacco/Marijuana/Snuff/ETOH use: former smoker, smoked 0.5 ppd for 40 years.  Past/Anticipated interventions by cardiothoracic surgery, if any: N/A  Past/Anticipated interventions by medical oncology, if any: none  Signs/Symptoms  Weight changes, if any: She reports she may have lost a few pounds.   Respiratory complaints, if any: has an occasional cough with clear sputum  Hemoptysis, if any: No  Pain issues, if any:  No  SAFETY ISSUES:  Prior radiation? 12/19/16-1/24/18SBRT to right upper lobe 54 Gy in 3 fractions   Pacemaker/ICD? No  Possible current pregnancy? no  Is the patient on methotrexate? No  Current Complaints / other details:  Patient is here with her twin sister.  BP (!) 155/81 (BP Location: Left Arm, Patient Position: Sitting)   Pulse 64   Temp 98.3 F (36.8 C)   Ht 5\' 1"  (1.549 m)   Wt 147 lb 9.6 oz (67 kg)   SpO2 100%   BMI 27.89 kg/m    Wt Readings from Last 3 Encounters:  06/07/17 144 lb (65.3 kg)  05/23/17 143 lb 6.4 oz (65 kg)  02/08/17 150 lb 3.2 oz (68.1 kg)

## 2017-06-26 ENCOUNTER — Ambulatory Visit (HOSPITAL_COMMUNITY)
Admission: RE | Admit: 2017-06-26 | Discharge: 2017-06-26 | Disposition: A | Discharge status: Home / Self Care | Payer: Medicare Other | Source: Ambulatory Visit | Attending: Radiation Oncology | Admitting: Radiation Oncology

## 2017-06-26 DIAGNOSIS — R911 Solitary pulmonary nodule: Secondary | ICD-10-CM | POA: Insufficient documentation

## 2017-06-26 DIAGNOSIS — J322 Chronic ethmoidal sinusitis: Secondary | ICD-10-CM | POA: Insufficient documentation

## 2017-06-26 DIAGNOSIS — I700 Atherosclerosis of aorta: Secondary | ICD-10-CM | POA: Insufficient documentation

## 2017-06-26 DIAGNOSIS — C3411 Malignant neoplasm of upper lobe, right bronchus or lung: Secondary | ICD-10-CM | POA: Diagnosis not present

## 2017-06-26 DIAGNOSIS — I2510 Atherosclerotic heart disease of native coronary artery without angina pectoris: Secondary | ICD-10-CM | POA: Insufficient documentation

## 2017-06-26 DIAGNOSIS — C3491 Malignant neoplasm of unspecified part of right bronchus or lung: Secondary | ICD-10-CM | POA: Diagnosis not present

## 2017-06-26 LAB — GLUCOSE, CAPILLARY: GLUCOSE-CAPILLARY: 88 mg/dL (ref 65–99)

## 2017-06-26 MED ORDER — FLUDEOXYGLUCOSE F - 18 (FDG) INJECTION
7.1000 | Freq: Once | INTRAVENOUS | Status: AC | PRN
  Administered 2017-06-26: 7.1 via INTRAVENOUS

## 2017-06-28 ENCOUNTER — Ambulatory Visit
Admission: RE | Admit: 2017-06-28 | Discharge: 2017-06-28 | Disposition: A | Discharge status: Home / Self Care | Payer: Medicare Other | Source: Ambulatory Visit | Attending: Radiation Oncology | Admitting: Radiation Oncology

## 2017-06-28 ENCOUNTER — Encounter: Payer: Self-pay | Admitting: Radiation Oncology

## 2017-06-28 DIAGNOSIS — Z7984 Long term (current) use of oral hypoglycemic drugs: Secondary | ICD-10-CM | POA: Diagnosis not present

## 2017-06-28 DIAGNOSIS — Z7982 Long term (current) use of aspirin: Secondary | ICD-10-CM | POA: Diagnosis not present

## 2017-06-28 DIAGNOSIS — C3491 Malignant neoplasm of unspecified part of right bronchus or lung: Secondary | ICD-10-CM | POA: Diagnosis not present

## 2017-06-28 DIAGNOSIS — Z79899 Other long term (current) drug therapy: Secondary | ICD-10-CM | POA: Diagnosis not present

## 2017-06-28 DIAGNOSIS — C3411 Malignant neoplasm of upper lobe, right bronchus or lung: Secondary | ICD-10-CM | POA: Diagnosis not present

## 2017-06-28 DIAGNOSIS — Z923 Personal history of irradiation: Secondary | ICD-10-CM | POA: Diagnosis not present

## 2017-06-28 DIAGNOSIS — R911 Solitary pulmonary nodule: Secondary | ICD-10-CM | POA: Diagnosis not present

## 2017-06-28 DIAGNOSIS — Z85118 Personal history of other malignant neoplasm of bronchus and lung: Secondary | ICD-10-CM | POA: Diagnosis not present

## 2017-06-28 NOTE — Progress Notes (Signed)
Radiation Oncology         [phone] ________________________________  Name: ODENA MCQUAID MRN: [id number]  Date: 06/28/2017  DOB: 1930/11/22  Re-Consult Visit Note  CC: Vincente Liberty, MD  Curt Bears, MD    ICD-10-CM   1. Adenocarcinoma of right lung, stage 1 (HCC) C34.91     Diagnosis:    Stage IA(T1b, N0, M0) non-small cell lung cancer, adenocarcinoma presented with right upper lobe lung nodule diagnosed in November 2017  Interval Since Last Radiation:  6 months 12/19/16-12/27/16  54 Gy in 3 fractions to the right upper lobe (SBRT)  Narrative: Trilby Drummer 83 y.o. female returns to the clinic today for re-consult visit. She was last seen in our clinic on 06/07/2017 to discuss a new lesion in the right upper lobe seen on 6/13 Chest CT.   PET on 06/26/2017 shows new right upper lobe pulmonary nodule seen on recent CT has a max SUV of 2.7, and may well represent low-grade malignancy. Other prior opacities posteriorly in the right upper lobe and in the right lower lobe are obscured by surrounding high signal intensity airspace opacity, probably from radiation pneumonitis. The small sub solid nodule medially in the left lower lobe appears essentially stable and not hypermetabolic today. There is a faintly hypermetabolic right hilar in right paratracheal lymph nodes probably reflect malignancy involvement. Hypermetabolic activity in the left lobe of the thyroid gland.   On review of systems, the patient reports she may have lost a few pounds. She reports an occasional cough,  clear sputum, further stating "It's about the same". She denies pain or hemoptysis. No shortness of breath               ALLERGIES:  has No Known Allergies.  Meds: Current Outpatient Prescriptions  Medication Sig Dispense Refill  . albuterol (PROVENTIL HFA;VENTOLIN HFA) 108 (90 Base) MCG/ACT inhaler Inhale 1 puff into the lungs every 4 (four) hours as needed for wheezing or shortness of breath.      Marland Kitchen amLODipine (NORVASC) 10 MG tablet Take 10 mg by mouth daily.    Marland Kitchen aspirin EC 81 MG tablet Take 81 mg by mouth daily.    . ergocalciferol (VITAMIN D2) 50000 units capsule Take 50,000 Units by mouth once a week.    . furosemide (LASIX) 40 MG tablet Take 40 mg by mouth 2 (two) times daily.    Marland Kitchen gabapentin (NEURONTIN) 400 MG capsule Take 400 mg by mouth 2 (two) times daily.    Marland Kitchen glipiZIDE (GLUCOTROL) 5 MG tablet Take 5 mg by mouth daily before breakfast.    . labetalol (NORMODYNE) 100 MG tablet Take 100 mg by mouth 2 (two) times daily.    Marland Kitchen lisinopril (PRINIVIL,ZESTRIL) 20 MG tablet Take 20 mg by mouth 2 (two) times daily.    . metFORMIN (GLUCOPHAGE) 500 MG tablet Take 500 mg by mouth 2 (two) times daily with a meal.    . potassium chloride (K-DUR) 10 MEQ tablet Take 10 mEq by mouth daily.    . propranolol (INDERAL) 10 MG tablet Take 10 mg by mouth every 12 (twelve) hours.    . simvastatin (ZOCOR) 20 MG tablet Take 20 mg by mouth at bedtime.     . travoprost, benzalkonium, (TRAVATAN) 0.004 % ophthalmic solution Place 1 drop into both eyes at bedtime.     No current facility-administered medications for this encounter.     Physical Findings: The patient is in no acute distress. Patient is alert and oriented.  height is 5\' 1"  (1.549 m) and weight is 147 lb 9.6 oz (67 kg). Her temperature is 98.3 F (36.8 C). Her blood pressure is 155/81 (abnormal) and her pulse is 64. Her oxygen saturation is 100%. .  No significant changes. Lungs are clear to auscultation bilaterally. Heart has regular rate and rhythm. No palpable cervical, supraclavicular, or axillary adenopathy. Abdomen soft, non-tender, normal bowel sounds. No palpable nodule at left lobe of thyroid gland.   Lab Findings: Lab Results  Component Value Date   WBC 8.3 05/16/2017   HGB 11.6 05/16/2017   HCT 37.7 05/16/2017   MCV 85.5 05/16/2017   PLT 189 05/16/2017    Radiographic Findings: Nm Pet Image Restag (ps) Skull Base To  Thigh  Result Date: 06/26/2017 CLINICAL DATA:  Subsequent treatment strategy for right lung cancer. New lesion in the right upper lobe. EXAM: NUCLEAR MEDICINE PET SKULL BASE TO THIGH TECHNIQUE: 7.1 mCi F-18 FDG was injected intravenously. Full-ring PET imaging was performed from the skull base to thigh after the radiotracer. CT data was obtained and used for attenuation correction and anatomic localization. FASTING BLOOD GLUCOSE:  Value: 88 mg/dl COMPARISON:  Multiple exams, including 12/01/2016 FINDINGS: NECK No hypermetabolic lymph nodes in the neck. Mild chronic ethmoid sinusitis. Carotid atherosclerotic calcification. Hypermetabolic activity in the left lobe of the thyroid gland, maximum SUV 6.9, previously 6.3. CHEST The new right upper lobe pulmonary nodule seen on the CT scan of 05/16/2017 has a maximum SUV of 2.7. In the area of the prior posterior right upper lobe nodule, and in the adjacent superior segment right lower lobe, there is extensive hypermetabolic airspace opacity with maximum SUV 6.5. Previous opacities in this region include a 2.1 cm right upper lobe lesion with a maximum SUV of 1.7, and a rounded airspace opacity in the right lower lobe measuring 3.3 by 3.1 cm on 05/16/2017. I am suspicious of much to current airspace opacity in hypermetabolic activity is related to radiation pneumonitis. Focal right hilar accentuated metabolic activity has a maximum SUV of 4.3 (previously 3.9) a right paratracheal lymph node measuring 0.8 cm in short axis on image 51/4 has maximum standard uptake value of 4.7 (formerly 4.4). Subsolid nodularity medially in the left lower lobe is indistinct and not hypermetabolic today, measuring about 0.7 by 0.5 cm. Coronary, aortic arch, and branch vessel atherosclerotic vascular disease. ABDOMEN/PELVIS No abnormal hypermetabolic activity within the liver, pancreas, adrenal glands, or spleen. No hypermetabolic lymph nodes in the abdomen or pelvis. Considerable physiologic  activity in the colon. Prominent stool throughout the colon favors constipation. Aortoiliac atherosclerotic vascular disease. Photopenic right kidney upper pole cyst. Supraumbilical rectus diastasis.  Probable pelvic floor laxity. Focal anal activity similar to prior, likely physiologic, maximum SUV 8.3, previously 9.7. SKELETON No focal hypermetabolic activity to suggest skeletal metastasis. IMPRESSION: 1. The new right upper lobe pulmonary nodule seen on the recent CT scan from 05/16/2017 has a maximum SUV of 2.7, and may well represent low-grade malignancy. 2. Other prior opacities posteriorly in the right upper lobe and in the right lower lobe are obscured by surrounding high signal intensity airspace opacity, probably from radiation pneumonitis. 3. The small sub solid nodule medially in the left lower lobe appears essentially stable and not hypermetabolic today. 4. Faintly hypermetabolic right hilar in right paratracheal lymph nodes probably reflect malignant involvement. 5. Hypermetabolic activity in the left lobe of the thyroid gland. Any significant minority of cases this can indicate thyroid cancer. Correlate with prior workup history. 6. Focal anal  activity similar to prior and probably physiologic. 7. Other imaging findings of potential clinical significance: Aortic Atherosclerosis (ICD10-I70.0). Coronary atherosclerosis. Mild chronic ethmoid sinusitis. Supraumbilical rectus diastases. Probable pelvic floor laxity. Prominent stool throughout the colon favors constipation. Electronically Signed   By: Van Clines M.D.   On: 06/26/2017 10:59     Impression:  Stage IA(T1b, N0, M0) non-small cell lung cancer, adenocarcinoma presented with right upper lobe lung nodule diagnosed in November 2017. Recent PET scan shows possible low-grade malignancy in the right upper lobe. It does show significant inflammation in the area of previous treatment consistent with radiation pneumonitis but the patient is  completely asymptomatic concerning this PET scan finding. I reviewed the patient's PET scan findings with her and discussed the  uptake in the right paratracheal area and right hilar area.  Options to consider would be to treat this new area in the right upper lobe with SBRT or to proceed with bronchoscopy and mediastinoscopy to evaluate the  uptake in the right paratracheal and right hilar region.  Plan: Her case will be presented at our multidisciplinary lung conference next week to discuss treatment recommendations. We will contact the patient  next Thursday with these recommendations.  This document serves as a record of services personally performed by Gery Pray, MD. It was created on his behalf by Arlyce Harman, a trained medical scribe. The creation of this record is based on the scribe's personal observations and the provider's statements to them. This document has been checked and approved by the attending provider.

## 2017-06-28 NOTE — Progress Notes (Signed)
Please see the Nurse Progress Note in the MD Initial Consult Encounter for this patient. 

## 2017-07-05 ENCOUNTER — Encounter: Payer: Self-pay | Admitting: *Deleted

## 2017-07-05 ENCOUNTER — Encounter: Payer: Self-pay | Admitting: Radiation Oncology

## 2017-07-05 ENCOUNTER — Other Ambulatory Visit: Payer: Self-pay

## 2017-07-05 DIAGNOSIS — D381 Neoplasm of uncertain behavior of trachea, bronchus and lung: Secondary | ICD-10-CM

## 2017-07-05 DIAGNOSIS — C3491 Malignant neoplasm of unspecified part of right bronchus or lung: Secondary | ICD-10-CM

## 2017-07-05 NOTE — Progress Notes (Signed)
The patient's images and case was presented at the multidisciplinary thoracic oncology conference this morning. It was recommended the patient have a repeat chest CT (super/brochial nav) scan in 2 months for further evaluation. Patient will be seen soon afterwards for follow-up and possible bronchial navigation/ biopsy.   Blair Promise, MD

## 2017-07-05 NOTE — Progress Notes (Signed)
Oncology Nurse Navigator Documentation  Oncology Nurse Navigator Flowsheets 07/05/2017  Navigator Location [facility]  Navigator Encounter Type Other/per Dr. Sondra Come, I placed order for CT super D on 09/04/17.  I called [facility] office to verify order set.   Treatment Phase Follow-up  Barriers/Navigation Needs Coordination of Care  Interventions Coordination of Care  Coordination of Care Other  Acuity Level 2  Acuity Level 2 Other  Time Spent with Patient 30

## 2017-07-18 ENCOUNTER — Emergency Department (HOSPITAL_COMMUNITY)
Admission: EM | Admit: 2017-07-18 | Discharge: 2017-07-18 | Disposition: A | Discharge status: Home / Self Care | Payer: Medicare Other | Attending: Physician Assistant | Admitting: Physician Assistant

## 2017-07-18 ENCOUNTER — Encounter (HOSPITAL_COMMUNITY): Payer: Self-pay

## 2017-07-18 ENCOUNTER — Emergency Department (HOSPITAL_COMMUNITY): Payer: Medicare Other

## 2017-07-18 DIAGNOSIS — C3491 Malignant neoplasm of unspecified part of right bronchus or lung: Secondary | ICD-10-CM

## 2017-07-18 DIAGNOSIS — D509 Iron deficiency anemia, unspecified: Secondary | ICD-10-CM

## 2017-07-18 DIAGNOSIS — J45901 Unspecified asthma with (acute) exacerbation: Secondary | ICD-10-CM

## 2017-07-18 DIAGNOSIS — I10 Essential (primary) hypertension: Secondary | ICD-10-CM | POA: Diagnosis not present

## 2017-07-18 DIAGNOSIS — Z79899 Other long term (current) drug therapy: Secondary | ICD-10-CM | POA: Diagnosis not present

## 2017-07-18 DIAGNOSIS — Z87891 Personal history of nicotine dependence: Secondary | ICD-10-CM | POA: Diagnosis not present

## 2017-07-18 DIAGNOSIS — D649 Anemia, unspecified: Secondary | ICD-10-CM | POA: Insufficient documentation

## 2017-07-18 DIAGNOSIS — Z7984 Long term (current) use of oral hypoglycemic drugs: Secondary | ICD-10-CM | POA: Diagnosis not present

## 2017-07-18 DIAGNOSIS — J449 Chronic obstructive pulmonary disease, unspecified: Secondary | ICD-10-CM | POA: Insufficient documentation

## 2017-07-18 DIAGNOSIS — E7800 Pure hypercholesterolemia, unspecified: Secondary | ICD-10-CM | POA: Diagnosis not present

## 2017-07-18 DIAGNOSIS — J181 Lobar pneumonia, unspecified organism: Secondary | ICD-10-CM

## 2017-07-18 DIAGNOSIS — R062 Wheezing: Secondary | ICD-10-CM | POA: Diagnosis present

## 2017-07-18 DIAGNOSIS — J189 Pneumonia, unspecified organism: Secondary | ICD-10-CM | POA: Insufficient documentation

## 2017-07-18 DIAGNOSIS — R05 Cough: Secondary | ICD-10-CM | POA: Diagnosis not present

## 2017-07-18 DIAGNOSIS — E119 Type 2 diabetes mellitus without complications: Secondary | ICD-10-CM

## 2017-07-18 LAB — RETICULOCYTES
RBC.: 2.9 MIL/uL — AB (ref 3.87–5.11)
RETIC COUNT ABSOLUTE: 43.5 10*3/uL (ref 19.0–186.0)
RETIC CT PCT: 1.5 % (ref 0.4–3.1)

## 2017-07-18 LAB — COMPREHENSIVE METABOLIC PANEL
ALK PHOS: 92 U/L (ref 38–126)
ALT: 67 U/L — ABNORMAL HIGH (ref 14–54)
ANION GAP: 14 (ref 5–15)
AST: 64 U/L — ABNORMAL HIGH (ref 15–41)
Albumin: 2.3 g/dL — ABNORMAL LOW (ref 3.5–5.0)
BILIRUBIN TOTAL: 0.8 mg/dL (ref 0.3–1.2)
BUN: 11 mg/dL (ref 6–20)
CHLORIDE: 101 mmol/L (ref 101–111)
CO2: 22 mmol/L (ref 22–32)
Calcium: 8.8 mg/dL — ABNORMAL LOW (ref 8.9–10.3)
Creatinine, Ser: 0.69 mg/dL (ref 0.44–1.00)
GFR calc non Af Amer: >60 mL/min (ref >60)
GLUCOSE: 97 mg/dL (ref 65–99)
POTASSIUM: 3.7 mmol/L (ref 3.5–5.1)
SODIUM: 137 mmol/L (ref 135–145)
Total Protein: 6.3 g/dL — ABNORMAL LOW (ref 6.5–8.1)

## 2017-07-18 LAB — FOLATE: FOLATE: 15.7 ng/mL (ref >5.9)

## 2017-07-18 LAB — CBC WITH DIFFERENTIAL/PLATELET
Basophils Absolute: 0 10*3/uL (ref 0.0–0.1)
Basophils Relative: 0 %
Eosinophils Absolute: 1.6 10*3/uL — ABNORMAL HIGH (ref 0.0–0.7)
Eosinophils Relative: 11 %
HEMATOCRIT: 28 % — AB (ref 36.0–46.0)
HEMOGLOBIN: 8.7 g/dL — AB (ref 12.0–15.0)
LYMPHS PCT: 4 %
Lymphs Abs: 0.5 10*3/uL — ABNORMAL LOW (ref 0.7–4.0)
MCH: 24.6 pg — ABNORMAL LOW (ref 26.0–34.0)
MCHC: 31.1 g/dL (ref 30.0–36.0)
MCV: 79.1 fL (ref 78.0–100.0)
MONO ABS: 1.6 10*3/uL — AB (ref 0.1–1.0)
MONOS PCT: 11 %
NEUTROS ABS: 11.2 10*3/uL — AB (ref 1.7–7.7)
NEUTROS PCT: 74 %
Platelets: 554 10*3/uL — ABNORMAL HIGH (ref 150–400)
RBC: 3.54 MIL/uL — ABNORMAL LOW (ref 3.87–5.11)
RDW: 14.2 % (ref 11.5–15.5)
WBC: 14.9 10*3/uL — ABNORMAL HIGH (ref 4.0–10.5)

## 2017-07-18 LAB — I-STAT CG4 LACTIC ACID, ED
Lactic Acid, Venous: 1.48 mmol/L (ref 0.5–1.9)
Lactic Acid, Venous: 0.93 mmol/L (ref 0.5–1.9)

## 2017-07-18 LAB — PROCALCITONIN: Procalcitonin: <0.1 ng/mL

## 2017-07-18 LAB — IRON AND TIBC
Iron: 7 ug/dL — ABNORMAL LOW (ref 28–170)
SATURATION RATIOS: 4 % — AB (ref 10.4–31.8)
TIBC: 174 ug/dL — ABNORMAL LOW (ref 250–450)
UIBC: 167 ug/dL

## 2017-07-18 LAB — VITAMIN B12: Vitamin B-12: 456 pg/mL (ref 180–914)

## 2017-07-18 LAB — FERRITIN: FERRITIN: 159 ng/mL (ref 11–307)

## 2017-07-18 MED ORDER — ACETAMINOPHEN 500 MG PO TABS
1000.0000 mg | ORAL_TABLET | Freq: Once | ORAL | Status: AC
  Administered 2017-07-18: 1000 mg via ORAL
  Filled 2017-07-18: qty 2

## 2017-07-18 MED ORDER — PREDNISONE 20 MG PO TABS: 40.0000 mg | ORAL_TABLET | Freq: Every day | ORAL | 0 refills | Status: DC

## 2017-07-18 MED ORDER — IPRATROPIUM-ALBUTEROL 0.5-2.5 (3) MG/3ML IN SOLN
3.0000 mL | Freq: Once | RESPIRATORY_TRACT | Status: AC
  Administered 2017-07-18: 3 mL via RESPIRATORY_TRACT
  Filled 2017-07-18: qty 3

## 2017-07-18 MED ORDER — LEVOFLOXACIN 500 MG PO TABS: 500.0000 mg | ORAL_TABLET | Freq: Every day | ORAL | 0 refills | Status: DC

## 2017-07-18 MED ORDER — DEXTROSE 5 % IV SOLN
1.0000 g | INTRAVENOUS | Status: DC
  Administered 2017-07-18: 1 g via INTRAVENOUS
  Filled 2017-07-18: qty 10

## 2017-07-18 MED ORDER — FERROUS SULFATE 325 (65 FE) MG PO TABS: 325.0000 mg | ORAL_TABLET | Freq: Every day | ORAL | 0 refills | Status: DC

## 2017-07-18 MED ORDER — METHYLPREDNISOLONE SODIUM SUCC 125 MG IJ SOLR
60.0000 mg | Freq: Once | INTRAMUSCULAR | Status: AC
  Administered 2017-07-18: 60 mg via INTRAVENOUS
  Filled 2017-07-18: qty 2

## 2017-07-18 MED ORDER — DEXTROSE 5 % IV SOLN
500.0000 mg | INTRAVENOUS | Status: DC
  Administered 2017-07-18 (×2): 500 mg via INTRAVENOUS
  Filled 2017-07-18: qty 500

## 2017-07-18 MED ORDER — SODIUM CHLORIDE 0.9 % IV BOLUS (SEPSIS)
1000.0000 mL | Freq: Once | INTRAVENOUS | Status: AC
  Administered 2017-07-18: 1000 mL via INTRAVENOUS

## 2017-07-18 MED ORDER — METHYLPREDNISOLONE SODIUM SUCC 125 MG IJ SOLR: 60.0000 mg | Freq: Once | INTRAMUSCULAR | Status: DC

## 2017-07-18 NOTE — ED Provider Notes (Signed)
Erin Carr DEPT Provider Note   CSN: [id number] Arrival date & time: 07/18/17  1114     History   Chief Complaint Chief Complaint  Patient presents with  . Foot Swelling  . Wheezing  . Fever    HPI Erin Carr is a 83 y.o. female.  HPI 83 year old African-American female past medical history significant for COPD, asthma, adenocarcinoma of the right lung currently receiving radiation, hypertension, diabetes that presents to the ED today with complaints of wheezing, shortness of breath, fever, chills, nonproductive cough. Patient states her symptoms started yesterday. Patient reports wheezing at home. She has not used her inhalers at home. She also reports some bilateral worsening ankle swelling. The patient states that she does have adenocarcinoma the right lung. She has been receiving radiation her last treatment was 4 months ago. Followed by oncology with regular visits. She denies any URI symptoms. She denies any chest pain. Nothing makes better or worse. Denies any history of DVT/PE, prolonged immobilizations, recent hospitalizations/surgeries, unilateral leg swelling or calf tenderness.  Pt denies any fever, chill, ha, vision changes, lightheadedness, dizziness, congestion, neck pain, cp, abd pain, n/v/d, urinary symptoms, change in bowel habits, melena, hematochezia, lower extremity paresthesias.  Past Medical History:  Diagnosis Date  . Adenocarcinoma of right lung, stage 1 ([place]) 11/16/2016  . Arthritis   . Asthma   . COPD (chronic obstructive pulmonary disease) ([place])   . Diabetes mellitus without complication ([place])    Type II  . Family history of adverse reaction to anesthesia    "aunt had PONV"  . History of radiation therapy 12/19/16-12/27/16   SBRT to right upper lobe 54 Gy in 3 fractions  . Hypercholesteremia   . Hypertension   . Osteoporosis   . Positive PPD 12/16   PER PCP  . Shortness of breath dyspnea   . Tremors of nervous system    Left hand  .  Urinary frequency   . Vitamin D deficiency   . Wears glasses     Patient Active Problem List   Diagnosis Date Noted  . Adenocarcinoma of right lung, stage 1 ([place]) 11/16/2016  . Neoplasm of uncertain behavior of right upper lobe of lung 12/01/2015  . Positive PPD   . Pelvic prolapse 12/16/2014    Past Surgical History:  Procedure Laterality Date  . ABDOMINAL HYSTERECTOMY    . ANTERIOR AND POSTERIOR REPAIR N/A 12/16/2014   Procedure: ANTERIOR (CYSTOCELE) AND POSTERIOR REPAIR (RECTOCELE);  Surgeon: Crawford Givens, MD;  Location: [facility];  Service: Gynecology;  Laterality: N/A;  . APPENDECTOMY    . BOWEL RESECTION    . BREAST BIOPSY Left   . COLONOSCOPY    . VIDEO BRONCHOSCOPY WITH ENDOBRONCHIAL ULTRASOUND N/A 12/17/2015   Procedure: VIDEO BRONCHOSCOPY WITH ENDOBRONCHIAL ULTRASOUND;  Surgeon: Ivin Poot, MD;  Location: [facility];  Service: Thoracic;  Laterality: N/A;    OB History    No data available       Home Medications    Prior to Admission medications   Medication Sig Start Date End Date Taking? Authorizing Provider  albuterol (PROVENTIL HFA;VENTOLIN HFA) 108 (90 Base) MCG/ACT inhaler Inhale 1 puff into the lungs every 4 (four) hours as needed for wheezing or shortness of breath.   Yes [provider]  amLODipine (NORVASC) 10 MG tablet Take 10 mg by mouth daily.   Yes [provider]  aspirin EC 81 MG tablet Take 81 mg by mouth daily.   Yes [provider]  ergocalciferol (VITAMIN  D2) 50000 units capsule Take 50,000 Units by mouth once a week.   Yes [provider]  furosemide (LASIX) 40 MG tablet Take 40 mg by mouth 2 (two) times daily.   Yes [provider]  gabapentin (NEURONTIN) 400 MG capsule Take 400 mg by mouth 2 (two) times daily.   Yes [provider]  glipiZIDE (GLUCOTROL) 5 MG tablet Take 5 mg by mouth daily before breakfast.   Yes [provider]  labetalol (NORMODYNE) 100 MG tablet Take 100 mg by  mouth 2 (two) times daily.   Yes [provider]  lisinopril (PRINIVIL,ZESTRIL) 20 MG tablet Take 20 mg by mouth 2 (two) times daily.   Yes [provider]  metFORMIN (GLUCOPHAGE) 500 MG tablet Take 500 mg by mouth 2 (two) times daily with a meal.   Yes [provider]  potassium chloride (K-DUR) 10 MEQ tablet Take 10 mEq by mouth daily.   Yes [provider]  propranolol (INDERAL) 10 MG tablet Take 10 mg by mouth every 12 (twelve) hours.   Yes [provider]  simvastatin (ZOCOR) 20 MG tablet Take 20 mg by mouth at bedtime.    Yes [provider]  travoprost, benzalkonium, (TRAVATAN) 0.004 % ophthalmic solution Place 1 drop into both eyes at bedtime.   Yes [provider]    Family History Family History  Problem Relation Age of Onset  . Hypertension Father   . Diabetes Father     Social History Social History  Substance Use Topics  . Smoking status: Former Smoker    Packs/day: 0.50    Years: 40.00    Types: Cigarettes    Quit date: 12/08/2001  . Smokeless tobacco: Never Used     Comment: quit about 15-16 years ago; 12/09/2015  . Alcohol use No     Allergies   Patient has no known allergies.   Review of Systems Review of Systems  Constitutional: Negative for chills, diaphoresis and fever.  HENT: Negative for congestion.   Eyes: Negative for visual disturbance.  Respiratory: Positive for cough, shortness of breath and wheezing.   Cardiovascular: Positive for leg swelling (bilatearl ankle). Negative for chest pain and palpitations.  Gastrointestinal: Negative for abdominal pain, diarrhea, nausea and vomiting.  Genitourinary: Negative for dysuria, flank pain, frequency, hematuria and urgency.  Musculoskeletal: Negative for arthralgias and myalgias.  Skin: Negative for rash.  Neurological: Negative for dizziness, syncope, weakness, light-headedness, numbness and headaches.  Psychiatric/Behavioral: Negative for sleep  disturbance. The patient is not nervous/anxious.      Physical Exam Updated Vital Signs BP (!) 156/109 (BP Location: Left Arm)   Pulse 79   Temp (!) 100.4 F (38 C) (Oral)   Resp 20   Ht 5\' 1"  (1.549 m)   Wt 68.9 kg (152 lb)   SpO2 100%   BMI 28.72 kg/m   Physical Exam  Constitutional: She is oriented to person, place, and time. She appears well-developed and well-nourished.  Non-toxic appearance. No distress.  HENT:  Head: Normocephalic and atraumatic.  Nose: Nose normal.  Mouth/Throat: Oropharynx is clear and moist.  Eyes: Pupils are equal, round, and reactive to light. Conjunctivae are normal. Right eye exhibits no discharge. Left eye exhibits no discharge.  Neck: Normal range of motion. Neck supple. No JVD present. No tracheal deviation present.  Cardiovascular: Normal rate, regular rhythm, normal heart sounds and intact distal pulses.  Exam reveals no gallop and no friction rub.   No murmur heard. Pulmonary/Chest: Effort normal.  No respiratory distress. She has decreased breath sounds. She has wheezes. She has rhonchi. She has no rales. She exhibits no tenderness.  Course sounds noted the right upper lung. Mild scattered expiratory wheezes.  Abdominal: Soft. Bowel sounds are normal. She exhibits no distension. There is no tenderness. There is no rebound and no guarding.  Musculoskeletal: Normal range of motion.  Trace pitting edema to the bilateral ankles. No calf tenderness.  Lymphadenopathy:    She has no cervical adenopathy.  Neurological: She is alert and oriented to person, place, and time.  Skin: Skin is warm and dry. Capillary refill takes less than 2 seconds. She is not diaphoretic.  Psychiatric: Her behavior is normal. Judgment and thought content normal.  Nursing note and vitals reviewed.    ED Treatments / Results  Labs (all labs ordered are listed, but only abnormal results are displayed) Labs Reviewed  COMPREHENSIVE METABOLIC PANEL - Abnormal; Notable  for the following:       Result Value   Calcium 8.8 (*)    Total Protein 6.3 (*)    Albumin 2.3 (*)    AST 64 (*)    ALT 67 (*)    All other components within normal limits  CBC WITH DIFFERENTIAL/PLATELET - Abnormal; Notable for the following:    WBC 14.9 (*)    RBC 3.54 (*)    Hemoglobin 8.7 (*)    HCT 28.0 (*)    MCH 24.6 (*)    Platelets 554 (*)    Neutro Abs 11.2 (*)    Lymphs Abs 0.5 (*)    Monocytes Absolute 1.6 (*)    Eosinophils Absolute 1.6 (*)    All other components within normal limits  CULTURE, BLOOD (ROUTINE X 2)  CULTURE, BLOOD (ROUTINE X 2)  URINE CULTURE  URINALYSIS, ROUTINE W REFLEX MICROSCOPIC  I-STAT CG4 LACTIC ACID, ED    EKG  EKG Interpretation None       Radiology Dg Chest 2 View  Result Date: 07/18/2017 CLINICAL DATA:  Cough, fever. EXAM: CHEST  2 VIEW COMPARISON:  Radiograph of December 17, 2015. FINDINGS: The heart size and mediastinal contours are within normal limits. No pneumothorax or pleural effusion is noted. Left lung is clear. Atherosclerosis of thoracic aorta is noted. Large right upper lobe airspace opacity is noted consistent with pneumonia. The visualized skeletal structures are unremarkable. IMPRESSION: Right upper lobe pneumonia. Followup PA and lateral chest X-ray is recommended in 3-4 weeks following trial of antibiotic therapy to ensure resolution and exclude underlying malignancy. Electronically Signed   By: Marijo Conception, M.D.   On: 07/18/2017 12:27    Procedures Procedures (including critical care time)  Medications Ordered in ED Medications  sodium chloride 0.9 % bolus 1,000 mL (1,000 mLs Intravenous New Bag/Given 07/18/17 1354)  cefTRIAXone (ROCEPHIN) 1 g in dextrose 5 % 50 mL IVPB (1 g Intravenous New Bag/Given 07/18/17 1354)  azithromycin (ZITHROMAX) 500 mg in dextrose 5 % 250 mL IVPB (not administered)  ipratropium-albuterol (DUONEB) 0.5-2.5 (3) MG/3ML nebulizer solution 3 mL (3 mLs Nebulization Given 07/18/17 1354)    acetaminophen (TYLENOL) tablet 1,000 mg (1,000 mg Oral Given 07/18/17 1354)     Initial Impression / Assessment and Plan / ED Course  I have reviewed the triage vital signs and the nursing notes.  Pertinent labs & imaging results that were available during my care of the patient were reviewed by me and considered in my medical decision making (see chart for details).  Patient resents to the ED with complaints of cough, fevers, wheezing, shortness of breath. History of lung cancer. Patient is febrile in the ED. No tachycardia is noted. Pressures are normal.  Patient is overall well-appearing and nontoxic. She does have a course sounds noted the right upper lung. Scattered wheezes noted. Otherwise exam is unremarkable.  Chest x-ray shows a infiltrate in the right upper lung consistent with pneumonia. Patient leukocytosis of 14,000. Hemoglobin slightly decreased at 8.7. Patient denies any melena or hematochezia. We'll need to trend. Mildly elevated AST and ALT at baseline. Creatinine is normal. Patient given breathing treatment in the ED. Improvement of patient's wheezing. Tylenol given for fever.  We'll start patient on antibiotics for community-acquired pneumonia. Lactic was normal. Have not initiated sepsis protocol.  [facility] consult hospital medicine for admission.  Spoke with Dr. Marily Memos with hospital medicine who comes to the ED to evaluate patient. They feel the patient can be discharged home. Please see his full note for recommendations and outpatient medicine. Antibiotic for finishing at this time. We'll send patient home on prednisone, Levaquin. She has albuterol inhalers at home. Hemoglobin is slightly low likely due to iron deficiency. Hospitalist recommends iron supplements after completion of pneumonia antibiotics.  Patient with current score 1. She was offered admission and declined.  Patient seen and evaluated with my attending who is agreeable to the above plan.  Pt is  hemodynamically stable, in NAD, & able to ambulate in the ED. Evaluation does not show pathology that would require ongoing emergent intervention or inpatient treatment. I explained the diagnosis to the patient. Pain has been managed & has no complaints prior to dc. Pt is comfortable with above plan and is stable for discharge at this time. All questions were answered prior to disposition. Strict return precautions for f/u to the ED were discussed. Encouraged follow up with PCP.   Final Clinical Impressions(s) / ED Diagnoses   Final diagnoses:  Community acquired pneumonia of right upper lobe of lung (HCC)  Anemia, unspecified type    New Prescriptions New Prescriptions   FERROUS SULFATE 325 (65 FE) MG TABLET    Take 1 tablet (325 mg total) by mouth daily.   LEVOFLOXACIN (LEVAQUIN) 500 MG TABLET    Take 1 tablet (500 mg total) by mouth daily.   PREDNISONE (DELTASONE) 20 MG TABLET    Take 2 tablets (40 mg total) by mouth daily with breakfast.     Doristine Devoid, PA-C 07/18/17 1606    [place], Fredia Sorrow, MD 07/19/17 1539

## 2017-07-18 NOTE — ED Triage Notes (Signed)
Pt endorses foot swelling x 1 week with weakness, chills and wheezing. Auditory wheezing heard by this RN. Oral temp 100.4. Pt has recent diagnosis of lung cancer but not in treatment yet.

## 2017-07-18 NOTE — ED Notes (Signed)
ED Provider at bedside. 

## 2017-07-18 NOTE — Discharge Instructions (Signed)
You have been diagnosed with pneumonia. Please take the Levaquin which his antibiotic once a day for 7 days.   - Albuterol nebulizer or MDI every 4-6 hours 24 hours then as needed thereafter. - Prednisone 40 mg every morning with breakfast 4 days  - Continue home glipizide and metformin - She is to continue with her steroids unless her glucose gets above 300.  Her hemoglobin was slightly low. Iron supplementation to be started after resolution of pneumonia. Follow up PCP to have your hemoglobin recheck.  Please follow-up with her primary care doctor and oncologist. Return to the ED if he develops any worsening symptoms.

## 2017-07-18 NOTE — Consult Note (Signed)
Medical Consultation   Erin Carr  LEX:[id number]  DOB: Jun 28, 1930  DOA: 07/18/2017  PCP: Erin Liberty, MD   Outpatient Specialists: oncology   Requesting physician: Tanda Rockers - ED PA  Reason for consultation: Admission   History of Present Illness: Erin Carr is an 83 y.o. female adenocarcinoma of the right lung, asthma, COPD, diabetes, hyperlipidemia, hypertension, urinary frequency. Patient reports approximately 1 week history of "feeling bad. "This seems to be best described as malaise. Patient is been able to perform all of her ADLs and reports a strong appetite. Over the last day patient became significantly worse with symptoms including wheezing and coughing. Patient utilized her home albuterol inhaler once without significant improvement. Patient endorses subjective fevers. Patient lives by self. Patient denies any chest pain, palpitations, abdominal pain, dysuria, frequency, hematochezia, melena, hematemesis, flank pain, neck stiffness, headache, focal neurological deficits.  In the ED patient received 2 albuterol nebulizer treatments and 1000 mL normal saline bolus. Patient states that she feels much better.  Review of Systems:  ROS As per HPI otherwise all other systems reviewed and are negative   Past Medical History: Past Medical History:  Diagnosis Date  . Adenocarcinoma of right lung, stage 1 ([place]) 11/16/2016  . Arthritis   . Asthma   . COPD (chronic obstructive pulmonary disease) ([place])   . Diabetes mellitus without complication ([place])    Type II  . Family history of adverse reaction to anesthesia    "aunt had PONV"  . History of radiation therapy 12/19/16-12/27/16   SBRT to right upper lobe 54 Gy in 3 fractions  . Hypercholesteremia   . Hypertension   . Osteoporosis   . Positive PPD 12/16   PER PCP  . Shortness of breath dyspnea   . Tremors of nervous system    Left hand  . Urinary frequency   . Vitamin D deficiency   .  Wears glasses     Past Surgical History: Past Surgical History:  Procedure Laterality Date  . ABDOMINAL HYSTERECTOMY    . ANTERIOR AND POSTERIOR REPAIR N/A 12/16/2014   Procedure: ANTERIOR (CYSTOCELE) AND POSTERIOR REPAIR (RECTOCELE);  Surgeon: Crawford Givens, MD;  Location: [facility];  Service: Gynecology;  Laterality: N/A;  . APPENDECTOMY    . BOWEL RESECTION    . BREAST BIOPSY Left   . COLONOSCOPY    . VIDEO BRONCHOSCOPY WITH ENDOBRONCHIAL ULTRASOUND N/A 12/17/2015   Procedure: VIDEO BRONCHOSCOPY WITH ENDOBRONCHIAL ULTRASOUND;  Surgeon: Ivin Poot, MD;  Location: [facility] OR;  Service: Thoracic;  Laterality: N/A;     Allergies:  No Known Allergies   Social History:  reports that she quit smoking about 15 years ago. Her smoking use included Cigarettes. She has a 20.00 pack-year smoking history. She has never used smokeless tobacco. She reports that she does not drink alcohol or use drugs.   Family History: Family History  Problem Relation Age of Onset  . Hypertension Father   . Diabetes Father      Physical Exam: Vitals:   07/18/17 1358 07/18/17 1415 07/18/17 1430 07/18/17 1445  BP: (!) 154/57 (!) 154/58 (!) 138/52 (!) 137/49  Pulse: 80 81 85 81  Resp:   15 (!) 23  Temp:      TempSrc:      SpO2: 94% 100% 98% 97%  Weight:      Height:        General:  Appears  calm and comfortable Eyes:  PERRL, EOMI, normal lids, iris ENT:  grossly normal hearing, lips & tongue, mmm Neck:  no LAD, masses or thyromegaly Cardiovascular:  RRR, no m/r/g. 1+ bilateral lower extremity pitting edema Respiratory:  And respiratory wheezing, normal effort. No rhonchi or crackles. Abdomen:  soft, ntnd, NABS Skin:  no rash or induration seen on limited exam Musculoskeletal:  grossly normal tone BUE/BLE, good ROM, no bony abnormality Psychiatric:  grossly normal mood and affect, speech fluent and appropriate, AOx3 Neurologic:  CN 2-12 grossly intact, moves all extremities in coordinated fashion,  sensation intact  Data reviewed:  I have personally reviewed following labs and imaging studies Labs:  CBC:  Recent Labs Lab 07/18/17 1153  WBC 14.9*  NEUTROABS 11.2*  HGB 8.7*  HCT 28.0*  MCV 79.1  PLT 554*    Basic Metabolic Panel:  Recent Labs Lab 07/18/17 1153  NA 137  K 3.7  CL 101  CO2 22  GLUCOSE 97  BUN 11  CREATININE 0.69  CALCIUM 8.8*   GFR Estimated Creatinine Clearance: 46.4 mL/min (by C-G formula based on SCr of 0.69 mg/dL). Liver Function Tests:  Recent Labs Lab 07/18/17 1153  AST 64*  ALT 67*  ALKPHOS 92  BILITOT 0.8  PROT 6.3*  ALBUMIN 2.3*   No results for input(s): LIPASE, AMYLASE in the last 168 hours. No results for input(s): AMMONIA in the last 168 hours. Coagulation profile No results for input(s): INR, PROTIME in the last 168 hours.  Cardiac Enzymes: No results for input(s): CKTOTAL, CKMB, CKMBINDEX, TROPONINI in the last 168 hours. BNP: Invalid input(s): POCBNP CBG: No results for input(s): GLUCAP in the last 168 hours. D-Dimer No results for input(s): DDIMER in the last 72 hours. Hgb A1c No results for input(s): HGBA1C in the last 72 hours. Lipid Profile No results for input(s): CHOL, HDL, LDLCALC, TRIG, CHOLHDL, LDLDIRECT in the last 72 hours. Thyroid function studies No results for input(s): TSH, T4TOTAL, T3FREE, THYROIDAB in the last 72 hours.  Invalid input(s): FREET3 Anemia work up No results for input(s): VITAMINB12, FOLATE, FERRITIN, TIBC, IRON, RETICCTPCT in the last 72 hours. Urinalysis No results found for: COLORURINE, APPEARANCEUR, LABSPEC, [place], GLUCOSEU, HGBUR, BILIRUBINUR, KETONESUR, PROTEINUR, UROBILINOGEN, NITRITE, [place]   Microbiology No results found for this or any previous visit (from the past 240 hour(s)).     Inpatient Medications:   Scheduled Meds: . methylPREDNISolone (SOLU-MEDROL) injection  60 mg Intravenous Once   Continuous Infusions: . azithromycin 500 mg (07/18/17 1444)    . cefTRIAXone (ROCEPHIN)  IV Stopped (07/18/17 1443)     Radiological Exams on Admission: Dg Chest 2 View  Result Date: 07/18/2017 CLINICAL DATA:  Cough, fever. EXAM: CHEST  2 VIEW COMPARISON:  Radiograph of December 17, 2015. FINDINGS: The heart size and mediastinal contours are within normal limits. No pneumothorax or pleural effusion is noted. Left lung is clear. Atherosclerosis of thoracic aorta is noted. Large right upper lobe airspace opacity is noted consistent with pneumonia. The visualized skeletal structures are unremarkable. IMPRESSION: Right upper lobe pneumonia. Followup PA and lateral chest X-ray is recommended in 3-4 weeks following trial of antibiotic therapy to ensure resolution and exclude underlying malignancy. Electronically Signed   By: Marijo Conception, M.D.   On: 07/18/2017 12:27    Impression/Recommendations Active Problems:   Adenocarcinoma of right lung, stage 1 (HCC)   Microcytic anemia   CAP (community acquired pneumonia)   Asthma, chronic, unspecified asthma severity, with acute exacerbation   Type 2  diabetes mellitus without complication (HCC)  CAP: PSI score 84. Discussed inpt vs outpt management of pt condition and pt requesting to go home. Patient is not requiring any O2, and shows no signs of severe systemic illness such as sepsis/SIRS. Patient reiterates that she can perform all of her ADLs and has a strong appetite. Given Rocephin and azithromycin in the ED. At this time patient is stable and safe for discharge with the following recommendations - Discharge home with 7 day course of Levaquin - Pro-calcitonin ordered to be drawn prior to discharge.  Adenocarcinoma of the lung: Likely contributed to patient's susceptibility to pneumonia. - Continue treatment per discussions with oncology team  Microcytic anemia: Hemoglobin 8.7 with MCV 79.1. Baseline 11.1. No history of appreciable GI loss. Suspect patient has fairly iron deficient and may be due to  malignancy. Patient is showing no signs of being hemodynamically unstable.  - Anemia panel prior to discharge with outpatient follow-up - Patient should be given a prescription for iron supplementation to be started after resolution of pneumonia  Asthma/COPD exacerbation: Mild. Likely secondary to infectious process/pneumonia as noted above. Much improved after 2 nebulizer treatments. - Solu-Medrol 60 mg 1 ordered to be given prior to discharge - Albuterol nebulizer or MDI every 4-6 hours 24 hours then as needed thereafter. - Prednisone 40 mg every morning with breakfast 4 days  Diabetes: Patient made aware of the fact that steroids will likely increase her blood glucose level.  - Continue home glipizide and metformin - She is to continue with her steroids unless her glucose gets above 300.  I have discussed the treatment plan above at length with the patient, patient's twin sister, and patient's cousin. Patient was able to teach back to me the treatment plan. Patient assures me that she is able to take care of herself at home.   Thank you for this consultation.  Our [facility] hospitalist team will follow the patient with you.   MERRELL, DAVID J M.D. [facility] 07/18/2017, 3:00 PM

## 2017-07-18 NOTE — ED Notes (Signed)
Dr. Marily Memos at bedside stated that he is going to send patient home after IV abx are finished

## 2017-07-23 LAB — CULTURE, BLOOD (ROUTINE X 2)
Culture: NO GROWTH
Culture: NO GROWTH
Special Requests: ADEQUATE
Special Requests: ADEQUATE

## 2017-07-24 ENCOUNTER — Telehealth: Payer: Self-pay | Admitting: Internal Medicine

## 2017-07-24 NOTE — Telephone Encounter (Signed)
Spoke with patient and scheduled for an appt with Krisitin next week.

## 2017-07-31 DIAGNOSIS — J181 Lobar pneumonia, unspecified organism: Secondary | ICD-10-CM | POA: Diagnosis not present

## 2017-07-31 DIAGNOSIS — E559 Vitamin D deficiency, unspecified: Secondary | ICD-10-CM | POA: Diagnosis not present

## 2017-07-31 DIAGNOSIS — D638 Anemia in other chronic diseases classified elsewhere: Secondary | ICD-10-CM | POA: Diagnosis not present

## 2017-07-31 DIAGNOSIS — J441 Chronic obstructive pulmonary disease with (acute) exacerbation: Secondary | ICD-10-CM | POA: Diagnosis not present

## 2017-07-31 DIAGNOSIS — E113299 Type 2 diabetes mellitus with mild nonproliferative diabetic retinopathy without macular edema, unspecified eye: Secondary | ICD-10-CM | POA: Diagnosis not present

## 2017-07-31 DIAGNOSIS — I119 Hypertensive heart disease without heart failure: Secondary | ICD-10-CM | POA: Diagnosis not present

## 2017-07-31 DIAGNOSIS — E1165 Type 2 diabetes mellitus with hyperglycemia: Secondary | ICD-10-CM | POA: Diagnosis not present

## 2017-07-31 DIAGNOSIS — E7800 Pure hypercholesterolemia, unspecified: Secondary | ICD-10-CM | POA: Diagnosis not present

## 2017-07-31 DIAGNOSIS — R2240 Localized swelling, mass and lump, unspecified lower limb: Secondary | ICD-10-CM | POA: Diagnosis not present

## 2017-07-31 DIAGNOSIS — M7989 Other specified soft tissue disorders: Secondary | ICD-10-CM | POA: Diagnosis not present

## 2017-07-31 DIAGNOSIS — J4520 Mild intermittent asthma, uncomplicated: Secondary | ICD-10-CM | POA: Diagnosis not present

## 2017-07-31 DIAGNOSIS — C3491 Malignant neoplasm of unspecified part of right bronchus or lung: Secondary | ICD-10-CM | POA: Diagnosis not present

## 2017-07-31 DIAGNOSIS — Z79899 Other long term (current) drug therapy: Secondary | ICD-10-CM | POA: Diagnosis not present

## 2017-08-01 ENCOUNTER — Telehealth: Payer: Self-pay | Admitting: *Deleted

## 2017-08-01 ENCOUNTER — Other Ambulatory Visit (HOSPITAL_BASED_OUTPATIENT_CLINIC_OR_DEPARTMENT_OTHER): Payer: Medicare Other

## 2017-08-01 ENCOUNTER — Other Ambulatory Visit: Payer: Self-pay | Admitting: Medical Oncology

## 2017-08-01 ENCOUNTER — Ambulatory Visit (HOSPITAL_COMMUNITY)
Admission: RE | Admit: 2017-08-01 | Discharge: 2017-08-01 | Disposition: A | Discharge status: Home / Self Care | Payer: Medicare Other | Source: Ambulatory Visit | Attending: Pulmonary Disease | Admitting: Pulmonary Disease

## 2017-08-01 ENCOUNTER — Encounter: Payer: Self-pay | Admitting: Oncology

## 2017-08-01 ENCOUNTER — Other Ambulatory Visit (HOSPITAL_COMMUNITY): Payer: Self-pay | Admitting: Pulmonary Disease

## 2017-08-01 ENCOUNTER — Ambulatory Visit (HOSPITAL_BASED_OUTPATIENT_CLINIC_OR_DEPARTMENT_OTHER): Payer: Medicare Other | Admitting: Oncology

## 2017-08-01 VITALS — BP 116/38 | HR 66 | Temp 98.3°F | Resp 17 | Wt 146.9 lb

## 2017-08-01 DIAGNOSIS — J1100 Influenza due to unidentified influenza virus with unspecified type of pneumonia: Secondary | ICD-10-CM

## 2017-08-01 DIAGNOSIS — R0602 Shortness of breath: Secondary | ICD-10-CM | POA: Diagnosis not present

## 2017-08-01 DIAGNOSIS — C3411 Malignant neoplasm of upper lobe, right bronchus or lung: Secondary | ICD-10-CM

## 2017-08-01 DIAGNOSIS — C3491 Malignant neoplasm of unspecified part of right bronchus or lung: Secondary | ICD-10-CM

## 2017-08-01 DIAGNOSIS — D509 Iron deficiency anemia, unspecified: Secondary | ICD-10-CM

## 2017-08-01 DIAGNOSIS — J189 Pneumonia, unspecified organism: Secondary | ICD-10-CM | POA: Insufficient documentation

## 2017-08-01 DIAGNOSIS — I700 Atherosclerosis of aorta: Secondary | ICD-10-CM | POA: Insufficient documentation

## 2017-08-01 LAB — COMPREHENSIVE METABOLIC PANEL
ALBUMIN: 2 g/dL — AB (ref 3.5–5.0)
ALK PHOS: 77 U/L (ref 40–150)
ALT: 63 U/L — ABNORMAL HIGH (ref 0–55)
ANION GAP: 7 meq/L (ref 3–11)
AST: 51 U/L — ABNORMAL HIGH (ref 5–34)
BILIRUBIN TOTAL: 0.4 mg/dL (ref 0.20–1.20)
BUN: 10.4 mg/dL (ref 7.0–26.0)
CALCIUM: 9.1 mg/dL (ref 8.4–10.4)
CO2: 27 mEq/L (ref 22–29)
Chloride: 99 mEq/L (ref 98–109)
Creatinine: 0.7 mg/dL (ref 0.6–1.1)
EGFR: 89 mL/min/{1.73_m2} — AB (ref >90)
Glucose: 155 mg/dl — ABNORMAL HIGH (ref 70–140)
Potassium: 4.1 mEq/L (ref 3.5–5.1)
Sodium: 133 mEq/L — ABNORMAL LOW (ref 136–145)
TOTAL PROTEIN: 6 g/dL — AB (ref 6.4–8.3)

## 2017-08-01 LAB — CBC WITH DIFFERENTIAL/PLATELET
BASO%: 0.2 % (ref 0.0–2.0)
Basophils Absolute: 0 10*3/uL (ref 0.0–0.1)
EOS ABS: 2.9 10*3/uL — AB (ref 0.0–0.5)
EOS%: 23.2 % — ABNORMAL HIGH (ref 0.0–7.0)
HCT: 26.4 % — ABNORMAL LOW (ref 34.8–46.6)
HGB: 8.2 g/dL — ABNORMAL LOW (ref 11.6–15.9)
LYMPH%: 4.5 % — AB (ref 14.0–49.7)
MCH: 24.4 pg — ABNORMAL LOW (ref 25.1–34.0)
MCHC: 31.2 g/dL — ABNORMAL LOW (ref 31.5–36.0)
MCV: 78.2 fL — AB (ref 79.5–101.0)
MONO#: 1.3 10*3/uL — AB (ref 0.1–0.9)
MONO%: 10.8 % (ref 0.0–14.0)
NEUT%: 61.3 % (ref 38.4–76.8)
NEUTROS ABS: 7.6 10*3/uL — AB (ref 1.5–6.5)
PLATELETS: 366 10*3/uL (ref 145–400)
RBC: 3.37 10*6/uL — ABNORMAL LOW (ref 3.70–5.45)
RDW: 15.6 % — ABNORMAL HIGH (ref 11.2–14.5)
WBC: 12.3 10*3/uL — AB (ref 3.9–10.3)
lymph#: 0.6 10*3/uL — ABNORMAL LOW (ref 0.9–3.3)

## 2017-08-01 NOTE — Assessment & Plan Note (Signed)
Hemoglobin today is 8.2. MCV is low at 78.2. The patient was instructed to increase her ferrous sulfate twice a day. We will follow-up on CBC at her next visit.

## 2017-08-01 NOTE — Assessment & Plan Note (Addendum)
This is a very pleasant 83 year old stage IA non-small cell lung cancer presented with right upper lobe pulmonary nodule status post curative stereotactic body radiotherapy. The patient is doing fine and tolerated her previous treatment well. She had repeat CT scan of the chest performed recently. The scan showed new right upper lobe pulmonary nodule suspicious for bronchogenic carcinoma.  I recommended for the patient to see Dr. Sondra Come for reevaluation and consideration of stereotactic radiotherapy if needed.   Patient was seen with Dr. Julien Nordmann. PET scan results were reviewed with the patient. Explained that there is a new right upper lobe pulmonary nodule seen which could represent a malignancy. The patient was reviewed in the multidisciplinary thoracic clinic in early August and the plan is to have a CT super D in early October 2018. The patient will need to be seen by cardiothoracic surgery thereafter for possible biopsy.   Thoracic lung navigator is making arrangements for the patient to have the CT scan performed at a follow-up with cardiothoracic surgery. The patient will follow back up in this office in approximately 3 months. I will see her back for follow-up visit in 6 months for reevaluation with repeat CT scan of the chest for restaging of her disease. She was advised to call immediately if she has any concerning symptoms in the interval.

## 2017-08-01 NOTE — Telephone Encounter (Signed)
Oncology Nurse Navigator Documentation  Oncology Nurse Navigator Flowsheets 08/01/2017  Navigator Location [facility]  Navigator Encounter Type Telephone;Other/per Dr. Julien Nordmann, I followed up on Erin Carr CT scan.  I contacted authorization coordinator to authorized. This has been completed. I then contacted central scheduling and was given a date and time for appt. I contacted patient and updated her on appt time, place, and pre-procedure instructions.  She verbalized understanding of appt.  I also contacted [facility] office to see when Dr. Lawson Fiscal will see patient. TCTS will follow up with Dr. Darcey Nora and call and schedule patient to be seen.   Patient Visit Type MedOnc  Treatment Phase Follow-up  Barriers/Navigation Needs Coordination of Care;Education  Education Other  Interventions Coordination of Care;Education  Coordination of Care Appts  Education Method Verbal  Acuity Level 2  Time Spent with Patient 9

## 2017-08-01 NOTE — Progress Notes (Signed)
[facility] Follow up:    Erin Liberty, MD [place] [address]   DIAGNOSIS: Cancer Staging No matching staging information was found for the patient. Stage IA(T1b, N0, M0) non-small cell lung cancer, adenocarcinoma presented with right upper lobe lung nodule diagnosed in November 2017.  SUMMARY OF ONCOLOGIC HISTORY: Oncology History   Patient followed due to HX of pulmonary nodules.      Adenocarcinoma of right lung, stage 1 (HCC)   09/27/2016 Imaging    CT Chest Stable spiculated lesion RIGHT upper lobe.       10/31/2016 Procedure     CT-guided core biopsy performed of a right upper lobe lung lesion      11/01/2016 Pathology Results    Lung, needle/core biopsy(ies), Right Upper Lobe ADENOCARCINOMA       11/16/2016 Initial Diagnosis    Adenocarcinoma of right lung, stage 1 ([place])     12/01/2016 Imaging    PET No significant change in mildly hypermetabolic ground-glass nodule in the RIGHT upper lobe.      12/08/2016 -  Radiation Therapy    SIM       PRIOR THERAPY: Curative stereotactic body radiotherapy to the right upper lobe lung nodule under the care of Dr. Sondra Come completed on 12/27/2016.  CURRENT THERAPY: Observation  INTERVAL HISTORY: Erin Carr 83 y.o. female returns for I will visit accompanied by her sister. The patient was recently evaluated in the emergency room and was diagnosed with pneumonia. She was placed on oral Levaquin and steroids. She states that she has completed the steroids but still has Levaquin to complete. She feels tired. Denies any fevers or chills. She denies having chest pain, shortness of breath, cough, hemoptysis. Denies nausea, vomiting, diarrhea, constipation. She has no significant weight loss or night sweats. Reports lower extremity edema and has been seen by her primary care provider for this. States that she is due to pick up a new prescription for her lower extremity edema  later today. She also has a repeat chest x-ray to be completed per her PCP later today. The patient is here for evaluation of her recent pneumonia and to discuss PET scan results.   Patient Active Problem List   Diagnosis Date Noted  . Microcytic anemia 07/18/2017  . CAP (community acquired pneumonia) 07/18/2017  . Asthma, chronic, unspecified asthma severity, with acute exacerbation 07/18/2017  . Type 2 diabetes mellitus without complication ([place]) [id number]  . Adenocarcinoma of right lung, stage 1 ([place]) 11/16/2016  . Neoplasm of uncertain behavior of right upper lobe of lung 12/01/2015  . Positive PPD   . Pelvic prolapse 12/16/2014    has No Known Allergies.  MEDICAL HISTORY: Past Medical History:  Diagnosis Date  . Adenocarcinoma of right lung, stage 1 ([place]) 11/16/2016  . Arthritis   . Asthma   . COPD (chronic obstructive pulmonary disease) ([place])   . Diabetes mellitus without complication ([place])    Type II  . Family history of adverse reaction to anesthesia    "aunt had PONV"  . History of radiation therapy 12/19/16-12/27/16   SBRT to right upper lobe 54 Gy in 3 fractions  . Hypercholesteremia   . Hypertension   . Osteoporosis   . Positive PPD 12/16   PER PCP  . Shortness of breath dyspnea   . Tremors of nervous system    Left hand  . Urinary frequency   . Vitamin D deficiency   . Wears glasses  SURGICAL HISTORY: Past Surgical History:  Procedure Laterality Date  . ABDOMINAL HYSTERECTOMY    . ANTERIOR AND POSTERIOR REPAIR N/A 12/16/2014   Procedure: ANTERIOR (CYSTOCELE) AND POSTERIOR REPAIR (RECTOCELE);  Surgeon: Crawford Givens, MD;  Location: [facility];  Service: Gynecology;  Laterality: N/A;  . APPENDECTOMY    . BOWEL RESECTION    . BREAST BIOPSY Left   . COLONOSCOPY    . VIDEO BRONCHOSCOPY WITH ENDOBRONCHIAL ULTRASOUND N/A 12/17/2015   Procedure: VIDEO BRONCHOSCOPY WITH ENDOBRONCHIAL ULTRASOUND;  Surgeon: Ivin Poot, MD;  Location: [facility];  Service: Thoracic;   Laterality: N/A;    SOCIAL HISTORY: Social History   Social History  . Marital status: Single    Spouse name: N/A  . Number of children: N/A  . Years of education: N/A   Occupational History  . Not on file.   Social History Main Topics  . Smoking status: Former Smoker    Packs/day: 0.50    Years: 40.00    Types: Cigarettes    Quit date: 12/08/2001  . Smokeless tobacco: Never Used     Comment: quit about 15-16 years ago; 12/09/2015  . Alcohol use No  . Drug use: No  . Sexual activity: Not on file   Other Topics Concern  . Not on file   Social History Narrative  . No narrative on file    FAMILY HISTORY: Family History  Problem Relation Age of Onset  . Hypertension Father   . Diabetes Father     Review of Systems  Constitutional: Positive for fatigue. Negative for appetite change, fever and unexpected weight change.  HENT:  Negative.   Eyes: Negative.   Respiratory: Negative for chest tightness, cough, hemoptysis and shortness of breath.   Cardiovascular: Positive for leg swelling. Negative for chest pain and palpitations.  Gastrointestinal: Negative.   Genitourinary: Negative.    Musculoskeletal: Negative.   Skin: Negative.   Neurological: Negative.   Hematological: Negative.   Psychiatric/Behavioral: Negative.       PHYSICAL EXAMINATION  ECOG PERFORMANCE STATUS: 1 - Symptomatic but completely ambulatory  Vitals:   08/01/17 1026  BP: (!) 116/38  Pulse: 66  Resp: 17  Temp: 98.3 F (36.8 C)  SpO2: 96%    Physical Exam  Constitutional: She is oriented to person, place, and time and well-developed, well-nourished, and in no distress. No distress.  HENT:  Head: Normocephalic.  Mouth/Throat: Oropharynx is clear and moist. No oropharyngeal exudate.  Eyes: Conjunctivae are normal. Right eye exhibits no discharge. Left eye exhibits no discharge. No scleral icterus.  Neck: Normal range of motion. Neck supple.  Cardiovascular: Normal rate, regular rhythm,  normal heart sounds and intact distal pulses.   Pulmonary/Chest: Breath sounds normal. No respiratory distress. She has no wheezes. She has no rales.  Abdominal: Soft. Bowel sounds are normal. She exhibits no distension and no mass. There is no tenderness.  Musculoskeletal: Normal range of motion. She exhibits edema.  2+ edema to the bilateral lower extremities.  Lymphadenopathy:    She has no cervical adenopathy.  Neurological: She is alert and oriented to person, place, and time. She exhibits normal muscle tone.  Skin: Skin is warm and dry. No rash noted. She is not diaphoretic. No erythema. No pallor.  Psychiatric: Mood, memory, affect and judgment normal.  Vitals reviewed.   LABORATORY DATA:  CBC    Component Value Date/Time   WBC 12.3 (H) 08/01/2017 1005   WBC 14.9 (H) 07/18/2017 1153   RBC 3.37 (L)  08/01/2017 1005   RBC 2.90 (L) 07/18/2017 1613   RBC 3.54 (L) 07/18/2017 1153   HGB 8.2 (L) 08/01/2017 1005   HCT 26.4 (L) 08/01/2017 1005   PLT 366 08/01/2017 1005   MCV 78.2 (L) 08/01/2017 1005   MCH 24.4 (L) 08/01/2017 1005   MCH 24.6 (L) 07/18/2017 1153   MCHC 31.2 (L) 08/01/2017 1005   MCHC 31.1 07/18/2017 1153   RDW 15.6 (H) 08/01/2017 1005   LYMPHSABS 0.6 (L) 08/01/2017 1005   MONOABS 1.3 (H) 08/01/2017 1005   EOSABS 2.9 (H) 08/01/2017 1005   BASOSABS 0.0 08/01/2017 1005    CMP     Component Value Date/Time   NA 133 (L) 08/01/2017 1005   K 4.1 08/01/2017 1005   CL 101 07/18/2017 1153   CO2 27 08/01/2017 1005   GLUCOSE 155 (H) 08/01/2017 1005   BUN 10.4 08/01/2017 1005   CREATININE 0.7 08/01/2017 1005   CALCIUM 9.1 08/01/2017 1005   PROT 6.0 (L) 08/01/2017 1005   ALBUMIN 2.0 (L) 08/01/2017 1005   AST 51 (H) 08/01/2017 1005   ALT 63 (H) 08/01/2017 1005   ALKPHOS 77 08/01/2017 1005   BILITOT 0.40 08/01/2017 1005   GFRNONAA >60 07/18/2017 1153   GFRAA >60 07/18/2017 1153    RADIOGRAPHIC STUDIES:  EXAM: NUCLEAR MEDICINE PET SKULL BASE TO  THIGH  TECHNIQUE: 7.1 mCi F-18 FDG was injected intravenously. Full-ring PET imaging was performed from the skull base to thigh after the radiotracer. CT data was obtained and used for attenuation correction and anatomic localization.  FASTING BLOOD GLUCOSE:  Value: 88 mg/dl  COMPARISON:  Multiple exams, including 12/01/2016  FINDINGS: NECK  No hypermetabolic lymph nodes in the neck.  Mild chronic ethmoid sinusitis. Carotid atherosclerotic calcification.  Hypermetabolic activity in the left lobe of the thyroid gland, maximum SUV 6.9, previously 6.3.  CHEST  The new right upper lobe pulmonary nodule seen on the CT scan of 05/16/2017 has a maximum SUV of 2.7.  In the area of the prior posterior right upper lobe nodule, and in the adjacent superior segment right lower lobe, there is extensive hypermetabolic airspace opacity with maximum SUV 6.5. Previous opacities in this region include a 2.1 cm right upper lobe lesion with a maximum SUV of 1.7, and a rounded airspace opacity in the right lower lobe measuring 3.3 by 3.1 cm on 05/16/2017. I am suspicious of much to current airspace opacity in hypermetabolic activity is related to radiation pneumonitis.  Focal right hilar accentuated metabolic activity has a maximum SUV of 4.3 (previously 3.9) a right paratracheal lymph node measuring 0.8 cm in short axis on image 51/4 has maximum standard uptake value of 4.7 (formerly 4.4).  Subsolid nodularity medially in the left lower lobe is indistinct and not hypermetabolic today, measuring about 0.7 by 0.5 cm.  Coronary, aortic arch, and branch vessel atherosclerotic vascular disease.  ABDOMEN/PELVIS  No abnormal hypermetabolic activity within the liver, pancreas, adrenal glands, or spleen. No hypermetabolic lymph nodes in the abdomen or pelvis.  Considerable physiologic activity in the colon. Prominent stool throughout the colon favors constipation. Aortoiliac  atherosclerotic vascular disease. Photopenic right kidney upper pole cyst.  Supraumbilical rectus diastasis.  Probable pelvic floor laxity.  Focal anal activity similar to prior, likely physiologic, maximum SUV 8.3, previously 9.7.  SKELETON  No focal hypermetabolic activity to suggest skeletal metastasis.  IMPRESSION: 1. The new right upper lobe pulmonary nodule seen on the recent CT scan from 05/16/2017 has a maximum SUV of 2.7,  and may well represent low-grade malignancy. 2. Other prior opacities posteriorly in the right upper lobe and in the right lower lobe are obscured by surrounding high signal intensity airspace opacity, probably from radiation pneumonitis. 3. The small sub solid nodule medially in the left lower lobe appears essentially stable and not hypermetabolic today. 4. Faintly hypermetabolic right hilar in right paratracheal lymph nodes probably reflect malignant involvement. 5. Hypermetabolic activity in the left lobe of the thyroid gland. Any significant minority of cases this can indicate thyroid cancer. Correlate with prior workup history. 6. Focal anal activity similar to prior and probably physiologic. 7. Other imaging findings of potential clinical significance: Aortic Atherosclerosis (ICD10-I70.0). Coronary atherosclerosis. Mild chronic ethmoid sinusitis. Supraumbilical rectus diastases. Probable pelvic floor laxity. Prominent stool throughout the colon favors constipation.   Electronically Signed   By: Van Clines M.D.   On: 06/26/2017 10:59  ASSESSMENT and THERAPY PLAN:   Adenocarcinoma of right lung, stage 1 (HCC) This is a very pleasant 83 year old stage IA non-small cell lung cancer presented with right upper lobe pulmonary nodule status post curative stereotactic body radiotherapy. The patient is doing fine and tolerated her previous treatment well. She had repeat CT scan of the chest performed recently. The scan showed new right  upper lobe pulmonary nodule suspicious for bronchogenic carcinoma.  I recommended for the patient to see Dr. Sondra Come for reevaluation and consideration of stereotactic radiotherapy if needed.   Patient was seen with Dr. Julien Nordmann. PET scan results were reviewed with the patient. Explained that there is a new right upper lobe pulmonary nodule seen which could represent a malignancy. The patient was reviewed in the multidisciplinary thoracic clinic in early August and the plan is to have a CT super D in early October 2018. The patient will need to be seen by cardiothoracic surgery thereafter for possible biopsy.   Thoracic lung navigator is making arrangements for the patient to have the CT scan performed at a follow-up with cardiothoracic surgery. The patient will follow back up in this office in approximately 3 months. I will see her back for follow-up visit in 6 months for reevaluation with repeat CT scan of the chest for restaging of her disease. She was advised to call immediately if she has any concerning symptoms in the interval.  Microcytic anemia Hemoglobin today is 8.2. MCV is low at 78.2. The patient was instructed to increase her ferrous sulfate twice a day. We will follow-up on CBC at her next visit.   No orders of the defined types were placed in this encounter.   All questions were answered. The patient knows to call the clinic with any problems, questions or concerns. We can certainly see the patient much sooner if necessary.  Mikey Bussing, NP 08/01/2017   ADDENDUM: Hematology/Oncology Attending: I had a face to face encounter with the patient. I recommended her care plan. This is a very pleasant 83 years old African-American female with history of stage IA non-small cell lung cancer presented with right upper lobe lung nodule status post curative SBRT under the care of Dr. Sondra Come. The patient had recent imaging studies that showed questionable disease recurrence with suspicious  new right upper lobe pulmonary nodule in addition to questionable mediastinal lymphadenopathy. We'll discuss the patient at the weekly thoracic conference for consideration of repeat biopsy and tissue diagnosis. It was recommended for the patient to have CT super D of the chest in 2 months and consider the patient for repeat bronchoscopy,EBUS, and navigational  bronchoscopy for tissue diagnosis. We will arrange for the patient has a scan done at that time followed by evaluation by cardiothoracic surgery, Dr. Prescott Gum or one of his partner for tissue diagnosis. We will see the patient back for follow-up visit at that time. For the persistent microcytic anemia, I recommended for the patient to continue her oral iron tablet and increase it to 2 tablets every day. The patient was advised to call immediately if she has any concerning symptoms in the interval. Disclaimer: This note was dictated with voice recognition software. Similar sounding words can inadvertently be transcribed and may be missed upon review. Eilleen Kempf, MD 08/03/17

## 2017-08-07 ENCOUNTER — Ambulatory Visit (HOSPITAL_COMMUNITY)
Admission: RE | Admit: 2017-08-07 | Discharge: 2017-08-07 | Disposition: A | Discharge status: Home / Self Care | Payer: Medicare Other | Source: Ambulatory Visit | Attending: Radiation Oncology | Admitting: Radiation Oncology

## 2017-08-07 DIAGNOSIS — Z923 Personal history of irradiation: Secondary | ICD-10-CM | POA: Insufficient documentation

## 2017-08-07 DIAGNOSIS — I700 Atherosclerosis of aorta: Secondary | ICD-10-CM | POA: Insufficient documentation

## 2017-08-07 DIAGNOSIS — J90 Pleural effusion, not elsewhere classified: Secondary | ICD-10-CM | POA: Diagnosis not present

## 2017-08-07 DIAGNOSIS — I2510 Atherosclerotic heart disease of native coronary artery without angina pectoris: Secondary | ICD-10-CM | POA: Diagnosis not present

## 2017-08-07 DIAGNOSIS — D381 Neoplasm of uncertain behavior of trachea, bronchus and lung: Secondary | ICD-10-CM | POA: Diagnosis not present

## 2017-08-07 MED ORDER — IOPAMIDOL (ISOVUE-300) INJECTION 61%
75.0000 mL | Freq: Once | INTRAVENOUS | Status: AC | PRN
  Administered 2017-08-07: 75 mL via INTRAVENOUS

## 2017-08-08 ENCOUNTER — Encounter: Payer: Self-pay | Admitting: Cardiothoracic Surgery

## 2017-08-08 ENCOUNTER — Other Ambulatory Visit: Payer: Self-pay | Admitting: *Deleted

## 2017-08-08 ENCOUNTER — Institutional Professional Consult (permissible substitution) (INDEPENDENT_AMBULATORY_CARE_PROVIDER_SITE_OTHER): Payer: Medicare Other | Admitting: Cardiothoracic Surgery

## 2017-08-08 VITALS — BP 124/41 | HR 68 | Resp 20 | Ht 61.0 in | Wt 135.0 lb

## 2017-08-08 DIAGNOSIS — C3411 Malignant neoplasm of upper lobe, right bronchus or lung: Secondary | ICD-10-CM

## 2017-08-08 DIAGNOSIS — Z923 Personal history of irradiation: Secondary | ICD-10-CM

## 2017-08-08 DIAGNOSIS — R590 Localized enlarged lymph nodes: Secondary | ICD-10-CM | POA: Diagnosis not present

## 2017-08-08 DIAGNOSIS — J90 Pleural effusion, not elsewhere classified: Secondary | ICD-10-CM

## 2017-08-08 DIAGNOSIS — D381 Neoplasm of uncertain behavior of trachea, bronchus and lung: Secondary | ICD-10-CM | POA: Diagnosis not present

## 2017-08-08 NOTE — Progress Notes (Signed)
PCP is Vincente Liberty, MD Referring Provider is Curt Bears, MD  Chief Complaint  Patient presents with  . Lung Cancer    right upper lobe treated with radiation  . Lung Lesion    NEW in the RULobe...SUPER-D CT CHEST 08/07/17...needs BX  Patient examined, super D CT  chest  scan performed today personally reviewed and discussed with patient  HPI: Patient returns for evaluation of possible new right upper lobe adenocarcinoma with a PET  Positive 1.8 cm nodule. The patient had a right upper lobe nodule biopsy-proven adenocarcinoma in late November 2017 and finished radiation with stereotactic body radiation before the end of December. The patient has developed shortness of breath which initially correlated with post radiation changes in the right lung but now has  progressively extended as documented on follow-up PET scan and chest CT now with involvement of  the upper and lower lobes with a moderate right pleural effusion. She also has interstitial disease of the left upper lobe. She was scheduled for a super D CT scan to assess the nodule for endobronchial biopsy. However the increasing interstitial disease completely obscured the right upper lobe nodule which was the site of concern.  The patient was seen in the emergency department in mid August with shortness of breath wheezing nonproductive cough. White count 14,000. She was felt to have pneumonia and was discharged home on prednisone and Levaquin. She was not sent home on home oxygen. No follow-up appointment was arranged. She was encouraged to see her PCP.  Since that time she has felt no better. She has developed significant bilateral pedal edema probably from prednisone. Her resting room air O2 sat is 92%. After walking her in the office hallway 100 feet it  dropped to 85%.. She's being arrange for home oxygen.    Past Medical History:  Diagnosis Date  . Adenocarcinoma of right lung, stage 1 ([place]) 11/16/2016  . Arthritis   .  Asthma   . COPD (chronic obstructive pulmonary disease) ([place])   . Diabetes mellitus without complication ([place])    Type II  . Family history of adverse reaction to anesthesia    "aunt had PONV"  . History of radiation therapy 12/19/16-12/27/16   SBRT to right upper lobe 54 Gy in 3 fractions  . Hypercholesteremia   . Hypertension   . Osteoporosis   . Positive PPD 12/16   PER PCP  . Shortness of breath dyspnea   . Tremors of nervous system    Left hand  . Urinary frequency   . Vitamin D deficiency   . Wears glasses     Past Surgical History:  Procedure Laterality Date  . ABDOMINAL HYSTERECTOMY    . ANTERIOR AND POSTERIOR REPAIR N/A 12/16/2014   Procedure: ANTERIOR (CYSTOCELE) AND POSTERIOR REPAIR (RECTOCELE);  Surgeon: Crawford Givens, MD;  Location: [facility];  Service: Gynecology;  Laterality: N/A;  . APPENDECTOMY    . BOWEL RESECTION    . BREAST BIOPSY Left   . COLONOSCOPY    . VIDEO BRONCHOSCOPY WITH ENDOBRONCHIAL ULTRASOUND N/A 12/17/2015   Procedure: VIDEO BRONCHOSCOPY WITH ENDOBRONCHIAL ULTRASOUND;  Surgeon: Ivin Poot, MD;  Location: [facility];  Service: Thoracic;  Laterality: N/A;    Family History  Problem Relation Age of Onset  . Hypertension Father   . Diabetes Father     Social History Social History  Substance Use Topics  . Smoking status: Former Smoker    Packs/day: 0.50    Years: 40.00    Types:  Cigarettes    Quit date: 12/08/2001  . Smokeless tobacco: Never Used     Comment: quit about 15-16 years ago; 12/09/2015  . Alcohol use No    Current Outpatient Prescriptions  Medication Sig Dispense Refill  . albuterol (PROVENTIL HFA;VENTOLIN HFA) 108 (90 Base) MCG/ACT inhaler Inhale 1 puff into the lungs every 4 (four) hours as needed for wheezing or shortness of breath.    Marland Kitchen amLODipine (NORVASC) 10 MG tablet Take 10 mg by mouth daily.    Marland Kitchen aspirin EC 81 MG tablet Take 81 mg by mouth daily.    . ergocalciferol (VITAMIN D2) 50000 units capsule Take 50,000 Units by  mouth once a week.    . ferrous sulfate 325 (65 FE) MG tablet Take 1 tablet (325 mg total) by mouth daily. 30 tablet 0  . furosemide (LASIX) 40 MG tablet Take 40 mg by mouth 2 (two) times daily.    Marland Kitchen gabapentin (NEURONTIN) 400 MG capsule Take 400 mg by mouth 2 (two) times daily.    Marland Kitchen glipiZIDE (GLUCOTROL) 5 MG tablet Take 5 mg by mouth daily before breakfast.    . labetalol (NORMODYNE) 100 MG tablet Take 100 mg by mouth 2 (two) times daily.    Marland Kitchen levofloxacin (LEVAQUIN) 500 MG tablet Take 1 tablet (500 mg total) by mouth daily. 7 tablet 0  . lisinopril (PRINIVIL,ZESTRIL) 20 MG tablet Take 20 mg by mouth 2 (two) times daily.    . metFORMIN (GLUCOPHAGE-XR) 500 MG 24 hr tablet Take 500 mg by mouth 2 (two) times daily.    . potassium chloride (K-DUR) 10 MEQ tablet Take 10 mEq by mouth daily.    . propranolol (INDERAL) 10 MG tablet Take 10 mg by mouth every 12 (twelve) hours.    . simvastatin (ZOCOR) 20 MG tablet Take 20 mg by mouth at bedtime.     . TRAVATAN Z 0.004 % SOLN ophthalmic solution Place 1 drop into both eyes at bedtime.     No current facility-administered medications for this visit.     No Known Allergies  Review of Systems  Poor appetite and probable weight loss Nonproductive cough No chest pain No palpitations  BP (!) 124/41 (BP Location: Right Arm, Patient Position: Sitting, Cuff Size: Large)   Pulse 68   Resp 20   Ht 5\' 1"  (1.549 m)   Wt 135 lb (61.2 kg)   SpO2 92% Comment: ON RA  BMI 25.51 kg/m  Physical Exam Breath sounds diminished on the right side Heart rate regular, no murmur or friction rub No JVD No adenopathy in the neck 3+ ankle edema bilaterally Abdominal exam nontender  Diagnostic Tests: CT scan shows severe pulmonary fibrosis versus lymphangitic cancer versus pneumonia with opacification of much of the right lung and obscuring the small right upper lobe nodule. This super D skin would not be adequate to facilitate a transbronchial  biopsy.  Impression: Progressive right lung disease of unclear etiology after stereotactic body radiation to the right upper lobe, now involving the left upper lobe as well  The patient de sats on room air and needs home oxygen which will be arranged She is not a candidate for bronchoscopy and general anesthesia She is finishing her course of oral Levaquin and has completed the prednisone Because of a moderate right pleural effusion which may be sympathetic to the pneumonitis versus malignancy an interventional radiology right thoracentesis will be set up to determine if she has a malignant effusion.   Plan: She will return  here in 2 weeks with chest x-ray to assess her pulmonary status. She is told she should again return to the emergency room for admission if she has more shortness of breath despite having home oxygen. Overall prognosis is not good.   Len Childs, MD [facility] [phone]

## 2017-08-09 ENCOUNTER — Encounter: Payer: Self-pay | Admitting: *Deleted

## 2017-08-09 DIAGNOSIS — J90 Pleural effusion, not elsewhere classified: Secondary | ICD-10-CM | POA: Insufficient documentation

## 2017-08-09 DIAGNOSIS — R0602 Shortness of breath: Secondary | ICD-10-CM | POA: Insufficient documentation

## 2017-08-10 NOTE — Progress Notes (Addendum)
Patient ID: Erin Carr, female   DOB: [birth year], 83 y.o.   MRN: [id number] HOME O2 QUALIFIERS...O2 Sat on RA @ rest 92%.Marland KitchenMarland KitchenO2 Sat on RA with exercise 85%..O2 Sat on 02 with exercise 95%

## 2017-08-17 ENCOUNTER — Ambulatory Visit (HOSPITAL_COMMUNITY)
Admission: RE | Admit: 2017-08-17 | Discharge: 2017-08-17 | Disposition: A | Discharge status: Home / Self Care | Payer: Medicare Other | Source: Ambulatory Visit | Attending: Cardiothoracic Surgery | Admitting: Cardiothoracic Surgery

## 2017-08-17 ENCOUNTER — Ambulatory Visit (HOSPITAL_COMMUNITY)
Admission: RE | Admit: 2017-08-17 | Discharge: 2017-08-17 | Disposition: A | Discharge status: Home / Self Care | Payer: Medicare Other | Source: Ambulatory Visit | Attending: Radiology | Admitting: Radiology

## 2017-08-17 DIAGNOSIS — R918 Other nonspecific abnormal finding of lung field: Secondary | ICD-10-CM | POA: Diagnosis not present

## 2017-08-17 DIAGNOSIS — Z9889 Other specified postprocedural states: Secondary | ICD-10-CM | POA: Diagnosis not present

## 2017-08-17 DIAGNOSIS — J90 Pleural effusion, not elsewhere classified: Secondary | ICD-10-CM | POA: Insufficient documentation

## 2017-08-17 DIAGNOSIS — R846 Abnormal cytological findings in specimens from respiratory organs and thorax: Secondary | ICD-10-CM | POA: Diagnosis not present

## 2017-08-17 MED ORDER — LIDOCAINE HCL 2 % IJ SOLN
INTRAMUSCULAR | Status: AC
  Filled 2017-08-17: qty 10

## 2017-08-17 NOTE — Procedures (Signed)
Ultrasound-guided diagnostic and therapeutic right thoracentesis performed yielding 750 cc of slightly hazy, yellow fluid. No immediate complications. Follow-up chest x-ray pending.The fluid was sent to the lab for cytology.

## 2017-08-21 ENCOUNTER — Other Ambulatory Visit: Payer: Self-pay | Admitting: Cardiothoracic Surgery

## 2017-08-21 DIAGNOSIS — J90 Pleural effusion, not elsewhere classified: Secondary | ICD-10-CM

## 2017-08-22 ENCOUNTER — Encounter: Payer: Self-pay | Admitting: Cardiothoracic Surgery

## 2017-08-22 ENCOUNTER — Ambulatory Visit (INDEPENDENT_AMBULATORY_CARE_PROVIDER_SITE_OTHER): Payer: Medicare Other | Admitting: Cardiothoracic Surgery

## 2017-08-22 ENCOUNTER — Ambulatory Visit
Admission: RE | Admit: 2017-08-22 | Discharge: 2017-08-22 | Disposition: A | Discharge status: Home / Self Care | Payer: Medicare Other | Source: Ambulatory Visit | Attending: Cardiothoracic Surgery | Admitting: Cardiothoracic Surgery

## 2017-08-22 VITALS — BP 95/39 | HR 63 | Resp 16 | Ht 61.0 in | Wt 135.0 lb

## 2017-08-22 DIAGNOSIS — J90 Pleural effusion, not elsewhere classified: Secondary | ICD-10-CM

## 2017-08-22 DIAGNOSIS — C3411 Malignant neoplasm of upper lobe, right bronchus or lung: Secondary | ICD-10-CM | POA: Diagnosis not present

## 2017-08-22 DIAGNOSIS — R0602 Shortness of breath: Secondary | ICD-10-CM | POA: Diagnosis not present

## 2017-08-22 DIAGNOSIS — D381 Neoplasm of uncertain behavior of trachea, bronchus and lung: Secondary | ICD-10-CM | POA: Diagnosis not present

## 2017-08-22 DIAGNOSIS — Z923 Personal history of irradiation: Secondary | ICD-10-CM | POA: Diagnosis not present

## 2017-08-22 NOTE — Progress Notes (Signed)
PCP is Vincente Liberty, MD Referring Provider is Curt Bears, MD  Chief Complaint  Patient presents with  . Follow-up    2 wk with CXR    YBO:FBPZW 83 year old female returns with chest x-ray after right thoracentesis for return of 750 cc of clear xanthochromic fluid with negative cytology for malignancy. The patient had stereotactic body radiation therapy of a right upper lobe adenocarcinoma which finished in December 2017. A surveillance CT scan of the chest performed by her radiation oncologist in spring 2018 showed a new infiltrated density in the right lung. A super D CT scan was recommended in 3 months after that for possible navigational  bronchoscopy biopsy  -  however the  pulmonary infiltrative process became more dense and widespread and obscured the original density to be biopsied by bronchoscopy. This had a morphology of radiation pneumonitis and she was treated with both antibiotics and prednisone. On her last office visit I did  not feel she was a candidate for general anesthesia and bronchoscopy so a thoracentesis was performed of a mild-moderate right pleural effusion. This returned negative for malignancy. Currently her symptoms of shortness of breath have improved. She was started on home oxygen therapy because of desaturation with activity but she is using less  oxygen at home. Chest x-ray done today compared to her last visit shows some clearing of the interstitial pulmonary parenchymal disease. 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 She presents today in a wheelchair very fragile and not a candidate for general anesthesia and bronchoscopy.  The plan is to follow the inflammatory pulmonary changes with x-ray and observational management.  Past Medical History:  Diagnosis Date  . Adenocarcinoma of right lung, stage 1 ([place]) 11/16/2016  . Arthritis   . Asthma   . COPD (chronic obstructive pulmonary disease) ([place])   . Diabetes mellitus without  complication ([place])    Type II  . Family history of adverse reaction to anesthesia    "aunt had PONV"  . History of radiation therapy 12/19/16-12/27/16   SBRT to right upper lobe 54 Gy in 3 fractions  . Hypercholesteremia   . Hypertension   . Osteoporosis   . Positive PPD 12/16   PER PCP  . Shortness of breath dyspnea   . Tremors of nervous system    Left hand  . Urinary frequency   . Vitamin D deficiency   . Wears glasses     Past Surgical History:  Procedure Laterality Date  . ABDOMINAL HYSTERECTOMY    . ANTERIOR AND POSTERIOR REPAIR N/A 12/16/2014   Procedure: ANTERIOR (CYSTOCELE) AND POSTERIOR REPAIR (RECTOCELE);  Surgeon: Crawford Givens, MD;  Location: [facility];  Service: Gynecology;  Laterality: N/A;  . APPENDECTOMY    . BOWEL RESECTION    . BREAST BIOPSY Left   . COLONOSCOPY    . VIDEO BRONCHOSCOPY WITH ENDOBRONCHIAL ULTRASOUND N/A 12/17/2015   Procedure: VIDEO BRONCHOSCOPY WITH ENDOBRONCHIAL ULTRASOUND;  Surgeon: Ivin Poot, MD;  Location: [facility] OR;  Service: Thoracic;  Laterality: N/A;    Family History  Problem Relation Age of Onset  . Hypertension Father   . Diabetes Father     Social History Social History  Substance Use Topics  . Smoking status: Former Smoker    Packs/day: 0.50    Years: 40.00    Types: Cigarettes    Quit date: 12/08/2001  . Smokeless tobacco: Never Used     Comment: quit about 15-16 years ago; 12/09/2015  . Alcohol use No  Current Outpatient Prescriptions  Medication Sig Dispense Refill  . albuterol (PROVENTIL HFA;VENTOLIN HFA) 108 (90 Base) MCG/ACT inhaler Inhale 1 puff into the lungs every 4 (four) hours as needed for wheezing or shortness of breath.    Marland Kitchen amLODipine (NORVASC) 10 MG tablet Take 10 mg by mouth daily.    Marland Kitchen aspirin EC 81 MG tablet Take 81 mg by mouth daily.    . ergocalciferol (VITAMIN D2) 50000 units capsule Take 50,000 Units by mouth once a week.    . ferrous sulfate 325 (65 FE) MG tablet Take 1 tablet (325 mg total) by  mouth daily. 30 tablet 0  . furosemide (LASIX) 40 MG tablet Take 40 mg by mouth 2 (two) times daily.    Marland Kitchen gabapentin (NEURONTIN) 400 MG capsule Take 400 mg by mouth 2 (two) times daily.    Marland Kitchen glipiZIDE (GLUCOTROL) 5 MG tablet Take 5 mg by mouth daily before breakfast.    . labetalol (NORMODYNE) 100 MG tablet Take 100 mg by mouth 2 (two) times daily.    Marland Kitchen lisinopril (PRINIVIL,ZESTRIL) 20 MG tablet Take 20 mg by mouth 2 (two) times daily.    . metFORMIN (GLUCOPHAGE-XR) 500 MG 24 hr tablet Take 500 mg by mouth 2 (two) times daily.    . potassium chloride (K-DUR) 10 MEQ tablet Take 10 mEq by mouth daily.    . propranolol (INDERAL) 10 MG tablet Take 10 mg by mouth every 12 (twelve) hours.    . simvastatin (ZOCOR) 20 MG tablet Take 20 mg by mouth at bedtime.     . TRAVATAN Z 0.004 % SOLN ophthalmic solution Place 1 drop into both eyes at bedtime.     No current facility-administered medications for this visit.     No Known Allergies  Review of Systems Weight stable Pedal edema improved Breathing improved No fever No hemoptysis  BP (!) 95/39 (BP Location: Right Arm, Patient Position: Sitting, Cuff Size: Normal)   Pulse 63   Resp 16   Ht 5\' 1"  (1.549 m)   Wt 135 lb (61.2 kg)   SpO2 94% Comment: ON RA...uses O2 continuously at home  BMI 25.51 kg/m  Physical Exam      Exam    General- alert and comfortable elderly fragile AA female in a wheelchair   Lungs- clear without rales, wheezes   Cor- regular rate and rhythm, no murmur , gallop   Abdomen- soft, non-tender   Extremities - warm, non-tender, minimal edema   Neuro- oriented, appropriate, no focal weakness   Diagnostic Tests: Thoracentesis 750 cc clear yellow fluid negative for malignancy Chest x-ray with improving aeration of the right lung inflammatory process Impression: Improving radiation pneumonitis Continue home oxygen as needed  Plan: Observational therapy and surveillance chest x-ray in 6 weeks.   Len Childs, MD [facility] [phone]

## 2017-09-17 DIAGNOSIS — Z23 Encounter for immunization: Secondary | ICD-10-CM | POA: Diagnosis not present

## 2017-09-18 DIAGNOSIS — E113299 Type 2 diabetes mellitus with mild nonproliferative diabetic retinopathy without macular edema, unspecified eye: Secondary | ICD-10-CM | POA: Diagnosis not present

## 2017-09-18 DIAGNOSIS — E7800 Pure hypercholesterolemia, unspecified: Secondary | ICD-10-CM | POA: Diagnosis not present

## 2017-09-18 DIAGNOSIS — Z23 Encounter for immunization: Secondary | ICD-10-CM | POA: Diagnosis not present

## 2017-09-18 DIAGNOSIS — E1165 Type 2 diabetes mellitus with hyperglycemia: Secondary | ICD-10-CM | POA: Diagnosis not present

## 2017-09-18 DIAGNOSIS — Z79899 Other long term (current) drug therapy: Secondary | ICD-10-CM | POA: Diagnosis not present

## 2017-09-18 DIAGNOSIS — D638 Anemia in other chronic diseases classified elsewhere: Secondary | ICD-10-CM | POA: Diagnosis not present

## 2017-09-18 DIAGNOSIS — J4520 Mild intermittent asthma, uncomplicated: Secondary | ICD-10-CM | POA: Diagnosis not present

## 2017-09-18 DIAGNOSIS — R0602 Shortness of breath: Secondary | ICD-10-CM | POA: Diagnosis not present

## 2017-09-18 DIAGNOSIS — C3491 Malignant neoplasm of unspecified part of right bronchus or lung: Secondary | ICD-10-CM | POA: Diagnosis not present

## 2017-09-18 DIAGNOSIS — J449 Chronic obstructive pulmonary disease, unspecified: Secondary | ICD-10-CM | POA: Diagnosis not present

## 2017-09-19 DIAGNOSIS — E113293 Type 2 diabetes mellitus with mild nonproliferative diabetic retinopathy without macular edema, bilateral: Secondary | ICD-10-CM | POA: Diagnosis not present

## 2017-09-19 DIAGNOSIS — H2513 Age-related nuclear cataract, bilateral: Secondary | ICD-10-CM | POA: Diagnosis not present

## 2017-09-19 DIAGNOSIS — H401131 Primary open-angle glaucoma, bilateral, mild stage: Secondary | ICD-10-CM | POA: Diagnosis not present

## 2017-10-01 DIAGNOSIS — H25813 Combined forms of age-related cataract, bilateral: Secondary | ICD-10-CM | POA: Diagnosis not present

## 2017-10-01 DIAGNOSIS — H25811 Combined forms of age-related cataract, right eye: Secondary | ICD-10-CM | POA: Diagnosis not present

## 2017-10-01 DIAGNOSIS — H21561 Pupillary abnormality, right eye: Secondary | ICD-10-CM | POA: Diagnosis not present

## 2017-10-01 DIAGNOSIS — H21541 Posterior synechiae (iris), right eye: Secondary | ICD-10-CM | POA: Diagnosis not present

## 2017-10-01 DIAGNOSIS — H59211 Accidental puncture and laceration of right eye and adnexa during an ophthalmic procedure: Secondary | ICD-10-CM | POA: Diagnosis not present

## 2017-10-02 DIAGNOSIS — E113293 Type 2 diabetes mellitus with mild nonproliferative diabetic retinopathy without macular edema, bilateral: Secondary | ICD-10-CM | POA: Diagnosis not present

## 2017-10-02 DIAGNOSIS — H25812 Combined forms of age-related cataract, left eye: Secondary | ICD-10-CM | POA: Diagnosis not present

## 2017-10-02 DIAGNOSIS — H401131 Primary open-angle glaucoma, bilateral, mild stage: Secondary | ICD-10-CM | POA: Diagnosis not present

## 2017-10-09 ENCOUNTER — Emergency Department (HOSPITAL_COMMUNITY)
Admission: EM | Admit: 2017-10-09 | Discharge: 2017-10-09 | Disposition: A | Discharge status: Home / Self Care | Payer: Medicare Other | Attending: Emergency Medicine | Admitting: Emergency Medicine

## 2017-10-09 ENCOUNTER — Emergency Department (HOSPITAL_COMMUNITY): Payer: Medicare Other

## 2017-10-09 ENCOUNTER — Other Ambulatory Visit: Payer: Self-pay | Admitting: Cardiothoracic Surgery

## 2017-10-09 ENCOUNTER — Encounter (HOSPITAL_COMMUNITY): Payer: Self-pay

## 2017-10-09 ENCOUNTER — Other Ambulatory Visit: Payer: Self-pay

## 2017-10-09 DIAGNOSIS — J449 Chronic obstructive pulmonary disease, unspecified: Secondary | ICD-10-CM | POA: Diagnosis not present

## 2017-10-09 DIAGNOSIS — Z7982 Long term (current) use of aspirin: Secondary | ICD-10-CM | POA: Insufficient documentation

## 2017-10-09 DIAGNOSIS — R600 Localized edema: Secondary | ICD-10-CM

## 2017-10-09 DIAGNOSIS — I10 Essential (primary) hypertension: Secondary | ICD-10-CM | POA: Diagnosis not present

## 2017-10-09 DIAGNOSIS — R05 Cough: Secondary | ICD-10-CM | POA: Diagnosis not present

## 2017-10-09 DIAGNOSIS — Z79899 Other long term (current) drug therapy: Secondary | ICD-10-CM | POA: Insufficient documentation

## 2017-10-09 DIAGNOSIS — D721 Eosinophilia: Secondary | ICD-10-CM | POA: Diagnosis not present

## 2017-10-09 DIAGNOSIS — Z87891 Personal history of nicotine dependence: Secondary | ICD-10-CM | POA: Diagnosis not present

## 2017-10-09 DIAGNOSIS — Z7984 Long term (current) use of oral hypoglycemic drugs: Secondary | ICD-10-CM | POA: Diagnosis not present

## 2017-10-09 DIAGNOSIS — E119 Type 2 diabetes mellitus without complications: Secondary | ICD-10-CM | POA: Diagnosis not present

## 2017-10-09 DIAGNOSIS — J45909 Unspecified asthma, uncomplicated: Secondary | ICD-10-CM | POA: Diagnosis not present

## 2017-10-09 DIAGNOSIS — R2243 Localized swelling, mass and lump, lower limb, bilateral: Secondary | ICD-10-CM | POA: Diagnosis present

## 2017-10-09 DIAGNOSIS — C3491 Malignant neoplasm of unspecified part of right bronchus or lung: Secondary | ICD-10-CM

## 2017-10-09 DIAGNOSIS — R9431 Abnormal electrocardiogram [ECG] [EKG]: Secondary | ICD-10-CM | POA: Diagnosis not present

## 2017-10-09 LAB — URINALYSIS, ROUTINE W REFLEX MICROSCOPIC
Bilirubin Urine: NEGATIVE
GLUCOSE, UA: NEGATIVE mg/dL
HGB URINE DIPSTICK: NEGATIVE
Ketones, ur: NEGATIVE mg/dL
Leukocytes, UA: NEGATIVE
Nitrite: NEGATIVE
PH: 8 (ref 5.0–8.0)
Protein, ur: NEGATIVE mg/dL
SPECIFIC GRAVITY, URINE: 1.005 (ref 1.005–1.030)

## 2017-10-09 LAB — CBC WITH DIFFERENTIAL/PLATELET
BASOS PCT: 0 %
Basophils Absolute: 0 10*3/uL (ref 0.0–0.1)
EOS PCT: 63 %
Eosinophils Absolute: 17 10*3/uL — ABNORMAL HIGH (ref 0.0–0.7)
HCT: 28 % — ABNORMAL LOW (ref 36.0–46.0)
HEMOGLOBIN: 8.4 g/dL — AB (ref 12.0–15.0)
LYMPHS PCT: 6 %
Lymphs Abs: 1.6 10*3/uL (ref 0.7–4.0)
MCH: 22.8 pg — AB (ref 26.0–34.0)
MCHC: 30 g/dL (ref 30.0–36.0)
MCV: 75.9 fL — AB (ref 78.0–100.0)
MONO ABS: 1.3 10*3/uL — AB (ref 0.1–1.0)
Monocytes Relative: 5 %
NEUTROS PCT: 26 %
Neutro Abs: 7 10*3/uL (ref 1.7–7.7)
Platelets: 354 10*3/uL (ref 150–400)
RBC: 3.69 MIL/uL — ABNORMAL LOW (ref 3.87–5.11)
RDW: 20.2 % — ABNORMAL HIGH (ref 11.5–15.5)
WBC: 26.9 10*3/uL — ABNORMAL HIGH (ref 4.0–10.5)

## 2017-10-09 LAB — COMPREHENSIVE METABOLIC PANEL
ALBUMIN: 2.3 g/dL — AB (ref 3.5–5.0)
ALT: 22 U/L (ref 14–54)
AST: 33 U/L (ref 15–41)
Alkaline Phosphatase: 132 U/L — ABNORMAL HIGH (ref 38–126)
Anion gap: 8 (ref 5–15)
BUN: 10 mg/dL (ref 6–20)
CHLORIDE: 101 mmol/L (ref 101–111)
CO2: 25 mmol/L (ref 22–32)
CREATININE: 0.89 mg/dL (ref 0.44–1.00)
Calcium: 8.7 mg/dL — ABNORMAL LOW (ref 8.9–10.3)
GFR calc non Af Amer: 57 mL/min — ABNORMAL LOW (ref >60)
Glucose, Bld: 111 mg/dL — ABNORMAL HIGH (ref 65–99)
Potassium: 4.1 mmol/L (ref 3.5–5.1)
SODIUM: 134 mmol/L — AB (ref 135–145)
Total Bilirubin: 0.7 mg/dL (ref 0.3–1.2)
Total Protein: 6 g/dL — ABNORMAL LOW (ref 6.5–8.1)

## 2017-10-09 LAB — BRAIN NATRIURETIC PEPTIDE: B Natriuretic Peptide: 308.2 pg/mL — ABNORMAL HIGH (ref 0.0–100.0)

## 2017-10-09 MED ORDER — FUROSEMIDE 20 MG PO TABS: 60.0000 mg | ORAL_TABLET | Freq: Two times a day (BID) | ORAL | 0 refills | Status: DC

## 2017-10-09 MED ORDER — FUROSEMIDE 20 MG PO TABS
40.0000 mg | ORAL_TABLET | Freq: Once | ORAL | Status: AC
  Administered 2017-10-09: 40 mg via ORAL
  Filled 2017-10-09: qty 2

## 2017-10-09 NOTE — Discharge Instructions (Signed)
I want you to increase your Lasix to 60 mg twice a day.  You will be taking 3 of the pills 2 times a day.  Continue to take all your other home medications. Follow-up with your primary care doctor in 3 days for further evaluation of your leg swelling. Return to the emergency room if you develop chest pain, difficulty breathing, fevers, or any new or worsening symptoms.

## 2017-10-09 NOTE — ED Notes (Signed)
ED Provider at bedside. 

## 2017-10-09 NOTE — ED Triage Notes (Signed)
Pt reports bilateral leg swelling that began on Sunday. She has pitting edema noted bilaterally. She denies hx of CHF. Pt denies chest pain or shortness of breath. No distress noted.

## 2017-10-09 NOTE — ED Provider Notes (Signed)
Planes of bilateral leg swelling for the past 3 weeks.  She denies any shortness of breath.  She does have a slight cough from recent pneumonia but she says cough is much improved.  No fever.  No other associated symptoms.  She treats herself with Lasix 40 mg twice daily.  No recent change in medicine.  On exam alert no distress HEENT exam normocephalic atraumatic neck is supple trachea midline JVD present, lungs clear to auscultation.  No respiratory distress abdomen nondistended nontender bilateral lower extremities with 1-2+ pretibial pitting edema bilaterally. Patient felt to be in mild congestive heart failure clinically.  I do not sense pneumonia or infection or sepsis.  She states she is breathing normally with cough much improved over previous pneumonia. Chest x-ray viewed by me   Orlie Dakin, MD 10/09/17 2340

## 2017-10-09 NOTE — ED Notes (Signed)
Ambulated pt to restroom located beside room while using walker, tolerated well.

## 2017-10-09 NOTE — ED Provider Notes (Signed)
[facility] EMERGENCY DEPARTMENT Provider Note   CSN: [id number] Arrival date & time: 10/09/17  1414     History   Chief Complaint Chief Complaint  Patient presents with  . Leg Swelling    HPI NIRALI MAGOUIRK is a 83 y.o. female presenting with bilateral leg swelling.  Patient states that on Saturday, she started to notice bilateral lower leg swelling.  It has worsened since then.  She denies increased activity, walking, or standing.  She reports she has had mild bilateral leg swelling in the past, but this is worse than ever before.  She states that due to swelling, she is having difficulty moving her legs, which caused her to fall this morning.  She was able to pull herself off the floor, and walk since then.  She denies injury from the fall.  She did not hit her head or lose consciousness.  She denies head neck back, or hip pain.  She is not on blood thinners.  She reports decreased urination over the past 24 hours, as well as decreased appetite/oral intake.  She states she is taking all of her medication as prescribed, including Lasix.  She denies fevers, chills, cough, chest pain, shortness of breath, nausea, vomiting, abdominal pain, or abnormal bowel movements. Patient states that she had pneumonia last month, and she has had improvement of symptoms.  HPI  Past Medical History:  Diagnosis Date  . Adenocarcinoma of right lung, stage 1 ([place]) 11/16/2016  . Arthritis   . Asthma   . COPD (chronic obstructive pulmonary disease) ([place])   . Diabetes mellitus without complication ([place])    Type II  . Family history of adverse reaction to anesthesia    "aunt had PONV"  . History of radiation therapy 12/19/16-12/27/16   SBRT to right upper lobe 54 Gy in 3 fractions  . Hypercholesteremia   . Hypertension   . Osteoporosis   . Positive PPD 12/16   PER PCP  . Shortness of breath dyspnea   . Tremors of nervous system    Left hand  . Urinary frequency   . Vitamin D  deficiency   . Wears glasses     Patient Active Problem List   Diagnosis Date Noted  . Shortness of breath 08/09/2017  . Microcytic anemia 07/18/2017  . CAP (community acquired pneumonia) 07/18/2017  . Asthma, chronic, unspecified asthma severity, with acute exacerbation 07/18/2017  . Type 2 diabetes mellitus without complication ([place]) 43/32/9518  . Adenocarcinoma of right lung, stage 1 ([place]) 11/16/2016  . Neoplasm of uncertain behavior of right upper lobe of lung 12/01/2015  . Positive PPD   . Pelvic prolapse 12/16/2014    Past Surgical History:  Procedure Laterality Date  . ABDOMINAL HYSTERECTOMY    . APPENDECTOMY    . BOWEL RESECTION    . BREAST BIOPSY Left   . COLONOSCOPY      OB History    No data available       Home Medications    Prior to Admission medications   Medication Sig Start Date End Date Taking? Authorizing Provider  albuterol (PROVENTIL HFA;VENTOLIN HFA) 108 (90 Base) MCG/ACT inhaler Inhale 1 puff into the lungs every 4 (four) hours as needed for wheezing or shortness of breath.   Yes [provider]  amLODipine (NORVASC) 10 MG tablet Take 10 mg by mouth daily.   Yes [provider]  aspirin EC 81 MG tablet Take 81 mg by mouth daily.   Yes [provider]  ergocalciferol (VITAMIN D2) 50000 units capsule Take 50,000 Units by mouth once a week.   Yes [provider]  ferrous sulfate 325 (65 FE) MG tablet Take 1 tablet (325 mg total) by mouth daily. 07/18/17  Yes Ocie Cornfield T, PA-C  gabapentin (NEURONTIN) 400 MG capsule Take 400 mg by mouth 2 (two) times daily.   Yes [provider]  glipiZIDE (GLUCOTROL) 5 MG tablet Take 5 mg by mouth daily before breakfast.   Yes [provider]  labetalol (NORMODYNE) 100 MG tablet Take 100 mg by mouth 2 (two) times daily.   Yes [provider]  lisinopril (PRINIVIL,ZESTRIL) 20 MG tablet Take 20 mg by mouth 2 (two) times daily.   Yes [provider]  metFORMIN (GLUCOPHAGE-XR) 500 MG 24 hr tablet Take 500 mg by mouth 2 (two) times daily. 06/20/17  Yes [provider]  potassium chloride (K-DUR) 10 MEQ tablet Take 10 mEq by mouth daily.   Yes [provider]  propranolol (INDERAL) 10 MG tablet Take 10 mg by mouth every 12 (twelve) hours.   Yes [provider]  simvastatin (ZOCOR) 20 MG tablet Take 20 mg by mouth at bedtime.    Yes [provider]  TRAVATAN Z 0.004 % SOLN ophthalmic solution Place 1 drop into both eyes at bedtime. 06/29/17  Yes [provider]  furosemide (LASIX) 20 MG tablet Take 3 tablets (60 mg total) 2 (two) times daily by mouth. 10/09/17   Kataleya Zaugg, PA-C    Family History Family History  Problem Relation Age of Onset  . Hypertension Father   . Diabetes Father     Social History Social History   Tobacco Use  . Smoking status: Former Smoker    Packs/day: 0.50    Years: 40.00    Pack years: 20.00    Types: Cigarettes    Last attempt to quit: 12/08/2001    Years since quitting: 15.8  . Smokeless tobacco: Never Used  . Tobacco comment: quit about 15-16 years ago; 12/09/2015  Substance Use Topics  . Alcohol use: No  . Drug use: No     Allergies   Patient has no known allergies.   Review of Systems Review of Systems  Cardiovascular: Positive for leg swelling.  Genitourinary: Positive for decreased urine volume.  All other systems reviewed and are negative.    Physical Exam Updated Vital Signs BP (!) 124/53 (BP Location: Right Arm)   Pulse 69   Temp 98.1 F (36.7 C) (Oral)   Resp (!) 23   SpO2 97%   Physical Exam  Constitutional: She is oriented to person, place, and time. She appears well-developed and well-nourished. No distress.  HENT:  Head: Normocephalic and atraumatic.  Eyes: EOM are normal.  Neck: Normal range of motion. Neck supple.  Cardiovascular: Normal rate, regular rhythm and intact distal pulses.  Pulmonary/Chest: Effort  normal and breath sounds normal. No respiratory distress. She has no wheezes. She has no rales. She exhibits no tenderness.  Abdominal: Soft. She exhibits no distension and no mass. There is no tenderness. There is no guarding.  Musculoskeletal: She exhibits edema.  Bilateral pitting edema of lower extremities not extending past the knees.  Pedal pulses equal bilaterally.  Sensation intact bilaterally.  Color and warmth equal bilaterally.  Neurological: She is alert and oriented to person, place, and time. No sensory deficit.  Skin: Skin is warm and dry.  Psychiatric: She has a normal mood and affect.  Nursing note and vitals reviewed.    ED Treatments / Results  Labs (all labs ordered are listed, but only abnormal results are displayed) Labs Reviewed  COMPREHENSIVE METABOLIC PANEL - Abnormal; Notable for the following components:      Result Value   Sodium 134 (*)    Glucose, Bld 111 (*)    Calcium 8.7 (*)    Total Protein 6.0 (*)    Albumin 2.3 (*)    Alkaline Phosphatase 132 (*)    GFR calc non Af Amer 57 (*)    All other components within normal limits  CBC WITH DIFFERENTIAL/PLATELET - Abnormal; Notable for the following components:   WBC 26.9 (*)    RBC 3.69 (*)    Hemoglobin 8.4 (*)    HCT 28.0 (*)    MCV 75.9 (*)    MCH 22.8 (*)    RDW 20.2 (*)    Monocytes Absolute 1.3 (*)    Eosinophils Absolute 17.0 (*)    All other components within normal limits  URINALYSIS, ROUTINE W REFLEX MICROSCOPIC - Abnormal; Notable for the following components:   Color, Urine STRAW (*)    All other components within normal limits  BRAIN NATRIURETIC PEPTIDE - Abnormal; Notable for the following components:   B Natriuretic Peptide 308.2 (*)    All other components within normal limits  URINE CULTURE    EKG  EKG Interpretation  Date/Time:  Tuesday October 09 2017 16:50:13 EST Ventricular Rate:  66 PR Interval:    QRS Duration: 98 QT Interval:  434 QTC Calculation: 455 R  Axis:   27 Text Interpretation:  Sinus rhythm Low voltage, precordial leads Abnormal R-wave progression, early transition Baseline wander in lead(s) V2 No significant change since last tracing Confirmed by Orlie Dakin [phone]) on 10/09/2017 4:57:36 PM       Radiology Dg Chest 2 View  Result Date: 10/09/2017 CLINICAL DATA:  Productive cough. EXAM: CHEST  2 VIEW COMPARISON:  08/22/2017, 08/17/2017, 08/01/2017, 07/18/2017 and CT scan dated 05/16/2017 FINDINGS: There are new areas of infiltrate in the left lung apex and in the left midzone. There is persistent or recurrent infiltrate in the right midzone. Clearing of the patchy infiltrate at the right lung base. Minimal bilateral pleural effusions with tenting of the diaphragm bilaterally, new on the left. Heart size and vascularity are normal. Aortic atherosclerosis. No acute bone abnormality. Moderate arthritis of the left glenohumeral joint. IMPRESSION: 1. New patchy infiltrates in the left lung apex in the left midzone with minimal bilateral pleural effusions. 2. New persistent or recurrent infiltrate in the right midzone. 3. Clearing of the infiltrate at the right lung base. 4. Aortic atherosclerosis. Electronically Signed   By: Lorriane Shire M.D.   On: 10/09/2017 17:26    Procedures Procedures (including critical care time)  Medications Ordered in ED Medications  furosemide (LASIX) tablet 40 mg (40 mg Oral Given 10/09/17 1805)     Initial Impression / Assessment and Plan / ED Course  I have reviewed the triage vital signs and the nursing notes.  Pertinent labs & imaging results that were available during my care of the patient were reviewed by me and considered in my medical decision making (see chart for details).     Patient presenting with bilateral leg swelling for the past several days.  Physical exam shows bilateral pitting edema.  No other abnormality noted.  Lung sounds clear, patient appears nontoxic.  She is afebrile.  Will  order basic labs, BMP, UA,  and chest x-ray.   UA negative for infection.  BMP slightly elevated at 308.  CMP reassuring.  CBC shows leukocytosis, unknown source at this time, clinically does not appear to be an infectious source.  Chest x-ray clinically does not match pt, as lung exam benign, and pt doe snot appear to have infection. Case discussed with attending, Dr. Winfred Leeds evaluated the patient and agrees to plan.  Will give extra dose of Lasix and increase patient's at home dose.  Discussed findings with patient.  Patient to follow-up with primary care for further management of Lasix and reevaluation of symptoms.  At this time, patient appears safe for discharge.  Return precautions given.  Patient states she understands and agrees to plan.    Final Clinical Impressions(s) / ED Diagnoses   Final diagnoses:  Bilateral lower extremity edema    ED Discharge Orders        Ordered    furosemide (LASIX) 20 MG tablet  2 times daily     10/09/17 1804       Patty Leitzke, PA-C 10/10/17 0230    Franchot Heidelberg, PA-C 10/10/17 0232    Orlie Dakin, MD 10/14/17 1254

## 2017-10-09 NOTE — ED Notes (Signed)
Pt taken to Xray.

## 2017-10-10 ENCOUNTER — Ambulatory Visit: Payer: Medicare Other | Admitting: Cardiothoracic Surgery

## 2017-10-11 LAB — PATHOLOGIST SMEAR REVIEW

## 2017-10-11 LAB — URINE CULTURE: Culture: NO GROWTH

## 2017-10-22 ENCOUNTER — Encounter (HOSPITAL_COMMUNITY): Payer: Self-pay

## 2017-10-22 ENCOUNTER — Inpatient Hospital Stay (HOSPITAL_COMMUNITY): Payer: Medicare Other

## 2017-10-22 ENCOUNTER — Emergency Department (HOSPITAL_COMMUNITY): Payer: Medicare Other

## 2017-10-22 ENCOUNTER — Inpatient Hospital Stay (HOSPITAL_COMMUNITY)
Admission: EM | Admit: 2017-10-22 | Discharge: 2017-10-25 | DRG: 281 | Disposition: A | Discharge status: Home Health | Payer: Medicare Other | Attending: Family Medicine | Admitting: Family Medicine

## 2017-10-22 ENCOUNTER — Other Ambulatory Visit: Payer: Self-pay

## 2017-10-22 DIAGNOSIS — E119 Type 2 diabetes mellitus without complications: Secondary | ICD-10-CM | POA: Diagnosis present

## 2017-10-22 DIAGNOSIS — D72829 Elevated white blood cell count, unspecified: Secondary | ICD-10-CM | POA: Diagnosis not present

## 2017-10-22 DIAGNOSIS — R600 Localized edema: Secondary | ICD-10-CM | POA: Diagnosis not present

## 2017-10-22 DIAGNOSIS — S3992XA Unspecified injury of lower back, initial encounter: Secondary | ICD-10-CM | POA: Diagnosis not present

## 2017-10-22 DIAGNOSIS — E876 Hypokalemia: Secondary | ICD-10-CM | POA: Diagnosis not present

## 2017-10-22 DIAGNOSIS — Y842 Radiological procedure and radiotherapy as the cause of abnormal reaction of the patient, or of later complication, without mention of misadventure at the time of the procedure: Secondary | ICD-10-CM | POA: Diagnosis present

## 2017-10-22 DIAGNOSIS — I509 Heart failure, unspecified: Secondary | ICD-10-CM

## 2017-10-22 DIAGNOSIS — Z87891 Personal history of nicotine dependence: Secondary | ICD-10-CM

## 2017-10-22 DIAGNOSIS — J449 Chronic obstructive pulmonary disease, unspecified: Secondary | ICD-10-CM | POA: Diagnosis present

## 2017-10-22 DIAGNOSIS — R29810 Facial weakness: Secondary | ICD-10-CM | POA: Diagnosis not present

## 2017-10-22 DIAGNOSIS — M47812 Spondylosis without myelopathy or radiculopathy, cervical region: Secondary | ICD-10-CM | POA: Diagnosis present

## 2017-10-22 DIAGNOSIS — Z923 Personal history of irradiation: Secondary | ICD-10-CM | POA: Diagnosis not present

## 2017-10-22 DIAGNOSIS — Z9181 History of falling: Secondary | ICD-10-CM | POA: Diagnosis not present

## 2017-10-22 DIAGNOSIS — M48061 Spinal stenosis, lumbar region without neurogenic claudication: Secondary | ICD-10-CM | POA: Diagnosis present

## 2017-10-22 DIAGNOSIS — R296 Repeated falls: Secondary | ICD-10-CM | POA: Diagnosis not present

## 2017-10-22 DIAGNOSIS — M4802 Spinal stenosis, cervical region: Secondary | ICD-10-CM | POA: Diagnosis present

## 2017-10-22 DIAGNOSIS — I214 Non-ST elevation (NSTEMI) myocardial infarction: Secondary | ICD-10-CM | POA: Diagnosis present

## 2017-10-22 DIAGNOSIS — S99921A Unspecified injury of right foot, initial encounter: Secondary | ICD-10-CM | POA: Diagnosis not present

## 2017-10-22 DIAGNOSIS — R778 Other specified abnormalities of plasma proteins: Secondary | ICD-10-CM

## 2017-10-22 DIAGNOSIS — E785 Hyperlipidemia, unspecified: Secondary | ICD-10-CM | POA: Diagnosis present

## 2017-10-22 DIAGNOSIS — I110 Hypertensive heart disease with heart failure: Secondary | ICD-10-CM | POA: Diagnosis not present

## 2017-10-22 DIAGNOSIS — M1990 Unspecified osteoarthritis, unspecified site: Secondary | ICD-10-CM | POA: Diagnosis present

## 2017-10-22 DIAGNOSIS — W19XXXA Unspecified fall, initial encounter: Secondary | ICD-10-CM | POA: Diagnosis not present

## 2017-10-22 DIAGNOSIS — I10 Essential (primary) hypertension: Secondary | ICD-10-CM | POA: Diagnosis not present

## 2017-10-22 DIAGNOSIS — Z9114 Patient's other noncompliance with medication regimen: Secondary | ICD-10-CM | POA: Diagnosis not present

## 2017-10-22 DIAGNOSIS — J700 Acute pulmonary manifestations due to radiation: Secondary | ICD-10-CM | POA: Diagnosis not present

## 2017-10-22 DIAGNOSIS — I5033 Acute on chronic diastolic (congestive) heart failure: Secondary | ICD-10-CM | POA: Diagnosis present

## 2017-10-22 DIAGNOSIS — I340 Nonrheumatic mitral (valve) insufficiency: Secondary | ICD-10-CM | POA: Diagnosis not present

## 2017-10-22 DIAGNOSIS — M545 Low back pain: Secondary | ICD-10-CM | POA: Diagnosis not present

## 2017-10-22 DIAGNOSIS — R609 Edema, unspecified: Secondary | ICD-10-CM

## 2017-10-22 DIAGNOSIS — D649 Anemia, unspecified: Secondary | ICD-10-CM | POA: Diagnosis not present

## 2017-10-22 DIAGNOSIS — Z7984 Long term (current) use of oral hypoglycemic drugs: Secondary | ICD-10-CM

## 2017-10-22 DIAGNOSIS — C3491 Malignant neoplasm of unspecified part of right bronchus or lung: Secondary | ICD-10-CM | POA: Diagnosis not present

## 2017-10-22 DIAGNOSIS — Z79899 Other long term (current) drug therapy: Secondary | ICD-10-CM | POA: Diagnosis not present

## 2017-10-22 DIAGNOSIS — Z8673 Personal history of transient ischemic attack (TIA), and cerebral infarction without residual deficits: Secondary | ICD-10-CM

## 2017-10-22 DIAGNOSIS — R7989 Other specified abnormal findings of blood chemistry: Secondary | ICD-10-CM | POA: Diagnosis not present

## 2017-10-22 DIAGNOSIS — R51 Headache: Secondary | ICD-10-CM | POA: Diagnosis not present

## 2017-10-22 DIAGNOSIS — R918 Other nonspecific abnormal finding of lung field: Secondary | ICD-10-CM | POA: Diagnosis not present

## 2017-10-22 DIAGNOSIS — M21371 Foot drop, right foot: Secondary | ICD-10-CM | POA: Diagnosis not present

## 2017-10-22 DIAGNOSIS — D509 Iron deficiency anemia, unspecified: Secondary | ICD-10-CM | POA: Diagnosis not present

## 2017-10-22 DIAGNOSIS — M21379 Foot drop, unspecified foot: Secondary | ICD-10-CM | POA: Diagnosis present

## 2017-10-22 DIAGNOSIS — R748 Abnormal levels of other serum enzymes: Secondary | ICD-10-CM | POA: Diagnosis not present

## 2017-10-22 DIAGNOSIS — I5031 Acute diastolic (congestive) heart failure: Secondary | ICD-10-CM | POA: Diagnosis not present

## 2017-10-22 DIAGNOSIS — M810 Age-related osteoporosis without current pathological fracture: Secondary | ICD-10-CM | POA: Diagnosis present

## 2017-10-22 DIAGNOSIS — S99911A Unspecified injury of right ankle, initial encounter: Secondary | ICD-10-CM | POA: Diagnosis not present

## 2017-10-22 LAB — CBC WITH DIFFERENTIAL/PLATELET
BAND NEUTROPHILS: 0 %
BASOS ABS: 0 10*3/uL (ref 0.0–0.1)
BLASTS: 0 %
Basophils Relative: 0 %
EOS ABS: 13.1 10*3/uL — AB (ref 0.0–0.7)
EOS PCT: 55 %
HCT: 25.9 % — ABNORMAL LOW (ref 36.0–46.0)
Hemoglobin: 7.7 g/dL — ABNORMAL LOW (ref 12.0–15.0)
LYMPHS ABS: 1.4 10*3/uL (ref 0.7–4.0)
Lymphocytes Relative: 6 %
MCH: 22.8 pg — ABNORMAL LOW (ref 26.0–34.0)
MCHC: 29.7 g/dL — ABNORMAL LOW (ref 30.0–36.0)
MCV: 76.9 fL — ABNORMAL LOW (ref 78.0–100.0)
METAMYELOCYTES PCT: 0 %
MONOS PCT: 3 %
MYELOCYTES: 0 %
Monocytes Absolute: 0.7 10*3/uL (ref 0.1–1.0)
NEUTROS ABS: 8.6 10*3/uL — AB (ref 1.7–7.7)
Neutrophils Relative %: 36 %
Other: 0 %
Platelets: 345 10*3/uL (ref 150–400)
Promyelocytes Absolute: 0 %
RBC: 3.37 MIL/uL — ABNORMAL LOW (ref 3.87–5.11)
RDW: 21.5 % — ABNORMAL HIGH (ref 11.5–15.5)
WBC: 23.8 10*3/uL — AB (ref 4.0–10.5)
nRBC: 0 /100 WBC

## 2017-10-22 LAB — COMPREHENSIVE METABOLIC PANEL
ALBUMIN: 2.4 g/dL — AB (ref 3.5–5.0)
ALT: 22 U/L (ref 14–54)
AST: 33 U/L (ref 15–41)
Alkaline Phosphatase: 165 U/L — ABNORMAL HIGH (ref 38–126)
Anion gap: 9 (ref 5–15)
BILIRUBIN TOTAL: 0.6 mg/dL (ref 0.3–1.2)
BUN: 13 mg/dL (ref 6–20)
CO2: 23 mmol/L (ref 22–32)
CREATININE: 1.04 mg/dL — AB (ref 0.44–1.00)
Calcium: 8.5 mg/dL — ABNORMAL LOW (ref 8.9–10.3)
Chloride: 100 mmol/L — ABNORMAL LOW (ref 101–111)
GFR calc Af Amer: 55 mL/min — ABNORMAL LOW (ref >60)
GFR, EST NON AFRICAN AMERICAN: 48 mL/min — AB (ref >60)
GLUCOSE: 88 mg/dL (ref 65–99)
Potassium: 3.7 mmol/L (ref 3.5–5.1)
Sodium: 132 mmol/L — ABNORMAL LOW (ref 135–145)
TOTAL PROTEIN: 6 g/dL — AB (ref 6.5–8.1)

## 2017-10-22 LAB — CBG MONITORING, ED: Glucose-Capillary: 72 mg/dL (ref 65–99)

## 2017-10-22 LAB — URINALYSIS, ROUTINE W REFLEX MICROSCOPIC
Bilirubin Urine: NEGATIVE
Glucose, UA: NEGATIVE mg/dL
Hgb urine dipstick: NEGATIVE
KETONES UR: NEGATIVE mg/dL
LEUKOCYTES UA: NEGATIVE
Nitrite: NEGATIVE
PROTEIN: NEGATIVE mg/dL
Specific Gravity, Urine: 1.008 (ref 1.005–1.030)
pH: 7 (ref 5.0–8.0)

## 2017-10-22 LAB — BRAIN NATRIURETIC PEPTIDE: B NATRIURETIC PEPTIDE 5: 605.5 pg/mL — AB (ref 0.0–100.0)

## 2017-10-22 LAB — TROPONIN I: Troponin I: 1.77 ng/mL (ref <0.03)

## 2017-10-22 MED ORDER — GABAPENTIN 400 MG PO CAPS: 400.0000 mg | ORAL_CAPSULE | Freq: Two times a day (BID) | ORAL | Status: DC

## 2017-10-22 MED ORDER — METOPROLOL TARTRATE 5 MG/5ML IV SOLN
5.0000 mg | Freq: Three times a day (TID) | INTRAVENOUS | Status: DC
  Filled 2017-10-22: qty 5

## 2017-10-22 MED ORDER — DEXTROSE 50 % IV SOLN
50.0000 mL | INTRAVENOUS | Status: DC | PRN
  Administered 2017-10-22 – 2017-10-23 (×2): 50 mL via INTRAVENOUS
  Filled 2017-10-22 (×2): qty 50

## 2017-10-22 MED ORDER — ZOLPIDEM TARTRATE 5 MG PO TABS: 5.0000 mg | ORAL_TABLET | Freq: Every evening | ORAL | Status: DC | PRN

## 2017-10-22 MED ORDER — DM-GUAIFENESIN ER 30-600 MG PO TB12: 1.0000 | ORAL_TABLET | Freq: Two times a day (BID) | ORAL | Status: DC | PRN

## 2017-10-22 MED ORDER — SODIUM CHLORIDE 0.9% FLUSH: 3.0000 mL | INTRAVENOUS | Status: DC | PRN

## 2017-10-22 MED ORDER — ATORVASTATIN CALCIUM 80 MG PO TABS
80.0000 mg | ORAL_TABLET | Freq: Every day | ORAL | Status: DC
  Filled 2017-10-22: qty 1

## 2017-10-22 MED ORDER — FUROSEMIDE 10 MG/ML IJ SOLN
40.0000 mg | Freq: Every day | INTRAMUSCULAR | Status: DC
  Administered 2017-10-22 – 2017-10-24 (×3): 40 mg via INTRAVENOUS
  Filled 2017-10-22 (×3): qty 4

## 2017-10-22 MED ORDER — AMLODIPINE BESYLATE 5 MG PO TABS: 10.0000 mg | ORAL_TABLET | Freq: Every day | ORAL | Status: DC

## 2017-10-22 MED ORDER — LATANOPROST 0.005 % OP SOLN
1.0000 [drp] | Freq: Every day | OPHTHALMIC | Status: DC
  Administered 2017-10-24: 1 [drp] via OPHTHALMIC
  Filled 2017-10-22 (×2): qty 2.5

## 2017-10-22 MED ORDER — ASPIRIN EC 325 MG PO TBEC: 325.0000 mg | DELAYED_RELEASE_TABLET | Freq: Every day | ORAL | Status: DC

## 2017-10-22 MED ORDER — FUROSEMIDE 20 MG PO TABS: 60.0000 mg | ORAL_TABLET | Freq: Two times a day (BID) | ORAL | Status: DC

## 2017-10-22 MED ORDER — LABETALOL HCL 200 MG PO TABS: 100.0000 mg | ORAL_TABLET | Freq: Two times a day (BID) | ORAL | Status: DC

## 2017-10-22 MED ORDER — ACETAMINOPHEN 325 MG PO TABS: 650.0000 mg | ORAL_TABLET | ORAL | Status: DC | PRN

## 2017-10-22 MED ORDER — ACETAMINOPHEN 650 MG RE SUPP: 650.0000 mg | Freq: Four times a day (QID) | RECTAL | Status: DC | PRN

## 2017-10-22 MED ORDER — SODIUM CHLORIDE 0.9% FLUSH
3.0000 mL | Freq: Two times a day (BID) | INTRAVENOUS | Status: DC
  Administered 2017-10-23 – 2017-10-25 (×5): 3 mL via INTRAVENOUS

## 2017-10-22 MED ORDER — ALBUTEROL SULFATE (2.5 MG/3ML) 0.083% IN NEBU: 2.5000 mg | INHALATION_SOLUTION | RESPIRATORY_TRACT | Status: DC | PRN

## 2017-10-22 MED ORDER — FERROUS SULFATE 325 (65 FE) MG PO TABS: 325.0000 mg | ORAL_TABLET | Freq: Every day | ORAL | Status: DC

## 2017-10-22 MED ORDER — NITROGLYCERIN 0.4 MG SL SUBL: 0.4000 mg | SUBLINGUAL_TABLET | SUBLINGUAL | Status: DC | PRN

## 2017-10-22 MED ORDER — ONDANSETRON HCL 4 MG/2ML IJ SOLN: 4.0000 mg | Freq: Four times a day (QID) | INTRAMUSCULAR | Status: DC | PRN

## 2017-10-22 MED ORDER — ENOXAPARIN SODIUM 40 MG/0.4ML ~~LOC~~ SOLN: 40.0000 mg | Freq: Every day | SUBCUTANEOUS | Status: DC

## 2017-10-22 MED ORDER — SIMVASTATIN 20 MG PO TABS: 20.0000 mg | ORAL_TABLET | Freq: Every day | ORAL | Status: DC

## 2017-10-22 MED ORDER — LISINOPRIL 20 MG PO TABS: 20.0000 mg | ORAL_TABLET | Freq: Two times a day (BID) | ORAL | Status: DC

## 2017-10-22 MED ORDER — INSULIN ASPART 100 UNIT/ML ~~LOC~~ SOLN
0.0000 [IU] | Freq: Three times a day (TID) | SUBCUTANEOUS | Status: DC
  Administered 2017-10-23: 1 [IU] via SUBCUTANEOUS

## 2017-10-22 MED ORDER — INSULIN ASPART 100 UNIT/ML ~~LOC~~ SOLN
0.0000 [IU] | Freq: Every day | SUBCUTANEOUS | Status: DC
Start: 2017-10-22 — End: 2017-10-25

## 2017-10-22 MED ORDER — ASPIRIN 300 MG RE SUPP
300.0000 mg | Freq: Every day | RECTAL | Status: DC
  Administered 2017-10-23 (×2): 300 mg via RECTAL
  Filled 2017-10-22 (×3): qty 1

## 2017-10-22 MED ORDER — SODIUM CHLORIDE 0.9 % IV SOLN: 250.0000 mL | INTRAVENOUS | Status: DC | PRN

## 2017-10-22 MED ORDER — HYDRALAZINE HCL 20 MG/ML IJ SOLN: 5.0000 mg | INTRAMUSCULAR | Status: DC | PRN

## 2017-10-22 MED ORDER — ASPIRIN EC 81 MG PO TBEC: 81.0000 mg | DELAYED_RELEASE_TABLET | Freq: Every day | ORAL | Status: DC

## 2017-10-22 MED ORDER — MORPHINE SULFATE (PF) 4 MG/ML IV SOLN: 2.0000 mg | INTRAVENOUS | Status: DC | PRN

## 2017-10-22 NOTE — ED Triage Notes (Signed)
Pt has had leg swelling for several weeks. She was recently seen here for same. She still has bilateral edema, denies chest pain or shortness of breath. Pt states she fell a couple of weeks ago and now has pain in the left ankle/foot. Strong pedal pulse noted.

## 2017-10-22 NOTE — ED Notes (Signed)
Pt asking for food. Informed Nikki - RN.

## 2017-10-22 NOTE — ED Notes (Signed)
Patient transported to MRI 

## 2017-10-22 NOTE — ED Provider Notes (Addendum)
[facility] EMERGENCY DEPARTMENT Provider Note   CSN: [id number] Arrival date & time: 10/22/17  1324     History   Chief Complaint Chief Complaint  Patient presents with  . Leg Swelling    HPI Erin Carr is a 83 y.o. female.  HPI Patient presents with swelling of both her legs.  States that it has been going over the last 2-3 weeks.  Get seen on November 8 for the same thing.  Thought to be just volume overload at that time.  Had an x-ray that showed some infiltrate that was presumed to be chronic versus edema.  But since then had been doing rather stable but around a week ago ran out of her Lasix.  Reportedly was cold and wet so she could not get to the doctor.  Since then she has had more swelling.  Also states over the last week she has had some dragging of her right foot.  No trauma.  No headache or back pain.  Has a foot drop now and states she has not had this before.  Has had a little bit of a cough.  Some sputum production but states it is really unchanged.  No fevers.  Discussed patient about her previous cancer.  Patient denies having had cancer and states they just did radiation because they found a nodule on her lung.  Reviewing records it appears if that may have been an adenocarcinoma.  Did have recent pleural effusion drained by cardiothoracic surgery since she is to weak at baseline to have general anesthesia.  At that point it was thought to be radiation change and not recurrence of cancer with the changes.  That was 2-3 months ago.  Reportedly had some improvement with steroids and antibiotics.  Patient had appears to have some facial droop now.  Unsure of the time of onset of this also. Past Medical History:  Diagnosis Date  . Adenocarcinoma of right lung, stage 1 ([place]) 11/16/2016  . Arthritis   . Asthma   . COPD (chronic obstructive pulmonary disease) ([place])   . Diabetes mellitus without complication ([place])    Type II  . Family history of adverse  reaction to anesthesia    "aunt had PONV"  . History of radiation therapy 12/19/16-12/27/16   SBRT to right upper lobe 54 Gy in 3 fractions  . Hypercholesteremia   . Hypertension   . Osteoporosis   . Positive PPD 12/16   PER PCP  . Shortness of breath dyspnea   . Tremors of nervous system    Left hand  . Urinary frequency   . Vitamin D deficiency   . Wears glasses     Patient Active Problem List   Diagnosis Date Noted  . Acute CHF (congestive heart failure) ([place]) 10/22/2017  . Foot drop 10/22/2017  . Facial droop 10/22/2017  . NSTEMI (non-ST elevated myocardial infarction) ([place]) 10/22/2017  . Fall 10/22/2017  . Shortness of breath 08/09/2017  . Microcytic anemia 07/18/2017  . CAP (community acquired pneumonia) 07/18/2017  . Asthma, chronic, unspecified asthma severity, with acute exacerbation 07/18/2017  . Type 2 diabetes mellitus without complication ([place]) 71/69/6789  . Adenocarcinoma of right lung, stage 1 ([place]) 11/16/2016  . Neoplasm of uncertain behavior of right upper lobe of lung 12/01/2015  . Positive PPD   . Pelvic prolapse 12/16/2014    Past Surgical History:  Procedure Laterality Date  . ABDOMINAL HYSTERECTOMY    . ANTERIOR (CYSTOCELE) AND POSTERIOR REPAIR (RECTOCELE) N/A  12/16/2014   Performed by Crawford Givens, MD at [facility] ORS  . APPENDECTOMY    . BOWEL RESECTION    . BREAST BIOPSY Left   . COLONOSCOPY    . VIDEO BRONCHOSCOPY WITH ENDOBRONCHIAL ULTRASOUND N/A 12/17/2015   Performed by Ivin Poot, MD at [facility] OR    OB History    No data available       Home Medications    Prior to Admission medications   Medication Sig Start Date End Date Taking? Authorizing Provider  albuterol (PROVENTIL HFA;VENTOLIN HFA) 108 (90 Base) MCG/ACT inhaler Inhale 1 puff into the lungs every 4 (four) hours as needed for wheezing or shortness of breath.   Yes [provider]  amLODipine (NORVASC) 10 MG tablet Take 10 mg by mouth daily.   Yes [provider]  aspirin EC 81 MG tablet Take 81 mg by mouth daily.   Yes [provider]  ergocalciferol (VITAMIN D2) 50000 units capsule Take 50,000 Units by mouth once a week.   Yes [provider]  ferrous sulfate 325 (65 FE) MG tablet Take 1 tablet (325 mg total) by mouth daily. 07/18/17  Yes Leaphart, Zack Seal, PA-C  furosemide (LASIX) 20 MG tablet Take 3 tablets (60 mg total) 2 (two) times daily by mouth. 10/09/17  Yes Caccavale, Sophia, PA-C  gabapentin (NEURONTIN) 400 MG capsule Take 400 mg by mouth 2 (two) times daily.   Yes [provider]  glipiZIDE (GLUCOTROL) 5 MG tablet Take 5 mg by mouth daily before breakfast.   Yes [provider]  labetalol (NORMODYNE) 100 MG tablet Take 100 mg by mouth 2 (two) times daily.   Yes [provider]  lisinopril (PRINIVIL,ZESTRIL) 20 MG tablet Take 20 mg by mouth 2 (two) times daily.   Yes [provider]  metFORMIN (GLUCOPHAGE-XR) 500 MG 24 hr tablet Take 500 mg by mouth 2 (two) times daily. 06/20/17  Yes [provider]  potassium chloride (K-DUR) 10 MEQ tablet Take 10 mEq by mouth daily.   Yes [provider]  propranolol (INDERAL) 10 MG tablet Take 10 mg by mouth every 12 (twelve) hours.   Yes [provider]  simvastatin (ZOCOR) 20 MG tablet Take 20 mg by mouth at bedtime.    Yes [provider]  TRAVATAN Z 0.004 % SOLN ophthalmic solution Place 1 drop into both eyes at bedtime. 06/29/17  Yes [provider]    Family History Family History  Problem Relation Age of Onset  . Hypertension Father   . Diabetes Father     Social History Social History   Tobacco Use  . Smoking status: Former Smoker    Packs/day: 0.50    Years: 40.00    Pack years: 20.00    Types: Cigarettes    Last attempt to quit: 12/08/2001    Years since quitting: 15.8  . Smokeless tobacco: Never Used  . Tobacco comment: quit about 15-16 years ago; 12/09/2015  Substance Use Topics  .  Alcohol use: No  . Drug use: No     Allergies   Patient has no known allergies.   Review of Systems Review of Systems  Constitutional: Positive for fatigue. Negative for activity change.  HENT: Negative for congestion.   Respiratory: Positive for cough and shortness of breath.   Cardiovascular: Positive for leg swelling.  Gastrointestinal: Negative for abdominal pain.  Genitourinary: Negative for dysuria.  Musculoskeletal: Negative for back pain.  Neurological: Positive for weakness.  Psychiatric/Behavioral:  Negative for agitation and confusion.     Physical Exam Updated Vital Signs BP 137/66   Pulse 78   Temp 98.2 F (36.8 C)   Resp (!) 21   SpO2 98%   Physical Exam  Constitutional: She is oriented to person, place, and time. She appears well-developed.  HENT:  Head: Atraumatic.  Eyes: Pupils are equal, round, and reactive to light.  Neck: Neck supple.  Cardiovascular: Normal rate.  Pulmonary/Chest: No respiratory distress. She has no wheezes. She has no rales.  Abdominal: There is no tenderness.  Musculoskeletal:  Moderate pitting edema bilateral lower extremities up to knees.  Palpable pulses in both feet.  There is however a foot drop on the right side.  Good plantar flexion bilaterally.  Neurological: She is alert and oriented to person, place, and time.  Some asymmetry on face at rest.  Slightly unequal smile.  Good grip strength bilaterally.  Good flexion-extension at the upper extremities.  Sensation intact in both upper extremities.  Good plantar flexion of both feet.  Good straight leg raise bilaterally.  There is a foot drop on the right foot.  Skin: Skin is warm.     ED Treatments / Results  Labs (all labs ordered are listed, but only abnormal results are displayed) Labs Reviewed  COMPREHENSIVE METABOLIC PANEL - Abnormal; Notable for the following components:      Result Value   Sodium 132 (*)    Chloride 100 (*)    Creatinine, Ser 1.04 (*)     Calcium 8.5 (*)    Total Protein 6.0 (*)    Albumin 2.4 (*)    Alkaline Phosphatase 165 (*)    GFR calc non Af Amer 48 (*)    GFR calc Af Amer 55 (*)    All other components within normal limits  CBC WITH DIFFERENTIAL/PLATELET - Abnormal; Notable for the following components:   WBC 23.8 (*)    RBC 3.37 (*)    Hemoglobin 7.7 (*)    HCT 25.9 (*)    MCV 76.9 (*)    MCH 22.8 (*)    MCHC 29.7 (*)    RDW 21.5 (*)    Neutro Abs 8.6 (*)    Eosinophils Absolute 13.1 (*)    All other components within normal limits  TROPONIN I - Abnormal; Notable for the following components:   Troponin I 1.77 (*)    All other components within normal limits  BRAIN NATRIURETIC PEPTIDE - Abnormal; Notable for the following components:   B Natriuretic Peptide 605.5 (*)    All other components within normal limits  URINALYSIS, ROUTINE W REFLEX MICROSCOPIC    EKG  EKG Interpretation  Date/Time:  Monday October 22 2017 16:53:00 EST Ventricular Rate:  74 PR Interval:    QRS Duration: 101 QT Interval:  427 QTC Calculation: 474 R Axis:   34 Text Interpretation:  Sinus rhythm Low voltage, precordial leads Abnormal R-wave progression, early transition Minimal ST depression, anterolateral leads Confirmed by Davonna Belling [phone]) on 10/22/2017 5:31:31 PM       Radiology Dg Chest 2 View  Result Date: 10/22/2017 CLINICAL DATA:  Weakness.  Edema. EXAM: CHEST  2 VIEW COMPARISON:  10/09/2017 FINDINGS: The cardiac silhouette is normal in size. No mediastinal or hilar masses. Airspace opacity extends laterally from the right hilum into the right upper lobe. There is irregular interstitial thickening bilaterally. Patchy hazy airspace opacities in noted more diffusely primarily in both upper lobes. This appearance is similar to the  most recent prior exam. No pleural effusion.  No pneumothorax. Skeletal structures are demineralized but grossly intact. IMPRESSION: 1. Bilateral airspace and interstitial opacities,  most evident in the upper lobes, similar to the most recent prior exam. The findings have improved on the right when compared to a CT scan dated 08/07/2017. Residual lung opacities may be due to infection or inflammation. Edema is felt less likely particularly in the absence of pleural effusions. Electronically Signed   By: Lajean Manes M.D.   On: 10/22/2017 17:26   Dg Ankle Complete Right  Result Date: 10/22/2017 CLINICAL DATA:  Fall twisted ankle EXAM: RIGHT ANKLE - COMPLETE 3+ VIEW COMPARISON:  None. FINDINGS: There is no evidence of fracture, dislocation, or joint effusion. There is no evidence of arthropathy or other focal bone abnormality. Soft tissues are unremarkable. Calcaneal spurring plantar surface IMPRESSION: Negative. Electronically Signed   By: Franchot Gallo M.D.   On: 10/22/2017 14:59   Ct Head Wo Contrast  Result Date: 10/22/2017 CLINICAL DATA:  Frequent falls over the last 2 weeks. History of lung carcinoma. EXAM: CT HEAD WITHOUT CONTRAST TECHNIQUE: Contiguous axial images were obtained from the base of the skull through the vertex without intravenous contrast. COMPARISON:  None. FINDINGS: Brain: No evidence of acute infarction, hemorrhage, hydrocephalus, extra-axial collection or mass lesion/mass effect. There is ventricular and sulcal enlargement reflecting mild generalized atrophy. Vascular: No hyperdense vessel or unexpected calcification. Skull: Normal. Negative for fracture or focal lesion. Sinuses/Orbits: Globes and orbits are unremarkable. There is mild ethmoid and minor maxillary sinus mucosal thickening. Remaining sinuses and mastoid air cells are clear. Other: None. IMPRESSION: 1. No acute intracranial abnormalities. 2. Mild generalized atrophy. Electronically Signed   By: Lajean Manes M.D.   On: 10/22/2017 17:28   Dg Foot Complete Right  Result Date: 10/22/2017 CLINICAL DATA:  Fall.  Twisted ankle EXAM: RIGHT FOOT COMPLETE - 3+ VIEW COMPARISON:  None. FINDINGS: There  is no evidence of fracture or dislocation. There is no evidence of arthropathy or other focal bone abnormality. Soft tissues are unremarkable. IMPRESSION: Negative. Electronically Signed   By: Franchot Gallo M.D.   On: 10/22/2017 15:00    Procedures Procedures (including critical care time)  Medications Ordered in ED Medications - No data to display   Initial Impression / Assessment and Plan / ED Course  I have reviewed the triage vital signs and the nursing notes.  Pertinent labs & imaging results that were available during my care of the patient were reviewed by me and considered in my medical decision making (see chart for details).     Patient presents with swelling in her legs.  Has been noncompliant with her Lasix.  Also has elevated white count.  It was also elevated 11 days ago when she was in the ER.  Has had no real change in her cough but does have chronic infiltrates.  There is question of pneumonitis versus infection in the past.  No fevers or chills.  Also has a new left-sided facial droop and right foot drop.  Has been going on for at least a week with that.  Not a TPA candidate due to this time onset. Patient has no chest pain and has no chest pain but has an elevated troponin of 1.8.  EKG is reassuring.  With all these complaints I feels that the patient would benefit from admission.  Will admit to hospitalist.  Final Clinical Impressions(s) / ED Diagnoses   Final diagnoses:  Peripheral edema  Noncompliance  with medication regimen  Leukocytosis, unspecified type  Facial droop  Foot drop, right  Elevated troponin    ED Discharge Orders    None       Davonna Belling, MD 10/22/17 2029    Davonna Belling, MD 10/22/17 2102

## 2017-10-22 NOTE — Consult Note (Signed)
Neurology Consultation Reason for Consult: Possible stroke  Referring Physician: Dr Blaine Hamper  CC: Leg swelling   History is obtained from: patient and chart  HPI: Erin Carr is a 83 y.o. female with medical history significant of hypertension, hyperlipidemia, diabetes mellitus, COPD, asthma, iron deficiency anemia, right lung non-small cell cancer (s/p of XTR), radiation pneumonitis who presents to the ER for  leg swelling and right leg weakness.   Patient has been complaining of bilateral leg swelling x 4-5 week. She also has been complaining of weakness in her right leg for last few weeks. She was assessed in the emergency room and was diagnosed to have acute heart failure as well as NSTEMI and primary care team is considering starting the patient on heparin drip. She was also noted to have a left facial droop. Given exam findings of left facial droop and right leg weakness, there was a concern the patient may have had a stroke. Neurology was consulted. A stat CT head was performed which showed no acute infarcts, an MRI brain was performed and confirmed the patient did not have an acute stroke.    ROS: A 14 point ROS was performed and is negative except as noted in the HPI.  Past Medical History:  Diagnosis Date  . Adenocarcinoma of right lung, stage 1 ([place]) 11/16/2016  . Arthritis   . Asthma   . COPD (chronic obstructive pulmonary disease) ([place])   . Diabetes mellitus without complication ([place])    Type II  . Family history of adverse reaction to anesthesia    "aunt had PONV"  . History of radiation therapy 12/19/16-12/27/16   SBRT to right upper lobe 54 Gy in 3 fractions  . Hypercholesteremia   . Hypertension   . Osteoporosis   . Positive PPD 12/16   PER PCP  . Shortness of breath dyspnea   . Tremors of nervous system    Left hand  . Urinary frequency   . Vitamin D deficiency   . Wears glasses      Family History  Problem Relation Age of Onset  . Hypertension Father   .  Diabetes Father      Social History:  reports that she quit smoking about 15 years ago. Her smoking use included cigarettes. She has a 20.00 pack-year smoking history. she has never used smokeless tobacco. She reports that she does not drink alcohol or use drugs.   Exam: Current vital signs: BP (!) 130/55   Pulse 83   Temp 98.2 F (36.8 C)   Resp (!) 21   Ht 5\' 1"  (1.549 m)   Wt 61.2 kg (135 lb) Comment: from office visit 08/2017  SpO2 97%   BMI 25.51 kg/m  Vital signs in last 24 hours: Temp:  [98.2 F (36.8 C)] 98.2 F (36.8 C) (11/19 1743) Pulse Rate:  [67-84] 83 (11/20 0530) Resp:  [14-22] 21 (11/20 0530) BP: (104-158)/(46-95) 130/55 (11/20 0530) SpO2:  [95 %-100 %] 97 % (11/20 0530) Weight:  [61.2 kg (135 lb)] 61.2 kg (135 lb) (11/20 0015)   Physical Exam  Constitutional: Appears well-developed and well-nourished.  Psych: Affect appropriate to situation Eyes: No scleral injection HENT: No OP obstrucion Head: Normocephalic.  Cardiovascular: Normal rate and regular rhythm.  Respiratory: Effort normal and breath sounds normal to anterior ascultation GI: Soft.  No distension. There is no tenderness.  Skin: WDI  Neuro: Mental Status: Patient is awake, alert, oriented to person, place, month, year, and situation. Patient is able to  give a clear and coherent history. No signs of aphasia or neglect Cranial Nerves: II:  Pupils are equal, round, and reactive to light.   III,IV, VI: EOMI without ptosis or diploplia.  V: Facial sensation is symmetric to temperature VII: mild left facial assymetry   VIII: hearing is intact to voice X: Uvula elevates symmetrically XI: Shoulder shrug is symmetric. XII: tongue is midline without atrophy or fasciculations.  Motor: Tone is normal. Bulk is normal. 5/5 strength was present in both UE. R LE  4/5 with weakness most prominent to dorsiflexion at the ankle, hip and knee flexor and extensor strength 5/5.  Left leg  5/5 Sensory: Sensation is symmetric to light touch and temperature in the arms and legs. Deep Tendon Reflexes: 2+ and symmetric in the biceps and patellae. Difficult to due ankle reflexes as both feet were swollen  Plantars: Toes are downgoing bilaterally.  Cerebellar: FNF  are intact bilaterally      I have reviewed labs in epic and the results pertinent to this consultation are:   I have reviewed the images obtained including CT scan and MRI Brain: no acute stroke     ASSESSMENT AND PLAN 83 y.o. female with medical history significant of hypertension, hyperlipidemia, diabetes mellitus, COPD, asthma, iron deficiency anemia, right lung non-small cell cancer (s/p of XTR), radiation pneumonitis who presents to the ER for  leg swelling and right leg weakness. Right leg weakness appears to be a foot drop and not from an acute stroke with MRI of the brain negative for acute/subacute strokes.     Right foot droop Recommend AFO brace PT/OT eval Outpatient EMG/NCS and  MRI L Spine can be performed as outpatient    Chronic microvascular disease and remote lacunar infarcts Patient has multiple risk factors for ischemic stroke Facial droop maybe from old stroke  Would recommend  Carotid US Echo ( likely will be performed for acute CHF)  Patient on ASA 81mg  at home, please continue Recommend patient continue statin ( preferably high intensity statin if tolerated)  Please monitor for Afib because then patient needs to be anticoagulated if present PT/OT    Karena Addison Jeyson Deshotel MD [facility] [phone]  If 7pm to 7am, please call on call as listed on AMION.

## 2017-10-22 NOTE — ED Notes (Signed)
Sent labels to lab to add on

## 2017-10-22 NOTE — ED Notes (Signed)
Paged Dr Blaine Hamper for dextrose order

## 2017-10-22 NOTE — ED Notes (Signed)
Pt's CBG result was 72. Informed Nikki - RN.

## 2017-10-22 NOTE — H&P (Addendum)
History and Physical    Erin Carr PZW:[id number] DOB: [birth year] DOA: 10/22/2017  Referring MD/NP/PA:   PCP: Vincente Liberty, MD   Patient coming from:  The patient is coming from home.  At baseline, pt is independent for most of ADL  Chief Complaint: Leg swelling, shortness breath, left facial droop, right drop and fall  HPI: Erin Carr is a 83 y.o. female with medical history significant of hypertension, hyperlipidemia, diabetes mellitus, COPD, asthma, iron deficiency anemia, right lung non-small cell cancer (s/p of XTR), radiation pneumonitis, who presents with Leg swelling, shortness breath, left facial droop, right drop and fall.  Patient states that she has been having bilateral leg edema for 4-5 weeks, which has been progressively getting worse. She has mild SOB. She was seen in ED on 11/60/18, and discharged on Lasix 20 mg daily. Patient states that she is taking Lasix, but her leg swelling has been getting worse. She has mild cough with white mucus production. Denies fever or chills. Also denies chest pain. Patient was noted to have left facial droop. She states that she noted left facial droop approximately 1 month ago. She denies unilateral weakness, numbness or tingling to extremities. No vision change, hearing loss or slurred speech. She states that she had fall several weeks ago. She complains of right ankle and right foot pain. She also has right foot drop. Patient does not have nausea, vomiting, diarrhea, abdominal pain, symptoms of UTI. No fever or chills.  ED Course: pt was found to have WBC 23.8, troponin 1.11, BNP 605.5, negative urinalysis, creatinine 1.04, temperature normal, no tachycardia, no tachypnea, oxygen saturation 98% on room air. CT head is negative for acute intracranial abnormalities. X-ray of her right ankle and right foot is negative for bony fracture. Patient is admitted to telemetry bed as inpatient. Dr. Lorraine Lax of neurology an Dr. Jacqlyn Krauss of card  were consulted. Pt is admitted to tele bed as inpt.  CXR showed: Bilateral airspace and interstitial opacities, most evident in the upper lobes, similar to the most recent prior exam. The findings have improved on the right when compared to a CT scan dated 08/07/2017. Residual lung opacities may be due to infection or inflammation. Edema is felt less likely particularly in the absence of pleural effusions.  Review of Systems:   General: no fevers, chills,  has fatigue HEENT: no blurry vision, hearing changes or sore throat Respiratory: has dyspnea, coughing, no heezing CV: no chest pain, no palpitations GI: no nausea, vomiting, abdominal pain, diarrhea, constipation GU: no dysuria, burning on urination, increased urinary frequency, hematuria  Ext: has leg edema Neuro: no unilateral weakness, numbness, or tingling, no vision change or hearing loss. Has left facial droop and right foot drop. Skin: no rash, no skin tear. MSK: No muscle spasm, no deformity, no limitation of range of movement in spin Heme: No easy bruising.  Travel history: No recent long distant travel.  Allergy: No Known Allergies  Past Medical History:  Diagnosis Date  . Adenocarcinoma of right lung, stage 1 ([place]) 11/16/2016  . Arthritis   . Asthma   . COPD (chronic obstructive pulmonary disease) ([place])   . Diabetes mellitus without complication ([place])    Type II  . Family history of adverse reaction to anesthesia    "aunt had PONV"  . History of radiation therapy 12/19/16-12/27/16   SBRT to right upper lobe 54 Gy in 3 fractions  . Hypercholesteremia   . Hypertension   . Osteoporosis   . Positive  PPD 12/16   PER PCP  . Shortness of breath dyspnea   . Tremors of nervous system    Left hand  . Urinary frequency   . Vitamin D deficiency   . Wears glasses     Past Surgical History:  Procedure Laterality Date  . ABDOMINAL HYSTERECTOMY    . ANTERIOR (CYSTOCELE) AND POSTERIOR REPAIR (RECTOCELE) N/A 12/16/2014    Performed by Crawford Givens, MD at [facility] ORS  . APPENDECTOMY    . BOWEL RESECTION    . BREAST BIOPSY Left   . COLONOSCOPY    . VIDEO BRONCHOSCOPY WITH ENDOBRONCHIAL ULTRASOUND N/A 12/17/2015   Performed by Prescott Gum, Collier Salina, MD at [facility] History:  reports that she quit smoking about 15 years ago. Her smoking use included cigarettes. She has a 20.00 pack-year smoking history. she has never used smokeless tobacco. She reports that she does not drink alcohol or use drugs.  Family History:  Family History  Problem Relation Age of Onset  . Hypertension Father   . Diabetes Father      Prior to Admission medications   Medication Sig Start Date End Date Taking? Authorizing Provider  albuterol (PROVENTIL HFA;VENTOLIN HFA) 108 (90 Base) MCG/ACT inhaler Inhale 1 puff into the lungs every 4 (four) hours as needed for wheezing or shortness of breath.   Yes [provider]  amLODipine (NORVASC) 10 MG tablet Take 10 mg by mouth daily.   Yes [provider]  aspirin EC 81 MG tablet Take 81 mg by mouth daily.   Yes [provider]  ergocalciferol (VITAMIN D2) 50000 units capsule Take 50,000 Units by mouth once a week.   Yes [provider]  ferrous sulfate 325 (65 FE) MG tablet Take 1 tablet (325 mg total) by mouth daily. 07/18/17  Yes Leaphart, Zack Seal, PA-C  furosemide (LASIX) 20 MG tablet Take 3 tablets (60 mg total) 2 (two) times daily by mouth. 10/09/17  Yes Caccavale, Sophia, PA-C  gabapentin (NEURONTIN) 400 MG capsule Take 400 mg by mouth 2 (two) times daily.   Yes [provider]  glipiZIDE (GLUCOTROL) 5 MG tablet Take 5 mg by mouth daily before breakfast.   Yes [provider]  labetalol (NORMODYNE) 100 MG tablet Take 100 mg by mouth 2 (two) times daily.   Yes [provider]  lisinopril (PRINIVIL,ZESTRIL) 20 MG tablet Take 20 mg by mouth 2 (two) times daily.   Yes [provider]  metFORMIN (GLUCOPHAGE-XR) 500 MG 24 hr  tablet Take 500 mg by mouth 2 (two) times daily. 06/20/17  Yes [provider]  potassium chloride (K-DUR) 10 MEQ tablet Take 10 mEq by mouth daily.   Yes [provider]  propranolol (INDERAL) 10 MG tablet Take 10 mg by mouth every 12 (twelve) hours.   Yes [provider]  simvastatin (ZOCOR) 20 MG tablet Take 20 mg by mouth at bedtime.    Yes [provider]  TRAVATAN Z 0.004 % SOLN ophthalmic solution Place 1 drop into both eyes at bedtime. 06/29/17  Yes [provider]    Physical Exam: Vitals:   10/22/17 2045 10/22/17 2130 10/22/17 2200 10/22/17 2245  BP: 137/66 (!) 147/57 (!) 145/64 133/80  Pulse: 78 79 76 84  Resp: (!) 21 (!) 21 19 19   Temp:      TempSrc:      SpO2: 98% 98% 98% 96%   General: Not in acute distress HEENT:  Eyes: PERRL, EOMI, no scleral icterus.       ENT: No discharge from the ears and nose, no pharynx injection, no tonsillar enlargement.        Neck: positive JVD, no bruit, no mass felt. Heme: No neck lymph node enlargement. Cardiac: S1/S2, RRR, No murmurs, No gallops or rubs. Respiratory: No rales, wheezing, rhonchi or rubs. GI: Soft, nondistended, nontender, no rebound pain, no organomegaly, BS present. GU: No hematuria Ext: 3+ pitting leg edema bilaterally. 2+DP/PT pulse bilaterally. Musculoskeletal: No joint deformities, No joint redness or warmth, no limitation of ROM in spin. Skin: No rashes.  Neuro: Alert, oriented X3, cranial nerves II-XII grossly intact except for left facial droop. Muscle strength 5/5 in all extremities, sensation to light touch intact. Brachial reflex 2+ bilaterally. Negative Babinski's sign. Has right foot drop. Psych: Patient is not psychotic, no suicidal or hemocidal ideation.  Labs on Admission: I have personally reviewed following labs and imaging studies  CBC: Recent Labs  Lab 10/22/17 1424  WBC 23.8*  NEUTROABS 8.6*  HGB 7.7*  HCT 25.9*  MCV 76.9*  PLT 371   Basic  Metabolic Panel: Recent Labs  Lab 10/22/17 1424  NA 132*  K 3.7  CL 100*  CO2 23  GLUCOSE 88  BUN 13  CREATININE 1.04*  CALCIUM 8.5*   GFR: CrCl cannot be calculated (Unknown ideal weight.). Liver Function Tests: Recent Labs  Lab 10/22/17 1424  AST 33  ALT 22  ALKPHOS 165*  BILITOT 0.6  PROT 6.0*  ALBUMIN 2.4*   No results for input(s): LIPASE, AMYLASE in the last 168 hours. No results for input(s): AMMONIA in the last 168 hours. Coagulation Profile: No results for input(s): INR, PROTIME in the last 168 hours. Cardiac Enzymes: Recent Labs  Lab 10/22/17 1424  TROPONINI 1.77*   BNP (last 3 results) No results for input(s): PROBNP in the last 8760 hours. HbA1C: No results for input(s): HGBA1C in the last 72 hours. CBG: Recent Labs  Lab 10/22/17 2150  GLUCAP 72   Lipid Profile: No results for input(s): CHOL, HDL, LDLCALC, TRIG, CHOLHDL, LDLDIRECT in the last 72 hours. Thyroid Function Tests: No results for input(s): TSH, T4TOTAL, FREET4, T3FREE, THYROIDAB in the last 72 hours. Anemia Panel: No results for input(s): VITAMINB12, FOLATE, FERRITIN, TIBC, IRON, RETICCTPCT in the last 72 hours. Urine analysis:    Component Value Date/Time   COLORURINE YELLOW 10/22/2017 1745   APPEARANCEUR CLEAR 10/22/2017 1745   LABSPEC 1.008 10/22/2017 1745   PHURINE 7.0 10/22/2017 1745   GLUCOSEU NEGATIVE 10/22/2017 1745   HGBUR NEGATIVE 10/22/2017 1745   BILIRUBINUR NEGATIVE 10/22/2017 1745   KETONESUR NEGATIVE 10/22/2017 1745   PROTEINUR NEGATIVE 10/22/2017 1745   NITRITE NEGATIVE 10/22/2017 1745   LEUKOCYTESUR NEGATIVE 10/22/2017 1745   Sepsis Labs: @LABRCNTIP (procalcitonin:4,lacticidven:4) )No results found for this or any previous visit (from the past 240 hour(s)).   Radiological Exams on Admission: Dg Chest 2 View  Result Date: 10/22/2017 CLINICAL DATA:  Weakness.  Edema. EXAM: CHEST  2 VIEW COMPARISON:  10/09/2017 FINDINGS: The cardiac silhouette is normal in  size. No mediastinal or hilar masses. Airspace opacity extends laterally from the right hilum into the right upper lobe. There is irregular interstitial thickening bilaterally. Patchy hazy airspace opacities in noted more diffusely primarily in both upper lobes. This appearance is similar to the most recent prior exam. No pleural effusion.  No pneumothorax. Skeletal structures are demineralized but grossly intact. IMPRESSION: 1. Bilateral airspace and interstitial opacities, most evident in  the upper lobes, similar to the most recent prior exam. The findings have improved on the right when compared to a CT scan dated 08/07/2017. Residual lung opacities may be due to infection or inflammation. Edema is felt less likely particularly in the absence of pleural effusions. Electronically Signed   By: Lajean Manes M.D.   On: 10/22/2017 17:26   Dg Ankle Complete Right  Result Date: 10/22/2017 CLINICAL DATA:  Fall twisted ankle EXAM: RIGHT ANKLE - COMPLETE 3+ VIEW COMPARISON:  None. FINDINGS: There is no evidence of fracture, dislocation, or joint effusion. There is no evidence of arthropathy or other focal bone abnormality. Soft tissues are unremarkable. Calcaneal spurring plantar surface IMPRESSION: Negative. Electronically Signed   By: Franchot Gallo M.D.   On: 10/22/2017 14:59   Ct Head Wo Contrast  Result Date: 10/22/2017 CLINICAL DATA:  Frequent falls over the last 2 weeks. History of lung carcinoma. EXAM: CT HEAD WITHOUT CONTRAST TECHNIQUE: Contiguous axial images were obtained from the base of the skull through the vertex without intravenous contrast. COMPARISON:  None. FINDINGS: Brain: No evidence of acute infarction, hemorrhage, hydrocephalus, extra-axial collection or mass lesion/mass effect. There is ventricular and sulcal enlargement reflecting mild generalized atrophy. Vascular: No hyperdense vessel or unexpected calcification. Skull: Normal. Negative for fracture or focal lesion. Sinuses/Orbits:  Globes and orbits are unremarkable. There is mild ethmoid and minor maxillary sinus mucosal thickening. Remaining sinuses and mastoid air cells are clear. Other: None. IMPRESSION: 1. No acute intracranial abnormalities. 2. Mild generalized atrophy. Electronically Signed   By: Lajean Manes M.D.   On: 10/22/2017 17:28   Dg Foot Complete Right  Result Date: 10/22/2017 CLINICAL DATA:  Fall.  Twisted ankle EXAM: RIGHT FOOT COMPLETE - 3+ VIEW COMPARISON:  None. FINDINGS: There is no evidence of fracture or dislocation. There is no evidence of arthropathy or other focal bone abnormality. Soft tissues are unremarkable. IMPRESSION: Negative. Electronically Signed   By: Franchot Gallo M.D.   On: 10/22/2017 15:00     EKG: Independently reviewed. Sinus rhythm, QTC 474, early R progression, low voltage, nonspecific T-wave change.  Assessment/Plan Principal Problem:   Acute CHF (congestive heart failure) (HCC) Active Problems:   Adenocarcinoma of right lung, stage 1 (HCC)   Microcytic anemia   Type 2 diabetes mellitus without complication (HCC)   Foot drop   Facial droop   NSTEMI (non-ST elevated myocardial infarction) (HCC)   Fall   Leukocytosis   Radiation pneumonitis (HCC)   Acute CHF (congestive heart failure) ([place]): Patient has elevated BNP, 3+ leg edema, positive JVD, consistent with acute CHF. Given her long history of hypertension, patient may have acute diastolic CHF. No 2d echo on record in Epic.  -will admit to tele bed as inpt. -Lasix 40 mg daily by IV -2d echo - ASA  -Daily weights -strict I/O's -Low salt diet -consult to CM and SW  NSTEMI (non-ST elevated myocardial infarction) ([place]): trop 1.77. No chest pain. Dr. Terrence Dupont was consulted. -Pending MRI of brain due possible stroke. If no large stroke, will discuss with Dr. Lorraine Lax of neuro for possibly starting IV hepain -ASA per rectal. -pt cannot take oral med since she did not pass swallowing screen, will not give lipitor -prn  NTG and morphine -IV metoprolol 5 mg tid with holding parameter for blood pressure and heart rate -f/u Dr. Zenia Resides recommendations.  Addendum: per dr. Arrie Eastern note, MRI showed no stroke and OK to start IV heparin. -start IV heparin now  Facial droop and possible  stroke: Patient states that she noted left facial droop one month ago. CT head is negative for acute intracranial abnormalities. Patient did not pass swallowing screen. Dr. Lorraine Lax of neuro was consulted.  -will get stat MRI now -f/u neuro's recommendations. -Aspirin Rectum -Pt/ot -SLP  Adenocarcinoma of right lung, stage 1 ([place]): s/p of XRT -f/u with dr. Julien Nordmann  Microcytic anemia: Hemoglobin slightly dropped from 8.4 on 11/60/18-->7.7 -f/u by CBC -hold iron supplement since patient did not past swallowing screen.  Type 2 diabetes mellitus without complication ([place]): Last A1c 6.6, well controled. Patient is taking metformin and glipizide at home -SSI -Check A1c  Right Foot drop: Etiology is not clear. -PT/OT -Follow-up neurologist recommendation    Fall: CT head negative. X-ray of the right ankle and right foot is negative for bony fracture. -When necessary Tylenol  Leukocytosis: WBC 23.8. No fever, does not seem to have infection. -Follow-up CBC -Get blood culture  Radiation pneumonitis [facility]): CXR is abnormal. Pt has mild SOB and mild cough. -prn albuterol nebulizers and prn mucinex for cough    DVT ppx: on IV heparin Code Status: Full code Family Communication: None at bed side.   Disposition Plan:  Anticipate discharge back to previous home environment Consults called:   Admission status:  Inpatient/tele       Date of Service 10/23/2017    Ivor Costa [facility] Pager [phone]  If 7PM-7AM, please contact night-coverage [email or web address] Password [email or web address] 10/23/2017, 12:05 AM

## 2017-10-23 ENCOUNTER — Other Ambulatory Visit: Payer: Self-pay

## 2017-10-23 ENCOUNTER — Inpatient Hospital Stay (HOSPITAL_COMMUNITY): Payer: Medicare Other

## 2017-10-23 ENCOUNTER — Encounter (HOSPITAL_COMMUNITY): Payer: Self-pay

## 2017-10-23 DIAGNOSIS — R609 Edema, unspecified: Secondary | ICD-10-CM

## 2017-10-23 LAB — LIPID PANEL
CHOL/HDL RATIO: 2.5 ratio
Cholesterol: 106 mg/dL (ref 0–200)
HDL: 43 mg/dL (ref >40)
LDL Cholesterol: 49 mg/dL (ref 0–99)
Triglycerides: 68 mg/dL (ref <150)
VLDL: 14 mg/dL (ref 0–40)

## 2017-10-23 LAB — BASIC METABOLIC PANEL
Anion gap: 8 (ref 5–15)
BUN: 10 mg/dL (ref 6–20)
CHLORIDE: 101 mmol/L (ref 101–111)
CO2: 25 mmol/L (ref 22–32)
CREATININE: 0.87 mg/dL (ref 0.44–1.00)
Calcium: 8.3 mg/dL — ABNORMAL LOW (ref 8.9–10.3)
GFR calc Af Amer: >60 mL/min (ref >60)
GFR calc non Af Amer: 59 mL/min — ABNORMAL LOW (ref >60)
GLUCOSE: 77 mg/dL (ref 65–99)
POTASSIUM: 3.3 mmol/L — AB (ref 3.5–5.1)
Sodium: 134 mmol/L — ABNORMAL LOW (ref 135–145)

## 2017-10-23 LAB — CBC
HEMATOCRIT: 25.7 % — AB (ref 36.0–46.0)
HEMOGLOBIN: 7.7 g/dL — AB (ref 12.0–15.0)
MCH: 23 pg — AB (ref 26.0–34.0)
MCHC: 30 g/dL (ref 30.0–36.0)
MCV: 76.7 fL — AB (ref 78.0–100.0)
Platelets: 367 10*3/uL (ref 150–400)
RBC: 3.35 MIL/uL — ABNORMAL LOW (ref 3.87–5.11)
RDW: 21.5 % — ABNORMAL HIGH (ref 11.5–15.5)
WBC: 21.3 10*3/uL — ABNORMAL HIGH (ref 4.0–10.5)

## 2017-10-23 LAB — HEMOGLOBIN A1C
Hgb A1c MFr Bld: 4.8 % (ref 4.8–5.6)
Mean Plasma Glucose: 91.06 mg/dL

## 2017-10-23 LAB — POC OCCULT BLOOD, ED: FECAL OCCULT BLD: NEGATIVE

## 2017-10-23 LAB — LACTIC ACID, PLASMA: LACTIC ACID, VENOUS: 0.8 mmol/L (ref 0.5–1.9)

## 2017-10-23 LAB — CBG MONITORING, ED
GLUCOSE-CAPILLARY: 61 mg/dL — AB (ref 65–99)
GLUCOSE-CAPILLARY: 110 mg/dL — AB (ref 65–99)
Glucose-Capillary: 70 mg/dL (ref 65–99)
Glucose-Capillary: 74 mg/dL (ref 65–99)

## 2017-10-23 LAB — TROPONIN I
TROPONIN I: 1.9 ng/mL — AB (ref <0.03)
TROPONIN I: 2.08 ng/mL — AB (ref <0.03)
Troponin I: 2.01 ng/mL (ref <0.03)

## 2017-10-23 LAB — GLUCOSE, CAPILLARY
GLUCOSE-CAPILLARY: 132 mg/dL — AB (ref 65–99)
GLUCOSE-CAPILLARY: 135 mg/dL — AB (ref 65–99)

## 2017-10-23 LAB — HEPARIN LEVEL (UNFRACTIONATED)

## 2017-10-23 LAB — PROCALCITONIN

## 2017-10-23 MED ORDER — POTASSIUM CHLORIDE CRYS ER 20 MEQ PO TBCR
40.0000 meq | EXTENDED_RELEASE_TABLET | Freq: Once | ORAL | Status: DC
  Filled 2017-10-23: qty 2

## 2017-10-23 MED ORDER — ATORVASTATIN CALCIUM 40 MG PO TABS
40.0000 mg | ORAL_TABLET | Freq: Every day | ORAL | Status: DC
  Administered 2017-10-23 – 2017-10-24 (×2): 40 mg via ORAL
  Filled 2017-10-23 (×3): qty 1

## 2017-10-23 MED ORDER — HEPARIN BOLUS VIA INFUSION
3500.0000 [IU] | Freq: Once | INTRAVENOUS | Status: DC
  Filled 2017-10-23: qty 3500

## 2017-10-23 MED ORDER — FERROUS SULFATE 325 (65 FE) MG PO TABS
325.0000 mg | ORAL_TABLET | Freq: Every day | ORAL | Status: DC
  Administered 2017-10-24 – 2017-10-25 (×2): 325 mg via ORAL
  Filled 2017-10-23 (×2): qty 1

## 2017-10-23 MED ORDER — MAGNESIUM HYDROXIDE 400 MG/5ML PO SUSP
30.0000 mL | Freq: Every day | ORAL | Status: DC | PRN
  Administered 2017-10-23: 30 mL via ORAL
  Filled 2017-10-23: qty 30

## 2017-10-23 MED ORDER — HEPARIN (PORCINE) IN NACL 100-0.45 UNIT/ML-% IJ SOLN
700.0000 [IU]/h | INTRAMUSCULAR | Status: DC
  Administered 2017-10-23: 700 [IU]/h via INTRAVENOUS
  Filled 2017-10-23: qty 250

## 2017-10-23 MED ORDER — HEPARIN BOLUS VIA INFUSION
1500.0000 [IU] | Freq: Once | INTRAVENOUS | Status: AC
  Administered 2017-10-23: 1500 [IU] via INTRAVENOUS
  Filled 2017-10-23: qty 1500

## 2017-10-23 MED ORDER — METOPROLOL TARTRATE 12.5 MG HALF TABLET
12.5000 mg | ORAL_TABLET | Freq: Two times a day (BID) | ORAL | Status: DC
  Administered 2017-10-23 – 2017-10-25 (×4): 12.5 mg via ORAL
  Filled 2017-10-23 (×4): qty 1

## 2017-10-23 MED ORDER — ENOXAPARIN SODIUM 30 MG/0.3ML ~~LOC~~ SOLN
30.0000 mg | SUBCUTANEOUS | Status: DC
  Administered 2017-10-23 – 2017-10-24 (×2): 30 mg via SUBCUTANEOUS
  Filled 2017-10-23 (×3): qty 0.3

## 2017-10-23 MED ORDER — NITROGLYCERIN 2 % TD OINT
0.5000 [in_us] | TOPICAL_OINTMENT | Freq: Four times a day (QID) | TRANSDERMAL | Status: DC
  Administered 2017-10-23 – 2017-10-25 (×8): 0.5 [in_us] via TOPICAL
  Filled 2017-10-23 (×9): qty 30
  Filled 2017-10-23 (×2): qty 1
  Filled 2017-10-23 (×20): qty 30

## 2017-10-23 NOTE — ED Notes (Signed)
Dr. Nevada Crane paged regarding pts cbg of 63 and pt being npo due to failing swallow screen

## 2017-10-23 NOTE — Progress Notes (Signed)
MRI Lumbar spine Radiology report conclusions:  1. Patchy endplate marrow edema throughout the lower lumbar spine L3-L4 through L5-S1 is associated with chronic complete disc space loss, trace fluid signal in the disc spaces, and mild psoas muscle Inflammation. However, this constellation more resembles severe degenerative changes than multilevel infectious discitis-osteomyelitis, or lumbar metastatic disease. 2. Subsequent multilevel mild lumbar spinal stenosis and up to moderate L3 through L5 neural foraminal stenosis.  MRI brain: 1. No acute intracranial abnormality identified. 2. Remote lacunar infarcts involving the right basal ganglia, left thalamus, left paramedian pons, and right cerebellum. 3. Mild for age chronic small vessel ischemic disease. 4. Degenerative spondylolysis within the upper cervical spine with mild-to-moderate stenosis at C3-4.   A/R:  1. No nerve root compression on MRI to account for the patient's foot drop. No acute stroke seen on MRI brain. 2. Will need EMG/NCS with Neurology as an outpatient to further evaluate for possible nerve entrapment syndrome or inflammatory mononeuropathy 3. AFO brace is recommended in addition to PT/OT evaluations 4. Continue ASA 5. Monitor for possible atrial fibrillation  Electronically signed: Dr. Kerney Elbe

## 2017-10-23 NOTE — Plan of Care (Signed)
  Pain Managment: General experience of comfort will improve 10/23/2017 2343 - Progressing by Riki Altes T, RN   Activity: Risk for activity intolerance will decrease 10/23/2017 2343 - Progressing by Tristan Schroeder, RN   Elimination: Will not experience complications related to bowel motility 10/23/2017 2343 - Progressing by Tristan Schroeder, RN   Safety: Ability to remain free from injury will improve 10/23/2017 2343 - Progressing by Tristan Schroeder, RN

## 2017-10-23 NOTE — ED Notes (Signed)
MRI made aware patient is ready to come over for scan, pt denies any issues with claustrophobia.

## 2017-10-23 NOTE — Progress Notes (Addendum)
ANTICOAGULATION CONSULT NOTE - Initial Consult  Pharmacy Consult for heparin  Indication: chest pain/ACS  No Known Allergies  Patient Measurements: Heparin Dosing Weight: 60.2 kg   Vital Signs: Temp: 98.2 F (36.8 C) (11/19 1743) Temp Source: Oral (11/19 1423) BP: 113/95 (11/20 0000) Pulse Rate: 81 (11/20 0000)  Labs: Recent Labs    10/22/17 1424  HGB 7.7*  HCT 25.9*  PLT 345  CREATININE 1.04*  TROPONINI 1.77*    CrCl cannot be calculated (Unknown ideal weight.).   Medical History: Past Medical History:  Diagnosis Date  . Adenocarcinoma of right lung, stage 1 ([place]) 11/16/2016  . Arthritis   . Asthma   . COPD (chronic obstructive pulmonary disease) ([place])   . Diabetes mellitus without complication ([place])    Type II  . Family history of adverse reaction to anesthesia    "aunt had PONV"  . History of radiation therapy 12/19/16-12/27/16   SBRT to right upper lobe 54 Gy in 3 fractions  . Hypercholesteremia   . Hypertension   . Osteoporosis   . Positive PPD 12/16   PER PCP  . Shortness of breath dyspnea   . Tremors of nervous system    Left hand  . Urinary frequency   . Vitamin D deficiency   . Wears glasses    Assessment: 83 yo female admitted with leg swelling, SOB, facial droop, and fall. Positive troponin, so pharmacy consulted to dose heparin. No oral anticoagulation PTA per medication list.   Neurology consulted to see patient re: facial droop/fall. Per notes, CT head is negative for acute intracranial abnormalities and MRI shows no acute stroke. Per Dr. Lorraine Lax and Dr. Blaine Hamper documentation, okay to start heparin gtt.   Hgb noted to be low at 7.7 and plts normal at 345. No active s/s bleeding per RN.   Okay for full dose heparin per Dr. Blaine Hamper. Will give conservative bolus in setting of above.   Goal of Therapy:  Heparin level 0.3-0.7 units/ml Monitor platelets by anticoagulation protocol: Yes   Plan:   Heparin bolus 1500 units IV x1 Start heparin gtt at 700  units/hr  Heparin level in 8 hrs Daily heparin level and CBC Monitor for s/s bleeding  Lavonda Jumbo, PharmD Clinical Pharmacist 10/23/17 12:39 AM

## 2017-10-23 NOTE — ED Notes (Signed)
Pt on hospital bed

## 2017-10-23 NOTE — Progress Notes (Signed)
PT Cancellation Note  Patient Details Name: Erin Carr MRN: [id number] DOB: 1930/10/18   Cancelled Treatment:    Reason Eval/Treat Not Completed: Medical issues which prohibited therapy Pt with elevated troponins and per cardiology notes, possible MI. Troponins have not yet trended down, so will hold until trending down. Will reattempt as pt medically appropriate.  Leighton Ruff, PT, DPT  Acute Rehabilitation Services  Pager: [phone]    Rudean Hitt 10/23/2017, 1:36 PM

## 2017-10-23 NOTE — Progress Notes (Signed)
PROGRESS NOTE  Erin Carr DOB: 03-15-1930 DOA: 10/22/2017 PCP: Vincente Liberty, MD  HPI/Recap of past 24 hours: Erin Carr is a 83 y.o. female with medical history significant of hypertension, hyperlipidemia, diabetes mellitus, COPD, asthma, iron deficiency anemia, right lung non-small cell cancer s/p 2 radiation treatments, radiation pneumonitis, who presents with Leg swelling, shortness breath, left facial droop, right foot drop and fall. Patient lives alone. No children. Has a sister who lives next door.  This morning the pt denies any dyspnea and is breathing comfortably on RA. She states she has not seen a cardiologist in years and had no reason to see one lately. Only complaints were of her lower extremities edema bilaterally. Right foot drop is persistent on physical exam.   Assessment/Plan: Principal Problem:   Acute CHF (congestive heart failure) (HCC) Active Problems:   Adenocarcinoma of right lung, stage 1 (HCC)   Microcytic anemia   Type 2 diabetes mellitus without complication (HCC)   Foot drop   Facial droop   NSTEMI (non-ST elevated myocardial infarction) (HCC)   Fall   Leukocytosis   Radiation pneumonitis (HCC)  Suspected Acute CHF exacerbation: -no 2D echo on file -echocardiogram ordered awaiting completion -symptoms are improving -continue IV lasix as tolerated  -monitor BP -Lasix IV 40 mg daily - ASA, atorvastatin, metoprolol -Daily weights, strict I/O's, Low salt diet  Elevated troponins:  -trop peaked at 2.08 then trended down -chest pain free -cardiology following. We appreciate recommendations -initially on heparin drip which was stopped 10/23/17 -asa, statin, beta blocker  Left Facial droop and possible stroke:  -states she noted left facial droop one month ago -CT head is negative for acute intracranial abnormalities -MRI no sign of acute findings -neurology following -speech therapist, PT/OT evaluate and  treat -fall precaution  Chronic anemia baseline hg 8.0 -hg 7.7; if less than 7 transfuse 2 U PRBCs -fobt negative -anemia workup -peripheral smear -cbc am  Adenocarcinoma of right lung, stage 1 ([place]):  -s/p radiation x2 -f/u with Dr. Julien Nordmann  Type 2 diabetes mellitus without complication ([place]):  -previous A1c 6.6 (12/515) -A1C 4.8 (10/23/17) -No SSI to prevent hypoglycemia  Right Foot drop:  -Etiology is not clear. -PT/OT -Follow-up neurologist recommendation    Fall in the setting of ambulatory dysfunction -CT head negative for acute findings -X-ray of the right ankle and right foot is negative for bony fracture. -Tylenol prn  Leukocytosis: -trending down -WBC 21k from 23.8.  -No fever, does not seem to have infection. -Follow-up CBC -blood culture no growth in 1 day -lactiac acid 0.8 (10/23/17) -procalcitonin less than 0.10 (10/23/17)  Radiation pneumonitis ([place]):  -CXR reviewed 10/22/17 RML atelectasis vs others -prn albuterol nebulizers and prn mucinex for cough   Code Status: Full  Family Communication: No family members at bedside  Disposition Plan: Will stay another midnight to continue current management   Consultants:  Cardiology  neurology  Procedures:  None  Antimicrobials:  Not indicated  DVT prophylaxis:  heparin sq 5000 U TID   Objective: Vitals:   10/23/17 0800 10/23/17 0830 10/23/17 0845 10/23/17 0900  BP: (!) 129/59 (!) 128/54 (!) 136/56 (!) 142/59  Pulse: 84 81 79 83  Resp: (!) 21 (!) 21    Temp:      TempSrc:      SpO2: 97% 97% 97% 99%  Weight:      Height:       No intake or output data in the 24 hours ending 10/23/17 0931  Filed Weights   10/23/17 0015  Weight: 61.2 kg (135 lb)    Exam:   General:  82 yo AAF thin built NAD  Cardiovascular: RRR no rubs or gallops  Respiratory: Mild rales at bases. No wheezes  Abdomen: Soft NT ND NBS x4 quadrants  Musculoskeletal: right foot drop noted. Edema  noted in ankle regions bilaterally  Skin: No open lesions noted  Psychiatry: Mood is appropriate for condition and setting   Data Reviewed: CBC: Recent Labs  Lab 10/22/17 1424 10/23/17 0333  WBC 23.8* 21.3*  NEUTROABS 8.6*  --   HGB 7.7* 7.7*  HCT 25.9* 25.7*  MCV 76.9* 76.7*  PLT 345 161   Basic Metabolic Panel: Recent Labs  Lab 10/22/17 1424 10/23/17 0333  NA 132* 134*  K 3.7 3.3*  CL 100* 101  CO2 23 25  GLUCOSE 88 77  BUN 13 10  CREATININE 1.04* 0.87  CALCIUM 8.5* 8.3*   GFR: Estimated Creatinine Clearance: 39.7 mL/min (by C-G formula based on SCr of 0.87 mg/dL). Liver Function Tests: Recent Labs  Lab 10/22/17 1424  AST 33  ALT 22  ALKPHOS 165*  BILITOT 0.6  PROT 6.0*  ALBUMIN 2.4*   No results for input(s): LIPASE, AMYLASE in the last 168 hours. No results for input(s): AMMONIA in the last 168 hours. Coagulation Profile: No results for input(s): INR, PROTIME in the last 168 hours. Cardiac Enzymes: Recent Labs  Lab 10/22/17 1424 10/22/17 2134 10/23/17 0333  TROPONINI 1.77* 1.90* 2.08*   BNP (last 3 results) No results for input(s): PROBNP in the last 8760 hours. HbA1C: Recent Labs    10/23/17 0333  HGBA1C 4.8   CBG: Recent Labs  Lab 10/22/17 2150 10/23/17 0753 10/23/17 0902  GLUCAP 72 61* 110*   Lipid Profile: Recent Labs    10/23/17 0333  CHOL 106  HDL 43  LDLCALC 49  TRIG 68  CHOLHDL 2.5   Thyroid Function Tests: No results for input(s): TSH, T4TOTAL, FREET4, T3FREE, THYROIDAB in the last 72 hours. Anemia Panel: No results for input(s): VITAMINB12, FOLATE, FERRITIN, TIBC, IRON, RETICCTPCT in the last 72 hours. Urine analysis:    Component Value Date/Time   COLORURINE YELLOW 10/22/2017 1745   APPEARANCEUR CLEAR 10/22/2017 1745   LABSPEC 1.008 10/22/2017 1745   PHURINE 7.0 10/22/2017 1745   GLUCOSEU NEGATIVE 10/22/2017 1745   HGBUR NEGATIVE 10/22/2017 1745   BILIRUBINUR NEGATIVE 10/22/2017 1745   KETONESUR NEGATIVE  10/22/2017 1745   PROTEINUR NEGATIVE 10/22/2017 1745   NITRITE NEGATIVE 10/22/2017 1745   LEUKOCYTESUR NEGATIVE 10/22/2017 1745   Sepsis Labs: @LABRCNTIP (procalcitonin:4,lacticidven:4)  )No results found for this or any previous visit (from the past 240 hour(s)).    Studies: Dg Chest 2 View  Result Date: 10/22/2017 CLINICAL DATA:  Weakness.  Edema. EXAM: CHEST  2 VIEW COMPARISON:  10/09/2017 FINDINGS: The cardiac silhouette is normal in size. No mediastinal or hilar masses. Airspace opacity extends laterally from the right hilum into the right upper lobe. There is irregular interstitial thickening bilaterally. Patchy hazy airspace opacities in noted more diffusely primarily in both upper lobes. This appearance is similar to the most recent prior exam. No pleural effusion.  No pneumothorax. Skeletal structures are demineralized but grossly intact. IMPRESSION: 1. Bilateral airspace and interstitial opacities, most evident in the upper lobes, similar to the most recent prior exam. The findings have improved on the right when compared to a CT scan dated 08/07/2017. Residual lung opacities may be due to infection or inflammation. Edema  is felt less likely particularly in the absence of pleural effusions. Electronically Signed   By: Lajean Manes M.D.   On: 10/22/2017 17:26   Dg Ankle Complete Right  Result Date: 10/22/2017 CLINICAL DATA:  Fall twisted ankle EXAM: RIGHT ANKLE - COMPLETE 3+ VIEW COMPARISON:  None. FINDINGS: There is no evidence of fracture, dislocation, or joint effusion. There is no evidence of arthropathy or other focal bone abnormality. Soft tissues are unremarkable. Calcaneal spurring plantar surface IMPRESSION: Negative. Electronically Signed   By: Franchot Gallo M.D.   On: 10/22/2017 14:59   Ct Head Wo Contrast  Result Date: 10/22/2017 CLINICAL DATA:  Frequent falls over the last 2 weeks. History of lung carcinoma. EXAM: CT HEAD WITHOUT CONTRAST TECHNIQUE: Contiguous axial  images were obtained from the base of the skull through the vertex without intravenous contrast. COMPARISON:  None. FINDINGS: Brain: No evidence of acute infarction, hemorrhage, hydrocephalus, extra-axial collection or mass lesion/mass effect. There is ventricular and sulcal enlargement reflecting mild generalized atrophy. Vascular: No hyperdense vessel or unexpected calcification. Skull: Normal. Negative for fracture or focal lesion. Sinuses/Orbits: Globes and orbits are unremarkable. There is mild ethmoid and minor maxillary sinus mucosal thickening. Remaining sinuses and mastoid air cells are clear. Other: None. IMPRESSION: 1. No acute intracranial abnormalities. 2. Mild generalized atrophy. Electronically Signed   By: Lajean Manes M.D.   On: 10/22/2017 17:28   Mr Brain Wo Contrast  Result Date: 10/23/2017 CLINICAL DATA:  Initial evaluation for new onset right foot drop. EXAM: MRI HEAD WITHOUT CONTRAST TECHNIQUE: Multiplanar, multiecho pulse sequences of the brain and surrounding structures were obtained without intravenous contrast. COMPARISON:  [facility] CT from earlier the same day. FINDINGS: Brain: Diffuse prominence of the CSF containing spaces compatible with generalized age related cerebral atrophy. Mild patchy and confluent T2/FLAIR hyperintensity within the periventricular and deep white matter both cerebral hemispheres, most consistent with chronic small vessel ischemic disease, mild for age. Remote lacunar infarcts present within the right basal ganglia, left thalamus, left paramedian pons, and right cerebellum. Chronic microhemorrhage associated with the chronic right cerebellar infarct noted. No abnormal foci of restricted diffusion to suggest acute or subacute ischemia. Faint punctate diffusion abnormality within the cortical gray matter of the right occipital lobe on axial DWI sequence not seen on corresponding sequences, and favored to be artifactual. Gray-white matter differentiation well  maintained. No other areas of chronic infarction. No other evidence for chronic infarction. No mass lesion, midline shift or mass effect. No hydrocephalus. No extra-axial fluid collection. Major dural sinuses are grossly patent. No made of a partially empty sella. Midline structures intact and normal. Vascular: Major intracranial flow voids are maintained. Skull and upper cervical spine: Degenerative changes about the C1-2 articulation with thickening of the tectorial membrane, resulting in mild spinal stenosis at the craniocervical junction. Gender spondylolysis at C3-4 with resultant mild to moderate spinal stenosis. Bone marrow signal intensity within normal limits. No scalp soft tissue abnormality. Sinuses/Orbits: Globes and orbital soft tissues within normal limits. Patient status post lens extraction on the right. Scattered mucosal thickening throughout the paranasal sinuses. No air-fluid level to suggest acute sinusitis. Mastoid air cells are clear. Inner ear structures normal. Other: None. IMPRESSION: 1. No acute intracranial abnormality identified. 2. Remote lacunar infarcts involving the right basal ganglia, left thalamus, left paramedian pons, and right cerebellum. 3. Mild for age chronic small vessel ischemic disease. 4. Degenerate spondylolysis within the upper cervical spine with mild-to-moderate stenosis at C3-4. Follow-up examination with dedicated MRI of the  cervical spine could be performed for further evaluation as clinically warranted. Electronically Signed   By: Jeannine Boga M.D.   On: 10/23/2017 00:05   Dg Foot Complete Right  Result Date: 10/22/2017 CLINICAL DATA:  Fall.  Twisted ankle EXAM: RIGHT FOOT COMPLETE - 3+ VIEW COMPARISON:  None. FINDINGS: There is no evidence of fracture or dislocation. There is no evidence of arthropathy or other focal bone abnormality. Soft tissues are unremarkable. IMPRESSION: Negative. Electronically Signed   By: Franchot Gallo M.D.   On: 10/22/2017  15:00    Scheduled Meds: . aspirin  300 mg Rectal Daily  . atorvastatin  40 mg Oral q1800  . furosemide  40 mg Intravenous Daily  . insulin aspart  0-5 Units Subcutaneous QHS  . insulin aspart  0-9 Units Subcutaneous TID WC  . latanoprost  1 drop Both Eyes QHS  . metoprolol tartrate  12.5 mg Oral BID  . nitroGLYCERIN  0.5 inch Topical Q6H  . sodium chloride flush  3 mL Intravenous Q12H    Continuous Infusions: . sodium chloride    . heparin 700 Units/hr (10/23/17 0121)     LOS: 1 day     Kayleen Memos, MD [facility] Pager [phone]  If 7PM-7AM, please contact night-coverage [email or web address] Password [facility] 10/23/2017, 9:31 AM

## 2017-10-23 NOTE — Plan of Care (Signed)
  Progressing Education: Knowledge of General Education information will improve 10/23/2017 1433 - Progressing by Imagene Gurney, RN Nutrition: Adequate nutrition will be maintained 10/23/2017 1433 - Progressing by Imagene Gurney, RN Pain Managment: General experience of comfort will improve 10/23/2017 1433 - Progressing by Imagene Gurney, RN Safety: Ability to remain free from injury will improve 10/23/2017 1433 - Progressing by Imagene Gurney, RN Skin Integrity: Risk for impaired skin integrity will decrease 10/23/2017 1433 - Progressing by Imagene Gurney, RN

## 2017-10-23 NOTE — ED Notes (Signed)
Patient transported to MRI 

## 2017-10-23 NOTE — Evaluation (Signed)
Clinical/Bedside Swallow Evaluation Patient Details  Name: Erin Carr MRN: [id number] Date of Birth: 25-Nov-1930  Today's Date: 10/23/2017 Time: SLP Start Time (ACUTE ONLY): 1452 SLP Stop Time (ACUTE ONLY): 1500 SLP Time Calculation (min) (ACUTE ONLY): 8 min  Past Medical History:  Past Medical History:  Diagnosis Date  . Adenocarcinoma of right lung, stage 1 ([place]) 11/16/2016  . Arthritis   . Asthma   . COPD (chronic obstructive pulmonary disease) ([place])   . Diabetes mellitus without complication ([place])    Type II  . Family history of adverse reaction to anesthesia    "aunt had PONV"  . History of radiation therapy 12/19/16-12/27/16   SBRT to right upper lobe 54 Gy in 3 fractions  . Hypercholesteremia   . Hypertension   . Osteoporosis   . Positive PPD 12/16   PER PCP  . Shortness of breath dyspnea   . Tremors of nervous system    Left hand  . Urinary frequency   . Vitamin D deficiency   . Wears glasses    Past Surgical History:  Past Surgical History:  Procedure Laterality Date  . ABDOMINAL HYSTERECTOMY    . ANTERIOR AND POSTERIOR REPAIR N/A 12/16/2014   Procedure: ANTERIOR (CYSTOCELE) AND POSTERIOR REPAIR (RECTOCELE);  Surgeon: Crawford Givens, MD;  Location: [facility];  Service: Gynecology;  Laterality: N/A;  . APPENDECTOMY    . BOWEL RESECTION    . BREAST BIOPSY Left   . COLONOSCOPY    . VIDEO BRONCHOSCOPY WITH ENDOBRONCHIAL ULTRASOUND N/A 12/17/2015   Procedure: VIDEO BRONCHOSCOPY WITH ENDOBRONCHIAL ULTRASOUND;  Surgeon: Ivin Poot, MD;  Location: [facility];  Service: Thoracic;  Laterality: N/A;   HPI:  Erin Carr is a 83 y.o. female with medical history significant of hypertension, hyperlipidemia, diabetes mellitus, COPD, asthma, iron deficiency anemia, right lung non-small cell cancer (s/p of XTR), radiation pneumonitis, who presents with Leg swelling, shortness breath, left facial droop, right drop and fall. CXR showed: Bilateral airspace and interstitial  opacities, most evident in the upper lobes, similar to the most recent prior exam. The findings have improved on the right when compared to a CT scan dated 08/07/2017. Residual lung opacities may be due to infection or inflammation. Edema is felt less likely particularly in the absence of pleural effusions.    Assessment / Plan / Recommendation Clinical Impression   Pt presents with soft s/s of airway invasion at bedside characterized by delayed coughing following large consecutive boluses of thin liquids via straw.  Pt could attribute these coughing episodes to talking while drinking and symptoms were mitigated when pt stopped talking during PO intake.  Pt reports an ~1 year long history of "sometimes getting strangled with meals."  Suspect presbyphagia versus acute dysphagia; however, will follow up for toleration while inpatient to ensure safety with PO intake.  Do not anticipate ST needs post discharge.   SLP Visit Diagnosis: Dysphagia, unspecified (R13.10)    Aspiration Risk  Mild aspiration risk    Diet Recommendation Regular;Thin liquid   Liquid Administration via: Cup;Straw Medication Administration: Whole meds with liquid Supervision: Patient able to self feed Compensations: Slow rate;Small sips/bites;Minimize environmental distractions Postural Changes: Seated upright at 90 degrees    Other  Recommendations Oral Care Recommendations: Oral care BID   Follow up Recommendations None      Frequency and Duration min 1 x/week          Prognosis Prognosis for Safe Diet Advancement: Good      Swallow Study  General Date of Onset: (see HPI) HPI: Erin Carr is a 83 y.o. female with medical history significant of hypertension, hyperlipidemia, diabetes mellitus, COPD, asthma, iron deficiency anemia, right lung non-small cell cancer (s/p of XTR), radiation pneumonitis, who presents with Leg swelling, shortness breath, left facial droop, right drop and fall. CXR showed: Bilateral  airspace and interstitial opacities, most evident in the upper lobes, similar to the most recent prior exam. The findings have improved on the right when compared to a CT scan dated 08/07/2017. Residual lung opacities may be due to infection or inflammation. Edema is felt less likely particularly in the absence of pleural effusions.  Type of Study: Bedside Swallow Evaluation Previous Swallow Assessment: none on record Diet Prior to this Study: Regular;Thin liquids Temperature Spikes Noted: No Respiratory Status: Room air History of Recent Intubation: No Behavior/Cognition: Alert;Cooperative;Pleasant mood Oral Cavity Assessment: Within Functional Limits Oral Care Completed by SLP: No Oral Cavity - Dentition: Adequate natural dentition(no bottom teeth) Vision: Functional for self-feeding Self-Feeding Abilities: Able to feed self Patient Positioning: Upright in bed Baseline Vocal Quality: Normal Volitional Cough: Strong Volitional Swallow: Able to elicit    Oral/Motor/Sensory Function     Ice Chips     Thin Liquid Thin Liquid: Impaired Presentation: Straw Pharyngeal  Phase Impairments: Cough - Delayed    Nectar Thick     Honey Thick     Puree     Solid   GO   Solid: Within functional limits        Erin Carr, Erin Carr 10/23/2017,3:13 PM

## 2017-10-23 NOTE — Progress Notes (Signed)
Subjective:  Patient denies any chest pain states breathing and leg swelling has improved. Hemoglobin remains low but stable white count still markedly elevated  Objective:  Vital Signs in the last 24 hours: Temp:  [98.2 F (36.8 C)] 98.2 F (36.8 C) (11/19 1743) Pulse Rate:  [67-86] 86 (11/20 0945) Resp:  [14-22] 21 (11/20 0830) BP: (100-158)/(41-95) 118/41 (11/20 0945) SpO2:  [94 %-100 %] 98 % (11/20 0945) Weight:  [61.2 kg (135 lb)] 61.2 kg (135 lb) (11/20 0015)  Intake/Output from previous day: No intake/output data recorded. Intake/Output from this shift: No intake/output data recorded.  Physical Exam: Neck: no adenopathy, no carotid bruit, no JVD and supple, symmetrical, trachea midline Lungs: bilateral occasional rhonchi and faint rales noted Heart: regular rate and rhythm, S1, S2 normal and soft systolic murmur and S3 gallop noted Abdomen: soft, non-tender; bowel sounds normal; no masses,  no organomegaly Extremities: no clubbing cyanosis trace edema noted  Lab Results: Recent Labs    10/22/17 1424 10/23/17 0333  WBC 23.8* 21.3*  HGB 7.7* 7.7*  PLT 345 367   Recent Labs    10/22/17 1424 10/23/17 0333  NA 132* 134*  K 3.7 3.3*  CL 100* 101  CO2 23 25  GLUCOSE 88 77  BUN 13 10  CREATININE 1.04* 0.87   Recent Labs    10/23/17 0333 10/23/17 0930  TROPONINI 2.08* 2.01*   Hepatic Function Panel Recent Labs    10/22/17 1424  PROT 6.0*  ALBUMIN 2.4*  AST 33  ALT 22  ALKPHOS 165*  BILITOT 0.6   Recent Labs    10/23/17 0333  CHOL 106   No results for input(s): PROTIME in the last 72 hours.  Imaging: Imaging results have been reviewed and Dg Chest 2 View  Result Date: 10/22/2017 CLINICAL DATA:  Weakness.  Edema. EXAM: CHEST  2 VIEW COMPARISON:  10/09/2017 FINDINGS: The cardiac silhouette is normal in size. No mediastinal or hilar masses. Airspace opacity extends laterally from the right hilum into the right upper lobe. There is irregular  interstitial thickening bilaterally. Patchy hazy airspace opacities in noted more diffusely primarily in both upper lobes. This appearance is similar to the most recent prior exam. No pleural effusion.  No pneumothorax. Skeletal structures are demineralized but grossly intact. IMPRESSION: 1. Bilateral airspace and interstitial opacities, most evident in the upper lobes, similar to the most recent prior exam. The findings have improved on the right when compared to a CT scan dated 08/07/2017. Residual lung opacities may be due to infection or inflammation. Edema is felt less likely particularly in the absence of pleural effusions. Electronically Signed   By: Lajean Manes M.D.   On: 10/22/2017 17:26   Dg Ankle Complete Right  Result Date: 10/22/2017 CLINICAL DATA:  Fall twisted ankle EXAM: RIGHT ANKLE - COMPLETE 3+ VIEW COMPARISON:  None. FINDINGS: There is no evidence of fracture, dislocation, or joint effusion. There is no evidence of arthropathy or other focal bone abnormality. Soft tissues are unremarkable. Calcaneal spurring plantar surface IMPRESSION: Negative. Electronically Signed   By: Franchot Gallo M.D.   On: 10/22/2017 14:59   Ct Head Wo Contrast  Result Date: 10/22/2017 CLINICAL DATA:  Frequent falls over the last 2 weeks. History of lung carcinoma. EXAM: CT HEAD WITHOUT CONTRAST TECHNIQUE: Contiguous axial images were obtained from the base of the skull through the vertex without intravenous contrast. COMPARISON:  None. FINDINGS: Brain: No evidence of acute infarction, hemorrhage, hydrocephalus, extra-axial collection or mass lesion/mass effect. There  is ventricular and sulcal enlargement reflecting mild generalized atrophy. Vascular: No hyperdense vessel or unexpected calcification. Skull: Normal. Negative for fracture or focal lesion. Sinuses/Orbits: Globes and orbits are unremarkable. There is mild ethmoid and minor maxillary sinus mucosal thickening. Remaining sinuses and mastoid air cells  are clear. Other: None. IMPRESSION: 1. No acute intracranial abnormalities. 2. Mild generalized atrophy. Electronically Signed   By: Lajean Manes M.D.   On: 10/22/2017 17:28   Mr Brain Wo Contrast  Result Date: 10/23/2017 CLINICAL DATA:  Initial evaluation for new onset right foot drop. EXAM: MRI HEAD WITHOUT CONTRAST TECHNIQUE: Multiplanar, multiecho pulse sequences of the brain and surrounding structures were obtained without intravenous contrast. COMPARISON:  [facility] CT from earlier the same day. FINDINGS: Brain: Diffuse prominence of the CSF containing spaces compatible with generalized age related cerebral atrophy. Mild patchy and confluent T2/FLAIR hyperintensity within the periventricular and deep white matter both cerebral hemispheres, most consistent with chronic small vessel ischemic disease, mild for age. Remote lacunar infarcts present within the right basal ganglia, left thalamus, left paramedian pons, and right cerebellum. Chronic microhemorrhage associated with the chronic right cerebellar infarct noted. No abnormal foci of restricted diffusion to suggest acute or subacute ischemia. Faint punctate diffusion abnormality within the cortical gray matter of the right occipital lobe on axial DWI sequence not seen on corresponding sequences, and favored to be artifactual. Gray-white matter differentiation well maintained. No other areas of chronic infarction. No other evidence for chronic infarction. No mass lesion, midline shift or mass effect. No hydrocephalus. No extra-axial fluid collection. Major dural sinuses are grossly patent. No made of a partially empty sella. Midline structures intact and normal. Vascular: Major intracranial flow voids are maintained. Skull and upper cervical spine: Degenerative changes about the C1-2 articulation with thickening of the tectorial membrane, resulting in mild spinal stenosis at the craniocervical junction. Gender spondylolysis at C3-4 with resultant mild to  moderate spinal stenosis. Bone marrow signal intensity within normal limits. No scalp soft tissue abnormality. Sinuses/Orbits: Globes and orbital soft tissues within normal limits. Patient status post lens extraction on the right. Scattered mucosal thickening throughout the paranasal sinuses. No air-fluid level to suggest acute sinusitis. Mastoid air cells are clear. Inner ear structures normal. Other: None. IMPRESSION: 1. No acute intracranial abnormality identified. 2. Remote lacunar infarcts involving the right basal ganglia, left thalamus, left paramedian pons, and right cerebellum. 3. Mild for age chronic small vessel ischemic disease. 4. Degenerate spondylolysis within the upper cervical spine with mild-to-moderate stenosis at C3-4. Follow-up examination with dedicated MRI of the cervical spine could be performed for further evaluation as clinically warranted. Electronically Signed   By: Jeannine Boga M.D.   On: 10/23/2017 00:05   Dg Foot Complete Right  Result Date: 10/22/2017 CLINICAL DATA:  Fall.  Twisted ankle EXAM: RIGHT FOOT COMPLETE - 3+ VIEW COMPARISON:  None. FINDINGS: There is no evidence of fracture or dislocation. There is no evidence of arthropathy or other focal bone abnormality. Soft tissues are unremarkable. IMPRESSION: Negative. Electronically Signed   By: Franchot Gallo M.D.   On: 10/22/2017 15:00    Cardiac Studies:  Assessment/Plan:  Probable small non-Q-wave myocardial infarction with atypical presentation Mild acute congestive heart failure Possible bilateral radiation-induced pneumonitis Marked leukocytosis questionable etiology Acute on chronic anemia questionable etiology Hypertension Diabetes mellitus Hyperlipidemia History of lacunar infarcts in the past History of adenocarcinoma of right lung status post radiation COPD History of bronchial asthma Plan DC heparin Lovenox for DVT prophylaxis Check labs in a.m.  Medical treatment for now until pulmonary  and hematology issues are resolved. Discussed with patient and agrees   LOS: 1 day    Erin Carr 10/23/2017, 11:10 AM

## 2017-10-23 NOTE — Clinical Social Work Note (Signed)
Clinical Social Work Assessment  Patient Details  Name: Erin Carr MRN: [id number] Date of Birth: Oct 13, 1930  Date of referral:  10/23/17               Reason for consult:  Facility Placement                Permission sought to share information with:  Facility Art therapist granted to share information::  Yes, Verbal Permission Granted  Name::        Agency::     Relationship::     Contact Information:     Housing/Transportation Living arrangements for the past 2 months:  Single Family Home Source of Information:  Patient Patient Interpreter Needed:  None Criminal Activity/Legal Involvement Pertinent to Current Situation/Hospitalization:    Significant Relationships:  Siblings Lives with:  Self Do you feel safe going back to the place where you live?  Yes Need for family participation in patient care:  Yes (Comment)  Care giving concerns:  Pt stated that she is living alone. At this time no concerns were presented to CSW.   Social Worker assessment / plan:  CSW was consulted for SNF placement. CSW discussed with pt potential discharge plans. Pt stated she would prefer home health care instead of SNF placement. Pt expressed supports include sister who lives close by. Sister is the only support for the pt at this time.   Employment status:  Retired Forensic scientist:  Medicare PT Recommendations:  Not assessed at this time [place] / Referral to community resources:  [place]  Patient/Family's Response to care:  Pt was agreeable to the plan of care.   Patient/Family's Understanding of and Emotional Response to Diagnosis, Current Treatment, and Prognosis:  Pt is understandable and agreeable to plan of care. No further questions or concerns presented to CSW.   Emotional Assessment Appearance:  Appears stated age Attitude/Demeanor/Rapport:    Affect (typically observed):  Accepting, Calm, Pleasant, Appropriate Orientation:   Oriented to Self, Oriented to Situation, Oriented to Place, Oriented to  Time Alcohol / Substance use:    Psych involvement (Current and /or in the community):  No (Comment)  Discharge Needs  Concerns to be addressed:  No discharge needs identified Readmission within the last 30 days:  No Current discharge risk:  None Barriers to Discharge:  No Barriers Identified   Wetzel Bjornstad, [place] 10/23/2017, 9:57 AM

## 2017-10-23 NOTE — Consult Note (Signed)
Reason for Consult: Elevated troponin I Referring Physician: [facility] hospitalist  Erin Carr is an 83 y.o. female.  HPI: Patient is 83 year old female with past medical history significant for multiple medical problems i.e. hypertension, diabetes mellitus, hyperlipidemia, adenocarcinoma of right lung status post radiation therapy, history of radiation pneumonitis, COPD, asthma, degenerative joint disease, chronic anemia questionable etiology came to the ER complaining of bilateral leg swelling associated with shortness of breath and coughing and was noted to have marked leukocytosis. Cardiologic consultation was called as patient was noted to have elevated troponin I of 1.77. EKG done in the ED showed normal sinus rhythm with early transition in anterior leads and minor ST-T wave changes in inferolateral leads. Patient denies any chest pain but complains of exertional dyspnea with minimal exertion associated with feeling tired fatigued and no energy. Denies any cardiac workup in the past patient also noted to have progressive drop in hemoglobin in last few months etiology unclear.  Past Medical History:  Diagnosis Date  . Adenocarcinoma of right lung, stage 1 ([place]) 11/16/2016  . Arthritis   . Asthma   . COPD (chronic obstructive pulmonary disease) ([place])   . Diabetes mellitus without complication ([place])    Type II  . Family history of adverse reaction to anesthesia    "aunt had PONV"  . History of radiation therapy 12/19/16-12/27/16   SBRT to right upper lobe 54 Gy in 3 fractions  . Hypercholesteremia   . Hypertension   . Osteoporosis   . Positive PPD 12/16   PER PCP  . Shortness of breath dyspnea   . Tremors of nervous system    Left hand  . Urinary frequency   . Vitamin D deficiency   . Wears glasses     Past Surgical History:  Procedure Laterality Date  . ABDOMINAL HYSTERECTOMY    . ANTERIOR (CYSTOCELE) AND POSTERIOR REPAIR (RECTOCELE) N/A 12/16/2014   Performed by Crawford Givens,  MD at [facility] ORS  . APPENDECTOMY    . BOWEL RESECTION    . BREAST BIOPSY Left   . COLONOSCOPY    . VIDEO BRONCHOSCOPY WITH ENDOBRONCHIAL ULTRASOUND N/A 12/17/2015   Performed by Ivin Poot, MD at [facility] OR    Family History  Problem Relation Age of Onset  . Hypertension Father   . Diabetes Father     Social History:  reports that she quit smoking about 15 years ago. Her smoking use included cigarettes. She has a 20.00 pack-year smoking history. she has never used smokeless tobacco. She reports that she does not drink alcohol or use drugs.  Allergies: No Known Allergies  Medications: I have reviewed the patient's current medications.  Results for orders placed or performed during the hospital encounter of 10/22/17 (from the past 48 hour(s))  Comprehensive metabolic panel     Status: Abnormal   Collection Time: 10/22/17  2:24 PM  Result Value Ref Range   Sodium 132 (L) 135 - 145 mmol/L   Potassium 3.7 3.5 - 5.1 mmol/L   Chloride 100 (L) 101 - 111 mmol/L   CO2 23 22 - 32 mmol/L   Glucose, Bld 88 65 - 99 mg/dL   BUN 13 6 - 20 mg/dL   Creatinine, Ser 1.04 (H) 0.44 - 1.00 mg/dL   Calcium 8.5 (L) 8.9 - 10.3 mg/dL   Total Protein 6.0 (L) 6.5 - 8.1 g/dL   Albumin 2.4 (L) 3.5 - 5.0 g/dL   AST 33 15 - 41 U/L   ALT 22 14 -  54 U/L   Alkaline Phosphatase 165 (H) 38 - 126 U/L   Total Bilirubin 0.6 0.3 - 1.2 mg/dL   GFR calc non Af Amer 48 (L) >60 mL/min   GFR calc Af Amer 55 (L) >60 mL/min    Comment: (NOTE) The eGFR has been calculated using the CKD EPI equation. This calculation has not been validated in all clinical situations. eGFR's persistently <60 mL/min signify possible Chronic Kidney Disease.    Anion gap 9 5 - 15  CBC with Differential     Status: Abnormal   Collection Time: 10/22/17  2:24 PM  Result Value Ref Range   WBC 23.8 (H) 4.0 - 10.5 K/uL   RBC 3.37 (L) 3.87 - 5.11 MIL/uL   Hemoglobin 7.7 (L) 12.0 - 15.0 g/dL   HCT 25.9 (L) 36.0 - 46.0 %   MCV 76.9 (L) 78.0 -  100.0 fL   MCH 22.8 (L) 26.0 - 34.0 pg   MCHC 29.7 (L) 30.0 - 36.0 g/dL   RDW 21.5 (H) 11.5 - 15.5 %   Platelets 345 150 - 400 K/uL   Neutrophils Relative % 36 %   Lymphocytes Relative 6 %   Monocytes Relative 3 %   Eosinophils Relative 55 %   Basophils Relative 0 %   Band Neutrophils 0 %   Metamyelocytes Relative 0 %   Myelocytes 0 %   Promyelocytes Absolute 0 %   Blasts 0 %   nRBC 0 0 /100 WBC   Other 0 %   Neutro Abs 8.6 (H) 1.7 - 7.7 K/uL   Lymphs Abs 1.4 0.7 - 4.0 K/uL   Monocytes Absolute 0.7 0.1 - 1.0 K/uL   Eosinophils Absolute 13.1 (H) 0.0 - 0.7 K/uL   Basophils Absolute 0.0 0.0 - 0.1 K/uL   RBC Morphology POLYCHROMASIA PRESENT   Troponin I     Status: Abnormal   Collection Time: 10/22/17  2:24 PM  Result Value Ref Range   Troponin I 1.77 (HH) <0.03 ng/mL    Comment: CRITICAL RESULT CALLED TO, READ BACK BY AND VERIFIED WITH: WAGNER,N RN 10/22/2017 1804 JORDANS   Brain natriuretic peptide     Status: Abnormal   Collection Time: 10/22/17  2:24 PM  Result Value Ref Range   B Natriuretic Peptide 605.5 (H) 0.0 - 100.0 pg/mL  Urinalysis, Routine w reflex microscopic     Status: None   Collection Time: 10/22/17  5:45 PM  Result Value Ref Range   Color, Urine YELLOW YELLOW   APPearance CLEAR CLEAR   Specific Gravity, Urine 1.008 1.005 - 1.030   pH 7.0 5.0 - 8.0   Glucose, UA NEGATIVE NEGATIVE mg/dL   Hgb urine dipstick NEGATIVE NEGATIVE   Bilirubin Urine NEGATIVE NEGATIVE   Ketones, ur NEGATIVE NEGATIVE mg/dL   Protein, ur NEGATIVE NEGATIVE mg/dL   Nitrite NEGATIVE NEGATIVE   Leukocytes, UA NEGATIVE NEGATIVE  CBG monitoring, ED     Status: None   Collection Time: 10/22/17  9:50 PM  Result Value Ref Range   Glucose-Capillary 72 65 - 99 mg/dL   Comment 1 Notify RN    Comment 2 Document in Chart     Dg Chest 2 View  Result Date: 10/22/2017 CLINICAL DATA:  Weakness.  Edema. EXAM: CHEST  2 VIEW COMPARISON:  10/09/2017 FINDINGS: The cardiac silhouette is normal  in size. No mediastinal or hilar masses. Airspace opacity extends laterally from the right hilum into the right upper lobe. There is irregular interstitial thickening bilaterally. Patchy  hazy airspace opacities in noted more diffusely primarily in both upper lobes. This appearance is similar to the most recent prior exam. No pleural effusion.  No pneumothorax. Skeletal structures are demineralized but grossly intact. IMPRESSION: 1. Bilateral airspace and interstitial opacities, most evident in the upper lobes, similar to the most recent prior exam. The findings have improved on the right when compared to a CT scan dated 08/07/2017. Residual lung opacities may be due to infection or inflammation. Edema is felt less likely particularly in the absence of pleural effusions. Electronically Signed   By: Lajean Manes M.D.   On: 10/22/2017 17:26   Dg Ankle Complete Right  Result Date: 10/22/2017 CLINICAL DATA:  Fall twisted ankle EXAM: RIGHT ANKLE - COMPLETE 3+ VIEW COMPARISON:  None. FINDINGS: There is no evidence of fracture, dislocation, or joint effusion. There is no evidence of arthropathy or other focal bone abnormality. Soft tissues are unremarkable. Calcaneal spurring plantar surface IMPRESSION: Negative. Electronically Signed   By: Franchot Gallo M.D.   On: 10/22/2017 14:59   Ct Head Wo Contrast  Result Date: 10/22/2017 CLINICAL DATA:  Frequent falls over the last 2 weeks. History of lung carcinoma. EXAM: CT HEAD WITHOUT CONTRAST TECHNIQUE: Contiguous axial images were obtained from the base of the skull through the vertex without intravenous contrast. COMPARISON:  None. FINDINGS: Brain: No evidence of acute infarction, hemorrhage, hydrocephalus, extra-axial collection or mass lesion/mass effect. There is ventricular and sulcal enlargement reflecting mild generalized atrophy. Vascular: No hyperdense vessel or unexpected calcification. Skull: Normal. Negative for fracture or focal lesion. Sinuses/Orbits:  Globes and orbits are unremarkable. There is mild ethmoid and minor maxillary sinus mucosal thickening. Remaining sinuses and mastoid air cells are clear. Other: None. IMPRESSION: 1. No acute intracranial abnormalities. 2. Mild generalized atrophy. Electronically Signed   By: Lajean Manes M.D.   On: 10/22/2017 17:28   Mr Brain Wo Contrast  Result Date: 10/23/2017 CLINICAL DATA:  Initial evaluation for new onset right foot drop. EXAM: MRI HEAD WITHOUT CONTRAST TECHNIQUE: Multiplanar, multiecho pulse sequences of the brain and surrounding structures were obtained without intravenous contrast. COMPARISON:  [facility] CT from earlier the same day. FINDINGS: Brain: Diffuse prominence of the CSF containing spaces compatible with generalized age related cerebral atrophy. Mild patchy and confluent T2/FLAIR hyperintensity within the periventricular and deep white matter both cerebral hemispheres, most consistent with chronic small vessel ischemic disease, mild for age. Remote lacunar infarcts present within the right basal ganglia, left thalamus, left paramedian pons, and right cerebellum. Chronic microhemorrhage associated with the chronic right cerebellar infarct noted. No abnormal foci of restricted diffusion to suggest acute or subacute ischemia. Faint punctate diffusion abnormality within the cortical gray matter of the right occipital lobe on axial DWI sequence not seen on corresponding sequences, and favored to be artifactual. Gray-white matter differentiation well maintained. No other areas of chronic infarction. No other evidence for chronic infarction. No mass lesion, midline shift or mass effect. No hydrocephalus. No extra-axial fluid collection. Major dural sinuses are grossly patent. No made of a partially empty sella. Midline structures intact and normal. Vascular: Major intracranial flow voids are maintained. Skull and upper cervical spine: Degenerative changes about the C1-2 articulation with thickening of  the tectorial membrane, resulting in mild spinal stenosis at the craniocervical junction. Gender spondylolysis at C3-4 with resultant mild to moderate spinal stenosis. Bone marrow signal intensity within normal limits. No scalp soft tissue abnormality. Sinuses/Orbits: Globes and orbital soft tissues within normal limits. Patient status post lens extraction  on the right. Scattered mucosal thickening throughout the paranasal sinuses. No air-fluid level to suggest acute sinusitis. Mastoid air cells are clear. Inner ear structures normal. Other: None. IMPRESSION: 1. No acute intracranial abnormality identified. 2. Remote lacunar infarcts involving the right basal ganglia, left thalamus, left paramedian pons, and right cerebellum. 3. Mild for age chronic small vessel ischemic disease. 4. Degenerate spondylolysis within the upper cervical spine with mild-to-moderate stenosis at C3-4. Follow-up examination with dedicated MRI of the cervical spine could be performed for further evaluation as clinically warranted. Electronically Signed   By: Jeannine Boga M.D.   On: 10/23/2017 00:05   Dg Foot Complete Right  Result Date: 10/22/2017 CLINICAL DATA:  Fall.  Twisted ankle EXAM: RIGHT FOOT COMPLETE - 3+ VIEW COMPARISON:  None. FINDINGS: There is no evidence of fracture or dislocation. There is no evidence of arthropathy or other focal bone abnormality. Soft tissues are unremarkable. IMPRESSION: Negative. Electronically Signed   By: Franchot Gallo M.D.   On: 10/22/2017 15:00    Review of Systems  Constitutional: Positive for malaise/fatigue. Negative for chills and fever.  Eyes: Negative for blurred vision.  Respiratory: Positive for cough and shortness of breath.   Cardiovascular: Positive for leg swelling. Negative for chest pain.  Gastrointestinal: Negative for nausea and vomiting.  Genitourinary: Negative for dysuria.  Neurological: Positive for weakness. Negative for dizziness.   Blood pressure (!)  140/57, pulse 83, temperature 98.2 F (36.8 C), resp. rate (!) 22, height '5\' 1"'  (1.549 m), weight 61.2 kg (135 lb), SpO2 99 %. Physical Exam  Constitutional: She is oriented to person, place, and time.  HENT:  Head: Normocephalic and atraumatic.  Eyes: Left eye exhibits no discharge. No scleral icterus.  Neck: Neck supple. No JVD present. No tracheal deviation present. No thyromegaly present.  Cardiovascular: Normal rate and regular rhythm.  Murmur (Soft systolic murmur and S3 gallop noted) heard. Respiratory:  Decrease pressor sounds bilaterally with rhonchi and faint rales noted  GI: Soft. Bowel sounds are normal. She exhibits no distension. There is no tenderness. There is no rebound.  Musculoskeletal:  No clubbing cyanosis 1+ edema noted  Neurological: She is alert and oriented to person, place, and time.    Assessment/Plan: Probable small non-Q-wave myocardial infarction with atypical presentation Mild acute congestive heart failure Possible bilateral radiation-induced pneumonitis Marked leukocytosis questionable etiology Acute on chronic anemia questionable etiology Hypertension Diabetes mellitus Hyperlipidemia History of lacunar infarcts in the past History of adenocarcinoma of right lung status post radiation COPD History of bronchial asthma Plan Check serial enzymes and EKG Check 2-D echo check LV systolic/diastolic function/wall motion abnormalities Change IV metoprolol to by mouth as per orders Start Nitropaste and statins Agree with heparin and aspirin for now Consider anemia workup, will consider cardiac catheterization once pulmonary function and leukocytosis improves and issues regarding anemia , are  resolved. Charolette Forward 10/23/2017, 12:43 AM

## 2017-10-24 ENCOUNTER — Inpatient Hospital Stay (HOSPITAL_COMMUNITY): Payer: Medicare Other

## 2017-10-24 DIAGNOSIS — M21371 Foot drop, right foot: Secondary | ICD-10-CM

## 2017-10-24 DIAGNOSIS — C3491 Malignant neoplasm of unspecified part of right bronchus or lung: Secondary | ICD-10-CM

## 2017-10-24 DIAGNOSIS — E119 Type 2 diabetes mellitus without complications: Secondary | ICD-10-CM

## 2017-10-24 DIAGNOSIS — I340 Nonrheumatic mitral (valve) insufficiency: Secondary | ICD-10-CM

## 2017-10-24 DIAGNOSIS — D509 Iron deficiency anemia, unspecified: Secondary | ICD-10-CM

## 2017-10-24 DIAGNOSIS — I509 Heart failure, unspecified: Secondary | ICD-10-CM

## 2017-10-24 LAB — CBC
HEMATOCRIT: 26.2 % — AB (ref 36.0–46.0)
Hemoglobin: 8.1 g/dL — ABNORMAL LOW (ref 12.0–15.0)
MCH: 24 pg — ABNORMAL LOW (ref 26.0–34.0)
MCHC: 30.9 g/dL (ref 30.0–36.0)
MCV: 77.7 fL — AB (ref 78.0–100.0)
PLATELETS: 394 10*3/uL (ref 150–400)
RBC: 3.37 MIL/uL — ABNORMAL LOW (ref 3.87–5.11)
RDW: 21.8 % — ABNORMAL HIGH (ref 11.5–15.5)
WBC: 19.9 10*3/uL — AB (ref 4.0–10.5)

## 2017-10-24 LAB — ECHOCARDIOGRAM COMPLETE
HEIGHTINCHES: 62 in
Weight: 2217.6 oz

## 2017-10-24 LAB — BASIC METABOLIC PANEL
Anion gap: 9 (ref 5–15)
BUN: 9 mg/dL (ref 6–20)
CO2: 25 mmol/L (ref 22–32)
CREATININE: 0.93 mg/dL (ref 0.44–1.00)
Calcium: 8.3 mg/dL — ABNORMAL LOW (ref 8.9–10.3)
Chloride: 100 mmol/L — ABNORMAL LOW (ref 101–111)
GFR calc Af Amer: >60 mL/min (ref >60)
GFR, EST NON AFRICAN AMERICAN: 54 mL/min — AB (ref >60)
GLUCOSE: 100 mg/dL — AB (ref 65–99)
POTASSIUM: 3.3 mmol/L — AB (ref 3.5–5.1)
Sodium: 134 mmol/L — ABNORMAL LOW (ref 135–145)

## 2017-10-24 LAB — SAVE SMEAR

## 2017-10-24 LAB — GLUCOSE, CAPILLARY
GLUCOSE-CAPILLARY: 122 mg/dL — AB (ref 65–99)
GLUCOSE-CAPILLARY: 169 mg/dL — AB (ref 65–99)
Glucose-Capillary: 90 mg/dL (ref 65–99)
Glucose-Capillary: 185 mg/dL — ABNORMAL HIGH (ref 65–99)

## 2017-10-24 LAB — IRON AND TIBC
IRON: 21 ug/dL — AB (ref 28–170)
Saturation Ratios: 9 % — ABNORMAL LOW (ref 10.4–31.8)
TIBC: 224 ug/dL — AB (ref 250–450)
UIBC: 203 ug/dL

## 2017-10-24 LAB — TROPONIN I: TROPONIN I: 2 ng/mL — AB (ref <0.03)

## 2017-10-24 LAB — OCCULT BLOOD X 1 CARD TO LAB, STOOL: Fecal Occult Bld: NEGATIVE

## 2017-10-24 LAB — FERRITIN: FERRITIN: 181 ng/mL (ref 11–307)

## 2017-10-24 MED ORDER — POTASSIUM CHLORIDE CRYS ER 20 MEQ PO TBCR
40.0000 meq | EXTENDED_RELEASE_TABLET | Freq: Two times a day (BID) | ORAL | Status: DC
  Administered 2017-10-24 – 2017-10-25 (×3): 40 meq via ORAL
  Filled 2017-10-24 (×3): qty 2

## 2017-10-24 MED ORDER — FUROSEMIDE 10 MG/ML IJ SOLN
40.0000 mg | Freq: Two times a day (BID) | INTRAMUSCULAR | Status: DC
  Administered 2017-10-25: 40 mg via INTRAVENOUS
  Filled 2017-10-24 (×2): qty 4

## 2017-10-24 MED ORDER — ASPIRIN EC 325 MG PO TBEC
325.0000 mg | DELAYED_RELEASE_TABLET | Freq: Every day | ORAL | Status: DC
  Administered 2017-10-25: 325 mg via ORAL
  Filled 2017-10-24: qty 1

## 2017-10-24 NOTE — Progress Notes (Signed)
Ortho paged. 

## 2017-10-24 NOTE — Care Management Note (Signed)
Case Management Note  Patient Details  Name: Erin Carr MRN: [id number] Date of Birth: 1930/02/11  Subjective/Objective:    Acute CHF               Action/Plan: Patient lives with her sister; PCP is Dr Vincente Liberty; has private insurance with Medicare ; patient could benefit from a Disease Management Program for CHF; [place] choice offered, patient chose [facility]; [facility] with [facility] called for arrangements. DME- walker and cane at home.  Expected Discharge Date:    10/24/2017             Expected Discharge Plan:  [place]  In-House Referral:   [facility]  Discharge planning Services  CM Consult Choice offered to:  Patient  HH Arranged:  RN, Nurse's Aide, Disease Management, PT [place] Agency:  [facility] ([facility])  Status of Service:  In process, will continue to follow  Sherrilyn Rist [phone] 10/24/2017, 3:52 PM

## 2017-10-24 NOTE — Progress Notes (Signed)
Orthopedic Tech Progress Note Patient Details:  Erin Carr 1930/12/04 [id number]  Ortho Devices Type of Ortho Device: Postop shoe/boot Ortho Device/Splint Location: RLE prafo boot Ortho Device/Splint Interventions: Ordered, Application   Braulio Bosch 10/24/2017, 4:34 PM

## 2017-10-24 NOTE — Clinical Social Work Note (Signed)
PT recommending HHPT. CSW signing off. Consult again if any other social work needs arise.  Dayton Scrape, [place]

## 2017-10-24 NOTE — Progress Notes (Signed)
PROGRESS NOTE    Erin Carr  YTK:[id number] DOB: 07-Jan-1930 DOA: 10/22/2017 PCP: Vincente Liberty, MD      Brief Narrative:  83 yo F with HTN, DM, COPD, anemia, lung CA with radiation pneumonitis who presents with new CHF, NSTEMI and facial droop.  Diuresed with IV lasix, net negative 2L.  Troponin flat, Cardiology recommending medical mgmt, no Cath.  MRI brain showed no infarct.     Assessment & Plan:  Principal Problem:   Acute CHF (congestive heart failure) (HCC) Active Problems:   Adenocarcinoma of right lung, stage 1 (HCC)   Microcytic anemia   Type 2 diabetes mellitus without complication (HCC)   Foot drop   Facial droop   NSTEMI (non-ST elevated myocardial infarction) ([place])   Fall   Leukocytosis   Radiation pneumonitis ([place])   1. Acute CHF EF unknown.  No chronic CHF.   Net negative 2L. -Furosemide 40 mg IV twice a day  -K supplement -Strict I/Os, daily weights, telemetry  -Daily monitoring renal function -F/u echocardiogram   2. NSTEMI No previous CAD. Treated with heparin. -Cardiology following, no plans for invasive w/u -Continue aspirin, statin, BB  3. Left facial droop, right foot drop: Nonphysiologic weakness.  MRI shows no acute infarct. -Outpatient EMG/NCS with neurology -AFO brace  4. Chronic iron deficiency anemia Chronic, no change, at baseline.  Microcytic, followed by Heme/Onc.   -Continue iron  5. Diabetes -SSI  6. Leukocytosis Unclear etiology, no signs of infection.  Maintain off antibiotics          DVT prophylaxis: Lovenox Code Status: FULL Family Communication: None presnet Disposition Plan: Follow up echocardiogram, still with some need for diuresis.  Likely home in next 1-2 days with home health PT and Cardiology f/u.   Consultants:   Cardiology  Neurology  Procedures:   Echocardiogram  Antimicrobials:   None    Subjective: Feeling tired, but improving.  Dyspnea improved.  Able to walk with PT.   No nwe chest pain, fever, sputum production, cough.  Leg swelling better.    Objective: Vitals:   10/23/17 2241 10/24/17 0051 10/24/17 0516 10/24/17 1208  BP:  (!) 141/54 (!) 156/64 (!) 146/61  Pulse: 94 89 90 86  Resp:  18 18   Temp:  98.8 F (37.1 C) 98.9 F (37.2 C) 98.6 F (37 C)  TempSrc:  Oral Oral Oral  SpO2:  99% 96% 100%  Weight:   62.9 kg (138 lb 9.6 oz)   Height:        Intake/Output Summary (Last 24 hours) at 10/24/2017 1548 Last data filed at 10/24/2017 1331 Gross per 24 hour  Intake 720 ml  Output 2650 ml  Net -1930 ml   Filed Weights   10/23/17 0015 10/23/17 1331 10/24/17 0516  Weight: 61.2 kg (135 lb) 62.8 kg (138 lb 7.2 oz) 62.9 kg (138 lb 9.6 oz)    Examination: General appearance: Elderly adult female, alert and in no acute distress.   Lying in bed, appears tired. HEENT: Anicteric, conjunctiva pink, lids and lashes normal. No nasal deformity, discharge, epistaxis.  Lips moist.   Skin: Warm and dry.  No suspicious rashes or lesions. Cardiac: RRR, nl S1-S2, no murmurs appreciated.  Capillary refill is brisk.  JVP not visible.  1+ LE edema.  Radial pulses 2+ and symmetric. Respiratory: Normal respiratory rate and rhythm.  CTAB without rales or wheezes. Abdomen: Abdomen soft.  No TTP. No ascites, distension, hepatosplenomegaly.   MSK: No deformities or effusions. Neuro:  Awake and alert.  EOMI, moves all extremities. Speech fluent.    Psych: Sensorium intact and responding to questions, attention normal. Affect normal.  Judgment and insight appear normal.    Data Reviewed: I have personally reviewed following labs and imaging studies:  CBC: Recent Labs  Lab 10/22/17 1424 10/23/17 0333 10/24/17 0520  WBC 23.8* 21.3* 19.9*  NEUTROABS 8.6*  --   --   HGB 7.7* 7.7* 8.1*  HCT 25.9* 25.7* 26.2*  MCV 76.9* 76.7* 77.7*  PLT 345 367 540   Basic Metabolic Panel: Recent Labs  Lab 10/22/17 1424 10/23/17 0333 10/24/17 0520  NA 132* 134* 134*  K 3.7  3.3* 3.3*  CL 100* 101 100*  CO2 23 25 25   GLUCOSE 88 77 100*  BUN 13 10 9   CREATININE 1.04* 0.87 0.93  CALCIUM 8.5* 8.3* 8.3*   GFR: Estimated Creatinine Clearance: 38.5 mL/min (by C-G formula based on SCr of 0.93 mg/dL). Liver Function Tests: Recent Labs  Lab 10/22/17 1424  AST 33  ALT 22  ALKPHOS 165*  BILITOT 0.6  PROT 6.0*  ALBUMIN 2.4*   No results for input(s): LIPASE, AMYLASE in the last 168 hours. No results for input(s): AMMONIA in the last 168 hours. Coagulation Profile: No results for input(s): INR, PROTIME in the last 168 hours. Cardiac Enzymes: Recent Labs  Lab 10/22/17 1424 10/22/17 2134 10/23/17 0333 10/23/17 0930 10/24/17 0520  TROPONINI 1.77* 1.90* 2.08* 2.01* 2.00*   BNP (last 3 results) No results for input(s): PROBNP in the last 8760 hours. HbA1C: Recent Labs    10/23/17 0333  HGBA1C 4.8   CBG: Recent Labs  Lab 10/23/17 1245 10/23/17 1635 10/23/17 2115 10/24/17 0749 10/24/17 1129  GLUCAP 74 132* 135* 90 122*   Lipid Profile: Recent Labs    10/23/17 0333  CHOL 106  HDL 43  LDLCALC 49  TRIG 68  CHOLHDL 2.5   Thyroid Function Tests: No results for input(s): TSH, T4TOTAL, FREET4, T3FREE, THYROIDAB in the last 72 hours. Anemia Panel: Recent Labs    10/24/17 0520  FERRITIN 181  TIBC 224*  IRON 21*   Urine analysis:    Component Value Date/Time   COLORURINE YELLOW 10/22/2017 1745   APPEARANCEUR CLEAR 10/22/2017 1745   LABSPEC 1.008 10/22/2017 1745   PHURINE 7.0 10/22/2017 1745   GLUCOSEU NEGATIVE 10/22/2017 1745   HGBUR NEGATIVE 10/22/2017 1745   BILIRUBINUR NEGATIVE 10/22/2017 1745   KETONESUR NEGATIVE 10/22/2017 1745   PROTEINUR NEGATIVE 10/22/2017 1745   NITRITE NEGATIVE 10/22/2017 1745   LEUKOCYTESUR NEGATIVE 10/22/2017 1745   Sepsis Labs: @LABRCNTIP (procalcitonin:4,lacticidven:4)  ) Recent Results (from the past 240 hour(s))  Culture, blood (Routine X 2) w Reflex to ID Panel     Status: None (Preliminary  result)   Collection Time: 10/22/17  9:42 PM  Result Value Ref Range Status   Specimen Description BLOOD RIGHT ANTECUBITAL  Final   Special Requests   Final    BOTTLES DRAWN AEROBIC AND ANAEROBIC Blood Culture adequate volume   Culture NO GROWTH 2 DAYS  Final   Report Status PENDING  Incomplete  Culture, blood (Routine X 2) w Reflex to ID Panel     Status: None (Preliminary result)   Collection Time: 10/22/17  9:50 PM  Result Value Ref Range Status   Specimen Description BLOOD LEFT ARM  Final   Special Requests   Final    BOTTLES DRAWN AEROBIC ONLY Blood Culture results may not be optimal due to an inadequate volume of  blood received in culture bottles   Culture NO GROWTH 2 DAYS  Final   Report Status PENDING  Incomplete         Radiology Studies: Dg Chest 2 View  Result Date: 10/22/2017 CLINICAL DATA:  Weakness.  Edema. EXAM: CHEST  2 VIEW COMPARISON:  10/09/2017 FINDINGS: The cardiac silhouette is normal in size. No mediastinal or hilar masses. Airspace opacity extends laterally from the right hilum into the right upper lobe. There is irregular interstitial thickening bilaterally. Patchy hazy airspace opacities in noted more diffusely primarily in both upper lobes. This appearance is similar to the most recent prior exam. No pleural effusion.  No pneumothorax. Skeletal structures are demineralized but grossly intact. IMPRESSION: 1. Bilateral airspace and interstitial opacities, most evident in the upper lobes, similar to the most recent prior exam. The findings have improved on the right when compared to a CT scan dated 08/07/2017. Residual lung opacities may be due to infection or inflammation. Edema is felt less likely particularly in the absence of pleural effusions. Electronically Signed   By: Lajean Manes M.D.   On: 10/22/2017 17:26   Ct Head Wo Contrast  Result Date: 10/22/2017 CLINICAL DATA:  Frequent falls over the last 2 weeks. History of lung carcinoma. EXAM: CT HEAD  WITHOUT CONTRAST TECHNIQUE: Contiguous axial images were obtained from the base of the skull through the vertex without intravenous contrast. COMPARISON:  None. FINDINGS: Brain: No evidence of acute infarction, hemorrhage, hydrocephalus, extra-axial collection or mass lesion/mass effect. There is ventricular and sulcal enlargement reflecting mild generalized atrophy. Vascular: No hyperdense vessel or unexpected calcification. Skull: Normal. Negative for fracture or focal lesion. Sinuses/Orbits: Globes and orbits are unremarkable. There is mild ethmoid and minor maxillary sinus mucosal thickening. Remaining sinuses and mastoid air cells are clear. Other: None. IMPRESSION: 1. No acute intracranial abnormalities. 2. Mild generalized atrophy. Electronically Signed   By: Lajean Manes M.D.   On: 10/22/2017 17:28   Mr Brain Wo Contrast  Result Date: 10/23/2017 CLINICAL DATA:  Initial evaluation for new onset right foot drop. EXAM: MRI HEAD WITHOUT CONTRAST TECHNIQUE: Multiplanar, multiecho pulse sequences of the brain and surrounding structures were obtained without intravenous contrast. COMPARISON:  [facility] CT from earlier the same day. FINDINGS: Brain: Diffuse prominence of the CSF containing spaces compatible with generalized age related cerebral atrophy. Mild patchy and confluent T2/FLAIR hyperintensity within the periventricular and deep white matter both cerebral hemispheres, most consistent with chronic small vessel ischemic disease, mild for age. Remote lacunar infarcts present within the right basal ganglia, left thalamus, left paramedian pons, and right cerebellum. Chronic microhemorrhage associated with the chronic right cerebellar infarct noted. No abnormal foci of restricted diffusion to suggest acute or subacute ischemia. Faint punctate diffusion abnormality within the cortical gray matter of the right occipital lobe on axial DWI sequence not seen on corresponding sequences, and favored to be  artifactual. Gray-white matter differentiation well maintained. No other areas of chronic infarction. No other evidence for chronic infarction. No mass lesion, midline shift or mass effect. No hydrocephalus. No extra-axial fluid collection. Major dural sinuses are grossly patent. No made of a partially empty sella. Midline structures intact and normal. Vascular: Major intracranial flow voids are maintained. Skull and upper cervical spine: Degenerative changes about the C1-2 articulation with thickening of the tectorial membrane, resulting in mild spinal stenosis at the craniocervical junction. Gender spondylolysis at C3-4 with resultant mild to moderate spinal stenosis. Bone marrow signal intensity within normal limits. No scalp soft tissue abnormality.  Sinuses/Orbits: Globes and orbital soft tissues within normal limits. Patient status post lens extraction on the right. Scattered mucosal thickening throughout the paranasal sinuses. No air-fluid level to suggest acute sinusitis. Mastoid air cells are clear. Inner ear structures normal. Other: None. IMPRESSION: 1. No acute intracranial abnormality identified. 2. Remote lacunar infarcts involving the right basal ganglia, left thalamus, left paramedian pons, and right cerebellum. 3. Mild for age chronic small vessel ischemic disease. 4. Degenerate spondylolysis within the upper cervical spine with mild-to-moderate stenosis at C3-4. Follow-up examination with dedicated MRI of the cervical spine could be performed for further evaluation as clinically warranted. Electronically Signed   By: Jeannine Boga M.D.   On: 10/23/2017 00:05   Mr Lumbar Spine Wo Contrast  Result Date: 10/23/2017 CLINICAL DATA:  83 year old female status post fall several weeks ago. Back pain. Right foot drop. Pain in the right foot an ankle. Bilateral lower extremity swelling. Right lung cancer. EXAM: MRI LUMBAR SPINE WITHOUT CONTRAST TECHNIQUE: Multiplanar, multisequence MR imaging of  the lumbar spine was performed. No intravenous contrast was administered. COMPARISON:  PET-CT 06/26/2017, and earlier. Lumbar radiographs 06/21/2015. FINDINGS: Segmentation: Normal lumbar segmentation demonstrated on the comparison radiographs. Alignment:  Chronic straightening of lumbar lordosis. Vertebrae: Chronic degenerative endplate marrow signal changes throughout the lumbar spine, sparing L1-L2, with patchy associated marrow edema at L3-L4, L4-L5, and L5-S1. There is chronic complete loss of the disc spaces at these levels. On a 2011 CT Abdomen and Pelvis there was vacuum disc at each level. There is now trace increased T2 and STIR signal at each of these levels (series 4, image 6). There does appear to be mild medial psoas muscle inflammation bilaterally (e.g. On the right series 4, image 1 at the L4-L5 level. But there is no prevertebral or epidural space inflammation or fluid identified. Background bone marrow signal elsewhere is within normal limits. Intact visible sacrum and SI joints. Chronic inferior endplate Schmorl nodes at T11 and L1. Conus medullaris and cauda equina: Conus extends to the L1-L2 level. Conus and cauda equina appear normal. Paraspinal and other soft tissues: Benign right renal cyst again noted. Negative visualized posterior paraspinal soft tissues. Disc levels: Mild for age spinal degeneration from T10-T11 to L1-L2. L2-L3: Severe disc space loss with left eccentric circumferential disc osteophyte complex. Broad-based posterior component with mild facet and ligament flavum hypertrophy. Borderline to mild spinal stenosis. L3-L4: Severe disc space loss with right eccentric circumferential disc osteophyte complex and broad-based posterior component. Mild to moderate facet hypertrophy. Mild spinal stenosis. Mild to moderate right L3 neural foraminal stenosis. L4-L5: Severe disc space loss with bulky circumferential disc osteophyte complex. Broad-based posterior component. Mild to moderate  facet and ligament flavum hypertrophy. Mild spinal stenosis. Mild left and mild to moderate right L4 foraminal stenosis. L5-S1: Severe disc space loss with circumferential disc osteophyte complex. Far lateral involvement primarily. Moderate facet and ligament flavum hypertrophy greater on the right. No spinal stenosis. Mild to moderate left and moderate right L5 foraminal stenosis. IMPRESSION: 1. Patchy endplate marrow edema throughout the lower lumbar spine L3-L4 through L5-S1 is associated with chronic complete disc space loss, trace fluid signal in the disc spaces, and mild psoas muscle inflammation. However, this constellation more resembles severe degenerative changes than multilevel infectious discitis-osteomyelitis, or lumbar metastatic disease. 2. Subsequent multilevel mild lumbar spinal stenosis and up to moderate L3 through L5 neural foraminal stenosis. Electronically Signed   By: Genevie Ann M.D.   On: 10/23/2017 12:42  Scheduled Meds: . [START ON 10/25/2017] aspirin EC  325 mg Oral Daily  . atorvastatin  40 mg Oral q1800  . enoxaparin (LOVENOX) injection  30 mg Subcutaneous Q24H  . ferrous sulfate  325 mg Oral Daily  . furosemide  40 mg Intravenous BID  . insulin aspart  0-5 Units Subcutaneous QHS  . insulin aspart  0-9 Units Subcutaneous TID WC  . latanoprost  1 drop Both Eyes QHS  . metoprolol tartrate  12.5 mg Oral BID  . nitroGLYCERIN  0.5 inch Topical Q6H  . potassium chloride  40 mEq Oral Once  . potassium chloride  40 mEq Oral BID  . sodium chloride flush  3 mL Intravenous Q12H   Continuous Infusions: . sodium chloride       LOS: 2 days    Time spent: 45 minutes    Edwin Dada, MD [facility] Pager [phone]  If 7PM-7AM, please contact night-coverage [email or web address] Password [facility] 10/24/2017, 3:48 PM

## 2017-10-24 NOTE — Progress Notes (Signed)
Pt states MD told her she was being discharged today.  No orders for discharge, informed pt that note says dc home in 1-2 days.  MD paged to clarify, pt is not being discharged today.

## 2017-10-24 NOTE — Evaluation (Signed)
Physical Therapy Evaluation Patient Details Name: Erin Carr MRN: [id number] DOB: 06-13-1930 Today's Date: 10/24/2017   History of Present Illness  Pt adm with acute chf and small non Q wave MI. Pt also with lt facial droop. Head MRI without acute changes. Pt also with rt foot drop since fall 3 wks ago. PMH - lung CA, DM, HTN, COPD, arthritis  Clinical Impression  Pt presents to PT with rt foot drop. Pt mobilizing adequately to return home. Recommend HHPT and recommend Orthotist consult for rt AFO (believe this has to go through ortho techs). MD please order if you agree. HHPT can follow at home for use of AFO.     Follow Up Recommendations Home health PT;Supervision - Intermittent    Equipment Recommendations  Other (comment)(AFO for rt)    Recommendations for Other Services Other (comment)(orthotist consult for rt AFO)     Precautions / Restrictions Precautions Precautions: Fall Restrictions Weight Bearing Restrictions: No      Mobility  Bed Mobility Overal bed mobility: Modified Independent                Transfers Overall transfer level: Modified independent Equipment used: None;4-wheeled walker                Ambulation/Gait Ambulation/Gait assistance: Modified independent (Device/Increase time) Ambulation Distance (Feet): 150 Feet Assistive device: Rolling walker (2 wheeled) Gait Pattern/deviations: Step-through pattern;Decreased stride length;Steppage;Decreased dorsiflexion - right Gait velocity: decr Gait velocity interpretation: Below normal speed for age/gender General Gait Details: Steady gait with walker. Pt with good awareness of rt foot drop and compensates with steppage gait.  Stairs            Wheelchair Mobility    Modified Rankin (Stroke Patients Only)       Balance Overall balance assessment: Needs assistance Sitting-balance support: No upper extremity supported;Feet supported Sitting balance-Leahy Scale: Good      Standing balance support: No upper extremity supported Standing balance-Leahy Scale: Fair                               Pertinent Vitals/Pain Pain Assessment: No/denies pain    Home Living Family/patient expects to be discharged to:: Private residence Living Arrangements: Alone;Other (Comment)(twin sister has been staying with her) Available Help at Discharge: Family;Available 24 hours/day Type of Home: House Home Access: Level entry     Home Layout: One level Home Equipment: Walker - standard;Cane - single point      Prior Function Level of Independence: Independent with assistive device(s)         Comments: amb with cane     Hand Dominance        Extremity/Trunk Assessment   Upper Extremity Assessment Upper Extremity Assessment: Defer to OT evaluation    Lower Extremity Assessment Lower Extremity Assessment: RLE deficits/detail RLE Deficits / Details: Hip and knee WFL. Dorsiflexion 0/5, plantarflex 2/5    Cervical / Trunk Assessment Cervical / Trunk Assessment: Kyphotic  Communication   Communication: No difficulties  Cognition Arousal/Alertness: Awake/alert Behavior During Therapy: WFL for tasks assessed/performed Overall Cognitive Status: Within Functional Limits for tasks assessed                                        General Comments      Exercises     Assessment/Plan    PT Assessment  All further PT needs can be met in the next venue of care  PT Problem List Decreased mobility;Decreased strength       PT Treatment Interventions      PT Goals (Current goals can be found in the Care Plan section)  Acute Rehab PT Goals PT Goal Formulation: All assessment and education complete, DC therapy    Frequency     Barriers to discharge        Co-evaluation               AM-PAC PT "6 Clicks" Daily Activity  Outcome Measure Difficulty turning over in bed (including adjusting bedclothes, sheets and  blankets)?: None Difficulty moving from lying on back to sitting on the side of the bed? : None Difficulty sitting down on and standing up from a chair with arms (e.g., wheelchair, bedside commode, etc,.)?: None Help needed moving to and from a bed to chair (including a wheelchair)?: None Help needed walking in hospital room?: None Help needed climbing 3-5 steps with a railing? : A Little 6 Click Score: 23    End of Session Equipment Utilized During Treatment: Gait belt Activity Tolerance: Patient tolerated treatment well Patient left: in bed;with call bell/phone within reach;with bed alarm set Nurse Communication: Mobility status PT Visit Diagnosis: Other abnormalities of gait and mobility (R26.89)    Time: 0100-7121 PT Time Calculation (min) (ACUTE ONLY): 12 min   Charges:   PT Evaluation $PT Eval Low Complexity: 1 Low     PT G CodesMarland Kitchen        [facility] [place] 10/24/2017, 3:01 PM

## 2017-10-24 NOTE — Progress Notes (Signed)
OT Cancellation Note  Patient Details Name: Erin Carr MRN: [id number] DOB: 02-12-1930   Cancelled Treatment:    Reason Eval/Treat Not Completed: Patient at procedure or test/ unavailable. Pt preparing for echo on my arrival. Will check back as able.   Norman Herrlich, MS OTR/L  Pager: [name] 10/24/2017, 3:01 PM

## 2017-10-24 NOTE — Progress Notes (Signed)
Subjective:  Patient denies any chest pain or shortness of breath. States overall feels much better now. Leg swelling resolved.  Objective:  Vital Signs in the last 24 hours: Temp:  [98.1 F (36.7 C)-98.9 F (37.2 C)] 98.6 F (37 C) (11/21 1208) Pulse Rate:  [85-94] 86 (11/21 1208) Resp:  [18] 18 (11/21 0516) BP: (85-156)/(52-72) 146/61 (11/21 1208) SpO2:  [96 %-100 %] 100 % (11/21 1208) Weight:  [62.8 kg (138 lb 7.2 oz)-62.9 kg (138 lb 9.6 oz)] 62.9 kg (138 lb 9.6 oz) (11/21 0516)  Intake/Output from previous day: 11/20 0701 - 11/21 0700 In: 360 [P.O.:360] Out: 1550 [Urine:1550] Intake/Output from this shift: Total I/O In: 120 [P.O.:120] Out: 1300 [Urine:1300]  Physical Exam: Neck: no adenopathy, no carotid bruit, no JVD and supple, symmetrical, trachea midline Lungs: decreased breath sound at bases Heart: regular rate and rhythm, S1, S2 normal and soft systolic murmur noted no S3 gallop Abdomen: soft, non-tender; bowel sounds normal; no masses,  no organomegaly Extremities: extremities normal, atraumatic, no cyanosis or edema  Lab Results: Recent Labs    10/23/17 0333 10/24/17 0520  WBC 21.3* 19.9*  HGB 7.7* 8.1*  PLT 367 394   Recent Labs    10/23/17 0333 10/24/17 0520  NA 134* 134*  K 3.3* 3.3*  CL 101 100*  CO2 25 25  GLUCOSE 77 100*  BUN 10 9  CREATININE 0.87 0.93   Recent Labs    10/23/17 0930 10/24/17 0520  TROPONINI 2.01* 2.00*   Hepatic Function Panel Recent Labs    10/22/17 1424  PROT 6.0*  ALBUMIN 2.4*  AST 33  ALT 22  ALKPHOS 165*  BILITOT 0.6   Recent Labs    10/23/17 0333  CHOL 106   No results for input(s): PROTIME in the last 72 hours.  Imaging: Imaging results have been reviewed and Dg Chest 2 View  Result Date: 10/22/2017 CLINICAL DATA:  Weakness.  Edema. EXAM: CHEST  2 VIEW COMPARISON:  10/09/2017 FINDINGS: The cardiac silhouette is normal in size. No mediastinal or hilar masses. Airspace opacity extends laterally  from the right hilum into the right upper lobe. There is irregular interstitial thickening bilaterally. Patchy hazy airspace opacities in noted more diffusely primarily in both upper lobes. This appearance is similar to the most recent prior exam. No pleural effusion.  No pneumothorax. Skeletal structures are demineralized but grossly intact. IMPRESSION: 1. Bilateral airspace and interstitial opacities, most evident in the upper lobes, similar to the most recent prior exam. The findings have improved on the right when compared to a CT scan dated 08/07/2017. Residual lung opacities may be due to infection or inflammation. Edema is felt less likely particularly in the absence of pleural effusions. Electronically Signed   By: Lajean Manes M.D.   On: 10/22/2017 17:26   Dg Ankle Complete Right  Result Date: 10/22/2017 CLINICAL DATA:  Fall twisted ankle EXAM: RIGHT ANKLE - COMPLETE 3+ VIEW COMPARISON:  None. FINDINGS: There is no evidence of fracture, dislocation, or joint effusion. There is no evidence of arthropathy or other focal bone abnormality. Soft tissues are unremarkable. Calcaneal spurring plantar surface IMPRESSION: Negative. Electronically Signed   By: Franchot Gallo M.D.   On: 10/22/2017 14:59   Ct Head Wo Contrast  Result Date: 10/22/2017 CLINICAL DATA:  Frequent falls over the last 2 weeks. History of lung carcinoma. EXAM: CT HEAD WITHOUT CONTRAST TECHNIQUE: Contiguous axial images were obtained from the base of the skull through the vertex without intravenous contrast. COMPARISON:  None. FINDINGS: Brain: No evidence of acute infarction, hemorrhage, hydrocephalus, extra-axial collection or mass lesion/mass effect. There is ventricular and sulcal enlargement reflecting mild generalized atrophy. Vascular: No hyperdense vessel or unexpected calcification. Skull: Normal. Negative for fracture or focal lesion. Sinuses/Orbits: Globes and orbits are unremarkable. There is mild ethmoid and minor  maxillary sinus mucosal thickening. Remaining sinuses and mastoid air cells are clear. Other: None. IMPRESSION: 1. No acute intracranial abnormalities. 2. Mild generalized atrophy. Electronically Signed   By: Lajean Manes M.D.   On: 10/22/2017 17:28   Mr Brain Wo Contrast  Result Date: 10/23/2017 CLINICAL DATA:  Initial evaluation for new onset right foot drop. EXAM: MRI HEAD WITHOUT CONTRAST TECHNIQUE: Multiplanar, multiecho pulse sequences of the brain and surrounding structures were obtained without intravenous contrast. COMPARISON:  [facility] CT from earlier the same day. FINDINGS: Brain: Diffuse prominence of the CSF containing spaces compatible with generalized age related cerebral atrophy. Mild patchy and confluent T2/FLAIR hyperintensity within the periventricular and deep white matter both cerebral hemispheres, most consistent with chronic small vessel ischemic disease, mild for age. Remote lacunar infarcts present within the right basal ganglia, left thalamus, left paramedian pons, and right cerebellum. Chronic microhemorrhage associated with the chronic right cerebellar infarct noted. No abnormal foci of restricted diffusion to suggest acute or subacute ischemia. Faint punctate diffusion abnormality within the cortical gray matter of the right occipital lobe on axial DWI sequence not seen on corresponding sequences, and favored to be artifactual. Gray-white matter differentiation well maintained. No other areas of chronic infarction. No other evidence for chronic infarction. No mass lesion, midline shift or mass effect. No hydrocephalus. No extra-axial fluid collection. Major dural sinuses are grossly patent. No made of a partially empty sella. Midline structures intact and normal. Vascular: Major intracranial flow voids are maintained. Skull and upper cervical spine: Degenerative changes about the C1-2 articulation with thickening of the tectorial membrane, resulting in mild spinal stenosis at the  craniocervical junction. Gender spondylolysis at C3-4 with resultant mild to moderate spinal stenosis. Bone marrow signal intensity within normal limits. No scalp soft tissue abnormality. Sinuses/Orbits: Globes and orbital soft tissues within normal limits. Patient status post lens extraction on the right. Scattered mucosal thickening throughout the paranasal sinuses. No air-fluid level to suggest acute sinusitis. Mastoid air cells are clear. Inner ear structures normal. Other: None. IMPRESSION: 1. No acute intracranial abnormality identified. 2. Remote lacunar infarcts involving the right basal ganglia, left thalamus, left paramedian pons, and right cerebellum. 3. Mild for age chronic small vessel ischemic disease. 4. Degenerate spondylolysis within the upper cervical spine with mild-to-moderate stenosis at C3-4. Follow-up examination with dedicated MRI of the cervical spine could be performed for further evaluation as clinically warranted. Electronically Signed   By: Jeannine Boga M.D.   On: 10/23/2017 00:05   Mr Lumbar Spine Wo Contrast  Result Date: 10/23/2017 CLINICAL DATA:  83 year old female status post fall several weeks ago. Back pain. Right foot drop. Pain in the right foot an ankle. Bilateral lower extremity swelling. Right lung cancer. EXAM: MRI LUMBAR SPINE WITHOUT CONTRAST TECHNIQUE: Multiplanar, multisequence MR imaging of the lumbar spine was performed. No intravenous contrast was administered. COMPARISON:  PET-CT 06/26/2017, and earlier. Lumbar radiographs 06/21/2015. FINDINGS: Segmentation: Normal lumbar segmentation demonstrated on the comparison radiographs. Alignment:  Chronic straightening of lumbar lordosis. Vertebrae: Chronic degenerative endplate marrow signal changes throughout the lumbar spine, sparing L1-L2, with patchy associated marrow edema at L3-L4, L4-L5, and L5-S1. There is chronic complete loss of the disc spaces  at these levels. On a 2011 CT Abdomen and Pelvis there  was vacuum disc at each level. There is now trace increased T2 and STIR signal at each of these levels (series 4, image 6). There does appear to be mild medial psoas muscle inflammation bilaterally (e.g. On the right series 4, image 1 at the L4-L5 level. But there is no prevertebral or epidural space inflammation or fluid identified. Background bone marrow signal elsewhere is within normal limits. Intact visible sacrum and SI joints. Chronic inferior endplate Schmorl nodes at T11 and L1. Conus medullaris and cauda equina: Conus extends to the L1-L2 level. Conus and cauda equina appear normal. Paraspinal and other soft tissues: Benign right renal cyst again noted. Negative visualized posterior paraspinal soft tissues. Disc levels: Mild for age spinal degeneration from T10-T11 to L1-L2. L2-L3: Severe disc space loss with left eccentric circumferential disc osteophyte complex. Broad-based posterior component with mild facet and ligament flavum hypertrophy. Borderline to mild spinal stenosis. L3-L4: Severe disc space loss with right eccentric circumferential disc osteophyte complex and broad-based posterior component. Mild to moderate facet hypertrophy. Mild spinal stenosis. Mild to moderate right L3 neural foraminal stenosis. L4-L5: Severe disc space loss with bulky circumferential disc osteophyte complex. Broad-based posterior component. Mild to moderate facet and ligament flavum hypertrophy. Mild spinal stenosis. Mild left and mild to moderate right L4 foraminal stenosis. L5-S1: Severe disc space loss with circumferential disc osteophyte complex. Far lateral involvement primarily. Moderate facet and ligament flavum hypertrophy greater on the right. No spinal stenosis. Mild to moderate left and moderate right L5 foraminal stenosis. IMPRESSION: 1. Patchy endplate marrow edema throughout the lower lumbar spine L3-L4 through L5-S1 is associated with chronic complete disc space loss, trace fluid signal in the disc spaces,  and mild psoas muscle inflammation. However, this constellation more resembles severe degenerative changes than multilevel infectious discitis-osteomyelitis, or lumbar metastatic disease. 2. Subsequent multilevel mild lumbar spinal stenosis and up to moderate L3 through L5 neural foraminal stenosis. Electronically Signed   By: Genevie Ann M.D.   On: 10/23/2017 12:42   Dg Foot Complete Right  Result Date: 10/22/2017 CLINICAL DATA:  Fall.  Twisted ankle EXAM: RIGHT FOOT COMPLETE - 3+ VIEW COMPARISON:  None. FINDINGS: There is no evidence of fracture or dislocation. There is no evidence of arthropathy or other focal bone abnormality. Soft tissues are unremarkable. IMPRESSION: Negative. Electronically Signed   By: Franchot Gallo M.D.   On: 10/22/2017 15:00    Cardiac Studies:  Assessment/Plan:  Probable small non-Q-wave myocardial infarction with atypical presentation Mild acute congestive heart failure Possible bilateral radiation-induced pneumonitis Marked leukocytosis questionable etiology Acute on chronic anemia questionable etiology Hypertension Diabetes mellitus Hyperlipidemia History of lacunar infarcts in the past History of adenocarcinoma of right lung status post radiation COPD History of bronchial asthma hypokalemia Plan Continue present management Replace K Medical management from cardiac point of view for now   LOS: 2 days    Charolette Forward 10/24/2017, 1:03 PM

## 2017-10-24 NOTE — Consult Note (Signed)
   [facility] CM Inpatient Consult   10/24/2017  CHER FRANZONI 1930/05/23 [id number]  Patient assessed for [place] [facility] in the Medicare ACO.  Met with the patient at the bedside, patient discussed in progress meeting that she has limited support at home.  Patient states she lives alone but her sister is near in verbal calling distance.  Her sister drives to appointments.  She uses [facility] for her medications.  She is not sure of the new medications but she feels right now she did need home visits.  Awaiting PT evaluation to assist in determining needs.  Patient given a brochure with information and contacts, encouraged her to call for needs.  Patient states, "Do you think that what they're finding is really my heart.  I came because my foot was swelling."  Explained that she could benefit from some education on diet and medications.  She states she will review the information. Will assign her to General EMMI call follow up.  For questions,   Natividad Brood, RN BSN [facility] Liaison  [phone] business mobile phone Toll free office [phone]

## 2017-10-24 NOTE — Progress Notes (Addendum)
  Speech Language Pathology Treatment: Dysphagia  Patient Details Name: Erin Carr MRN: [id number] DOB: [birth year] Today's Date: 10/24/2017 Time: 7902-4097 SLP Time Calculation (min) (ACUTE ONLY): 12 min  Assessment / Plan / Recommendation Clinical Impression  SLP facilitated session by providing skilled observation with lunch meal of regular textures with thin liquids via cup. Patient demonstrated mildly prolonged mastication of regular textures due to missing dentition. However, patient demonstrates appropriate awareness and safety with mastication and consumes meal at a slow rate. No overt s/s of aspiration were noted throughout meal with either solids or liquids. Therefore, recommend patient continue current diet and discharge from skilled SLP intervention due to all goals being met. Patient verbalized understanding and agreement.    HPI HPI: Erin Carr is a 83 y.o. female with medical history significant of hypertension, hyperlipidemia, diabetes mellitus, COPD, asthma, iron deficiency anemia, right lung non-small cell cancer (s/p of XTR), radiation pneumonitis, who presents with Leg swelling, shortness breath, left facial droop, right drop and fall. CXR showed: Bilateral airspace and interstitial opacities, most evident in the upper lobes, similar to the most recent prior exam. The findings have improved on the right when compared to a CT scan dated 08/07/2017. Residual lung opacities may be due to infection or inflammation. Edema is felt less likely particularly in the absence of pleural effusions.       SLP Plan  Discharge SLP treatment due to all goals being met        Recommendations  Diet recommendations: Regular;Thin liquid Liquids provided via: Cup Medication Administration: Whole meds with liquid Supervision: Patient able to self feed Compensations: Slow rate;Small sips/bites;Minimize environmental distractions Postural Changes and/or Swallow Maneuvers: Seated  upright 90 degrees                Oral Care Recommendations: Oral care BID Follow up Recommendations: None Plan: Discharge SLP treatment due to (comment)       [place], [place], CCC-SLP [phone]   [place], [place] 10/24/2017, 1:55 PM

## 2017-10-24 NOTE — Progress Notes (Signed)
  Echocardiogram 2D Echocardiogram has been performed.  Erin Carr 10/24/2017, 3:53 PM

## 2017-10-25 DIAGNOSIS — R748 Abnormal levels of other serum enzymes: Secondary | ICD-10-CM

## 2017-10-25 DIAGNOSIS — I5031 Acute diastolic (congestive) heart failure: Secondary | ICD-10-CM

## 2017-10-25 DIAGNOSIS — R29810 Facial weakness: Secondary | ICD-10-CM

## 2017-10-25 DIAGNOSIS — I214 Non-ST elevation (NSTEMI) myocardial infarction: Secondary | ICD-10-CM

## 2017-10-25 DIAGNOSIS — Z9114 Patient's other noncompliance with medication regimen: Secondary | ICD-10-CM

## 2017-10-25 LAB — BASIC METABOLIC PANEL
Anion gap: 6 (ref 5–15)
BUN: 9 mg/dL (ref 6–20)
CHLORIDE: 104 mmol/L (ref 101–111)
CO2: 25 mmol/L (ref 22–32)
CREATININE: 0.84 mg/dL (ref 0.44–1.00)
Calcium: 8.3 mg/dL — ABNORMAL LOW (ref 8.9–10.3)
GFR calc Af Amer: >60 mL/min (ref >60)
GFR calc non Af Amer: >60 mL/min (ref >60)
GLUCOSE: 176 mg/dL — AB (ref 65–99)
Potassium: 4 mmol/L (ref 3.5–5.1)
Sodium: 135 mmol/L (ref 135–145)

## 2017-10-25 LAB — CBC
HCT: 26.5 % — ABNORMAL LOW (ref 36.0–46.0)
Hemoglobin: 8.2 g/dL — ABNORMAL LOW (ref 12.0–15.0)
MCH: 23.9 pg — ABNORMAL LOW (ref 26.0–34.0)
MCHC: 30.9 g/dL (ref 30.0–36.0)
MCV: 77.3 fL — AB (ref 78.0–100.0)
PLATELETS: 378 10*3/uL (ref 150–400)
RBC: 3.43 MIL/uL — ABNORMAL LOW (ref 3.87–5.11)
RDW: 21.7 % — AB (ref 11.5–15.5)
WBC: 17.7 10*3/uL — AB (ref 4.0–10.5)

## 2017-10-25 LAB — GLUCOSE, CAPILLARY: Glucose-Capillary: 143 mg/dL — ABNORMAL HIGH (ref 65–99)

## 2017-10-25 MED ORDER — ATORVASTATIN CALCIUM 40 MG PO TABS: 40.0000 mg | ORAL_TABLET | Freq: Every day | ORAL | 2 refills | Status: DC

## 2017-10-25 MED ORDER — METOPROLOL TARTRATE 25 MG PO TABS: 12.5000 mg | ORAL_TABLET | Freq: Two times a day (BID) | ORAL | 2 refills | Status: DC

## 2017-10-25 NOTE — Progress Notes (Signed)
Subjective:  Doing well denies any chest pain or shortness of breath. Denies palpitation lightheadedness or syncope. States ambulated in room yesterday with the help of walker  Objective:  Vital Signs in the last 24 hours: Temp:  [98.4 F (36.9 C)-98.8 F (37.1 C)] 98.4 F (36.9 C) (11/22 0616) Pulse Rate:  [81-87] 81 (11/22 0616) Resp:  [18-20] 18 (11/22 0616) BP: (138-146)/(54-64) 146/64 (11/22 0616) SpO2:  [98 %-100 %] 98 % (11/22 0616) Weight:  [59.5 kg (131 lb 3.2 oz)] 59.5 kg (131 lb 3.2 oz) (11/22 0616)  Intake/Output from previous day: 11/21 0701 - 11/22 0700 In: 480 [P.O.:480] Out: 2250 [Urine:2250] Intake/Output from this shift: No intake/output data recorded.  Physical Exam: Neck: no adenopathy, no carotid bruit, no JVD and supple, symmetrical, trachea midline Lungs: clear to auscultation bilaterally Heart: regular rate and rhythm, S1, S2 normal and Soft systolic murmur noted Abdomen: soft, non-tender; bowel sounds normal; no masses,  no organomegaly Extremities: extremities normal, atraumatic, no cyanosis or edema  Lab Results: Recent Labs    10/24/17 0520 10/25/17 0325  WBC 19.9* 17.7*  HGB 8.1* 8.2*  PLT 394 378   Recent Labs    10/24/17 0520 10/25/17 0325  NA 134* 135  K 3.3* 4.0  CL 100* 104  CO2 25 25  GLUCOSE 100* 176*  BUN 9 9  CREATININE 0.93 0.84   Recent Labs    10/23/17 0930 10/24/17 0520  TROPONINI 2.01* 2.00*   Hepatic Function Panel Recent Labs    10/22/17 1424  PROT 6.0*  ALBUMIN 2.4*  AST 33  ALT 22  ALKPHOS 165*  BILITOT 0.6   Recent Labs    10/23/17 0333  CHOL 106   No results for input(s): PROTIME in the last 72 hours.  Imaging: Imaging results have been reviewed and Mr Lumbar Spine Wo Contrast  Result Date: 10/23/2017 CLINICAL DATA:  83 year old female status post fall several weeks ago. Back pain. Right foot drop. Pain in the right foot an ankle. Bilateral lower extremity swelling. Right lung cancer. EXAM:  MRI LUMBAR SPINE WITHOUT CONTRAST TECHNIQUE: Multiplanar, multisequence MR imaging of the lumbar spine was performed. No intravenous contrast was administered. COMPARISON:  PET-CT 06/26/2017, and earlier. Lumbar radiographs 06/21/2015. FINDINGS: Segmentation: Normal lumbar segmentation demonstrated on the comparison radiographs. Alignment:  Chronic straightening of lumbar lordosis. Vertebrae: Chronic degenerative endplate marrow signal changes throughout the lumbar spine, sparing L1-L2, with patchy associated marrow edema at L3-L4, L4-L5, and L5-S1. There is chronic complete loss of the disc spaces at these levels. On a 2011 CT Abdomen and Pelvis there was vacuum disc at each level. There is now trace increased T2 and STIR signal at each of these levels (series 4, image 6). There does appear to be mild medial psoas muscle inflammation bilaterally (e.g. On the right series 4, image 1 at the L4-L5 level. But there is no prevertebral or epidural space inflammation or fluid identified. Background bone marrow signal elsewhere is within normal limits. Intact visible sacrum and SI joints. Chronic inferior endplate Schmorl nodes at T11 and L1. Conus medullaris and cauda equina: Conus extends to the L1-L2 level. Conus and cauda equina appear normal. Paraspinal and other soft tissues: Benign right renal cyst again noted. Negative visualized posterior paraspinal soft tissues. Disc levels: Mild for age spinal degeneration from T10-T11 to L1-L2. L2-L3: Severe disc space loss with left eccentric circumferential disc osteophyte complex. Broad-based posterior component with mild facet and ligament flavum hypertrophy. Borderline to mild spinal stenosis. L3-L4: Severe  disc space loss with right eccentric circumferential disc osteophyte complex and broad-based posterior component. Mild to moderate facet hypertrophy. Mild spinal stenosis. Mild to moderate right L3 neural foraminal stenosis. L4-L5: Severe disc space loss with bulky  circumferential disc osteophyte complex. Broad-based posterior component. Mild to moderate facet and ligament flavum hypertrophy. Mild spinal stenosis. Mild left and mild to moderate right L4 foraminal stenosis. L5-S1: Severe disc space loss with circumferential disc osteophyte complex. Far lateral involvement primarily. Moderate facet and ligament flavum hypertrophy greater on the right. No spinal stenosis. Mild to moderate left and moderate right L5 foraminal stenosis. IMPRESSION: 1. Patchy endplate marrow edema throughout the lower lumbar spine L3-L4 through L5-S1 is associated with chronic complete disc space loss, trace fluid signal in the disc spaces, and mild psoas muscle inflammation. However, this constellation more resembles severe degenerative changes than multilevel infectious discitis-osteomyelitis, or lumbar metastatic disease. 2. Subsequent multilevel mild lumbar spinal stenosis and up to moderate L3 through L5 neural foraminal stenosis. Electronically Signed   By: Genevie Ann M.D.   On: 10/23/2017 12:42    Cardiac Studies:  Assessment/Plan:  Probable small non-Q-wave myocardial infarction with atypical presentation Mild acute congestive heart failure Possible bilateral radiation-induced pneumonitis Marked leukocytosis questionable etiology Acute on chronic anemia questionable etiology Hypertension Diabetes mellitus Hyperlipidemia History of lacunar infarcts in the past History of adenocarcinoma of right lung status post radiation COPD History of bronchial asthma . Plan Continue present management Okay to discharge from cardiac point of view Follow-up with me in one to 2 weeks   LOS: 3 days    Charolette Forward 10/25/2017, 10:44 AM

## 2017-10-25 NOTE — Discharge Summary (Signed)
Physician Discharge Summary  Erin Carr:[id number] DOB: 09/12/1930 DOA: 10/22/2017  PCP: Vincente Liberty, MD  Admit date: 10/22/2017 Discharge date: 10/25/2017  Admitted From: Home Disposition:  Home  Recommendations for Outpatient Follow-up:  1. Follow up with PCP in 1-2 weeks 2. Please obtain BMP/CBC in one week 3. Please follow up Neurology referral and re-refer if not completed within 2 weeks   Home Health: Yes Equipment/Devices: AFO brace for right foot  Discharge Condition: Improved  CODE STATUS: FULL Diet recommendation: Low sodium  Brief/Interim Summary: 83 yo F with HTN, DM, COPD, anemia, lung CA with radiation pneumonitis who presents with new CHF, NSTEMI and facial droop.      Acute on chronic diastolic Congestive heart failure EF by Echocardiogram 55-60%, no significant valvular disease. Diuresed with IV lasix, net negative 3L.    NSTEMI Troponin flat, Cardiology were consulted, and recommended medical mgmt, heparin for 48 hours, no Cath.  Statin and beta blocker were adjusted to Metoprolol, atorvastatin.  On aspirin.  Facial droop MRI brain showed no infarct.   Neurology referral placed in Epic, for follow up EMG/NCS.        Discharge Diagnoses:  Principal Problem:   Acute CHF (congestive heart failure) (HCC) Active Problems:   Adenocarcinoma of right lung, stage 1 (HCC)   Microcytic anemia   Type 2 diabetes mellitus without complication (HCC)   Foot drop   Facial droop   NSTEMI (non-ST elevated myocardial infarction) ([name])   Fall   Leukocytosis   Radiation pneumonitis [facility])    Discharge Instructions  Discharge Instructions    Diet - low sodium heart healthy   Complete by:  As directed    Discharge instructions   Complete by:  As directed    Resume taking your home Lasix Resume your home lisinopril (blood pressure medicine) Stop your home amlodipine/Norvasc Replace home labetalol with metoprolol Replace home  simvastatin/Zocor with atorvastatin/Lipitor  Resume your home metformin and other diabetes medicines Follow up with Dr. Terrence Dupont next week   Heart Failure patients record your daily weight using the same scale at the same time of day   Complete by:  As directed    Increase activity slowly   Complete by:  As directed      Allergies as of 10/25/2017   No Known Allergies     Medication List    STOP taking these medications   amLODipine 10 MG tablet Commonly known as:  NORVASC   labetalol 100 MG tablet Commonly known as:  NORMODYNE   propranolol 10 MG tablet Commonly known as:  INDERAL   simvastatin 20 MG tablet Commonly known as:  ZOCOR     TAKE these medications   albuterol 108 (90 Base) MCG/ACT inhaler Commonly known as:  PROVENTIL HFA;VENTOLIN HFA Inhale 1 puff into the lungs every 4 (four) hours as needed for wheezing or shortness of breath.   aspirin EC 81 MG tablet Take 81 mg by mouth daily.   atorvastatin 40 MG tablet Commonly known as:  LIPITOR Take 1 tablet (40 mg total) by mouth daily at 6 PM.   ergocalciferol 50000 units capsule Commonly known as:  VITAMIN D2 Take 50,000 Units by mouth once a week.   ferrous sulfate 325 (65 FE) MG tablet Take 1 tablet (325 mg total) by mouth daily.   furosemide 20 MG tablet Commonly known as:  LASIX Take 3 tablets (60 mg total) 2 (two) times daily by mouth.   gabapentin 400 MG capsule Commonly known  as:  NEURONTIN Take 400 mg by mouth 2 (two) times daily.   glipiZIDE 5 MG tablet Commonly known as:  GLUCOTROL Take 5 mg by mouth daily before breakfast.   lisinopril 20 MG tablet Commonly known as:  PRINIVIL,ZESTRIL Take 20 mg by mouth 2 (two) times daily.   metFORMIN 500 MG 24 hr tablet Commonly known as:  GLUCOPHAGE-XR Take 500 mg by mouth 2 (two) times daily.   metoprolol tartrate 25 MG tablet Commonly known as:  LOPRESSOR Take 0.5 tablets (12.5 mg total) by mouth 2 (two) times daily.   potassium chloride  10 MEQ tablet Commonly known as:  K-DUR Take 10 mEq by mouth daily.   TRAVATAN Z 0.004 % Soln ophthalmic solution Generic drug:  Travoprost (BAK Free) Place 1 drop into both eyes at bedtime.      Follow-up Information    Home, [facility] Follow up.   Specialty:  [place] Why:  They will do your home health care at your home Contact information: [address] [place] [address] [phone]          No Known Allergies  Consultations:  Cardiology   Procedures/Studies: Dg Chest 2 View  Result Date: 10/22/2017 CLINICAL DATA:  Weakness.  Edema. EXAM: CHEST  2 VIEW COMPARISON:  10/09/2017 FINDINGS: The cardiac silhouette is normal in size. No mediastinal or hilar masses. Airspace opacity extends laterally from the right hilum into the right upper lobe. There is irregular interstitial thickening bilaterally. Patchy hazy airspace opacities in noted more diffusely primarily in both upper lobes. This appearance is similar to the most recent prior exam. No pleural effusion.  No pneumothorax. Skeletal structures are demineralized but grossly intact. IMPRESSION: 1. Bilateral airspace and interstitial opacities, most evident in the upper lobes, similar to the most recent prior exam. The findings have improved on the right when compared to a CT scan dated 08/07/2017. Residual lung opacities may be due to infection or inflammation. Edema is felt less likely particularly in the absence of pleural effusions. Electronically Signed   By: Lajean Manes M.D.   On: 10/22/2017 17:26   Dg Chest 2 View  Result Date: 10/09/2017 CLINICAL DATA:  Productive cough. EXAM: CHEST  2 VIEW COMPARISON:  08/22/2017, 08/17/2017, 08/01/2017, 07/18/2017 and CT scan dated 05/16/2017 FINDINGS: There are new areas of infiltrate in the left lung apex and in the left midzone. There is persistent or recurrent infiltrate in the right midzone. Clearing of the patchy infiltrate at the right lung base.  Minimal bilateral pleural effusions with tenting of the diaphragm bilaterally, new on the left. Heart size and vascularity are normal. Aortic atherosclerosis. No acute bone abnormality. Moderate arthritis of the left glenohumeral joint. IMPRESSION: 1. New patchy infiltrates in the left lung apex in the left midzone with minimal bilateral pleural effusions. 2. New persistent or recurrent infiltrate in the right midzone. 3. Clearing of the infiltrate at the right lung base. 4. Aortic atherosclerosis. Electronically Signed   By: Lorriane Shire M.D.   On: 10/09/2017 17:26   Dg Ankle Complete Right  Result Date: 10/22/2017 CLINICAL DATA:  Fall twisted ankle EXAM: RIGHT ANKLE - COMPLETE 3+ VIEW COMPARISON:  None. FINDINGS: There is no evidence of fracture, dislocation, or joint effusion. There is no evidence of arthropathy or other focal bone abnormality. Soft tissues are unremarkable. Calcaneal spurring plantar surface IMPRESSION: Negative. Electronically Signed   By: Franchot Gallo M.D.   On: 10/22/2017 14:59   Ct Head Wo  Contrast  Result Date: 10/22/2017 CLINICAL DATA:  Frequent falls over the last 2 weeks. History of lung carcinoma. EXAM: CT HEAD WITHOUT CONTRAST TECHNIQUE: Contiguous axial images were obtained from the base of the skull through the vertex without intravenous contrast. COMPARISON:  None. FINDINGS: Brain: No evidence of acute infarction, hemorrhage, hydrocephalus, extra-axial collection or mass lesion/mass effect. There is ventricular and sulcal enlargement reflecting mild generalized atrophy. Vascular: No hyperdense vessel or unexpected calcification. Skull: Normal. Negative for fracture or focal lesion. Sinuses/Orbits: Globes and orbits are unremarkable. There is mild ethmoid and minor maxillary sinus mucosal thickening. Remaining sinuses and mastoid air cells are clear. Other: None. IMPRESSION: 1. No acute intracranial abnormalities. 2. Mild generalized atrophy. Electronically Signed   By:  Lajean Manes M.D.   On: 10/22/2017 17:28   Mr Brain Wo Contrast  Result Date: 10/23/2017 CLINICAL DATA:  Initial evaluation for new onset right foot drop. EXAM: MRI HEAD WITHOUT CONTRAST TECHNIQUE: Multiplanar, multiecho pulse sequences of the brain and surrounding structures were obtained without intravenous contrast. COMPARISON:  [facility] CT from earlier the same day. FINDINGS: Brain: Diffuse prominence of the CSF containing spaces compatible with generalized age related cerebral atrophy. Mild patchy and confluent T2/FLAIR hyperintensity within the periventricular and deep white matter both cerebral hemispheres, most consistent with chronic small vessel ischemic disease, mild for age. Remote lacunar infarcts present within the right basal ganglia, left thalamus, left paramedian pons, and right cerebellum. Chronic microhemorrhage associated with the chronic right cerebellar infarct noted. No abnormal foci of restricted diffusion to suggest acute or subacute ischemia. Faint punctate diffusion abnormality within the cortical gray matter of the right occipital lobe on axial DWI sequence not seen on corresponding sequences, and favored to be artifactual. Gray-white matter differentiation well maintained. No other areas of chronic infarction. No other evidence for chronic infarction. No mass lesion, midline shift or mass effect. No hydrocephalus. No extra-axial fluid collection. Major dural sinuses are grossly patent. No made of a partially empty sella. Midline structures intact and normal. Vascular: Major intracranial flow voids are maintained. Skull and upper cervical spine: Degenerative changes about the C1-2 articulation with thickening of the tectorial membrane, resulting in mild spinal stenosis at the craniocervical junction. Gender spondylolysis at C3-4 with resultant mild to moderate spinal stenosis. Bone marrow signal intensity within normal limits. No scalp soft tissue abnormality. Sinuses/Orbits: Globes  and orbital soft tissues within normal limits. Patient status post lens extraction on the right. Scattered mucosal thickening throughout the paranasal sinuses. No air-fluid level to suggest acute sinusitis. Mastoid air cells are clear. Inner ear structures normal. Other: None. IMPRESSION: 1. No acute intracranial abnormality identified. 2. Remote lacunar infarcts involving the right basal ganglia, left thalamus, left paramedian pons, and right cerebellum. 3. Mild for age chronic small vessel ischemic disease. 4. Degenerate spondylolysis within the upper cervical spine with mild-to-moderate stenosis at C3-4. Follow-up examination with dedicated MRI of the cervical spine could be performed for further evaluation as clinically warranted. Electronically Signed   By: Jeannine Boga M.D.   On: 10/23/2017 00:05   Mr Lumbar Spine Wo Contrast  Result Date: 10/23/2017 CLINICAL DATA:  83 year old female status post fall several weeks ago. Back pain. Right foot drop. Pain in the right foot an ankle. Bilateral lower extremity swelling. Right lung cancer. EXAM: MRI LUMBAR SPINE WITHOUT CONTRAST TECHNIQUE: Multiplanar, multisequence MR imaging of the lumbar spine was performed. No intravenous contrast was administered. COMPARISON:  PET-CT 06/26/2017, and earlier. Lumbar radiographs 06/21/2015. FINDINGS: Segmentation: Normal lumbar segmentation  demonstrated on the comparison radiographs. Alignment:  Chronic straightening of lumbar lordosis. Vertebrae: Chronic degenerative endplate marrow signal changes throughout the lumbar spine, sparing L1-L2, with patchy associated marrow edema at L3-L4, L4-L5, and L5-S1. There is chronic complete loss of the disc spaces at these levels. On a 2011 CT Abdomen and Pelvis there was vacuum disc at each level. There is now trace increased T2 and STIR signal at each of these levels (series 4, image 6). There does appear to be mild medial psoas muscle inflammation bilaterally (e.g. On the  right series 4, image 1 at the L4-L5 level. But there is no prevertebral or epidural space inflammation or fluid identified. Background bone marrow signal elsewhere is within normal limits. Intact visible sacrum and SI joints. Chronic inferior endplate Schmorl nodes at T11 and L1. Conus medullaris and cauda equina: Conus extends to the L1-L2 level. Conus and cauda equina appear normal. Paraspinal and other soft tissues: Benign right renal cyst again noted. Negative visualized posterior paraspinal soft tissues. Disc levels: Mild for age spinal degeneration from T10-T11 to L1-L2. L2-L3: Severe disc space loss with left eccentric circumferential disc osteophyte complex. Broad-based posterior component with mild facet and ligament flavum hypertrophy. Borderline to mild spinal stenosis. L3-L4: Severe disc space loss with right eccentric circumferential disc osteophyte complex and broad-based posterior component. Mild to moderate facet hypertrophy. Mild spinal stenosis. Mild to moderate right L3 neural foraminal stenosis. L4-L5: Severe disc space loss with bulky circumferential disc osteophyte complex. Broad-based posterior component. Mild to moderate facet and ligament flavum hypertrophy. Mild spinal stenosis. Mild left and mild to moderate right L4 foraminal stenosis. L5-S1: Severe disc space loss with circumferential disc osteophyte complex. Far lateral involvement primarily. Moderate facet and ligament flavum hypertrophy greater on the right. No spinal stenosis. Mild to moderate left and moderate right L5 foraminal stenosis. IMPRESSION: 1. Patchy endplate marrow edema throughout the lower lumbar spine L3-L4 through L5-S1 is associated with chronic complete disc space loss, trace fluid signal in the disc spaces, and mild psoas muscle inflammation. However, this constellation more resembles severe degenerative changes than multilevel infectious discitis-osteomyelitis, or lumbar metastatic disease. 2. Subsequent  multilevel mild lumbar spinal stenosis and up to moderate L3 through L5 neural foraminal stenosis. Electronically Signed   By: Genevie Ann M.D.   On: 10/23/2017 12:42   Dg Foot Complete Right  Result Date: 10/22/2017 CLINICAL DATA:  Fall.  Twisted ankle EXAM: RIGHT FOOT COMPLETE - 3+ VIEW COMPARISON:  None. FINDINGS: There is no evidence of fracture or dislocation. There is no evidence of arthropathy or other focal bone abnormality. Soft tissues are unremarkable. IMPRESSION: Negative. Electronically Signed   By: Franchot Gallo M.D.   On: 10/22/2017 15:00       Subjective: Feels well, eager to go home.  NO new chest pain, leg swelling, dyspnea.  Able to ambulate in the hall with PT.    Discharge Exam: Vitals:   10/24/17 2344 10/25/17 0616  BP: (!) 138/54 (!) 146/64  Pulse: 81 81  Resp: 19 18  Temp: 98.8 F (37.1 C) 98.4 F (36.9 C)  SpO2: 100% 98%   Vitals:   10/24/17 1208 10/24/17 2015 10/24/17 2344 10/25/17 0616  BP: (!) 146/61 (!) 140/54 (!) 138/54 (!) 146/64  Pulse: 86 87 81 81  Resp:  20 19 18   Temp: 98.6 F (37 C) 98.6 F (37 C) 98.8 F (37.1 C) 98.4 F (36.9 C)  TempSrc: Oral Oral Oral Oral  SpO2: 100% 100% 100% 98%  Weight:    59.5 kg (131 lb 3.2 oz)  Height:        General: Pt is alert, awake, not in acute distress Cardiovascular: RRR, S1/S2 +, no rubs, no gallops Respiratory: CTA bilaterally, no wheezing, no rhonchi Abdominal: Soft, NT, ND, bowel sounds + Extremities: no edema, no cyanosis    The results of significant diagnostics from this hospitalization (including imaging, microbiology, ancillary and laboratory) are listed below for reference.     Microbiology: Recent Results (from the past 240 hour(s))  Culture, blood (Routine X 2) w Reflex to ID Panel     Status: None (Preliminary result)   Collection Time: 10/22/17  9:42 PM  Result Value Ref Range Status   Specimen Description BLOOD RIGHT ANTECUBITAL  Final   Special Requests   Final    BOTTLES  DRAWN AEROBIC AND ANAEROBIC Blood Culture adequate volume   Culture NO GROWTH 2 DAYS  Final   Report Status PENDING  Incomplete  Culture, blood (Routine X 2) w Reflex to ID Panel     Status: None (Preliminary result)   Collection Time: 10/22/17  9:50 PM  Result Value Ref Range Status   Specimen Description BLOOD LEFT ARM  Final   Special Requests   Final    BOTTLES DRAWN AEROBIC ONLY Blood Culture results may not be optimal due to an inadequate volume of blood received in culture bottles   Culture NO GROWTH 2 DAYS  Final   Report Status PENDING  Incomplete     Labs: BNP (last 3 results) Recent Labs    10/09/17 1425 10/22/17 1424  BNP 308.2* 025.8*   Basic Metabolic Panel: Recent Labs  Lab 10/22/17 1424 10/23/17 0333 10/24/17 0520 10/25/17 0325  NA 132* 134* 134* 135  K 3.7 3.3* 3.3* 4.0  CL 100* 101 100* 104  CO2 23 25 25 25   GLUCOSE 88 77 100* 176*  BUN 13 10 9 9   CREATININE 1.04* 0.87 0.93 0.84  CALCIUM 8.5* 8.3* 8.3* 8.3*   Liver Function Tests: Recent Labs  Lab 10/22/17 1424  AST 33  ALT 22  ALKPHOS 165*  BILITOT 0.6  PROT 6.0*  ALBUMIN 2.4*   No results for input(s): LIPASE, AMYLASE in the last 168 hours. No results for input(s): AMMONIA in the last 168 hours. CBC: Recent Labs  Lab 10/22/17 1424 10/23/17 0333 10/24/17 0520 10/25/17 0325  WBC 23.8* 21.3* 19.9* 17.7*  NEUTROABS 8.6*  --   --   --   HGB 7.7* 7.7* 8.1* 8.2*  HCT 25.9* 25.7* 26.2* 26.5*  MCV 76.9* 76.7* 77.7* 77.3*  PLT 345 367 394 378   Cardiac Enzymes: Recent Labs  Lab 10/22/17 1424 10/22/17 2134 10/23/17 0333 10/23/17 0930 10/24/17 0520  TROPONINI 1.77* 1.90* 2.08* 2.01* 2.00*   BNP: Invalid input(s): POCBNP CBG: Recent Labs  Lab 10/24/17 0749 10/24/17 1129 10/24/17 1611 10/24/17 2058 10/25/17 0823  GLUCAP 90 122* 169* 185* 143*   D-Dimer No results for input(s): DDIMER in the last 72 hours. Hgb A1c Recent Labs    10/23/17 0333  HGBA1C 4.8   Lipid  Profile Recent Labs    10/23/17 0333  CHOL 106  HDL 43  LDLCALC 49  TRIG 68  CHOLHDL 2.5   Thyroid function studies No results for input(s): TSH, T4TOTAL, T3FREE, THYROIDAB in the last 72 hours.  Invalid input(s): FREET3 Anemia work up Recent Labs    10/24/17 0520  FERRITIN 181  TIBC 224*  IRON 21*   Urinalysis  Component Value Date/Time   COLORURINE YELLOW 10/22/2017 1745   APPEARANCEUR CLEAR 10/22/2017 1745   LABSPEC 1.008 10/22/2017 1745   PHURINE 7.0 10/22/2017 1745   GLUCOSEU NEGATIVE 10/22/2017 1745   HGBUR NEGATIVE 10/22/2017 1745   BILIRUBINUR NEGATIVE 10/22/2017 1745   KETONESUR NEGATIVE 10/22/2017 1745   PROTEINUR NEGATIVE 10/22/2017 1745   NITRITE NEGATIVE 10/22/2017 1745   LEUKOCYTESUR NEGATIVE 10/22/2017 1745   Sepsis Labs Invalid input(s): PROCALCITONIN,  WBC,  LACTICIDVEN Microbiology Recent Results (from the past 240 hour(s))  Culture, blood (Routine X 2) w Reflex to ID Panel     Status: None (Preliminary result)   Collection Time: 10/22/17  9:42 PM  Result Value Ref Range Status   Specimen Description BLOOD RIGHT ANTECUBITAL  Final   Special Requests   Final    BOTTLES DRAWN AEROBIC AND ANAEROBIC Blood Culture adequate volume   Culture NO GROWTH 2 DAYS  Final   Report Status PENDING  Incomplete  Culture, blood (Routine X 2) w Reflex to ID Panel     Status: None (Preliminary result)   Collection Time: 10/22/17  9:50 PM  Result Value Ref Range Status   Specimen Description BLOOD LEFT ARM  Final   Special Requests   Final    BOTTLES DRAWN AEROBIC ONLY Blood Culture results may not be optimal due to an inadequate volume of blood received in culture bottles   Culture NO GROWTH 2 DAYS  Final   Report Status PENDING  Incomplete     Time coordinating discharge: Over 30 minutes  SIGNED:   Edwin Dada, MD  [facility] 10/25/2017, 10:19 AM   If 7PM-7AM, please contact night-coverage [email or web address] Password [email or web address]

## 2017-10-27 LAB — CULTURE, BLOOD (ROUTINE X 2)
CULTURE: NO GROWTH
CULTURE: NO GROWTH
SPECIAL REQUESTS: ADEQUATE

## 2017-10-29 DIAGNOSIS — Z79899 Other long term (current) drug therapy: Secondary | ICD-10-CM | POA: Diagnosis not present

## 2017-10-29 DIAGNOSIS — C3491 Malignant neoplasm of unspecified part of right bronchus or lung: Secondary | ICD-10-CM | POA: Diagnosis not present

## 2017-10-29 DIAGNOSIS — G8311 Monoplegia of lower limb affecting right dominant side: Secondary | ICD-10-CM | POA: Diagnosis not present

## 2017-10-29 DIAGNOSIS — D638 Anemia in other chronic diseases classified elsewhere: Secondary | ICD-10-CM | POA: Diagnosis not present

## 2017-10-29 DIAGNOSIS — E7800 Pure hypercholesterolemia, unspecified: Secondary | ICD-10-CM | POA: Diagnosis not present

## 2017-10-29 DIAGNOSIS — E1165 Type 2 diabetes mellitus with hyperglycemia: Secondary | ICD-10-CM | POA: Diagnosis not present

## 2017-10-29 DIAGNOSIS — R0602 Shortness of breath: Secondary | ICD-10-CM | POA: Diagnosis not present

## 2017-10-29 DIAGNOSIS — E1140 Type 2 diabetes mellitus with diabetic neuropathy, unspecified: Secondary | ICD-10-CM | POA: Diagnosis not present

## 2017-10-29 DIAGNOSIS — E113219 Type 2 diabetes mellitus with mild nonproliferative diabetic retinopathy with macular edema, unspecified eye: Secondary | ICD-10-CM | POA: Diagnosis not present

## 2017-10-29 DIAGNOSIS — J449 Chronic obstructive pulmonary disease, unspecified: Secondary | ICD-10-CM | POA: Diagnosis not present

## 2017-10-29 DIAGNOSIS — J4520 Mild intermittent asthma, uncomplicated: Secondary | ICD-10-CM | POA: Diagnosis not present

## 2017-11-01 ENCOUNTER — Other Ambulatory Visit: Payer: Self-pay | Admitting: *Deleted

## 2017-11-01 ENCOUNTER — Ambulatory Visit (HOSPITAL_COMMUNITY): Payer: Medicare Other

## 2017-11-01 NOTE — Patient Outreach (Signed)
[name] Care Management  11/01/2017  Erin Carr 10-03-1930 [id number]  Referral via EMMI-Heart Failure-[place] Alert/Day# 2, 10/31/2017; Reason: Weighed today:  Chart Review: Recent admission 11/19-11/22/2018 New CHF, NSTEMI, facial droop Hx: HTN, DM, COPD, anemia,Lung CA  Telephone call #1 to patient; phone rings with no answer; unable to leave message.  Plan:  Will follow up.  [name], RN BSN [place] Management Coordinator [facility] Care Management  [phone]

## 2017-11-02 ENCOUNTER — Encounter: Payer: Self-pay | Admitting: *Deleted

## 2017-11-02 ENCOUNTER — Other Ambulatory Visit: Payer: Self-pay | Admitting: *Deleted

## 2017-11-02 DIAGNOSIS — I25118 Atherosclerotic heart disease of native coronary artery with other forms of angina pectoris: Secondary | ICD-10-CM | POA: Diagnosis not present

## 2017-11-02 DIAGNOSIS — E785 Hyperlipidemia, unspecified: Secondary | ICD-10-CM | POA: Diagnosis not present

## 2017-11-02 DIAGNOSIS — I119 Hypertensive heart disease without heart failure: Secondary | ICD-10-CM | POA: Diagnosis not present

## 2017-11-02 DIAGNOSIS — C3491 Malignant neoplasm of unspecified part of right bronchus or lung: Secondary | ICD-10-CM | POA: Diagnosis not present

## 2017-11-02 DIAGNOSIS — M1990 Unspecified osteoarthritis, unspecified site: Secondary | ICD-10-CM | POA: Diagnosis not present

## 2017-11-02 DIAGNOSIS — I214 Non-ST elevation (NSTEMI) myocardial infarction: Secondary | ICD-10-CM | POA: Diagnosis not present

## 2017-11-02 DIAGNOSIS — I252 Old myocardial infarction: Secondary | ICD-10-CM | POA: Diagnosis not present

## 2017-11-02 DIAGNOSIS — D649 Anemia, unspecified: Secondary | ICD-10-CM | POA: Diagnosis not present

## 2017-11-02 DIAGNOSIS — I110 Hypertensive heart disease with heart failure: Secondary | ICD-10-CM | POA: Diagnosis not present

## 2017-11-02 DIAGNOSIS — J449 Chronic obstructive pulmonary disease, unspecified: Secondary | ICD-10-CM | POA: Diagnosis not present

## 2017-11-02 DIAGNOSIS — F17290 Nicotine dependence, other tobacco product, uncomplicated: Secondary | ICD-10-CM | POA: Diagnosis not present

## 2017-11-02 DIAGNOSIS — I509 Heart failure, unspecified: Secondary | ICD-10-CM | POA: Diagnosis not present

## 2017-11-02 DIAGNOSIS — E119 Type 2 diabetes mellitus without complications: Secondary | ICD-10-CM | POA: Diagnosis not present

## 2017-11-02 NOTE — Patient Outreach (Signed)
[facility]) Care Management  11/02/2017  Erin Carr 07/18/1930 [id number]  Referral via [place] Alert/Day# 2, 10/31/2017; Reason: Weighed today: no  Chart Review: Recent admission 11/19-11/22/2018 New CHF, NSTEMI, facial droop Hx: HTN, DM, COPD, anemia,Lung CA  Referral from [place] -Day#3, 11/01/2017;  Reason: weighed today-no  Call# 2 Telephone call to patient; phone rings with no answer; unable to leave message.   Plan: Geophysicist/field seismologist. Will follow up.  [name], RN BSN [place] Management Coordinator [facility] Care Management  [phone]

## 2017-11-04 DIAGNOSIS — I214 Non-ST elevation (NSTEMI) myocardial infarction: Secondary | ICD-10-CM | POA: Diagnosis not present

## 2017-11-04 DIAGNOSIS — I509 Heart failure, unspecified: Secondary | ICD-10-CM | POA: Diagnosis not present

## 2017-11-04 DIAGNOSIS — C3491 Malignant neoplasm of unspecified part of right bronchus or lung: Secondary | ICD-10-CM | POA: Diagnosis not present

## 2017-11-04 DIAGNOSIS — I110 Hypertensive heart disease with heart failure: Secondary | ICD-10-CM | POA: Diagnosis not present

## 2017-11-04 DIAGNOSIS — J449 Chronic obstructive pulmonary disease, unspecified: Secondary | ICD-10-CM | POA: Diagnosis not present

## 2017-11-04 DIAGNOSIS — E119 Type 2 diabetes mellitus without complications: Secondary | ICD-10-CM | POA: Diagnosis not present

## 2017-11-05 ENCOUNTER — Other Ambulatory Visit: Payer: Self-pay | Admitting: *Deleted

## 2017-11-05 DIAGNOSIS — I214 Non-ST elevation (NSTEMI) myocardial infarction: Secondary | ICD-10-CM | POA: Diagnosis not present

## 2017-11-05 DIAGNOSIS — I509 Heart failure, unspecified: Secondary | ICD-10-CM | POA: Diagnosis not present

## 2017-11-05 DIAGNOSIS — I110 Hypertensive heart disease with heart failure: Secondary | ICD-10-CM | POA: Diagnosis not present

## 2017-11-05 DIAGNOSIS — E119 Type 2 diabetes mellitus without complications: Secondary | ICD-10-CM | POA: Diagnosis not present

## 2017-11-05 DIAGNOSIS — C3491 Malignant neoplasm of unspecified part of right bronchus or lung: Secondary | ICD-10-CM | POA: Diagnosis not present

## 2017-11-05 DIAGNOSIS — J449 Chronic obstructive pulmonary disease, unspecified: Secondary | ICD-10-CM | POA: Diagnosis not present

## 2017-11-05 NOTE — Telephone Encounter (Signed)
This encounter was created in error - please disregard.

## 2017-11-05 NOTE — Patient Outreach (Signed)
[name]) Care Management  11/05/2017  Erin Carr August 17, 1930 [id number]  Referral via [place] Alert/Day# 2, 10/31/2017; Reason: Weighed today: no  Chart Review: Recent admission 11/19-11/22/2018 New CHF, NSTEMI, facial droop Hx: HTN, DM, COPD, anemia,Lung CA  Referral from [place] -Day#3, 11/01/2017;  Reason: weighed today-no  Call # 3 to patient who was advised of reason for call. HIPPA verification received from patient. Patient voices she had not weighed because she had not gotten scales yet. States she got scales this past weekend & started to weight today. States weight was 135. States she does not remember previous weight.    . States she is aware to call MD office if she gains 3-4 lbs in 1-2 days. States she is recording weight. Voices that she has had follow up doctor's appointment with heart specialist & primary care provider. States sister provides transportation to MD appointments. States taking medications as prescribed.  Patient voices that home health services are in place & visits have started. States she does not need further educational literature on heart failure.    Emmi call completed.  Plan: Close out/send to care management assistant.  Erin Daisy, RN BSN [place] Management Coordinator [facility] Care Management  [phone]

## 2017-11-05 NOTE — Addendum Note (Signed)
Addended by: Dickie La on: 11/05/2017 05:12 PM   Modules accepted: Level of Service, SmartSet

## 2017-11-06 ENCOUNTER — Ambulatory Visit (HOSPITAL_COMMUNITY)
Admission: RE | Admit: 2017-11-06 | Discharge: 2017-11-06 | Disposition: A | Discharge status: Home / Self Care | Payer: Medicare Other | Source: Ambulatory Visit | Attending: Internal Medicine | Admitting: Internal Medicine

## 2017-11-06 DIAGNOSIS — C3491 Malignant neoplasm of unspecified part of right bronchus or lung: Secondary | ICD-10-CM | POA: Insufficient documentation

## 2017-11-06 DIAGNOSIS — J90 Pleural effusion, not elsewhere classified: Secondary | ICD-10-CM | POA: Insufficient documentation

## 2017-11-06 DIAGNOSIS — I700 Atherosclerosis of aorta: Secondary | ICD-10-CM | POA: Insufficient documentation

## 2017-11-06 DIAGNOSIS — D381 Neoplasm of uncertain behavior of trachea, bronchus and lung: Secondary | ICD-10-CM

## 2017-11-06 DIAGNOSIS — I2510 Atherosclerotic heart disease of native coronary artery without angina pectoris: Secondary | ICD-10-CM | POA: Diagnosis not present

## 2017-11-06 DIAGNOSIS — J439 Emphysema, unspecified: Secondary | ICD-10-CM | POA: Insufficient documentation

## 2017-11-06 DIAGNOSIS — I2584 Coronary atherosclerosis due to calcified coronary lesion: Secondary | ICD-10-CM | POA: Diagnosis not present

## 2017-11-06 MED ORDER — IOPAMIDOL (ISOVUE-300) INJECTION 61%
INTRAVENOUS | Status: AC
  Administered 2017-11-06: 75 mL
  Filled 2017-11-06: qty 75

## 2017-11-07 DIAGNOSIS — I509 Heart failure, unspecified: Secondary | ICD-10-CM | POA: Diagnosis not present

## 2017-11-07 DIAGNOSIS — C3491 Malignant neoplasm of unspecified part of right bronchus or lung: Secondary | ICD-10-CM | POA: Diagnosis not present

## 2017-11-07 DIAGNOSIS — I214 Non-ST elevation (NSTEMI) myocardial infarction: Secondary | ICD-10-CM | POA: Diagnosis not present

## 2017-11-07 DIAGNOSIS — J449 Chronic obstructive pulmonary disease, unspecified: Secondary | ICD-10-CM | POA: Diagnosis not present

## 2017-11-07 DIAGNOSIS — I110 Hypertensive heart disease with heart failure: Secondary | ICD-10-CM | POA: Diagnosis not present

## 2017-11-07 DIAGNOSIS — E119 Type 2 diabetes mellitus without complications: Secondary | ICD-10-CM | POA: Diagnosis not present

## 2017-11-08 ENCOUNTER — Ambulatory Visit (INDEPENDENT_AMBULATORY_CARE_PROVIDER_SITE_OTHER): Payer: Medicare Other | Admitting: Podiatry

## 2017-11-08 ENCOUNTER — Ambulatory Visit (INDEPENDENT_AMBULATORY_CARE_PROVIDER_SITE_OTHER): Payer: Medicare Other

## 2017-11-08 ENCOUNTER — Other Ambulatory Visit: Payer: Self-pay | Admitting: *Deleted

## 2017-11-08 ENCOUNTER — Encounter: Payer: Self-pay | Admitting: Podiatry

## 2017-11-08 ENCOUNTER — Other Ambulatory Visit: Payer: Self-pay | Admitting: Podiatry

## 2017-11-08 DIAGNOSIS — M21371 Foot drop, right foot: Secondary | ICD-10-CM | POA: Diagnosis not present

## 2017-11-08 DIAGNOSIS — M79671 Pain in right foot: Secondary | ICD-10-CM

## 2017-11-08 NOTE — Progress Notes (Signed)
   Subjective:    Patient ID: Erin Carr, female    DOB: Apr 10, 1930, 83 y.o.   MRN: [id number]  HPI    Review of Systems  All other systems reviewed and are negative.      Objective:   Physical Exam        Assessment & Plan:

## 2017-11-08 NOTE — Progress Notes (Signed)
Subjective:   Patient ID: Erin Carr, female   DOB: 83 y.o.   MRN: [id number]   HPI Patient stated that she had a stroke and that she has developed a foot drop right and she cannot walk properly.  States she feels very unsteady and that this is just occurred in the last 2 months.  She presents with caregiver today   Review of Systems  All other systems reviewed and are negative.       Objective:  Physical Exam  Constitutional: She appears well-developed and well-nourished.  Cardiovascular: Intact distal pulses.  Pulmonary/Chest: Effort normal.  Musculoskeletal: Normal range of motion.  Neurological: She is alert.  Skin: Skin is warm.  Nursing note and vitals reviewed.   Neurovascular status found to be intact muscle strength adequate range of motion within normal limits.  Patient is noted to have complete loss of her anterior tibial tendon and extensor function right with complete foot drop and is using a walker but is still unsteady.  Patient has good digital perfusion and is well oriented x3 and does not smoke and is not able to be active.     Assessment:  Acute foot drop right leading to chronic instability of the right foot and lower leg.     Plan:  X-rays taken condition reviewed and I have recommended at this time a brace for foot drop.  Patient wants to have a brace made and she will be scheduled with risk to have this done and I did explain the type of brace will utilize in order to address her foot drop deformity.  X-rays indicate that there is a quite a bit of arthritis in the subtalar midtarsal joint with no indications of acute fracture or other advanced arthritic condition

## 2017-11-08 NOTE — Patient Outreach (Signed)
[facility]) Care Management  11/08/2017  Erin Carr [birth year] [id number]  Referral via [place] on Day#9 11/07/2017;  Reason: know why she was taking meds/ no  Telephone call to patient who was advised of reason for call. HIPPA verification received.  Patient voices that she manages her medications & takes as prescribed. States she has 2 new prescriptions that she is taking but does not know why.   Patient agreed to review her medications with this care coordinator.  Medication review completed & patient was advised of purposes of all of medications. Patient voices understanding.   States has ben weighing daily sent she purchased scales, States she knows when to notify MD based on weight gain.  States she has no further concerns. EMMI-Call completed.  Plan: Send to care management assistant to close case.  [name], RN BSN [place] Management Coordinator [facility] Care Management  [phone]

## 2017-11-09 DIAGNOSIS — I110 Hypertensive heart disease with heart failure: Secondary | ICD-10-CM | POA: Diagnosis not present

## 2017-11-09 DIAGNOSIS — I214 Non-ST elevation (NSTEMI) myocardial infarction: Secondary | ICD-10-CM | POA: Diagnosis not present

## 2017-11-09 DIAGNOSIS — J449 Chronic obstructive pulmonary disease, unspecified: Secondary | ICD-10-CM | POA: Diagnosis not present

## 2017-11-09 DIAGNOSIS — C3491 Malignant neoplasm of unspecified part of right bronchus or lung: Secondary | ICD-10-CM | POA: Diagnosis not present

## 2017-11-09 DIAGNOSIS — E119 Type 2 diabetes mellitus without complications: Secondary | ICD-10-CM | POA: Diagnosis not present

## 2017-11-09 DIAGNOSIS — I509 Heart failure, unspecified: Secondary | ICD-10-CM | POA: Diagnosis not present

## 2017-11-14 DIAGNOSIS — E119 Type 2 diabetes mellitus without complications: Secondary | ICD-10-CM | POA: Diagnosis not present

## 2017-11-14 DIAGNOSIS — I110 Hypertensive heart disease with heart failure: Secondary | ICD-10-CM | POA: Diagnosis not present

## 2017-11-14 DIAGNOSIS — I509 Heart failure, unspecified: Secondary | ICD-10-CM | POA: Diagnosis not present

## 2017-11-14 DIAGNOSIS — J449 Chronic obstructive pulmonary disease, unspecified: Secondary | ICD-10-CM | POA: Diagnosis not present

## 2017-11-14 DIAGNOSIS — C3491 Malignant neoplasm of unspecified part of right bronchus or lung: Secondary | ICD-10-CM | POA: Diagnosis not present

## 2017-11-14 DIAGNOSIS — I214 Non-ST elevation (NSTEMI) myocardial infarction: Secondary | ICD-10-CM | POA: Diagnosis not present

## 2017-11-15 DIAGNOSIS — I110 Hypertensive heart disease with heart failure: Secondary | ICD-10-CM | POA: Diagnosis not present

## 2017-11-15 DIAGNOSIS — I509 Heart failure, unspecified: Secondary | ICD-10-CM | POA: Diagnosis not present

## 2017-11-15 DIAGNOSIS — C3491 Malignant neoplasm of unspecified part of right bronchus or lung: Secondary | ICD-10-CM | POA: Diagnosis not present

## 2017-11-15 DIAGNOSIS — E119 Type 2 diabetes mellitus without complications: Secondary | ICD-10-CM | POA: Diagnosis not present

## 2017-11-15 DIAGNOSIS — I214 Non-ST elevation (NSTEMI) myocardial infarction: Secondary | ICD-10-CM | POA: Diagnosis not present

## 2017-11-15 DIAGNOSIS — J449 Chronic obstructive pulmonary disease, unspecified: Secondary | ICD-10-CM | POA: Diagnosis not present

## 2017-11-16 DIAGNOSIS — E119 Type 2 diabetes mellitus without complications: Secondary | ICD-10-CM | POA: Diagnosis not present

## 2017-11-16 DIAGNOSIS — J449 Chronic obstructive pulmonary disease, unspecified: Secondary | ICD-10-CM | POA: Diagnosis not present

## 2017-11-16 DIAGNOSIS — C3491 Malignant neoplasm of unspecified part of right bronchus or lung: Secondary | ICD-10-CM | POA: Diagnosis not present

## 2017-11-16 DIAGNOSIS — I110 Hypertensive heart disease with heart failure: Secondary | ICD-10-CM | POA: Diagnosis not present

## 2017-11-16 DIAGNOSIS — I509 Heart failure, unspecified: Secondary | ICD-10-CM | POA: Diagnosis not present

## 2017-11-16 DIAGNOSIS — I214 Non-ST elevation (NSTEMI) myocardial infarction: Secondary | ICD-10-CM | POA: Diagnosis not present

## 2017-11-19 ENCOUNTER — Other Ambulatory Visit: Payer: Medicare Other

## 2017-11-19 DIAGNOSIS — I214 Non-ST elevation (NSTEMI) myocardial infarction: Secondary | ICD-10-CM | POA: Diagnosis not present

## 2017-11-19 DIAGNOSIS — I509 Heart failure, unspecified: Secondary | ICD-10-CM | POA: Diagnosis not present

## 2017-11-19 DIAGNOSIS — C3491 Malignant neoplasm of unspecified part of right bronchus or lung: Secondary | ICD-10-CM | POA: Diagnosis not present

## 2017-11-19 DIAGNOSIS — I110 Hypertensive heart disease with heart failure: Secondary | ICD-10-CM | POA: Diagnosis not present

## 2017-11-19 DIAGNOSIS — E119 Type 2 diabetes mellitus without complications: Secondary | ICD-10-CM | POA: Diagnosis not present

## 2017-11-19 DIAGNOSIS — J449 Chronic obstructive pulmonary disease, unspecified: Secondary | ICD-10-CM | POA: Diagnosis not present

## 2017-11-20 DIAGNOSIS — I509 Heart failure, unspecified: Secondary | ICD-10-CM | POA: Diagnosis not present

## 2017-11-20 DIAGNOSIS — I110 Hypertensive heart disease with heart failure: Secondary | ICD-10-CM | POA: Diagnosis not present

## 2017-11-20 DIAGNOSIS — J449 Chronic obstructive pulmonary disease, unspecified: Secondary | ICD-10-CM | POA: Diagnosis not present

## 2017-11-20 DIAGNOSIS — C3491 Malignant neoplasm of unspecified part of right bronchus or lung: Secondary | ICD-10-CM | POA: Diagnosis not present

## 2017-11-20 DIAGNOSIS — I214 Non-ST elevation (NSTEMI) myocardial infarction: Secondary | ICD-10-CM | POA: Diagnosis not present

## 2017-11-20 DIAGNOSIS — E119 Type 2 diabetes mellitus without complications: Secondary | ICD-10-CM | POA: Diagnosis not present

## 2017-11-21 ENCOUNTER — Ambulatory Visit: Payer: Medicare Other | Admitting: Oncology

## 2017-11-21 ENCOUNTER — Ambulatory Visit: Payer: Medicare Other | Admitting: Orthotics

## 2017-11-21 DIAGNOSIS — C3491 Malignant neoplasm of unspecified part of right bronchus or lung: Secondary | ICD-10-CM | POA: Diagnosis not present

## 2017-11-21 DIAGNOSIS — J449 Chronic obstructive pulmonary disease, unspecified: Secondary | ICD-10-CM | POA: Diagnosis not present

## 2017-11-21 DIAGNOSIS — I214 Non-ST elevation (NSTEMI) myocardial infarction: Secondary | ICD-10-CM | POA: Diagnosis not present

## 2017-11-21 DIAGNOSIS — E119 Type 2 diabetes mellitus without complications: Secondary | ICD-10-CM | POA: Diagnosis not present

## 2017-11-21 DIAGNOSIS — M79671 Pain in right foot: Secondary | ICD-10-CM

## 2017-11-21 DIAGNOSIS — M21371 Foot drop, right foot: Secondary | ICD-10-CM

## 2017-11-21 DIAGNOSIS — I509 Heart failure, unspecified: Secondary | ICD-10-CM | POA: Diagnosis not present

## 2017-11-21 DIAGNOSIS — I110 Hypertensive heart disease with heart failure: Secondary | ICD-10-CM | POA: Diagnosis not present

## 2017-11-21 NOTE — Progress Notes (Signed)
Patient presents today for evaluation/casting for AFO brace (L).   Patient has hx of the following conditions: Gait instability,  Ankle instabilty,  Gait analysis done and patient displays abnormality of gait in both sagittial and frontal planes, and could benefit in aggressive ankle support.  Patient chose [place] brace w/ lace/speed laces. As well as dorsi

## 2017-11-22 DIAGNOSIS — I214 Non-ST elevation (NSTEMI) myocardial infarction: Secondary | ICD-10-CM | POA: Diagnosis not present

## 2017-11-22 DIAGNOSIS — I110 Hypertensive heart disease with heart failure: Secondary | ICD-10-CM | POA: Diagnosis not present

## 2017-11-22 DIAGNOSIS — C3491 Malignant neoplasm of unspecified part of right bronchus or lung: Secondary | ICD-10-CM | POA: Diagnosis not present

## 2017-11-22 DIAGNOSIS — E119 Type 2 diabetes mellitus without complications: Secondary | ICD-10-CM | POA: Diagnosis not present

## 2017-11-22 DIAGNOSIS — J449 Chronic obstructive pulmonary disease, unspecified: Secondary | ICD-10-CM | POA: Diagnosis not present

## 2017-11-22 DIAGNOSIS — I509 Heart failure, unspecified: Secondary | ICD-10-CM | POA: Diagnosis not present

## 2017-11-23 ENCOUNTER — Telehealth: Payer: Self-pay | Admitting: Internal Medicine

## 2017-11-23 DIAGNOSIS — J449 Chronic obstructive pulmonary disease, unspecified: Secondary | ICD-10-CM | POA: Diagnosis not present

## 2017-11-23 DIAGNOSIS — C3491 Malignant neoplasm of unspecified part of right bronchus or lung: Secondary | ICD-10-CM | POA: Diagnosis not present

## 2017-11-23 DIAGNOSIS — I214 Non-ST elevation (NSTEMI) myocardial infarction: Secondary | ICD-10-CM | POA: Diagnosis not present

## 2017-11-23 DIAGNOSIS — E119 Type 2 diabetes mellitus without complications: Secondary | ICD-10-CM | POA: Diagnosis not present

## 2017-11-23 DIAGNOSIS — I509 Heart failure, unspecified: Secondary | ICD-10-CM | POA: Diagnosis not present

## 2017-11-23 DIAGNOSIS — I110 Hypertensive heart disease with heart failure: Secondary | ICD-10-CM | POA: Diagnosis not present

## 2017-11-23 NOTE — Telephone Encounter (Signed)
Scheduled appt per 12/19 sch message - patient is aware of appt date and time.

## 2017-11-26 DIAGNOSIS — C3491 Malignant neoplasm of unspecified part of right bronchus or lung: Secondary | ICD-10-CM | POA: Diagnosis not present

## 2017-11-26 DIAGNOSIS — E119 Type 2 diabetes mellitus without complications: Secondary | ICD-10-CM | POA: Diagnosis not present

## 2017-11-26 DIAGNOSIS — I214 Non-ST elevation (NSTEMI) myocardial infarction: Secondary | ICD-10-CM | POA: Diagnosis not present

## 2017-11-26 DIAGNOSIS — I110 Hypertensive heart disease with heart failure: Secondary | ICD-10-CM | POA: Diagnosis not present

## 2017-11-26 DIAGNOSIS — J449 Chronic obstructive pulmonary disease, unspecified: Secondary | ICD-10-CM | POA: Diagnosis not present

## 2017-11-26 DIAGNOSIS — I509 Heart failure, unspecified: Secondary | ICD-10-CM | POA: Diagnosis not present

## 2017-11-28 DIAGNOSIS — C3491 Malignant neoplasm of unspecified part of right bronchus or lung: Secondary | ICD-10-CM | POA: Diagnosis not present

## 2017-11-28 DIAGNOSIS — E119 Type 2 diabetes mellitus without complications: Secondary | ICD-10-CM | POA: Diagnosis not present

## 2017-11-28 DIAGNOSIS — I509 Heart failure, unspecified: Secondary | ICD-10-CM | POA: Diagnosis not present

## 2017-11-28 DIAGNOSIS — I110 Hypertensive heart disease with heart failure: Secondary | ICD-10-CM | POA: Diagnosis not present

## 2017-11-28 DIAGNOSIS — I214 Non-ST elevation (NSTEMI) myocardial infarction: Secondary | ICD-10-CM | POA: Diagnosis not present

## 2017-11-28 DIAGNOSIS — J449 Chronic obstructive pulmonary disease, unspecified: Secondary | ICD-10-CM | POA: Diagnosis not present

## 2017-11-30 DIAGNOSIS — J449 Chronic obstructive pulmonary disease, unspecified: Secondary | ICD-10-CM | POA: Diagnosis not present

## 2017-11-30 DIAGNOSIS — E119 Type 2 diabetes mellitus without complications: Secondary | ICD-10-CM | POA: Diagnosis not present

## 2017-11-30 DIAGNOSIS — I214 Non-ST elevation (NSTEMI) myocardial infarction: Secondary | ICD-10-CM | POA: Diagnosis not present

## 2017-11-30 DIAGNOSIS — I110 Hypertensive heart disease with heart failure: Secondary | ICD-10-CM | POA: Diagnosis not present

## 2017-11-30 DIAGNOSIS — I509 Heart failure, unspecified: Secondary | ICD-10-CM | POA: Diagnosis not present

## 2017-11-30 DIAGNOSIS — C3491 Malignant neoplasm of unspecified part of right bronchus or lung: Secondary | ICD-10-CM | POA: Diagnosis not present

## 2017-12-03 ENCOUNTER — Other Ambulatory Visit: Payer: Self-pay | Admitting: *Deleted

## 2017-12-03 DIAGNOSIS — I509 Heart failure, unspecified: Secondary | ICD-10-CM | POA: Diagnosis not present

## 2017-12-03 DIAGNOSIS — E119 Type 2 diabetes mellitus without complications: Secondary | ICD-10-CM | POA: Diagnosis not present

## 2017-12-03 DIAGNOSIS — I110 Hypertensive heart disease with heart failure: Secondary | ICD-10-CM | POA: Diagnosis not present

## 2017-12-03 DIAGNOSIS — J449 Chronic obstructive pulmonary disease, unspecified: Secondary | ICD-10-CM | POA: Diagnosis not present

## 2017-12-03 DIAGNOSIS — I214 Non-ST elevation (NSTEMI) myocardial infarction: Secondary | ICD-10-CM | POA: Diagnosis not present

## 2017-12-03 DIAGNOSIS — C3491 Malignant neoplasm of unspecified part of right bronchus or lung: Secondary | ICD-10-CM | POA: Diagnosis not present

## 2017-12-03 NOTE — Patient Outreach (Signed)
[facility]) Care Management  12/03/2017  PRIA KLOSINSKI 1930-08-14 [id number]  Referral via EMMI-Heart Failure-Red Alert-Day#33, 12/01/2017; Reason_ New problem/ worsening/new swelling -yes:  Telephone call to patient who was advised of reason for call.  HIPPA verification received.  Patient voices that she is not having any new/worsening problems or new swelling with heart failure condition. States she is weighing daily and has actually lost weight. Voices she is taking medications as prescribed by her MD.   States she has MD numbers available and knows to call if problems with heart failure symptoms, such as weight gain & shortness of breath.  Voices no health concerns currently. EMMI alert has been addressed.  Plan: Send to care management assistant for case closure.   [name], RN BSN [place] Management Coordinator [facility] Care Management  [phone]

## 2017-12-05 DIAGNOSIS — I509 Heart failure, unspecified: Secondary | ICD-10-CM | POA: Diagnosis not present

## 2017-12-05 DIAGNOSIS — E119 Type 2 diabetes mellitus without complications: Secondary | ICD-10-CM | POA: Diagnosis not present

## 2017-12-05 DIAGNOSIS — J449 Chronic obstructive pulmonary disease, unspecified: Secondary | ICD-10-CM | POA: Diagnosis not present

## 2017-12-05 DIAGNOSIS — I214 Non-ST elevation (NSTEMI) myocardial infarction: Secondary | ICD-10-CM | POA: Diagnosis not present

## 2017-12-05 DIAGNOSIS — I110 Hypertensive heart disease with heart failure: Secondary | ICD-10-CM | POA: Diagnosis not present

## 2017-12-05 DIAGNOSIS — C3491 Malignant neoplasm of unspecified part of right bronchus or lung: Secondary | ICD-10-CM | POA: Diagnosis not present

## 2017-12-06 ENCOUNTER — Other Ambulatory Visit: Payer: Self-pay | Admitting: *Deleted

## 2017-12-06 DIAGNOSIS — C3491 Malignant neoplasm of unspecified part of right bronchus or lung: Secondary | ICD-10-CM | POA: Diagnosis not present

## 2017-12-06 DIAGNOSIS — J449 Chronic obstructive pulmonary disease, unspecified: Secondary | ICD-10-CM | POA: Diagnosis not present

## 2017-12-06 DIAGNOSIS — E119 Type 2 diabetes mellitus without complications: Secondary | ICD-10-CM | POA: Diagnosis not present

## 2017-12-06 DIAGNOSIS — I509 Heart failure, unspecified: Secondary | ICD-10-CM | POA: Diagnosis not present

## 2017-12-06 DIAGNOSIS — I214 Non-ST elevation (NSTEMI) myocardial infarction: Secondary | ICD-10-CM | POA: Diagnosis not present

## 2017-12-06 DIAGNOSIS — I110 Hypertensive heart disease with heart failure: Secondary | ICD-10-CM | POA: Diagnosis not present

## 2017-12-06 NOTE — Patient Outreach (Signed)
[facility]) Care Management  12/06/2017  Erin Carr [birth year] [id number]  Referral via [place] -Day#37, 12/05/2017;  Reason: Know how to take meds?-" no"  Call #1 to patient who was advised of reason for call. HIPPA verification received.  Patient states she knows how to take her medications & is following MD instructions on how to take. States she takes medications daily and has assistance from her sister as she needs it.    Voices she continues to weigh daily & knows when to report overage of weight to MD office.  States no health concerns at this time. EMMI call has been addressed.  Plan: Send to care management assistant for case closure.   Erin Daisy, RN BSN [place] Management Coordinator [facility] Care Management  [phone]

## 2017-12-07 DIAGNOSIS — C3491 Malignant neoplasm of unspecified part of right bronchus or lung: Secondary | ICD-10-CM | POA: Diagnosis not present

## 2017-12-07 DIAGNOSIS — I214 Non-ST elevation (NSTEMI) myocardial infarction: Secondary | ICD-10-CM | POA: Diagnosis not present

## 2017-12-07 DIAGNOSIS — I110 Hypertensive heart disease with heart failure: Secondary | ICD-10-CM | POA: Diagnosis not present

## 2017-12-07 DIAGNOSIS — I509 Heart failure, unspecified: Secondary | ICD-10-CM | POA: Diagnosis not present

## 2017-12-07 DIAGNOSIS — E119 Type 2 diabetes mellitus without complications: Secondary | ICD-10-CM | POA: Diagnosis not present

## 2017-12-07 DIAGNOSIS — J449 Chronic obstructive pulmonary disease, unspecified: Secondary | ICD-10-CM | POA: Diagnosis not present

## 2017-12-10 DIAGNOSIS — I214 Non-ST elevation (NSTEMI) myocardial infarction: Secondary | ICD-10-CM | POA: Diagnosis not present

## 2017-12-10 DIAGNOSIS — I509 Heart failure, unspecified: Secondary | ICD-10-CM | POA: Diagnosis not present

## 2017-12-10 DIAGNOSIS — C3491 Malignant neoplasm of unspecified part of right bronchus or lung: Secondary | ICD-10-CM | POA: Diagnosis not present

## 2017-12-10 DIAGNOSIS — I110 Hypertensive heart disease with heart failure: Secondary | ICD-10-CM | POA: Diagnosis not present

## 2017-12-10 DIAGNOSIS — E119 Type 2 diabetes mellitus without complications: Secondary | ICD-10-CM | POA: Diagnosis not present

## 2017-12-10 DIAGNOSIS — J449 Chronic obstructive pulmonary disease, unspecified: Secondary | ICD-10-CM | POA: Diagnosis not present

## 2017-12-11 ENCOUNTER — Inpatient Hospital Stay: Payer: Medicare Other

## 2017-12-11 ENCOUNTER — Inpatient Hospital Stay: Payer: Medicare Other | Attending: Oncology | Admitting: Oncology

## 2017-12-11 ENCOUNTER — Encounter: Payer: Self-pay | Admitting: Oncology

## 2017-12-11 ENCOUNTER — Telehealth: Payer: Self-pay | Admitting: Internal Medicine

## 2017-12-11 VITALS — BP 119/53 | HR 80 | Temp 98.3°F | Resp 18 | Ht 62.0 in | Wt 122.5 lb

## 2017-12-11 DIAGNOSIS — C3491 Malignant neoplasm of unspecified part of right bronchus or lung: Secondary | ICD-10-CM

## 2017-12-11 DIAGNOSIS — D381 Neoplasm of uncertain behavior of trachea, bronchus and lung: Secondary | ICD-10-CM

## 2017-12-11 DIAGNOSIS — I509 Heart failure, unspecified: Secondary | ICD-10-CM | POA: Diagnosis not present

## 2017-12-11 DIAGNOSIS — I639 Cerebral infarction, unspecified: Secondary | ICD-10-CM | POA: Diagnosis not present

## 2017-12-11 DIAGNOSIS — I110 Hypertensive heart disease with heart failure: Secondary | ICD-10-CM | POA: Diagnosis not present

## 2017-12-11 DIAGNOSIS — J449 Chronic obstructive pulmonary disease, unspecified: Secondary | ICD-10-CM | POA: Diagnosis not present

## 2017-12-11 DIAGNOSIS — I214 Non-ST elevation (NSTEMI) myocardial infarction: Secondary | ICD-10-CM | POA: Diagnosis not present

## 2017-12-11 DIAGNOSIS — C3411 Malignant neoplasm of upper lobe, right bronchus or lung: Secondary | ICD-10-CM

## 2017-12-11 DIAGNOSIS — R634 Abnormal weight loss: Secondary | ICD-10-CM

## 2017-12-11 DIAGNOSIS — E119 Type 2 diabetes mellitus without complications: Secondary | ICD-10-CM | POA: Diagnosis not present

## 2017-12-11 LAB — CBC WITH DIFFERENTIAL/PLATELET
Abs Granulocyte: 6.8 10*3/uL — ABNORMAL HIGH (ref 1.5–6.5)
Basophils Absolute: 0 10*3/uL (ref 0.0–0.1)
Basophils Relative: 0 %
EOS ABS: 11.1 10*3/uL — AB (ref 0.0–0.5)
EOS PCT: 55 %
HCT: 29.3 % — ABNORMAL LOW (ref 34.8–46.6)
HEMOGLOBIN: 8.8 g/dL — AB (ref 11.6–15.9)
LYMPHS ABS: 1.1 10*3/uL (ref 0.9–3.3)
Lymphocytes Relative: 6 %
MCH: 24 pg — AB (ref 25.1–34.0)
MCHC: 30 g/dL — AB (ref 31.5–36.0)
MCV: 79.8 fL (ref 79.5–101.0)
MONOS PCT: 5 %
Monocytes Absolute: 1 10*3/uL — ABNORMAL HIGH (ref 0.1–0.9)
Neutro Abs: 6.8 10*3/uL — ABNORMAL HIGH (ref 1.5–6.5)
Neutrophils Relative %: 34 %
PLATELETS: 327 10*3/uL (ref 145–400)
RBC: 3.67 MIL/uL — ABNORMAL LOW (ref 3.70–5.45)
RDW: 16.4 % — ABNORMAL HIGH (ref 11.2–16.1)
WBC: 20 10*3/uL — ABNORMAL HIGH (ref 3.9–10.3)

## 2017-12-11 LAB — COMPREHENSIVE METABOLIC PANEL
ALBUMIN: 2.7 g/dL — AB (ref 3.5–5.0)
ALK PHOS: 92 U/L (ref 40–150)
ALT: 12 U/L (ref 0–55)
AST: 15 U/L (ref 5–34)
Anion gap: 7 (ref 3–11)
BUN: 18 mg/dL (ref 7–26)
CALCIUM: 9.1 mg/dL (ref 8.4–10.4)
CO2: 28 mmol/L (ref 22–29)
CREATININE: 1.24 mg/dL — AB (ref 0.60–1.10)
Chloride: 102 mmol/L (ref 98–109)
GFR calc Af Amer: 45 mL/min — ABNORMAL LOW (ref >60)
GFR calc non Af Amer: 38 mL/min — ABNORMAL LOW (ref >60)
GLUCOSE: 107 mg/dL (ref 70–140)
Potassium: 4.2 mmol/L (ref 3.3–4.7)
SODIUM: 137 mmol/L (ref 136–145)
Total Bilirubin: 0.3 mg/dL (ref 0.2–1.2)
Total Protein: 6.9 g/dL (ref 6.4–8.3)

## 2017-12-11 NOTE — Assessment & Plan Note (Signed)
This is a very pleasant 84 year old stage IA non-small cell lung cancer presented with right upper lobe pulmonary nodule status post curative stereotactic body radiotherapy. The patient tolerated her previous treatment well.  Patient was seen with Dr. Julien Nordmann.  Restaging CT scan results were discussed with the patient and her sister.  Explained to the patient that there is improvement in the aeration throughout the right lung recommend that she continue observation.  She will have a restaging CT scan of the chest in approximately 3 months.  She will a follow-up visit 1-2 days after her scan to discuss the results.  She was advised to call immediately if she has any concerning symptoms in the interval.

## 2017-12-11 NOTE — Progress Notes (Signed)
[facility] OFFICE PROGRESS NOTE  Vincente Liberty, MD [place] [address]  DIAGNOSIS: Stage IA(T1b, N0, M0) non-small cell lung cancer, adenocarcinoma presented with right upper lobe lung nodule diagnosed in November 2017.  PRIOR THERAPY: Curative stereotactic body radiotherapy to the right upper lobe lung nodule under the care of Dr. Sondra Come completed on 12/27/2016.  CURRENT THERAPY: Observation  INTERVAL HISTORY: Erin Carr 84 y.o. female returns for routine follow-up visit accompanied by her sister.  The patient is feeling fine today has no specific complaints.  Since her last visit, she was admitted to the hospital with a stroke.  She has recovered but still has weakness in her right leg.  She is walking with a walker.  She had a PET scan at her last visit which showed some concern for recurrent lung cancer.  Follow-up imaging showed some improvement in this area and the area, the PET scan was thought to be due to radiation pneumonitis.  The patient denies fevers and chills.  Denies chest pain, shortness breath, cough, hemoptysis.  Denies nausea, vomiting, constipation, diarrhea.  The patient has had some weight loss due to being hospitalized for her stroke recently.  She reports that her appetite is good.  The patient had a recent restaging CT scan is here to discuss the results.  MEDICAL HISTORY: Past Medical History:  Diagnosis Date  . Adenocarcinoma of right lung, stage 1 ([place]) 11/16/2016  . Arthritis   . Asthma   . COPD (chronic obstructive pulmonary disease) ([place])   . Diabetes mellitus without complication ([place])    Type II  . Family history of adverse reaction to anesthesia    "aunt had PONV"  . History of radiation therapy 12/19/16-12/27/16   SBRT to right upper lobe 54 Gy in 3 fractions  . Hypercholesteremia   . Hypertension   . Osteoporosis   . Positive PPD 12/16   PER PCP  . Shortness of breath dyspnea   . Tremors of nervous  system    Left hand  . Urinary frequency   . Vitamin D deficiency   . Wears glasses     ALLERGIES:  has No Known Allergies.  MEDICATIONS:  Current Outpatient Medications  Medication Sig Dispense Refill  . aspirin EC 81 MG tablet Take 81 mg by mouth daily.    Marland Kitchen atorvastatin (LIPITOR) 40 MG tablet Take 1 tablet (40 mg total) by mouth daily at 6 PM. 30 tablet 2  . ergocalciferol (VITAMIN D2) 50000 units capsule Take 50,000 Units by mouth once a week.    . ferrous sulfate 325 (65 FE) MG tablet Take 1 tablet (325 mg total) by mouth daily. 30 tablet 0  . furosemide (LASIX) 20 MG tablet Take 3 tablets (60 mg total) 2 (two) times daily by mouth. 30 tablet 0  . gabapentin (NEURONTIN) 400 MG capsule Take 400 mg by mouth 2 (two) times daily.    Marland Kitchen glipiZIDE (GLUCOTROL) 5 MG tablet Take 5 mg by mouth daily before breakfast.    . lisinopril (PRINIVIL,ZESTRIL) 20 MG tablet Take 20 mg by mouth 2 (two) times daily.    . metFORMIN (GLUCOPHAGE-XR) 500 MG 24 hr tablet Take 500 mg by mouth 2 (two) times daily.    . metoprolol tartrate (LOPRESSOR) 25 MG tablet Take 0.5 tablets (12.5 mg total) by mouth 2 (two) times daily. 60 tablet 2  . potassium chloride (K-DUR) 10 MEQ tablet Take 10 mEq by mouth daily.    Marland Kitchen  TRAVATAN Z 0.004 % SOLN ophthalmic solution Place 1 drop into both eyes at bedtime.    Marland Kitchen albuterol (PROVENTIL HFA;VENTOLIN HFA) 108 (90 Base) MCG/ACT inhaler Inhale 1 puff into the lungs every 4 (four) hours as needed for wheezing or shortness of breath.     No current facility-administered medications for this visit.     SURGICAL HISTORY:  Past Surgical History:  Procedure Laterality Date  . ABDOMINAL HYSTERECTOMY    . ANTERIOR AND POSTERIOR REPAIR N/A 12/16/2014   Procedure: ANTERIOR (CYSTOCELE) AND POSTERIOR REPAIR (RECTOCELE);  Surgeon: Crawford Givens, MD;  Location: [facility];  Service: Gynecology;  Laterality: N/A;  . APPENDECTOMY    . BOWEL RESECTION    . BREAST BIOPSY Left   . COLONOSCOPY     . VIDEO BRONCHOSCOPY WITH ENDOBRONCHIAL ULTRASOUND N/A 12/17/2015   Procedure: VIDEO BRONCHOSCOPY WITH ENDOBRONCHIAL ULTRASOUND;  Surgeon: Ivin Poot, MD;  Location: [facility] OR;  Service: Thoracic;  Laterality: N/A;    REVIEW OF SYSTEMS:   Review of Systems  Constitutional: Negative for appetite change, chills, fatigue, fever.  Positive for weight loss. HENT:   Negative for mouth sores, nosebleeds, sore throat and trouble swallowing.   Eyes: Negative for eye problems and icterus.  Respiratory: Negative for cough, hemoptysis, shortness of breath and wheezing.   Cardiovascular: Negative for chest pain and leg swelling.  Gastrointestinal: Negative for abdominal pain, constipation, diarrhea, nausea and vomiting.  Genitourinary: Negative for bladder incontinence, difficulty urinating, dysuria, frequency and hematuria.   Musculoskeletal: Negative for back pain, neck pain and neck stiffness. Positive for right foot drop. Skin: Negative for itching and rash.  Neurological: Negative for dizziness, extremity weakness, gait problem, headaches, light-headedness and seizures.  Hematological: Negative for adenopathy. Does not bruise/bleed easily.  Psychiatric/Behavioral: Negative for confusion, depression and sleep disturbance. The patient is not nervous/anxious.     PHYSICAL EXAMINATION:  Blood pressure (!) 119/53, pulse 80, temperature 98.3 F (36.8 C), temperature source Oral, resp. rate 18, height 5\' 2"  (1.575 m), weight 122 lb 8 oz (55.6 kg), SpO2 100 %.  ECOG PERFORMANCE STATUS: 1 - Symptomatic but completely ambulatory  Physical Exam  Constitutional: Oriented to person, place, and time and well-developed, well-nourished, and in no distress. No distress.  HENT:  Head: Normocephalic and atraumatic.  Mouth/Throat: Oropharynx is clear and moist. No oropharyngeal exudate.  Eyes: Conjunctivae are normal. Right eye exhibits no discharge. Left eye exhibits no discharge. No scleral icterus.  Neck:  Normal range of motion. Neck supple.  Cardiovascular: Normal rate, regular rhythm, normal heart sounds and intact distal pulses.   Pulmonary/Chest: Effort normal and breath sounds normal. No respiratory distress. No wheezes. No rales.  Abdominal: Soft. Bowel sounds are normal. Exhibits no distension and no mass. There is no tenderness.  Musculoskeletal: Normal range of motion. Exhibits no edema.  Lymphadenopathy:    No cervical adenopathy.  Neurological: Alert and oriented to person, place, and time. Exhibits normal muscle tone. Coordination normal.  Skin: Skin is warm and dry. No rash noted. Not diaphoretic. No erythema. No pallor.  Psychiatric: Mood, memory and judgment normal.  Vitals reviewed.  LABORATORY DATA: Lab Results  Component Value Date   WBC 20.0 (H) 12/11/2017   HGB 8.8 (L) 12/11/2017   HCT 29.3 (L) 12/11/2017   MCV 79.8 12/11/2017   PLT 327 12/11/2017      Chemistry      Component Value Date/Time   NA 137 12/11/2017 1225   NA 133 (L) 08/01/2017 1005   K  4.2 12/11/2017 1225   K 4.1 08/01/2017 1005   CL 102 12/11/2017 1225   CO2 28 12/11/2017 1225   CO2 27 08/01/2017 1005   BUN 18 12/11/2017 1225   BUN 10.4 08/01/2017 1005   CREATININE 1.24 (H) 12/11/2017 1225   CREATININE 0.7 08/01/2017 1005      Component Value Date/Time   CALCIUM 9.1 12/11/2017 1225   CALCIUM 9.1 08/01/2017 1005   ALKPHOS 92 12/11/2017 1225   ALKPHOS 77 08/01/2017 1005   AST 15 12/11/2017 1225   AST 51 (H) 08/01/2017 1005   ALT 12 12/11/2017 1225   ALT 63 (H) 08/01/2017 1005   BILITOT 0.3 12/11/2017 1225   BILITOT 0.40 08/01/2017 1005       RADIOGRAPHIC STUDIES:  No results found.   ASSESSMENT/PLAN:  Adenocarcinoma of right lung, stage 1 (HCC) This is a very pleasant 84 year old stage IA non-small cell lung cancer presented with right upper lobe pulmonary nodule status post curative stereotactic body radiotherapy. The patient tolerated her previous treatment  well.  Patient was seen with Dr. Julien Nordmann.  Restaging CT scan results were discussed with the patient and her sister.  Explained to the patient that there is improvement in the aeration throughout the right lung recommend that she continue observation.  She will have a restaging CT scan of the chest in approximately 3 months.  She will a follow-up visit 1-2 days after her scan to discuss the results.  She was advised to call immediately if she has any concerning symptoms in the interval.  Orders Placed This Encounter  Procedures  . CT CHEST W CONTRAST    Standing Status:   Future    Standing Expiration Date:   12/11/2018    Order Specific Question:   If indicated for the ordered procedure, I authorize the administration of contrast media per Radiology protocol    Answer:   Yes    Order Specific Question:   Preferred imaging location?    Answer:   [facility]    Order Specific Question:   Radiology Contrast Protocol - do NOT remove file path    Answer:   [email or web address]    Order Specific Question:   Reason for Exam additional comments    Answer:   lung cancer. Restaging.  Marland Kitchen CBC with Differential (Cancer Center Only)    Standing Status:   Future    Standing Expiration Date:   12/11/2018  . Comprehensive metabolic panel    Standing Status:   Future    Standing Expiration Date:   12/11/2018     Mikey Bussing, DNP, AGPCNP-BC, AOCNP 12/11/17  ADDENDUM: Hematology/Oncology Attending: I had a face-to-face encounter with the patient.  I recommended her care plan.  This is a very pleasant 84 years old African-American female with a stage Ia non-small cell lung cancer status post stereotactic body radiotherapy to the right upper lobe pulmonary nodule. The patient is currently on observation and she is feeling fine.  Repeat CT scan of the chest showed no concerning findings for disease progression. I discussed the scan results with the patient and her  sister and recommended for her to continue in observation with repeat CT scan of the chest in 6 months. She was advised to call immediately if she has any concerning symptoms in the interval.  Disclaimer: This note was dictated with voice recognition software. Similar sounding words can inadvertently be transcribed and may be missed upon review. Eilleen Kempf, MD 12/12/17

## 2017-12-11 NOTE — Telephone Encounter (Signed)
Gave avs and calendar april

## 2017-12-12 ENCOUNTER — Other Ambulatory Visit: Payer: Self-pay | Admitting: *Deleted

## 2017-12-12 NOTE — Patient Outreach (Signed)
[facility]) Care Management  12/12/2017  Erin Carr 04-07-1930 [id number]  Referral via [place] Day#43 12/11/2017;  Reason: Know why to take meds?- "no"  Telephone call to patient who was advised of reason for call. Patient voices that she does why she is taking her medications.  Voices that she is doing well and does not want to take any more calls & wants automated calls stopped.  Pla: Send to care management assistant to close case. Please stop EMMI - Heart Failure calls at patient request.  [name], RN BSN [place] Management Coordinator [facility] Care Management  [phone]

## 2017-12-14 DIAGNOSIS — C3491 Malignant neoplasm of unspecified part of right bronchus or lung: Secondary | ICD-10-CM | POA: Diagnosis not present

## 2017-12-14 DIAGNOSIS — J449 Chronic obstructive pulmonary disease, unspecified: Secondary | ICD-10-CM | POA: Diagnosis not present

## 2017-12-14 DIAGNOSIS — I509 Heart failure, unspecified: Secondary | ICD-10-CM | POA: Diagnosis not present

## 2017-12-14 DIAGNOSIS — I214 Non-ST elevation (NSTEMI) myocardial infarction: Secondary | ICD-10-CM | POA: Diagnosis not present

## 2017-12-14 DIAGNOSIS — E119 Type 2 diabetes mellitus without complications: Secondary | ICD-10-CM | POA: Diagnosis not present

## 2017-12-14 DIAGNOSIS — I110 Hypertensive heart disease with heart failure: Secondary | ICD-10-CM | POA: Diagnosis not present

## 2017-12-18 DIAGNOSIS — I110 Hypertensive heart disease with heart failure: Secondary | ICD-10-CM | POA: Diagnosis not present

## 2017-12-18 DIAGNOSIS — J449 Chronic obstructive pulmonary disease, unspecified: Secondary | ICD-10-CM | POA: Diagnosis not present

## 2017-12-18 DIAGNOSIS — C3491 Malignant neoplasm of unspecified part of right bronchus or lung: Secondary | ICD-10-CM | POA: Diagnosis not present

## 2017-12-18 DIAGNOSIS — I214 Non-ST elevation (NSTEMI) myocardial infarction: Secondary | ICD-10-CM | POA: Diagnosis not present

## 2017-12-18 DIAGNOSIS — E119 Type 2 diabetes mellitus without complications: Secondary | ICD-10-CM | POA: Diagnosis not present

## 2017-12-18 DIAGNOSIS — I509 Heart failure, unspecified: Secondary | ICD-10-CM | POA: Diagnosis not present

## 2017-12-19 ENCOUNTER — Ambulatory Visit (INDEPENDENT_AMBULATORY_CARE_PROVIDER_SITE_OTHER): Payer: Medicare Other | Admitting: Orthotics

## 2017-12-19 DIAGNOSIS — M79671 Pain in right foot: Secondary | ICD-10-CM

## 2017-12-19 DIAGNOSIS — M21371 Foot drop, right foot: Secondary | ICD-10-CM

## 2017-12-19 NOTE — Progress Notes (Signed)
Patient  came in to pick up [employer]ar [employer] w/ dorsi assist springs.  The brace fit well and immediately patient's  gait approved.  The brace provided desired m-l stability in frontal/transverse planes and aided in dorsiflexion in saggital plane. Patient was able to don and doff brace with minimal difficulty.  Overall patient pleased with fit and functionality of brace.

## 2017-12-21 DIAGNOSIS — I214 Non-ST elevation (NSTEMI) myocardial infarction: Secondary | ICD-10-CM | POA: Diagnosis not present

## 2017-12-21 DIAGNOSIS — J449 Chronic obstructive pulmonary disease, unspecified: Secondary | ICD-10-CM | POA: Diagnosis not present

## 2017-12-21 DIAGNOSIS — C3491 Malignant neoplasm of unspecified part of right bronchus or lung: Secondary | ICD-10-CM | POA: Diagnosis not present

## 2017-12-21 DIAGNOSIS — I509 Heart failure, unspecified: Secondary | ICD-10-CM | POA: Diagnosis not present

## 2017-12-21 DIAGNOSIS — E119 Type 2 diabetes mellitus without complications: Secondary | ICD-10-CM | POA: Diagnosis not present

## 2017-12-21 DIAGNOSIS — I110 Hypertensive heart disease with heart failure: Secondary | ICD-10-CM | POA: Diagnosis not present

## 2017-12-25 ENCOUNTER — Ambulatory Visit: Payer: Medicare Other | Admitting: Diagnostic Neuroimaging

## 2017-12-25 DIAGNOSIS — I214 Non-ST elevation (NSTEMI) myocardial infarction: Secondary | ICD-10-CM | POA: Diagnosis not present

## 2017-12-25 DIAGNOSIS — C3491 Malignant neoplasm of unspecified part of right bronchus or lung: Secondary | ICD-10-CM | POA: Diagnosis not present

## 2017-12-25 DIAGNOSIS — E119 Type 2 diabetes mellitus without complications: Secondary | ICD-10-CM | POA: Diagnosis not present

## 2017-12-25 DIAGNOSIS — I110 Hypertensive heart disease with heart failure: Secondary | ICD-10-CM | POA: Diagnosis not present

## 2017-12-25 DIAGNOSIS — I509 Heart failure, unspecified: Secondary | ICD-10-CM | POA: Diagnosis not present

## 2017-12-25 DIAGNOSIS — J449 Chronic obstructive pulmonary disease, unspecified: Secondary | ICD-10-CM | POA: Diagnosis not present

## 2017-12-26 ENCOUNTER — Encounter: Payer: Self-pay | Admitting: Diagnostic Neuroimaging

## 2017-12-27 DIAGNOSIS — J449 Chronic obstructive pulmonary disease, unspecified: Secondary | ICD-10-CM | POA: Diagnosis not present

## 2017-12-27 DIAGNOSIS — I214 Non-ST elevation (NSTEMI) myocardial infarction: Secondary | ICD-10-CM | POA: Diagnosis not present

## 2017-12-27 DIAGNOSIS — I509 Heart failure, unspecified: Secondary | ICD-10-CM | POA: Diagnosis not present

## 2017-12-27 DIAGNOSIS — E119 Type 2 diabetes mellitus without complications: Secondary | ICD-10-CM | POA: Diagnosis not present

## 2017-12-27 DIAGNOSIS — I110 Hypertensive heart disease with heart failure: Secondary | ICD-10-CM | POA: Diagnosis not present

## 2017-12-27 DIAGNOSIS — C3491 Malignant neoplasm of unspecified part of right bronchus or lung: Secondary | ICD-10-CM | POA: Diagnosis not present

## 2018-01-02 DIAGNOSIS — I509 Heart failure, unspecified: Secondary | ICD-10-CM | POA: Diagnosis not present

## 2018-01-02 DIAGNOSIS — D649 Anemia, unspecified: Secondary | ICD-10-CM | POA: Diagnosis not present

## 2018-01-02 DIAGNOSIS — F17290 Nicotine dependence, other tobacco product, uncomplicated: Secondary | ICD-10-CM | POA: Diagnosis not present

## 2018-01-02 DIAGNOSIS — I119 Hypertensive heart disease without heart failure: Secondary | ICD-10-CM | POA: Diagnosis not present

## 2018-01-02 DIAGNOSIS — E785 Hyperlipidemia, unspecified: Secondary | ICD-10-CM | POA: Diagnosis not present

## 2018-01-02 DIAGNOSIS — I252 Old myocardial infarction: Secondary | ICD-10-CM | POA: Diagnosis not present

## 2018-01-02 DIAGNOSIS — J449 Chronic obstructive pulmonary disease, unspecified: Secondary | ICD-10-CM | POA: Diagnosis not present

## 2018-01-02 DIAGNOSIS — E119 Type 2 diabetes mellitus without complications: Secondary | ICD-10-CM | POA: Diagnosis not present

## 2018-01-02 DIAGNOSIS — M1990 Unspecified osteoarthritis, unspecified site: Secondary | ICD-10-CM | POA: Diagnosis not present

## 2018-01-02 DIAGNOSIS — I25118 Atherosclerotic heart disease of native coronary artery with other forms of angina pectoris: Secondary | ICD-10-CM | POA: Diagnosis not present

## 2018-01-02 DIAGNOSIS — E559 Vitamin D deficiency, unspecified: Secondary | ICD-10-CM | POA: Diagnosis not present

## 2018-01-14 DIAGNOSIS — E7800 Pure hypercholesterolemia, unspecified: Secondary | ICD-10-CM | POA: Diagnosis not present

## 2018-01-14 DIAGNOSIS — J449 Chronic obstructive pulmonary disease, unspecified: Secondary | ICD-10-CM | POA: Diagnosis not present

## 2018-01-14 DIAGNOSIS — D638 Anemia in other chronic diseases classified elsewhere: Secondary | ICD-10-CM | POA: Diagnosis not present

## 2018-01-14 DIAGNOSIS — R0602 Shortness of breath: Secondary | ICD-10-CM | POA: Diagnosis not present

## 2018-01-14 DIAGNOSIS — I6990 Unspecified sequelae of unspecified cerebrovascular disease: Secondary | ICD-10-CM | POA: Diagnosis not present

## 2018-01-14 DIAGNOSIS — Z79899 Other long term (current) drug therapy: Secondary | ICD-10-CM | POA: Diagnosis not present

## 2018-01-14 DIAGNOSIS — E1140 Type 2 diabetes mellitus with diabetic neuropathy, unspecified: Secondary | ICD-10-CM | POA: Diagnosis not present

## 2018-01-14 DIAGNOSIS — I119 Hypertensive heart disease without heart failure: Secondary | ICD-10-CM | POA: Diagnosis not present

## 2018-01-14 DIAGNOSIS — E1165 Type 2 diabetes mellitus with hyperglycemia: Secondary | ICD-10-CM | POA: Diagnosis not present

## 2018-01-14 DIAGNOSIS — J4520 Mild intermittent asthma, uncomplicated: Secondary | ICD-10-CM | POA: Diagnosis not present

## 2018-01-14 DIAGNOSIS — G250 Essential tremor: Secondary | ICD-10-CM | POA: Diagnosis not present

## 2018-01-14 DIAGNOSIS — C3491 Malignant neoplasm of unspecified part of right bronchus or lung: Secondary | ICD-10-CM | POA: Diagnosis not present

## 2018-01-14 DIAGNOSIS — E559 Vitamin D deficiency, unspecified: Secondary | ICD-10-CM | POA: Diagnosis not present

## 2018-02-27 DIAGNOSIS — I25118 Atherosclerotic heart disease of native coronary artery with other forms of angina pectoris: Secondary | ICD-10-CM | POA: Diagnosis not present

## 2018-02-27 DIAGNOSIS — D649 Anemia, unspecified: Secondary | ICD-10-CM | POA: Diagnosis not present

## 2018-02-27 DIAGNOSIS — I10 Essential (primary) hypertension: Secondary | ICD-10-CM | POA: Diagnosis not present

## 2018-02-27 DIAGNOSIS — F17290 Nicotine dependence, other tobacco product, uncomplicated: Secondary | ICD-10-CM | POA: Diagnosis not present

## 2018-02-27 DIAGNOSIS — E559 Vitamin D deficiency, unspecified: Secondary | ICD-10-CM | POA: Diagnosis not present

## 2018-02-27 DIAGNOSIS — I252 Old myocardial infarction: Secondary | ICD-10-CM | POA: Diagnosis not present

## 2018-02-27 DIAGNOSIS — I509 Heart failure, unspecified: Secondary | ICD-10-CM | POA: Diagnosis not present

## 2018-02-27 DIAGNOSIS — E119 Type 2 diabetes mellitus without complications: Secondary | ICD-10-CM | POA: Diagnosis not present

## 2018-02-27 DIAGNOSIS — E785 Hyperlipidemia, unspecified: Secondary | ICD-10-CM | POA: Diagnosis not present

## 2018-02-27 DIAGNOSIS — J449 Chronic obstructive pulmonary disease, unspecified: Secondary | ICD-10-CM | POA: Diagnosis not present

## 2018-03-11 ENCOUNTER — Ambulatory Visit (HOSPITAL_COMMUNITY)
Admission: RE | Admit: 2018-03-11 | Discharge: 2018-03-11 | Disposition: A | Discharge status: Home / Self Care | Payer: Medicare Other | Source: Ambulatory Visit | Attending: Oncology | Admitting: Oncology

## 2018-03-11 ENCOUNTER — Inpatient Hospital Stay: Payer: Medicare Other | Attending: Oncology

## 2018-03-11 ENCOUNTER — Encounter (HOSPITAL_COMMUNITY): Payer: Self-pay

## 2018-03-11 DIAGNOSIS — C3491 Malignant neoplasm of unspecified part of right bronchus or lung: Secondary | ICD-10-CM

## 2018-03-11 DIAGNOSIS — C3411 Malignant neoplasm of upper lobe, right bronchus or lung: Secondary | ICD-10-CM | POA: Diagnosis not present

## 2018-03-11 DIAGNOSIS — Z923 Personal history of irradiation: Secondary | ICD-10-CM | POA: Diagnosis not present

## 2018-03-11 DIAGNOSIS — C3490 Malignant neoplasm of unspecified part of unspecified bronchus or lung: Secondary | ICD-10-CM | POA: Diagnosis not present

## 2018-03-11 DIAGNOSIS — R918 Other nonspecific abnormal finding of lung field: Secondary | ICD-10-CM | POA: Diagnosis not present

## 2018-03-11 LAB — CBC WITH DIFFERENTIAL (CANCER CENTER ONLY)
BASOS ABS: 0 10*3/uL (ref 0.0–0.1)
BASOS PCT: 1 %
EOS ABS: 0.8 10*3/uL — AB (ref 0.0–0.5)
Eosinophils Relative: 12 %
HCT: 33.9 % — ABNORMAL LOW (ref 34.8–46.6)
HEMOGLOBIN: 10.2 g/dL — AB (ref 11.6–15.9)
Lymphocytes Relative: 17 %
Lymphs Abs: 1.1 10*3/uL (ref 0.9–3.3)
MCH: 25.1 pg (ref 25.1–34.0)
MCHC: 30.1 g/dL — ABNORMAL LOW (ref 31.5–36.0)
MCV: 83.5 fL (ref 79.5–101.0)
Monocytes Absolute: 0.5 10*3/uL (ref 0.1–0.9)
Monocytes Relative: 8 %
NEUTROS PCT: 62 %
Neutro Abs: 4.2 10*3/uL (ref 1.5–6.5)
Platelet Count: 189 10*3/uL (ref 145–400)
RBC: 4.06 MIL/uL (ref 3.70–5.45)
RDW: 15.9 % — ABNORMAL HIGH (ref 11.2–14.5)
WBC: 6.6 10*3/uL (ref 3.9–10.3)

## 2018-03-11 LAB — COMPREHENSIVE METABOLIC PANEL
ALK PHOS: 85 U/L (ref 40–150)
ALT: 9 U/L (ref 0–55)
ANION GAP: 9 (ref 3–11)
AST: 14 U/L (ref 5–34)
Albumin: 3.8 g/dL (ref 3.5–5.0)
BUN: 23 mg/dL (ref 7–26)
CALCIUM: 9.8 mg/dL (ref 8.4–10.4)
CO2: 28 mmol/L (ref 22–29)
CREATININE: 1.43 mg/dL — AB (ref 0.60–1.10)
Chloride: 105 mmol/L (ref 98–109)
GFR, EST AFRICAN AMERICAN: 38 mL/min — AB (ref >60)
GFR, EST NON AFRICAN AMERICAN: 32 mL/min — AB (ref >60)
Glucose, Bld: 136 mg/dL (ref 70–140)
Potassium: 3.7 mmol/L (ref 3.5–5.1)
Sodium: 142 mmol/L (ref 136–145)
Total Bilirubin: 0.4 mg/dL (ref 0.2–1.2)
Total Protein: 7 g/dL (ref 6.4–8.3)

## 2018-03-11 MED ORDER — IOHEXOL 300 MG/ML  SOLN
75.0000 mL | Freq: Once | INTRAMUSCULAR | Status: AC | PRN
  Administered 2018-03-11: 60 mL via INTRAVENOUS

## 2018-03-13 ENCOUNTER — Ambulatory Visit: Payer: 59 | Admitting: Internal Medicine

## 2018-04-02 DIAGNOSIS — J4520 Mild intermittent asthma, uncomplicated: Secondary | ICD-10-CM | POA: Diagnosis not present

## 2018-04-02 DIAGNOSIS — E7800 Pure hypercholesterolemia, unspecified: Secondary | ICD-10-CM | POA: Diagnosis not present

## 2018-04-02 DIAGNOSIS — Z79899 Other long term (current) drug therapy: Secondary | ICD-10-CM | POA: Diagnosis not present

## 2018-04-02 DIAGNOSIS — J441 Chronic obstructive pulmonary disease with (acute) exacerbation: Secondary | ICD-10-CM | POA: Diagnosis not present

## 2018-04-02 DIAGNOSIS — C3490 Malignant neoplasm of unspecified part of unspecified bronchus or lung: Secondary | ICD-10-CM | POA: Diagnosis not present

## 2018-04-02 DIAGNOSIS — G5610 Other lesions of median nerve, unspecified upper limb: Secondary | ICD-10-CM | POA: Diagnosis not present

## 2018-04-02 DIAGNOSIS — L600 Ingrowing nail: Secondary | ICD-10-CM | POA: Diagnosis not present

## 2018-04-02 DIAGNOSIS — E559 Vitamin D deficiency, unspecified: Secondary | ICD-10-CM | POA: Diagnosis not present

## 2018-04-02 DIAGNOSIS — I6990 Unspecified sequelae of unspecified cerebrovascular disease: Secondary | ICD-10-CM | POA: Diagnosis not present

## 2018-04-02 DIAGNOSIS — D638 Anemia in other chronic diseases classified elsewhere: Secondary | ICD-10-CM | POA: Diagnosis not present

## 2018-04-02 DIAGNOSIS — E113299 Type 2 diabetes mellitus with mild nonproliferative diabetic retinopathy without macular edema, unspecified eye: Secondary | ICD-10-CM | POA: Diagnosis not present

## 2018-04-02 DIAGNOSIS — E1165 Type 2 diabetes mellitus with hyperglycemia: Secondary | ICD-10-CM | POA: Diagnosis not present

## 2018-04-03 ENCOUNTER — Encounter: Payer: Self-pay | Admitting: Podiatry

## 2018-04-03 ENCOUNTER — Ambulatory Visit (INDEPENDENT_AMBULATORY_CARE_PROVIDER_SITE_OTHER): Payer: Medicare Other | Admitting: Podiatry

## 2018-04-03 ENCOUNTER — Encounter: Payer: Self-pay | Admitting: *Deleted

## 2018-04-03 DIAGNOSIS — E119 Type 2 diabetes mellitus without complications: Secondary | ICD-10-CM | POA: Insufficient documentation

## 2018-04-03 DIAGNOSIS — B373 Candidiasis of vulva and vagina: Secondary | ICD-10-CM | POA: Insufficient documentation

## 2018-04-03 DIAGNOSIS — N950 Postmenopausal bleeding: Secondary | ICD-10-CM | POA: Insufficient documentation

## 2018-04-03 DIAGNOSIS — M79674 Pain in right toe(s): Secondary | ICD-10-CM

## 2018-04-03 DIAGNOSIS — M79675 Pain in left toe(s): Secondary | ICD-10-CM | POA: Diagnosis not present

## 2018-04-03 DIAGNOSIS — E663 Overweight: Secondary | ICD-10-CM | POA: Insufficient documentation

## 2018-04-03 DIAGNOSIS — I10 Essential (primary) hypertension: Secondary | ICD-10-CM | POA: Insufficient documentation

## 2018-04-03 DIAGNOSIS — B3731 Acute candidiasis of vulva and vagina: Secondary | ICD-10-CM | POA: Insufficient documentation

## 2018-04-03 DIAGNOSIS — B351 Tinea unguium: Secondary | ICD-10-CM | POA: Diagnosis not present

## 2018-04-03 DIAGNOSIS — E785 Hyperlipidemia, unspecified: Secondary | ICD-10-CM | POA: Insufficient documentation

## 2018-04-03 DIAGNOSIS — R32 Unspecified urinary incontinence: Secondary | ICD-10-CM | POA: Insufficient documentation

## 2018-04-03 DIAGNOSIS — Z87448 Personal history of other diseases of urinary system: Secondary | ICD-10-CM | POA: Insufficient documentation

## 2018-04-03 NOTE — Progress Notes (Signed)
Subjective:   Patient ID: Erin Carr, female   DOB: 85 y.o.   MRN: [id number]   HPI Patient presents with thick brittle nailbeds 1-5 both feet that are painful   ROS      Objective:  Physical Exam  Thick yellow brittle nailbeds 1-5 both feet that are painful in the corners     Assessment:  Mycotic nail infection with pain 1-5 both feet     Plan:  Debrided nailbeds 1-5 both feet with no iatrogenic bleeding noted

## 2018-04-17 DIAGNOSIS — I252 Old myocardial infarction: Secondary | ICD-10-CM | POA: Diagnosis not present

## 2018-04-17 DIAGNOSIS — I10 Essential (primary) hypertension: Secondary | ICD-10-CM | POA: Diagnosis not present

## 2018-04-17 DIAGNOSIS — F17290 Nicotine dependence, other tobacco product, uncomplicated: Secondary | ICD-10-CM | POA: Diagnosis not present

## 2018-04-17 DIAGNOSIS — D649 Anemia, unspecified: Secondary | ICD-10-CM | POA: Diagnosis not present

## 2018-04-17 DIAGNOSIS — E785 Hyperlipidemia, unspecified: Secondary | ICD-10-CM | POA: Diagnosis not present

## 2018-04-17 DIAGNOSIS — I509 Heart failure, unspecified: Secondary | ICD-10-CM | POA: Diagnosis not present

## 2018-04-17 DIAGNOSIS — I25118 Atherosclerotic heart disease of native coronary artery with other forms of angina pectoris: Secondary | ICD-10-CM | POA: Diagnosis not present

## 2018-04-17 DIAGNOSIS — E119 Type 2 diabetes mellitus without complications: Secondary | ICD-10-CM | POA: Diagnosis not present

## 2018-04-17 DIAGNOSIS — E559 Vitamin D deficiency, unspecified: Secondary | ICD-10-CM | POA: Diagnosis not present

## 2018-05-06 ENCOUNTER — Telehealth: Payer: Self-pay | Admitting: Internal Medicine

## 2018-05-06 ENCOUNTER — Other Ambulatory Visit: Payer: Self-pay | Admitting: Internal Medicine

## 2018-05-06 ENCOUNTER — Encounter: Payer: Self-pay | Admitting: Internal Medicine

## 2018-05-06 ENCOUNTER — Inpatient Hospital Stay: Payer: Medicare Other | Attending: Oncology | Admitting: Internal Medicine

## 2018-05-06 VITALS — BP 156/67 | HR 63 | Temp 98.1°F | Resp 18 | Ht 62.0 in | Wt 118.8 lb

## 2018-05-06 DIAGNOSIS — M1990 Unspecified osteoarthritis, unspecified site: Secondary | ICD-10-CM | POA: Diagnosis not present

## 2018-05-06 DIAGNOSIS — C3490 Malignant neoplasm of unspecified part of unspecified bronchus or lung: Secondary | ICD-10-CM

## 2018-05-06 DIAGNOSIS — C3411 Malignant neoplasm of upper lobe, right bronchus or lung: Secondary | ICD-10-CM | POA: Diagnosis not present

## 2018-05-06 DIAGNOSIS — C3491 Malignant neoplasm of unspecified part of right bronchus or lung: Secondary | ICD-10-CM

## 2018-05-06 DIAGNOSIS — R5383 Other fatigue: Secondary | ICD-10-CM | POA: Diagnosis not present

## 2018-05-06 DIAGNOSIS — I10 Essential (primary) hypertension: Secondary | ICD-10-CM | POA: Insufficient documentation

## 2018-05-06 NOTE — Telephone Encounter (Signed)
Scheduled appt per 6//3 los - Sent reminder letter in the mail with appt date and time.

## 2018-05-06 NOTE — Progress Notes (Signed)
[facility] [phone]   Fax:([phone]  OFFICE PROGRESS NOTE  Vincente Liberty, MD [place] [address]  DIAGNOSIS: Stage IA (T1b, N0, M0) non-small cell lung cancer, adenocarcinoma presented with right upper lobe lung nodule diagnosed in November 2017.  PRIOR THERAPY: Curative stereotactic body radiotherapy to the right upper lobe lung nodule under the care of Dr. Sondra Come completed on 12/27/2016.  CURRENT THERAPY: None.  INTERVAL HISTORY: Erin Carr 84 y.o. female returns to the clinic today for 3 months follow-up visit.  The patient is feeling fine today with no specific complaints except for arthritis and fatigue.  She denied having any chest pain, shortness of breath, cough or hemoptysis.  She denied having any fever or chills.  She has no nausea, vomiting, diarrhea or constipation.  She was supposed to have repeat CT scan of the chest before this visit but unfortunately the patient missed her appointment.  MEDICAL HISTORY: Past Medical History:  Diagnosis Date  . Adenocarcinoma of right lung, stage 1 ([place]) 11/16/2016  . Arthritis   . Asthma   . COPD (chronic obstructive pulmonary disease) ([place])   . Diabetes mellitus without complication ([place])    Type II  . Family history of adverse reaction to anesthesia    "aunt had PONV"  . History of radiation therapy 12/19/16-12/27/16   SBRT to right upper lobe 54 Gy in 3 fractions  . Hypercholesteremia   . Hypertension   . Osteoporosis   . Positive PPD 12/16   PER PCP  . Shortness of breath dyspnea   . Tremors of nervous system    Left hand  . Urinary frequency   . Vitamin D deficiency   . Wears glasses     ALLERGIES:  has No Known Allergies.  MEDICATIONS:  Current Outpatient Medications  Medication Sig Dispense Refill  . albuterol (PROVENTIL HFA;VENTOLIN HFA) 108 (90 Base) MCG/ACT inhaler Inhale 1 puff into the lungs every 4 (four) hours as needed for wheezing  or shortness of breath.    Marland Kitchen aspirin EC 81 MG tablet Take 81 mg by mouth daily.    Marland Kitchen atorvastatin (LIPITOR) 40 MG tablet Take 1 tablet (40 mg total) by mouth daily at 6 PM. 30 tablet 2  . ergocalciferol (VITAMIN D2) 50000 units capsule Take 50,000 Units by mouth once a week.    . ferrous sulfate 325 (65 FE) MG tablet Take 1 tablet (325 mg total) by mouth daily. 30 tablet 0  . furosemide (LASIX) 20 MG tablet Take 3 tablets (60 mg total) 2 (two) times daily by mouth. 30 tablet 0  . gabapentin (NEURONTIN) 400 MG capsule Take 400 mg by mouth 2 (two) times daily.    Marland Kitchen glipiZIDE (GLUCOTROL) 5 MG tablet Take 5 mg by mouth daily before breakfast.    . lisinopril (PRINIVIL,ZESTRIL) 20 MG tablet Take 20 mg by mouth 2 (two) times daily.    . metFORMIN (GLUCOPHAGE-XR) 500 MG 24 hr tablet Take 500 mg by mouth 2 (two) times daily.    . metoprolol tartrate (LOPRESSOR) 25 MG tablet Take 0.5 tablets (12.5 mg total) by mouth 2 (two) times daily. 60 tablet 2  . potassium chloride (K-DUR) 10 MEQ tablet Take 10 mEq by mouth daily.    . TRAVATAN Z 0.004 % SOLN ophthalmic solution Place 1 drop into both eyes at bedtime.     No current facility-administered medications for this visit.     SURGICAL HISTORY:  Past Surgical History:  Procedure Laterality Date  . ABDOMINAL HYSTERECTOMY    . ANTERIOR AND POSTERIOR REPAIR N/A 12/16/2014   Procedure: ANTERIOR (CYSTOCELE) AND POSTERIOR REPAIR (RECTOCELE);  Surgeon: Crawford Givens, MD;  Location: [facility];  Service: Gynecology;  Laterality: N/A;  . APPENDECTOMY    . BOWEL RESECTION    . BREAST BIOPSY Left   . COLONOSCOPY    . VIDEO BRONCHOSCOPY WITH ENDOBRONCHIAL ULTRASOUND N/A 12/17/2015   Procedure: VIDEO BRONCHOSCOPY WITH ENDOBRONCHIAL ULTRASOUND;  Surgeon: Ivin Poot, MD;  Location: [facility];  Service: Thoracic;  Laterality: N/A;    REVIEW OF SYSTEMS:  A comprehensive review of systems was negative except for: Constitutional: positive for fatigue Musculoskeletal:  positive for arthralgias and muscle weakness   PHYSICAL EXAMINATION: General appearance: alert, cooperative, fatigued and no distress Head: Normocephalic, without obvious abnormality, atraumatic Neck: no adenopathy, no JVD, supple, symmetrical, trachea midline and thyroid not enlarged, symmetric, no tenderness/mass/nodules Lymph nodes: Cervical, supraclavicular, and axillary nodes normal. Resp: clear to auscultation bilaterally Back: symmetric, no curvature. ROM normal. No CVA tenderness. Cardio: regular rate and rhythm, S1, S2 normal, no murmur, click, rub or gallop GI: soft, non-tender; bowel sounds normal; no masses,  no organomegaly Extremities: extremities normal, atraumatic, no cyanosis or edema  ECOG PERFORMANCE STATUS: 1 - Symptomatic but completely ambulatory  Blood pressure (!) 156/67, pulse 63, temperature 98.1 F (36.7 C), temperature source Oral, resp. rate 18, height 5\' 2"  (1.575 m), weight 118 lb 12.8 oz (53.9 kg), SpO2 100 %.  LABORATORY DATA: Lab Results  Component Value Date   WBC 6.6 03/11/2018   HGB 10.2 (L) 03/11/2018   HCT 33.9 (L) 03/11/2018   MCV 83.5 03/11/2018   PLT 189 03/11/2018      Chemistry      Component Value Date/Time   NA 142 03/11/2018 1142   NA 133 (L) 08/01/2017 1005   K 3.7 03/11/2018 1142   K 4.1 08/01/2017 1005   CL 105 03/11/2018 1142   CO2 28 03/11/2018 1142   CO2 27 08/01/2017 1005   BUN 23 03/11/2018 1142   BUN 10.4 08/01/2017 1005   CREATININE 1.43 (H) 03/11/2018 1142   CREATININE 0.7 08/01/2017 1005      Component Value Date/Time   CALCIUM 9.8 03/11/2018 1142   CALCIUM 9.1 08/01/2017 1005   ALKPHOS 85 03/11/2018 1142   ALKPHOS 77 08/01/2017 1005   AST 14 03/11/2018 1142   AST 51 (H) 08/01/2017 1005   ALT 9 03/11/2018 1142   ALT 63 (H) 08/01/2017 1005   BILITOT 0.4 03/11/2018 1142   BILITOT 0.40 08/01/2017 1005       RADIOGRAPHIC STUDIES: No results found.  ASSESSMENT AND PLAN: This is a very pleasant 84 years  old stage IA non-small cell lung cancer presented with right upper lobe pulmonary nodule status post curative stereotactic body radiotherapy. The patient is current on observation. I recommended for her to have repeat CT scan of the chest performed in the next few days for further evaluation of the suspicious enlarging lesion in the right upper lobe. If the scan showed no concerning findings for disease progression, I will see her back for follow-up visit in 6 months with repeat CT scan of the chest. For hypertension I strongly recommend for the patient to take her blood pressure medication as prescribed and to discuss with her family physician for adjustment of her medication if needed. The patient was advised to call immediately if she has any concerning symptoms in  the interval. The patient voices understanding of current disease status and treatment options and is in agreement with the current care plan. All questions were answered. The patient knows to call the clinic with any problems, questions or concerns. We can certainly see the patient much sooner if necessary. I spent 10 minutes counseling the patient face to face. The total time spent in the appointment was 15 minutes.  Disclaimer: This note was dictated with voice recognition software. Similar sounding words can inadvertently be transcribed and may not be corrected upon review.

## 2018-05-10 ENCOUNTER — Telehealth: Payer: Self-pay | Admitting: Medical Oncology

## 2018-05-10 NOTE — Telephone Encounter (Signed)
Radiology asking if June 11th ct should be with contrast. I told them yes . I( it was ordered without contrast ) -they will change it.

## 2018-05-13 ENCOUNTER — Telehealth: Payer: Self-pay | Admitting: Internal Medicine

## 2018-05-13 ENCOUNTER — Other Ambulatory Visit: Payer: Self-pay | Admitting: Medical Oncology

## 2018-05-13 DIAGNOSIS — C3490 Malignant neoplasm of unspecified part of unspecified bronchus or lung: Secondary | ICD-10-CM

## 2018-05-13 NOTE — Telephone Encounter (Signed)
Attempted to leave vm re lab that was added for tomorrow - pt did not pick up.

## 2018-05-14 ENCOUNTER — Other Ambulatory Visit: Payer: Medicare Other

## 2018-05-14 ENCOUNTER — Ambulatory Visit (HOSPITAL_COMMUNITY): Admission: RE | Admit: 2018-05-14 | Payer: Medicare Other | Source: Ambulatory Visit

## 2018-05-20 ENCOUNTER — Other Ambulatory Visit: Payer: Self-pay | Admitting: Medical Oncology

## 2018-05-20 DIAGNOSIS — C3490 Malignant neoplasm of unspecified part of unspecified bronchus or lung: Secondary | ICD-10-CM

## 2018-05-21 ENCOUNTER — Telehealth: Payer: Self-pay | Admitting: Internal Medicine

## 2018-05-21 NOTE — Telephone Encounter (Signed)
Called pt re appt that was added per 6/17 sch msg - spoke w/ pt re appts.

## 2018-05-22 ENCOUNTER — Ambulatory Visit (HOSPITAL_COMMUNITY)
Admission: RE | Admit: 2018-05-22 | Discharge: 2018-05-22 | Disposition: A | Discharge status: Home / Self Care | Payer: Medicare Other | Source: Ambulatory Visit | Attending: Internal Medicine | Admitting: Internal Medicine

## 2018-05-22 ENCOUNTER — Encounter (HOSPITAL_COMMUNITY): Payer: Self-pay

## 2018-05-22 ENCOUNTER — Inpatient Hospital Stay: Payer: Medicare Other

## 2018-05-22 DIAGNOSIS — J439 Emphysema, unspecified: Secondary | ICD-10-CM | POA: Insufficient documentation

## 2018-05-22 DIAGNOSIS — C3490 Malignant neoplasm of unspecified part of unspecified bronchus or lung: Secondary | ICD-10-CM | POA: Diagnosis not present

## 2018-05-22 DIAGNOSIS — R911 Solitary pulmonary nodule: Secondary | ICD-10-CM | POA: Diagnosis not present

## 2018-05-22 DIAGNOSIS — I10 Essential (primary) hypertension: Secondary | ICD-10-CM | POA: Diagnosis not present

## 2018-05-22 DIAGNOSIS — I700 Atherosclerosis of aorta: Secondary | ICD-10-CM | POA: Insufficient documentation

## 2018-05-22 DIAGNOSIS — R5383 Other fatigue: Secondary | ICD-10-CM | POA: Diagnosis not present

## 2018-05-22 DIAGNOSIS — M1990 Unspecified osteoarthritis, unspecified site: Secondary | ICD-10-CM | POA: Diagnosis not present

## 2018-05-22 DIAGNOSIS — C3411 Malignant neoplasm of upper lobe, right bronchus or lung: Secondary | ICD-10-CM | POA: Diagnosis not present

## 2018-05-22 LAB — CBC WITH DIFFERENTIAL (CANCER CENTER ONLY)
Basophils Absolute: 0.1 10*3/uL (ref 0.0–0.1)
Basophils Relative: 1 %
EOS ABS: 0.7 10*3/uL — AB (ref 0.0–0.5)
EOS PCT: 11 %
HCT: 32.8 % — ABNORMAL LOW (ref 34.8–46.6)
Hemoglobin: 10.4 g/dL — ABNORMAL LOW (ref 11.6–15.9)
LYMPHS ABS: 1.2 10*3/uL (ref 0.9–3.3)
LYMPHS PCT: 21 %
MCH: 26.1 pg (ref 25.1–34.0)
MCHC: 31.8 g/dL (ref 31.5–36.0)
MCV: 82.2 fL (ref 79.5–101.0)
MONO ABS: 0.7 10*3/uL (ref 0.1–0.9)
Monocytes Relative: 12 %
Neutro Abs: 3.2 10*3/uL (ref 1.5–6.5)
Neutrophils Relative %: 55 %
PLATELETS: 188 10*3/uL (ref 145–400)
RBC: 4 MIL/uL (ref 3.70–5.45)
RDW: 15.8 % — AB (ref 11.2–14.5)
WBC: 5.9 10*3/uL (ref 3.9–10.3)

## 2018-05-22 LAB — CMP (CANCER CENTER ONLY)
ALBUMIN: 4.2 g/dL (ref 3.5–5.0)
ALT: 6 U/L (ref 0–55)
AST: 19 U/L (ref 5–34)
Alkaline Phosphatase: 83 U/L (ref 40–150)
Anion gap: 7 (ref 3–11)
BILIRUBIN TOTAL: 0.3 mg/dL (ref 0.2–1.2)
BUN: 22 mg/dL (ref 7–26)
CHLORIDE: 104 mmol/L (ref 98–109)
CO2: 29 mmol/L (ref 22–29)
CREATININE: 1.42 mg/dL — AB (ref 0.60–1.10)
Calcium: 9.9 mg/dL (ref 8.4–10.4)
GFR, EST NON AFRICAN AMERICAN: 33 mL/min — AB (ref >60)
GFR, Est AFR Am: 38 mL/min — ABNORMAL LOW (ref >60)
GLUCOSE: 158 mg/dL — AB (ref 70–140)
POTASSIUM: 3.6 mmol/L (ref 3.5–5.1)
Sodium: 140 mmol/L (ref 136–145)
TOTAL PROTEIN: 7 g/dL (ref 6.4–8.3)

## 2018-05-22 MED ORDER — IOHEXOL 300 MG/ML  SOLN
75.0000 mL | Freq: Once | INTRAMUSCULAR | Status: AC | PRN
  Administered 2018-05-22: 75 mL via INTRAVENOUS

## 2018-05-23 ENCOUNTER — Telehealth: Payer: Self-pay | Admitting: *Deleted

## 2018-05-23 NOTE — Telephone Encounter (Signed)
-----   Message from Curt Bears, MD sent at 05/23/2018  8:43 AM EDT ----- Scan is good. Come back in December. ----- Message ----- From: Interface, Rad Results In Sent: 05/22/2018   5:07 PM To: Curt Bears, MD

## 2018-05-23 NOTE — Telephone Encounter (Signed)
Notified pt scan looked good, return in december

## 2018-07-01 DIAGNOSIS — E7800 Pure hypercholesterolemia, unspecified: Secondary | ICD-10-CM | POA: Diagnosis not present

## 2018-07-01 DIAGNOSIS — E559 Vitamin D deficiency, unspecified: Secondary | ICD-10-CM | POA: Diagnosis not present

## 2018-07-01 DIAGNOSIS — C3490 Malignant neoplasm of unspecified part of unspecified bronchus or lung: Secondary | ICD-10-CM | POA: Diagnosis not present

## 2018-07-01 DIAGNOSIS — D638 Anemia in other chronic diseases classified elsewhere: Secondary | ICD-10-CM | POA: Diagnosis not present

## 2018-07-01 DIAGNOSIS — J4521 Mild intermittent asthma with (acute) exacerbation: Secondary | ICD-10-CM | POA: Diagnosis not present

## 2018-07-01 DIAGNOSIS — E1165 Type 2 diabetes mellitus with hyperglycemia: Secondary | ICD-10-CM | POA: Diagnosis not present

## 2018-07-01 DIAGNOSIS — E113299 Type 2 diabetes mellitus with mild nonproliferative diabetic retinopathy without macular edema, unspecified eye: Secondary | ICD-10-CM | POA: Diagnosis not present

## 2018-07-01 DIAGNOSIS — J441 Chronic obstructive pulmonary disease with (acute) exacerbation: Secondary | ICD-10-CM | POA: Diagnosis not present

## 2018-07-01 DIAGNOSIS — I119 Hypertensive heart disease without heart failure: Secondary | ICD-10-CM | POA: Diagnosis not present

## 2018-07-01 DIAGNOSIS — Z79899 Other long term (current) drug therapy: Secondary | ICD-10-CM | POA: Diagnosis not present

## 2018-07-01 DIAGNOSIS — G250 Essential tremor: Secondary | ICD-10-CM | POA: Diagnosis not present

## 2018-07-01 DIAGNOSIS — G64 Other disorders of peripheral nervous system: Secondary | ICD-10-CM | POA: Diagnosis not present

## 2018-07-03 ENCOUNTER — Ambulatory Visit (INDEPENDENT_AMBULATORY_CARE_PROVIDER_SITE_OTHER): Payer: Medicare Other | Admitting: Podiatry

## 2018-07-03 DIAGNOSIS — B351 Tinea unguium: Secondary | ICD-10-CM | POA: Diagnosis not present

## 2018-07-03 DIAGNOSIS — M21371 Foot drop, right foot: Secondary | ICD-10-CM

## 2018-07-03 DIAGNOSIS — M79674 Pain in right toe(s): Secondary | ICD-10-CM | POA: Diagnosis not present

## 2018-07-03 DIAGNOSIS — M79675 Pain in left toe(s): Secondary | ICD-10-CM

## 2018-07-03 NOTE — Progress Notes (Signed)
Subjective:   Patient ID: Erin Carr, female   DOB: 84 y.o.   MRN: [id number]   HPI Patient presents with thick brittle nailbeds 1-5 both feet that are painful.  Patient states that she is recently moved and someone has misplaced her brace.   ROS      Objective:  Physical Exam  Thick yellow brittle nailbeds 1-5 both feet that are painful in the corners     Assessment:  Mycotic nail infection with pain 1-5 both feet.  Pedal pulses present bilateral.  No callus lesions no ulcerations skin warm and dry.     Plan:  Debrided nailbeds 1-5 both feet with no iatrogenic bleeding noted.  Diabetic paperwork was initiated today, patient is to follow-up with Liliane Channel to be measured and molded for shoes.  She is also going to discuss replacing her missing brace with Rick at that time.

## 2018-08-13 ENCOUNTER — Ambulatory Visit: Payer: Self-pay | Admitting: Orthotics

## 2018-08-13 DIAGNOSIS — M79674 Pain in right toe(s): Principal | ICD-10-CM

## 2018-08-13 DIAGNOSIS — B351 Tinea unguium: Secondary | ICD-10-CM

## 2018-08-13 DIAGNOSIS — M21371 Foot drop, right foot: Secondary | ICD-10-CM

## 2018-08-13 DIAGNOSIS — M79675 Pain in left toe(s): Principal | ICD-10-CM

## 2018-08-13 DIAGNOSIS — M79671 Pain in right foot: Secondary | ICD-10-CM

## 2018-08-13 NOTE — Progress Notes (Signed)
Patient was measured for med necessity extra depth shoes (x2) w/ 3 pair (x6) custom f/o. Patient was measured w/ brannock to determine size and width.  Foam impression mold was achieved and deemed appropriate for fabrication of  cmfo.   See DPM chart notes for further documentation and dx codes for determination of medical necessity.  Appropriate forms will be sent to PCP to verify and sign off on medical necessity.   ALSO patient getting replacement AFO brace due to one lost.  Cost of replacement $525

## 2018-08-19 DIAGNOSIS — I252 Old myocardial infarction: Secondary | ICD-10-CM | POA: Diagnosis not present

## 2018-08-19 DIAGNOSIS — E785 Hyperlipidemia, unspecified: Secondary | ICD-10-CM | POA: Diagnosis not present

## 2018-08-19 DIAGNOSIS — E119 Type 2 diabetes mellitus without complications: Secondary | ICD-10-CM | POA: Diagnosis not present

## 2018-08-19 DIAGNOSIS — D649 Anemia, unspecified: Secondary | ICD-10-CM | POA: Diagnosis not present

## 2018-08-19 DIAGNOSIS — I10 Essential (primary) hypertension: Secondary | ICD-10-CM | POA: Diagnosis not present

## 2018-08-19 DIAGNOSIS — E559 Vitamin D deficiency, unspecified: Secondary | ICD-10-CM | POA: Diagnosis not present

## 2018-08-19 DIAGNOSIS — F17290 Nicotine dependence, other tobacco product, uncomplicated: Secondary | ICD-10-CM | POA: Diagnosis not present

## 2018-08-19 DIAGNOSIS — I25118 Atherosclerotic heart disease of native coronary artery with other forms of angina pectoris: Secondary | ICD-10-CM | POA: Diagnosis not present

## 2018-08-19 DIAGNOSIS — I509 Heart failure, unspecified: Secondary | ICD-10-CM | POA: Diagnosis not present

## 2018-09-09 ENCOUNTER — Other Ambulatory Visit: Payer: Medicare Other | Admitting: Orthotics

## 2018-09-10 ENCOUNTER — Ambulatory Visit: Payer: Medicare Other | Admitting: Orthotics

## 2018-09-10 DIAGNOSIS — Z87448 Personal history of other diseases of urinary system: Secondary | ICD-10-CM

## 2018-09-10 DIAGNOSIS — M21371 Foot drop, right foot: Secondary | ICD-10-CM

## 2018-09-10 DIAGNOSIS — M79671 Pain in right foot: Secondary | ICD-10-CM

## 2018-09-10 NOTE — Progress Notes (Signed)
Picked up replacement AFO for one which she lost.

## 2018-09-26 ENCOUNTER — Emergency Department (HOSPITAL_COMMUNITY): Payer: Medicare Other

## 2018-09-26 ENCOUNTER — Emergency Department (HOSPITAL_COMMUNITY)
Admission: EM | Admit: 2018-09-26 | Discharge: 2018-09-26 | Disposition: A | Discharge status: Home / Self Care | Payer: Medicare Other | Attending: Emergency Medicine | Admitting: Emergency Medicine

## 2018-09-26 ENCOUNTER — Encounter (HOSPITAL_COMMUNITY): Payer: Self-pay | Admitting: Emergency Medicine

## 2018-09-26 ENCOUNTER — Other Ambulatory Visit: Payer: Self-pay

## 2018-09-26 DIAGNOSIS — N390 Urinary tract infection, site not specified: Secondary | ICD-10-CM | POA: Diagnosis not present

## 2018-09-26 DIAGNOSIS — S42294A Other nondisplaced fracture of upper end of right humerus, initial encounter for closed fracture: Secondary | ICD-10-CM

## 2018-09-26 DIAGNOSIS — W1830XA Fall on same level, unspecified, initial encounter: Secondary | ICD-10-CM | POA: Diagnosis not present

## 2018-09-26 DIAGNOSIS — R0902 Hypoxemia: Secondary | ICD-10-CM | POA: Diagnosis not present

## 2018-09-26 DIAGNOSIS — I10 Essential (primary) hypertension: Secondary | ICD-10-CM | POA: Insufficient documentation

## 2018-09-26 DIAGNOSIS — Z7984 Long term (current) use of oral hypoglycemic drugs: Secondary | ICD-10-CM | POA: Diagnosis not present

## 2018-09-26 DIAGNOSIS — Y999 Unspecified external cause status: Secondary | ICD-10-CM | POA: Insufficient documentation

## 2018-09-26 DIAGNOSIS — J449 Chronic obstructive pulmonary disease, unspecified: Secondary | ICD-10-CM | POA: Insufficient documentation

## 2018-09-26 DIAGNOSIS — Z79899 Other long term (current) drug therapy: Secondary | ICD-10-CM | POA: Insufficient documentation

## 2018-09-26 DIAGNOSIS — Y939 Activity, unspecified: Secondary | ICD-10-CM | POA: Diagnosis not present

## 2018-09-26 DIAGNOSIS — I252 Old myocardial infarction: Secondary | ICD-10-CM | POA: Insufficient documentation

## 2018-09-26 DIAGNOSIS — Y92009 Unspecified place in unspecified non-institutional (private) residence as the place of occurrence of the external cause: Secondary | ICD-10-CM | POA: Diagnosis not present

## 2018-09-26 DIAGNOSIS — M25511 Pain in right shoulder: Secondary | ICD-10-CM | POA: Diagnosis not present

## 2018-09-26 DIAGNOSIS — W19XXXA Unspecified fall, initial encounter: Secondary | ICD-10-CM | POA: Diagnosis not present

## 2018-09-26 DIAGNOSIS — Z7982 Long term (current) use of aspirin: Secondary | ICD-10-CM | POA: Diagnosis not present

## 2018-09-26 DIAGNOSIS — Z87891 Personal history of nicotine dependence: Secondary | ICD-10-CM | POA: Insufficient documentation

## 2018-09-26 DIAGNOSIS — S4991XA Unspecified injury of right shoulder and upper arm, initial encounter: Secondary | ICD-10-CM | POA: Diagnosis present

## 2018-09-26 DIAGNOSIS — Z85118 Personal history of other malignant neoplasm of bronchus and lung: Secondary | ICD-10-CM | POA: Insufficient documentation

## 2018-09-26 DIAGNOSIS — E119 Type 2 diabetes mellitus without complications: Secondary | ICD-10-CM | POA: Diagnosis not present

## 2018-09-26 LAB — BASIC METABOLIC PANEL
Anion gap: 10 (ref 5–15)
BUN: 23 mg/dL (ref 8–23)
CHLORIDE: 102 mmol/L (ref 98–111)
CO2: 30 mmol/L (ref 22–32)
CREATININE: 1.15 mg/dL — AB (ref 0.44–1.00)
Calcium: 9.5 mg/dL (ref 8.9–10.3)
GFR calc Af Amer: 48 mL/min — ABNORMAL LOW (ref >60)
GFR calc non Af Amer: 42 mL/min — ABNORMAL LOW (ref >60)
GLUCOSE: 149 mg/dL — AB (ref 70–99)
Potassium: 3.4 mmol/L — ABNORMAL LOW (ref 3.5–5.1)
Sodium: 142 mmol/L (ref 135–145)

## 2018-09-26 LAB — CBC WITH DIFFERENTIAL/PLATELET
Abs Immature Granulocytes: 0.02 10*3/uL (ref 0.00–0.07)
BASOS PCT: 0 %
Basophils Absolute: 0 10*3/uL (ref 0.0–0.1)
EOS ABS: 0.2 10*3/uL (ref 0.0–0.5)
Eosinophils Relative: 2 %
HCT: 37.3 % (ref 36.0–46.0)
Hemoglobin: 11.1 g/dL — ABNORMAL LOW (ref 12.0–15.0)
Immature Granulocytes: 0 %
Lymphocytes Relative: 10 %
Lymphs Abs: 0.7 10*3/uL (ref 0.7–4.0)
MCH: 26.7 pg (ref 26.0–34.0)
MCHC: 29.8 g/dL — ABNORMAL LOW (ref 30.0–36.0)
MCV: 89.7 fL (ref 80.0–100.0)
MONOS PCT: 10 %
Monocytes Absolute: 0.7 10*3/uL (ref 0.1–1.0)
NEUTROS PCT: 78 %
Neutro Abs: 5.1 10*3/uL (ref 1.7–7.7)
Platelets: 172 10*3/uL (ref 150–400)
RBC: 4.16 MIL/uL (ref 3.87–5.11)
RDW: 13.4 % (ref 11.5–15.5)
WBC: 6.7 10*3/uL (ref 4.0–10.5)
nRBC: 0 % (ref 0.0–0.2)

## 2018-09-26 LAB — URINALYSIS, ROUTINE W REFLEX MICROSCOPIC
Bilirubin Urine: NEGATIVE
GLUCOSE, UA: NEGATIVE mg/dL
Ketones, ur: NEGATIVE mg/dL
Nitrite: NEGATIVE
PH: 7 (ref 5.0–8.0)
Protein, ur: NEGATIVE mg/dL
SPECIFIC GRAVITY, URINE: 1.011 (ref 1.005–1.030)

## 2018-09-26 LAB — CBG MONITORING, ED: Glucose-Capillary: 116 mg/dL — ABNORMAL HIGH (ref 70–99)

## 2018-09-26 MED ORDER — CEPHALEXIN 250 MG PO CAPS: 250.0000 mg | ORAL_CAPSULE | Freq: Four times a day (QID) | ORAL | 0 refills | Status: DC

## 2018-09-26 MED ORDER — BACITRACIN ZINC 500 UNIT/GM EX OINT
TOPICAL_OINTMENT | CUTANEOUS | Status: AC
  Administered 2018-09-26: 1
  Filled 2018-09-26: qty 0.9

## 2018-09-26 MED ORDER — ACETAMINOPHEN 325 MG PO TABS
650.0000 mg | ORAL_TABLET | Freq: Once | ORAL | Status: AC
  Administered 2018-09-26: 650 mg via ORAL
  Filled 2018-09-26: qty 2

## 2018-09-26 NOTE — Discharge Instructions (Addendum)
Wear the sling on your right arm for comfort.  Use ice on the sore area 3 or 4 times a day for 20 minutes.  Use Tylenol 650 mg every 4 hours as needed for pain.  We are sending a prescription for antibiotic to treat a UTI to your pharmacy.  Make sure that you are drinking plenty of fluid and eating 3 meals each day.  Return here, if needed, for problems.

## 2018-09-26 NOTE — ED Triage Notes (Signed)
Patient from home lives with sister who says that she fell twice today. BP195/79 (has not taken BP meds this morning. 95% on RA, CBG 87. Right shoulder pain. Unclear whether or not the patient had mechanical falls or syncopal in nature. 12 lead ekg was unremarkable en route.

## 2018-09-26 NOTE — ED Notes (Signed)
Bed: EH20 Expected date:  Expected time:  Means of arrival:  Comments: EMS-fall

## 2018-09-26 NOTE — ED Provider Notes (Signed)
[facility] DEPT Provider Note   CSN: [id number] Arrival date & time: 09/26/18  1029     History   Chief Complaint Chief Complaint  Patient presents with  . Fall    HPI Erin Carr is a 84 y.o. female.  HPI   Presents for evaluation of "passing out," injuring her right shoulder.  States this occurred while she was at home, without known provocation.  Family members helped her get up afterwards.  She denies recent illnesses including fever, chills, cough, shortness of breath, chest pain, weakness or dizziness.  There are no other known modifying factors.  Past Medical History:  Diagnosis Date  . Adenocarcinoma of right lung, stage 1 ([place]) 11/16/2016  . Arthritis   . Asthma   . COPD (chronic obstructive pulmonary disease) ([place])   . Diabetes mellitus without complication ([place])    Type II  . Family history of adverse reaction to anesthesia    "aunt had PONV"  . History of radiation therapy 12/19/16-12/27/16   SBRT to right upper lobe 54 Gy in 3 fractions  . Hypercholesteremia   . Hypertension   . Osteoporosis   . Positive PPD 12/16   PER PCP  . Shortness of breath dyspnea   . Tremors of nervous system    Left hand  . Urinary frequency   . Vitamin D deficiency   . Wears glasses     Patient Active Problem List   Diagnosis Date Noted  . Overweight with body mass index (BMI) 25.0-29.9 04/03/2018  . History of cystocele 04/03/2018  . Candidiasis of vagina 04/03/2018  . Diabetes mellitus ([place]) 04/03/2018  . Hyperlipidemia 04/03/2018  . Hypertensive disorder 04/03/2018  . Postmenopausal bleeding 04/03/2018  . Urinary incontinence 04/03/2018  . Acute CHF (congestive heart failure) ([place]) 10/22/2017  . Foot drop 10/22/2017  . Facial droop 10/22/2017  . NSTEMI (non-ST elevated myocardial infarction) ([place]) 10/22/2017  . Fall 10/22/2017  . Leukocytosis 10/22/2017  . Radiation pneumonitis ([place]) 10/22/2017  . Foot drop, right   .  Shortness of breath 08/09/2017  . Microcytic anemia 07/18/2017  . CAP (community acquired pneumonia) 07/18/2017  . Asthma, chronic, unspecified asthma severity, with acute exacerbation 07/18/2017  . Type 2 diabetes mellitus without complication ([place]) 46/27/0350  . Adenocarcinoma of right lung, stage 1 ([place]) 11/16/2016  . Neoplasm of uncertain behavior of right upper lobe of lung 12/01/2015  . Positive PPD   . Pelvic prolapse 12/16/2014    Past Surgical History:  Procedure Laterality Date  . ABDOMINAL HYSTERECTOMY    . ANTERIOR AND POSTERIOR REPAIR N/A 12/16/2014   Procedure: ANTERIOR (CYSTOCELE) AND POSTERIOR REPAIR (RECTOCELE);  Surgeon: Crawford Givens, MD;  Location: [facility];  Service: Gynecology;  Laterality: N/A;  . APPENDECTOMY    . BOWEL RESECTION    . BREAST BIOPSY Left   . COLONOSCOPY    . VIDEO BRONCHOSCOPY WITH ENDOBRONCHIAL ULTRASOUND N/A 12/17/2015   Procedure: VIDEO BRONCHOSCOPY WITH ENDOBRONCHIAL ULTRASOUND;  Surgeon: Ivin Poot, MD;  Location: [facility] OR;  Service: Thoracic;  Laterality: N/A;     OB History   None      Home Medications    Prior to Admission medications   Medication Sig Start Date End Date Taking? Authorizing Provider  albuterol (PROVENTIL HFA;VENTOLIN HFA) 108 (90 Base) MCG/ACT inhaler Inhale 1 puff into the lungs every 4 (four) hours as needed for wheezing or shortness of breath.   Yes [provider]  amLODipine (NORVASC) 2.5 MG tablet Take  2.5 mg by mouth daily. 09/11/18  Yes [provider]  aspirin EC 81 MG tablet Take 81 mg by mouth daily.   Yes [provider]  atorvastatin (LIPITOR) 40 MG tablet Take 1 tablet (40 mg total) by mouth daily at 6 PM. 10/25/17  Yes Danford, Suann Larry, MD  ergocalciferol (VITAMIN D2) 50000 units capsule Take 50,000 Units by mouth once a week. On Monday   Yes [provider]  ferrous sulfate 325 (65 FE) MG tablet Take 1 tablet (325 mg total) by mouth daily. Patient taking  differently: Take 325 mg by mouth 3 (three) times a week.  07/18/17  Yes Leaphart, Zack Seal, PA-C  furosemide (LASIX) 20 MG tablet Take 3 tablets (60 mg total) 2 (two) times daily by mouth. 10/09/17  Yes Caccavale, Sophia, PA-C  gabapentin (NEURONTIN) 400 MG capsule Take 400 mg by mouth 2 (two) times daily.   Yes [provider]  glipiZIDE (GLUCOTROL) 5 MG tablet Take 5 mg by mouth daily before breakfast.   Yes [provider]  lisinopril (PRINIVIL,ZESTRIL) 20 MG tablet Take 20 mg by mouth 2 (two) times daily.   Yes [provider]  metFORMIN (GLUCOPHAGE-XR) 500 MG 24 hr tablet Take 500 mg by mouth 2 (two) times daily. 06/20/17  Yes [provider]  metoprolol tartrate (LOPRESSOR) 25 MG tablet Take 0.5 tablets (12.5 mg total) by mouth 2 (two) times daily. Patient taking differently: Take 25 mg by mouth 2 (two) times daily.  10/25/17  Yes Danford, Suann Larry, MD  potassium chloride (K-DUR) 10 MEQ tablet Take 10 mEq by mouth daily.   Yes [provider]  cephALEXin (KEFLEX) 250 MG capsule Take 1 capsule (250 mg total) by mouth 4 (four) times daily. 09/26/18   Daleen Bo, MD    Family History Family History  Problem Relation Age of Onset  . Hypertension Father   . Diabetes Father     Social History Social History   Tobacco Use  . Smoking status: Former Smoker    Packs/day: 0.50    Years: 40.00    Pack years: 20.00    Types: Cigarettes    Last attempt to quit: 12/08/2001    Years since quitting: 16.8  . Smokeless tobacco: Never Used  . Tobacco comment: quit about 15-16 years ago; 12/09/2015  Substance Use Topics  . Alcohol use: No  . Drug use: No     Allergies   Patient has no known allergies.   Review of Systems Review of Systems  All other systems reviewed and are negative.    Physical Exam Updated Vital Signs BP (!) 164/67   Pulse 67   Temp 98.4 F (36.9 C) (Oral)   Resp 14   SpO2 95%   Physical Exam    Constitutional: She is oriented to person, place, and time. She appears well-developed. No distress.  Elderly, frail  HENT:  Head: Normocephalic and atraumatic.  Eyes: Pupils are equal, round, and reactive to light. Conjunctivae and EOM are normal.  Neck: Normal range of motion and phonation normal. Neck supple.  Cardiovascular: Normal rate and regular rhythm.  Pulmonary/Chest: Effort normal and breath sounds normal. No respiratory distress. She exhibits no tenderness.  Abdominal: Soft. She exhibits no distension. There is no tenderness. There is no guarding.  Musculoskeletal: Normal range of motion.  Right shoulder tender with some swelling, but no deformity.  Somewhat diminished flexion of the right shoulder secondary to pain.  Neurological: She is alert and oriented  to person, place, and time. She exhibits normal muscle tone.  Skin: Skin is warm and dry.  Psychiatric: She has a normal mood and affect. Her behavior is normal.  Nursing note and vitals reviewed.    ED Treatments / Results  Labs (all labs ordered are listed, but only abnormal results are displayed) Labs Reviewed  BASIC METABOLIC PANEL - Abnormal; Notable for the following components:      Result Value   Potassium 3.4 (*)    Glucose, Bld 149 (*)    Creatinine, Ser 1.15 (*)    GFR calc non Af Amer 42 (*)    GFR calc Af Amer 48 (*)    All other components within normal limits  CBC WITH DIFFERENTIAL/PLATELET - Abnormal; Notable for the following components:   Hemoglobin 11.1 (*)    MCHC 29.8 (*)    All other components within normal limits  URINALYSIS, ROUTINE W REFLEX MICROSCOPIC - Abnormal; Notable for the following components:   APPearance CLOUDY (*)    Hgb urine dipstick SMALL (*)    Leukocytes, UA MODERATE (*)    Bacteria, UA MANY (*)    All other components within normal limits  CBG MONITORING, ED - Abnormal; Notable for the following components:   Glucose-Capillary 116 (*)    All other components within  normal limits    EKG EKG Interpretation  Date/Time:  Thursday September 26 2018 10:46:54 EDT Ventricular Rate:  65 PR Interval:    QRS Duration: 85 QT Interval:  456 QTC Calculation: 475 R Axis:   46 Text Interpretation:  Sinus rhythm Abnormal R-wave progression, early transition since last tracing no significant change Confirmed by Daleen Bo [phone]) on 09/26/2018 11:03:06 AM   Radiology Dg Shoulder Right  Result Date: 09/26/2018 CLINICAL DATA:  Right shoulder pain after fall today. EXAM: RIGHT SHOULDER - 2+ VIEW COMPARISON:  Radiographs of October 22, 2017. FINDINGS: Moderately displaced fracture is seen involving the proximal right humeral head and neck. Joint spaces are unremarkable. No abnormality seen involving the ribs. IMPRESSION: Moderately displaced proximal right humeral head and neck fracture. Electronically Signed   By: Marijo Conception, M.D.   On: 09/26/2018 12:46    Procedures Procedures (including critical care time)  Medications Ordered in ED Medications  bacitracin 500 UNIT/GM ointment (1 application  Given 54/62/70 1056)  acetaminophen (TYLENOL) tablet 650 mg (650 mg Oral Given 09/26/18 1428)       Initial Impression / Assessment and Plan / ED Course  I have reviewed the triage vital signs and the nursing notes.  Pertinent labs & imaging results that were available during my care of the patient were reviewed by me and considered in my medical decision making (see chart for details).      Patient Vitals for the past 24 hrs:  BP Temp Temp src Pulse Resp SpO2  09/26/18 1330 (!) 164/67 - - - 14 -  09/26/18 1313 136/62 - - 67 18 95 %  09/26/18 1047 (!) 196/69 98.4 F (36.9 C) Oral 69 18 98 %    Right arm shoulder immobilizer applied by nursing prior to discharge.  At discharge- reevaluation with update and discussion. After initial assessment and treatment, an updated evaluation reveals she remains comfortable and has no further complaints.   Findings discussed with patient and her sister who is with her in the exam room at this time.  Sister states that she will be able to help the patient at home.  All questions were answered.  Daleen Bo   Medical Decision Making: Fall with possible syncope, injuring right shoulder.  Patient probably weakened by urinary tract infection found on screening evaluation.  Doubt serious bacterial infection, metabolic instability or impending vascular collapse.  CRITICAL CARE-no Performed by: Daleen Bo   Nursing Notes Reviewed/ Care Coordinated Applicable Imaging Reviewed Interpretation of Laboratory Data incorporated into ED treatment  The patient appears reasonably screened and/or stabilized for discharge and I doubt any other medical condition or other [facility] requiring further screening, evaluation, or treatment in the ED at this time prior to discharge.  Plan: Home Medications-usual medicines, use Tylenol for pain; Home Treatments-shoulder immobilizer, cryotherapy; return here if the recommended treatment, does not improve the symptoms; Recommended follow up-PCP checkup 1 week.  Orthopedic follow-up 1 to 2 weeks regarding proximal humerus fracture.    Final Clinical Impressions(s) / ED Diagnoses   Final diagnoses:  Fall, initial encounter  Other closed nondisplaced fracture of proximal end of right humerus, initial encounter  Urinary tract infection without hematuria, site unspecified    ED Discharge Orders         Ordered    cephALEXin (KEFLEX) 250 MG capsule  4 times daily     09/26/18 1419           Daleen Bo, MD 09/27/18 1047

## 2018-09-26 NOTE — ED Notes (Signed)
Purewick have been inserted, just waiting on pt to be able to give a urine sample.

## 2018-09-27 DIAGNOSIS — M25511 Pain in right shoulder: Secondary | ICD-10-CM | POA: Diagnosis not present

## 2018-10-02 ENCOUNTER — Ambulatory Visit (INDEPENDENT_AMBULATORY_CARE_PROVIDER_SITE_OTHER): Payer: Medicare Other | Admitting: Podiatry

## 2018-10-02 DIAGNOSIS — B351 Tinea unguium: Secondary | ICD-10-CM

## 2018-10-02 DIAGNOSIS — M79674 Pain in right toe(s): Secondary | ICD-10-CM | POA: Diagnosis not present

## 2018-10-02 DIAGNOSIS — M79675 Pain in left toe(s): Secondary | ICD-10-CM | POA: Diagnosis not present

## 2018-10-02 NOTE — Patient Instructions (Signed)

## 2018-10-11 DIAGNOSIS — M25511 Pain in right shoulder: Secondary | ICD-10-CM | POA: Diagnosis not present

## 2018-10-20 ENCOUNTER — Encounter: Payer: Self-pay | Admitting: Podiatry

## 2018-10-20 NOTE — Progress Notes (Signed)
Subjective: Erin Carr presents today for preventative foot care. She has h/o  painful, discolored, thick toenails which interfere with daily activities and routine tasks.  Pain is aggravated when wearing enclosed shoe gear. Pain is getting progressively worse and relieved with periodic professional debridement.  Erin Carr is inquiring about the status of her diabetic shoes on today. Dr. Amalia Hailey submitted paperwork for her on her last visit on 07/03/2018.  Objective: Vascular Examination: Capillary refill time <3 seconds x 10 digits Dorsalis pedis and posterior tibial pulses present b/l No digital hair x 10 digits Skin temperature warm to warm b/l  Dermatological Examination: Skin with normal turgor, texture and tone b/l Toenails 1-5 b/l discolored, thick, dystrophic with subungual debris and pain with palpation to nailbeds due to thickness of nails.  Musculoskeletal: Muscle strength 5/5 to all LE muscle groups of LLE Foot drop left RLE   Neurological: Sensation intact with 10 gram monofilament. Vibratory sensation intact.  Assessment: Painful onychomycosis toenails 1-5 b/l  Foot drop RLE  Plan: 1. Investigated status of her diabetic shoe form. It has been faxed to her PCP's office multiple times with no return of form. Patient was given a copy of the form to hand deliver to her PCP's office to obtain a signature. 2. Toenails 1-5 b/l were debrided in length and girth without iatrogenic bleeding. 3. Patient to continue soft, supportive shoe gear 4. Patient to report any pedal injuries to medical professional immediately. 5. Follow up 3 months. Patient/POA to call should there be a concern in the interim.

## 2018-10-21 DIAGNOSIS — E119 Type 2 diabetes mellitus without complications: Secondary | ICD-10-CM | POA: Diagnosis not present

## 2018-10-21 DIAGNOSIS — E559 Vitamin D deficiency, unspecified: Secondary | ICD-10-CM | POA: Diagnosis not present

## 2018-10-21 DIAGNOSIS — J4520 Mild intermittent asthma, uncomplicated: Secondary | ICD-10-CM | POA: Diagnosis not present

## 2018-10-21 DIAGNOSIS — I739 Peripheral vascular disease, unspecified: Secondary | ICD-10-CM | POA: Diagnosis not present

## 2018-10-21 DIAGNOSIS — B351 Tinea unguium: Secondary | ICD-10-CM | POA: Diagnosis not present

## 2018-10-21 DIAGNOSIS — D638 Anemia in other chronic diseases classified elsewhere: Secondary | ICD-10-CM | POA: Diagnosis not present

## 2018-10-21 DIAGNOSIS — J441 Chronic obstructive pulmonary disease with (acute) exacerbation: Secondary | ICD-10-CM | POA: Diagnosis not present

## 2018-10-21 DIAGNOSIS — I119 Hypertensive heart disease without heart failure: Secondary | ICD-10-CM | POA: Diagnosis not present

## 2018-10-21 DIAGNOSIS — E7800 Pure hypercholesterolemia, unspecified: Secondary | ICD-10-CM | POA: Diagnosis not present

## 2018-10-21 DIAGNOSIS — E1165 Type 2 diabetes mellitus with hyperglycemia: Secondary | ICD-10-CM | POA: Diagnosis not present

## 2018-10-21 DIAGNOSIS — C3490 Malignant neoplasm of unspecified part of unspecified bronchus or lung: Secondary | ICD-10-CM | POA: Diagnosis not present

## 2018-10-21 DIAGNOSIS — Z79899 Other long term (current) drug therapy: Secondary | ICD-10-CM | POA: Diagnosis not present

## 2018-10-28 ENCOUNTER — Encounter (HOSPITAL_COMMUNITY): Payer: Self-pay | Admitting: Emergency Medicine

## 2018-10-28 ENCOUNTER — Ambulatory Visit (INDEPENDENT_AMBULATORY_CARE_PROVIDER_SITE_OTHER): Payer: Medicare Other

## 2018-10-28 ENCOUNTER — Ambulatory Visit (HOSPITAL_COMMUNITY)
Admission: EM | Admit: 2018-10-28 | Discharge: 2018-10-28 | Disposition: A | Discharge status: Home / Self Care | Payer: Medicare Other | Attending: Family Medicine | Admitting: Family Medicine

## 2018-10-28 DIAGNOSIS — S42212A Unspecified displaced fracture of surgical neck of left humerus, initial encounter for closed fracture: Secondary | ICD-10-CM | POA: Diagnosis not present

## 2018-10-28 DIAGNOSIS — S42352A Displaced comminuted fracture of shaft of humerus, left arm, initial encounter for closed fracture: Secondary | ICD-10-CM | POA: Diagnosis not present

## 2018-10-28 NOTE — ED Notes (Signed)
Assisted patient with taking off shirt and into gown.

## 2018-10-28 NOTE — Discharge Instructions (Addendum)
Spoke with on-call orthopedist and recommended sling and further evaluation in the office.   Continue with sling Continue conservative management of rest, ice, elevation Continue with tylenol as needed for pain Follow up with orthopedist for further evaluation and management Return or go to the ER if you have any new or worsening symptoms (fever, chills, chest pain, abdominal pain, changes in bowel or bladder habits, pain radiating into lower legs, etc...)

## 2018-10-28 NOTE — ED Provider Notes (Signed)
[facility]   [id number] 10/28/18 Arrival Time: 10  CC: Left shoulder PAIN  SUBJECTIVE: History from: patient. Erin Carr is a 84 y.o. female complains of left arm pain and swelling that began 5 days ago.  Symptoms began after [place] injury.  Localizes the pain to the shoulder and upper arm.  Describes the pain as intermittent and 4/10.  Has tried OTC medications with temporary relief.  Symptoms are made worse with shoulder ROM.  Reports similar symptoms on the left shoulder. Complains of associated swelling, and ecchymosis. Denies fever, chills, erythema, weakness, numbness and tingling.      ROS: As per HPI.  Past Medical History:  Diagnosis Date  . Adenocarcinoma of right lung, stage 1 ([place]) 11/16/2016  . Arthritis   . Asthma   . COPD (chronic obstructive pulmonary disease) ([place])   . Diabetes mellitus without complication ([place])    Type II  . Family history of adverse reaction to anesthesia    "aunt had PONV"  . History of radiation therapy 12/19/16-12/27/16   SBRT to right upper lobe 54 Gy in 3 fractions  . Hypercholesteremia   . Hypertension   . Osteoporosis   . Positive PPD 12/16   PER PCP  . Shortness of breath dyspnea   . Tremors of nervous system    Left hand  . Urinary frequency   . Vitamin D deficiency   . Wears glasses    Past Surgical History:  Procedure Laterality Date  . ABDOMINAL HYSTERECTOMY    . ANTERIOR AND POSTERIOR REPAIR N/A 12/16/2014   Procedure: ANTERIOR (CYSTOCELE) AND POSTERIOR REPAIR (RECTOCELE);  Surgeon: Crawford Givens, MD;  Location: [facility];  Service: Gynecology;  Laterality: N/A;  . APPENDECTOMY    . BOWEL RESECTION    . BREAST BIOPSY Left   . COLONOSCOPY    . VIDEO BRONCHOSCOPY WITH ENDOBRONCHIAL ULTRASOUND N/A 12/17/2015   Procedure: VIDEO BRONCHOSCOPY WITH ENDOBRONCHIAL ULTRASOUND;  Surgeon: Ivin Poot, MD;  Location: [facility] OR;  Service: Thoracic;  Laterality: N/A;   No Known Allergies No current facility-administered  medications on file prior to encounter.    Current Outpatient Medications on File Prior to Encounter  Medication Sig Dispense Refill  . albuterol (PROVENTIL HFA;VENTOLIN HFA) 108 (90 Base) MCG/ACT inhaler Inhale 1 puff into the lungs every 4 (four) hours as needed for wheezing or shortness of breath.    Marland Kitchen amLODipine (NORVASC) 2.5 MG tablet Take 2.5 mg by mouth daily.  3  . aspirin EC 81 MG tablet Take 81 mg by mouth daily.    Marland Kitchen atorvastatin (LIPITOR) 40 MG tablet Take 1 tablet (40 mg total) by mouth daily at 6 PM. 30 tablet 2  . cephALEXin (KEFLEX) 250 MG capsule Take 1 capsule (250 mg total) by mouth 4 (four) times daily. 28 capsule 0  . ergocalciferol (VITAMIN D2) 50000 units capsule Take 50,000 Units by mouth once a week. On Monday    . ferrous sulfate 325 (65 FE) MG tablet Take 1 tablet (325 mg total) by mouth daily. (Patient taking differently: Take 325 mg by mouth 3 (three) times a week. ) 30 tablet 0  . furosemide (LASIX) 20 MG tablet Take 3 tablets (60 mg total) 2 (two) times daily by mouth. 30 tablet 0  . gabapentin (NEURONTIN) 400 MG capsule Take 400 mg by mouth 2 (two) times daily.    Marland Kitchen glipiZIDE (GLUCOTROL) 5 MG tablet Take 5 mg by mouth daily before breakfast.    . lisinopril (PRINIVIL,ZESTRIL)  20 MG tablet Take 20 mg by mouth 2 (two) times daily.    . metFORMIN (GLUCOPHAGE-XR) 500 MG 24 hr tablet Take 500 mg by mouth 2 (two) times daily.    . metoprolol tartrate (LOPRESSOR) 25 MG tablet Take 0.5 tablets (12.5 mg total) by mouth 2 (two) times daily. (Patient taking differently: Take 25 mg by mouth 2 (two) times daily. ) 60 tablet 2  . potassium chloride (K-DUR) 10 MEQ tablet Take 10 mEq by mouth daily.     Social History   Socioeconomic History  . Marital status: Single    Spouse name: Not on file  . Number of children: Not on file  . Years of education: Not on file  . Highest education level: Not on file  Occupational History  . Not on file  Social Needs  . Financial  resource strain: Not on file  . Food insecurity:    Worry: Not on file    Inability: Not on file  . Transportation needs:    Medical: Not on file    Non-medical: Not on file  Tobacco Use  . Smoking status: Former Smoker    Packs/day: 0.50    Years: 40.00    Pack years: 20.00    Types: Cigarettes    Last attempt to quit: 12/08/2001    Years since quitting: 16.8  . Smokeless tobacco: Never Used  . Tobacco comment: quit about 15-16 years ago; 12/09/2015  Substance and Sexual Activity  . Alcohol use: No  . Drug use: No  . Sexual activity: Not on file  Lifestyle  . Physical activity:    Days per week: Not on file    Minutes per session: Not on file  . Stress: Not on file  Relationships  . Social connections:    Talks on phone: Not on file    Gets together: Not on file    Attends religious service: Not on file    Active member of club or organization: Not on file    Attends meetings of clubs or organizations: Not on file    Relationship status: Not on file  . Intimate partner violence:    Fear of current or ex partner: Not on file    Emotionally abused: Not on file    Physically abused: Not on file    Forced sexual activity: Not on file  Other Topics Concern  . Not on file  Social History Narrative  . Not on file   Family History  Problem Relation Age of Onset  . Hypertension Father   . Diabetes Father     OBJECTIVE:  Vitals:   10/28/18 1516  BP: 101/72  Pulse: 78  Resp: 18  Temp: 98 F (36.7 C)  SpO2: 100%    General appearance: AOx3; in no acute distress.  Head: NCAT Lungs: CTA bilaterally Heart: Murmur present.  RT Radial pulses 2+. Cap refill <2 secs Musculoskeletal: Left shoulder Inspection: left arm swelling localize to upper arm and ecchymosis over the medial aspect of the elbow; skin warm to the touch Palpation: Tender to palpation over anterior shoulder ROM: LROM about the shoulder; pronates and supinates with minimal difficulty Strength: grip  strength intact Skin: warm and dry Neurologic: Sitting in wheelchair; Sensation intact about the upper extremities Psychological: alert and cooperative; normal mood and affect   DIAGNOSTIC STUDIES:  Dg Humerus Left  Result Date: 10/28/2018 CLINICAL DATA:  Initial evaluation for acute swelling and bruising status post recent fall. EXAM: LEFT HUMERUS -  2+ VIEW COMPARISON:  Prior radiograph from 02/07/2010. FINDINGS: Acute comminuted transverse fracture extending through the neck of the left humerus with slight medial and anterior displacement. Partial cephalad extension through the greater tuberosity. Humerus intact distally. Glenoid intact. AC joint remains approximated. Underlying advanced osteoarthritic changes noted. IMPRESSION: Mildly displaced comminuted transverse fracture through the left humeral neck as above. Electronically Signed   By: Jeannine Boga M.D.   On: 10/28/2018 16:15    ASSESSMENT & PLAN:  1. Closed displaced fracture of surgical neck of left humerus, unspecified fracture morphology, initial encounter    No orders of the defined types were placed in this encounter.  Spoke with on-call orthopedist, Dr. Marcelino Scot, recommended sling and further evaluation in the office.   Continue with sling Continue conservative management of rest, ice, elevation Continue with tylenol as needed for pain Follow up with orthopedist for further evaluation and management Return or go to the ER if you have any new or worsening symptoms (fever, chills, chest pain, abdominal pain, changes in bowel or bladder habits, pain radiating into lower legs, etc...)   Reviewed expectations re: course of current medical issues. Questions answered. Outlined signs and symptoms indicating need for more acute intervention. Patient verbalized understanding. After Visit Summary given.    Lestine Box, PA-C 10/28/18 2120

## 2018-10-28 NOTE — ED Triage Notes (Signed)
Pt states on Wednesday of last week she fell, states her legs gave out and she fell to her L side and put her arm out to catch herself. Pt c/o L arm pain and swelling. Pt wearing sling at home. Denies hitting head, pt states she has hx of falls.

## 2018-11-04 ENCOUNTER — Inpatient Hospital Stay: Payer: Medicare Other | Attending: Internal Medicine

## 2018-11-04 ENCOUNTER — Other Ambulatory Visit: Payer: Self-pay | Admitting: Internal Medicine

## 2018-11-04 DIAGNOSIS — R531 Weakness: Secondary | ICD-10-CM | POA: Insufficient documentation

## 2018-11-04 DIAGNOSIS — R5383 Other fatigue: Secondary | ICD-10-CM | POA: Insufficient documentation

## 2018-11-04 DIAGNOSIS — D649 Anemia, unspecified: Secondary | ICD-10-CM | POA: Insufficient documentation

## 2018-11-04 DIAGNOSIS — I10 Essential (primary) hypertension: Secondary | ICD-10-CM | POA: Diagnosis not present

## 2018-11-04 DIAGNOSIS — M25511 Pain in right shoulder: Secondary | ICD-10-CM | POA: Diagnosis not present

## 2018-11-04 DIAGNOSIS — R0609 Other forms of dyspnea: Secondary | ICD-10-CM | POA: Diagnosis not present

## 2018-11-04 DIAGNOSIS — C3411 Malignant neoplasm of upper lobe, right bronchus or lung: Secondary | ICD-10-CM | POA: Insufficient documentation

## 2018-11-04 DIAGNOSIS — M25512 Pain in left shoulder: Secondary | ICD-10-CM | POA: Diagnosis not present

## 2018-11-04 DIAGNOSIS — Z79899 Other long term (current) drug therapy: Secondary | ICD-10-CM | POA: Insufficient documentation

## 2018-11-04 DIAGNOSIS — C3490 Malignant neoplasm of unspecified part of unspecified bronchus or lung: Secondary | ICD-10-CM

## 2018-11-04 DIAGNOSIS — D539 Nutritional anemia, unspecified: Secondary | ICD-10-CM

## 2018-11-04 LAB — CMP (CANCER CENTER ONLY)
ALBUMIN: 3.4 g/dL — AB (ref 3.5–5.0)
ALT: 14 U/L (ref 0–44)
ANION GAP: 10 (ref 5–15)
AST: 23 U/L (ref 15–41)
Alkaline Phosphatase: 103 U/L (ref 38–126)
BILIRUBIN TOTAL: 0.6 mg/dL (ref 0.3–1.2)
BUN: 20 mg/dL (ref 8–23)
CHLORIDE: 109 mmol/L (ref 98–111)
CO2: 27 mmol/L (ref 22–32)
Calcium: 9.8 mg/dL (ref 8.9–10.3)
Creatinine: 1.28 mg/dL — ABNORMAL HIGH (ref 0.44–1.00)
GFR, Est AFR Am: 44 mL/min — ABNORMAL LOW (ref >60)
GFR, Estimated: 38 mL/min — ABNORMAL LOW (ref >60)
GLUCOSE: 104 mg/dL — AB (ref 70–99)
Potassium: 3.6 mmol/L (ref 3.5–5.1)
SODIUM: 146 mmol/L — AB (ref 135–145)
Total Protein: 6.8 g/dL (ref 6.5–8.1)

## 2018-11-04 LAB — CBC WITH DIFFERENTIAL (CANCER CENTER ONLY)
Abs Immature Granulocytes: 0.04 10*3/uL (ref 0.00–0.07)
BASOS ABS: 0 10*3/uL (ref 0.0–0.1)
BASOS PCT: 0 %
Eosinophils Absolute: 0.3 10*3/uL (ref 0.0–0.5)
Eosinophils Relative: 4 %
HEMATOCRIT: 25.2 % — AB (ref 36.0–46.0)
Hemoglobin: 7.3 g/dL — ABNORMAL LOW (ref 12.0–15.0)
IMMATURE GRANULOCYTES: 1 %
LYMPHS ABS: 1.2 10*3/uL (ref 0.7–4.0)
LYMPHS PCT: 14 %
MCH: 28.1 pg (ref 26.0–34.0)
MCHC: 29 g/dL — ABNORMAL LOW (ref 30.0–36.0)
MCV: 96.9 fL (ref 80.0–100.0)
MONO ABS: 0.7 10*3/uL (ref 0.1–1.0)
MONOS PCT: 8 %
NEUTROS PCT: 73 %
Neutro Abs: 5.9 10*3/uL (ref 1.7–7.7)
PLATELETS: 316 10*3/uL (ref 150–400)
RBC: 2.6 MIL/uL — AB (ref 3.87–5.11)
RDW: 17.4 % — ABNORMAL HIGH (ref 11.5–15.5)
WBC Count: 8.1 10*3/uL (ref 4.0–10.5)
nRBC: 0 % (ref 0.0–0.2)

## 2018-11-05 ENCOUNTER — Other Ambulatory Visit: Payer: Self-pay | Admitting: Medical Oncology

## 2018-11-05 DIAGNOSIS — D539 Nutritional anemia, unspecified: Secondary | ICD-10-CM

## 2018-11-06 ENCOUNTER — Encounter: Payer: Self-pay | Admitting: Internal Medicine

## 2018-11-06 ENCOUNTER — Other Ambulatory Visit: Payer: Self-pay | Admitting: *Deleted

## 2018-11-06 ENCOUNTER — Telehealth: Payer: Self-pay

## 2018-11-06 ENCOUNTER — Inpatient Hospital Stay: Payer: Medicare Other

## 2018-11-06 ENCOUNTER — Inpatient Hospital Stay (HOSPITAL_BASED_OUTPATIENT_CLINIC_OR_DEPARTMENT_OTHER): Payer: Medicare Other | Admitting: Internal Medicine

## 2018-11-06 VITALS — BP 179/66 | HR 78 | Temp 98.6°F | Resp 18 | Ht 62.0 in | Wt 115.4 lb

## 2018-11-06 DIAGNOSIS — C3490 Malignant neoplasm of unspecified part of unspecified bronchus or lung: Secondary | ICD-10-CM

## 2018-11-06 DIAGNOSIS — D509 Iron deficiency anemia, unspecified: Secondary | ICD-10-CM

## 2018-11-06 DIAGNOSIS — R531 Weakness: Secondary | ICD-10-CM

## 2018-11-06 DIAGNOSIS — C3411 Malignant neoplasm of upper lobe, right bronchus or lung: Secondary | ICD-10-CM | POA: Diagnosis not present

## 2018-11-06 DIAGNOSIS — D649 Anemia, unspecified: Secondary | ICD-10-CM | POA: Diagnosis not present

## 2018-11-06 DIAGNOSIS — I10 Essential (primary) hypertension: Secondary | ICD-10-CM

## 2018-11-06 DIAGNOSIS — R5383 Other fatigue: Secondary | ICD-10-CM | POA: Diagnosis not present

## 2018-11-06 DIAGNOSIS — Z23 Encounter for immunization: Secondary | ICD-10-CM | POA: Diagnosis not present

## 2018-11-06 DIAGNOSIS — R0609 Other forms of dyspnea: Secondary | ICD-10-CM

## 2018-11-06 DIAGNOSIS — C3491 Malignant neoplasm of unspecified part of right bronchus or lung: Secondary | ICD-10-CM

## 2018-11-06 DIAGNOSIS — D539 Nutritional anemia, unspecified: Secondary | ICD-10-CM

## 2018-11-06 LAB — CBC WITH DIFFERENTIAL/PLATELET
ABS IMMATURE GRANULOCYTES: 0.05 10*3/uL (ref 0.00–0.07)
BASOS ABS: 0 10*3/uL (ref 0.0–0.1)
Basophils Relative: 0 %
EOS ABS: 0.3 10*3/uL (ref 0.0–0.5)
Eosinophils Relative: 4 %
HCT: 27.2 % — ABNORMAL LOW (ref 36.0–46.0)
HEMATOCRIT: 27.7 % — AB (ref 36.0–46.0)
HEMOGLOBIN: 7.9 g/dL — AB (ref 12.0–15.0)
Hemoglobin: 7.9 g/dL — ABNORMAL LOW (ref 12.0–15.0)
IMMATURE GRANULOCYTES: 1 %
LYMPHS ABS: 1.1 10*3/uL (ref 0.7–4.0)
LYMPHS PCT: 14 %
MCH: 27.3 pg (ref 26.0–34.0)
MCH: 26.9 pg (ref 26.0–34.0)
MCHC: 29 g/dL — ABNORMAL LOW (ref 30.0–36.0)
MCHC: 28.5 g/dL — ABNORMAL LOW (ref 30.0–36.0)
MCV: 94.1 fL (ref 80.0–100.0)
MCV: 94.2 fL (ref 80.0–100.0)
MONOS PCT: 9 %
Monocytes Absolute: 0.7 10*3/uL (ref 0.1–1.0)
NEUTROS ABS: 5.8 10*3/uL (ref 1.7–7.7)
NEUTROS PCT: 72 %
NRBC: 0 % (ref 0.0–0.2)
Platelets: 385 10*3/uL (ref 150–400)
Platelets: 370 10*3/uL (ref 150–400)
RBC: 2.89 MIL/uL — ABNORMAL LOW (ref 3.87–5.11)
RBC: 2.94 MIL/uL — ABNORMAL LOW (ref 3.87–5.11)
RDW: 17.1 % — AB (ref 11.5–15.5)
RDW: 17.2 % — AB (ref 11.5–15.5)
WBC: 8 10*3/uL (ref 4.0–10.5)
WBC: 8.1 10*3/uL (ref 4.0–10.5)
nRBC: 0 % (ref 0.0–0.2)

## 2018-11-06 LAB — VITAMIN B12: Vitamin B-12: 204 pg/mL (ref 180–914)

## 2018-11-06 LAB — FOLATE: Folate: 16.3 ng/mL (ref >5.9)

## 2018-11-06 LAB — SAMPLE TO BLOOD BANK

## 2018-11-06 LAB — IRON AND TIBC
Iron: 38 ug/dL — ABNORMAL LOW (ref 41–142)
Saturation Ratios: 12 % — ABNORMAL LOW (ref 21–57)
TIBC: 308 ug/dL (ref 236–444)
UIBC: 269 ug/dL (ref 120–384)

## 2018-11-06 LAB — FERRITIN: FERRITIN: 116 ng/mL (ref 11–307)

## 2018-11-06 NOTE — Progress Notes (Signed)
[place] [phone]   Fax:([phone]  OFFICE PROGRESS NOTE  Vincente Liberty, MD [place] [address]  DIAGNOSIS: Stage IA (T1b, N0, M0) non-small cell lung cancer, adenocarcinoma presented with right upper lobe lung nodule diagnosed in November 2017.  PRIOR THERAPY: Curative stereotactic body radiotherapy to the right upper lobe lung nodule under the care of Dr. Sondra Come completed on 12/27/2016.  CURRENT THERAPY: None.  INTERVAL HISTORY: Erin Carr 84 y.o. female returns to the clinic today for follow-up visit accompanied by her daughter.  The patient continues to complain of increasing fatigue and weakness.  She denied having any recent chest pain but has shortness of breath with exertion with no cough or hemoptysis.  She denied having any fever or chills.  She has no nausea, vomiting, diarrhea or constipation.  She has no recent weight loss or night sweats.  She was supposed to have repeat CT scan of the chest before this visit but unfortunately it was not scheduled as ordered.  The patient was found on recent blood work to have significant decrease in her hemoglobin and hematocrit.  I ordered anemia panel before this visit and the patient is here today for evaluation and discussion of her lab results.   MEDICAL HISTORY: Past Medical History:  Diagnosis Date  . Adenocarcinoma of right lung, stage 1 ([facility]) 11/16/2016  . Arthritis   . Asthma   . COPD (chronic obstructive pulmonary disease) ([facility])   . Diabetes mellitus without complication ([facility])    Type II  . Family history of adverse reaction to anesthesia    "aunt had PONV"  . History of radiation therapy 12/19/16-12/27/16   SBRT to right upper lobe 54 Gy in 3 fractions  . Hypercholesteremia   . Hypertension   . Osteoporosis   . Positive PPD 12/16   PER PCP  . Shortness of breath dyspnea   . Tremors of nervous system    Left hand  . Urinary frequency   .  Vitamin D deficiency   . Wears glasses     ALLERGIES:  has No Known Allergies.  MEDICATIONS:  Current Outpatient Medications  Medication Sig Dispense Refill  . albuterol (PROVENTIL HFA;VENTOLIN HFA) 108 (90 Base) MCG/ACT inhaler Inhale 1 puff into the lungs every 4 (four) hours as needed for wheezing or shortness of breath.    Marland Kitchen amLODipine (NORVASC) 2.5 MG tablet Take 2.5 mg by mouth daily.  3  . aspirin EC 81 MG tablet Take 81 mg by mouth daily.    Marland Kitchen atorvastatin (LIPITOR) 40 MG tablet Take 1 tablet (40 mg total) by mouth daily at 6 PM. 30 tablet 2  . cephALEXin (KEFLEX) 250 MG capsule Take 1 capsule (250 mg total) by mouth 4 (four) times daily. 28 capsule 0  . ergocalciferol (VITAMIN D2) 50000 units capsule Take 50,000 Units by mouth once a week. On Monday    . ferrous sulfate 325 (65 FE) MG tablet Take 1 tablet (325 mg total) by mouth daily. (Patient taking differently: Take 325 mg by mouth 3 (three) times a week. ) 30 tablet 0  . furosemide (LASIX) 20 MG tablet Take 3 tablets (60 mg total) 2 (two) times daily by mouth. 30 tablet 0  . gabapentin (NEURONTIN) 400 MG capsule Take 400 mg by mouth 2 (two) times daily.    Marland Kitchen glipiZIDE (GLUCOTROL) 5 MG tablet Take 5 mg by mouth daily before breakfast.    . lisinopril (  PRINIVIL,ZESTRIL) 20 MG tablet Take 20 mg by mouth 2 (two) times daily.    . metFORMIN (GLUCOPHAGE-XR) 500 MG 24 hr tablet Take 500 mg by mouth 2 (two) times daily.    . metoprolol tartrate (LOPRESSOR) 25 MG tablet Take 0.5 tablets (12.5 mg total) by mouth 2 (two) times daily. (Patient taking differently: Take 25 mg by mouth 2 (two) times daily. ) 60 tablet 2  . potassium chloride (K-DUR) 10 MEQ tablet Take 10 mEq by mouth daily.     No current facility-administered medications for this visit.     SURGICAL HISTORY:  Past Surgical History:  Procedure Laterality Date  . ABDOMINAL HYSTERECTOMY    . ANTERIOR AND POSTERIOR REPAIR N/A 12/16/2014   Procedure: ANTERIOR (CYSTOCELE) AND  POSTERIOR REPAIR (RECTOCELE);  Surgeon: Crawford Givens, MD;  Location: [facility];  Service: Gynecology;  Laterality: N/A;  . APPENDECTOMY    . BOWEL RESECTION    . BREAST BIOPSY Left   . COLONOSCOPY    . VIDEO BRONCHOSCOPY WITH ENDOBRONCHIAL ULTRASOUND N/A 12/17/2015   Procedure: VIDEO BRONCHOSCOPY WITH ENDOBRONCHIAL ULTRASOUND;  Surgeon: Ivin Poot, MD;  Location: [facility];  Service: Thoracic;  Laterality: N/A;    REVIEW OF SYSTEMS:  Constitutional: positive for fatigue Eyes: negative Ears, nose, mouth, throat, and face: negative Respiratory: positive for dyspnea on exertion Cardiovascular: negative Gastrointestinal: negative Genitourinary:negative Integument/breast: negative Hematologic/lymphatic: negative Musculoskeletal:positive for arthralgias and muscle weakness Neurological: negative Behavioral/Psych: negative Endocrine: negative Allergic/Immunologic: negative   PHYSICAL EXAMINATION: General appearance: alert, cooperative, fatigued and no distress Head: Normocephalic, without obvious abnormality, atraumatic Neck: no adenopathy, no JVD, supple, symmetrical, trachea midline and thyroid not enlarged, symmetric, no tenderness/mass/nodules Lymph nodes: Cervical, supraclavicular, and axillary nodes normal. Resp: clear to auscultation bilaterally Back: symmetric, no curvature. ROM normal. No CVA tenderness. Cardio: regular rate and rhythm, S1, S2 normal, no murmur, click, rub or gallop GI: soft, non-tender; bowel sounds normal; no masses,  no organomegaly Extremities: extremities normal, atraumatic, no cyanosis or edema Neurologic: Alert and oriented X 3, normal strength and tone. Normal symmetric reflexes. Normal coordination and gait  ECOG PERFORMANCE STATUS: 1 - Symptomatic but completely ambulatory  Blood pressure (!) 179/66, pulse 78, temperature 98.6 F (37 C), temperature source Oral, resp. rate 18, height 5\' 2"  (1.575 m), weight 115 lb 6.4 oz (52.3 kg), SpO2 100  %.  LABORATORY DATA: Lab Results  Component Value Date   WBC 8.1 11/06/2018   HGB 7.9 (L) 11/06/2018   HCT 27.7 (L) 11/06/2018   MCV 94.2 11/06/2018   PLT 370 11/06/2018      Chemistry      Component Value Date/Time   NA 146 (H) 11/04/2018 1025   NA 133 (L) 08/01/2017 1005   K 3.6 11/04/2018 1025   K 4.1 08/01/2017 1005   CL 109 11/04/2018 1025   CO2 27 11/04/2018 1025   CO2 27 08/01/2017 1005   BUN 20 11/04/2018 1025   BUN 10.4 08/01/2017 1005   CREATININE 1.28 (H) 11/04/2018 1025   CREATININE 0.7 08/01/2017 1005      Component Value Date/Time   CALCIUM 9.8 11/04/2018 1025   CALCIUM 9.1 08/01/2017 1005   ALKPHOS 103 11/04/2018 1025   ALKPHOS 77 08/01/2017 1005   AST 23 11/04/2018 1025   AST 51 (H) 08/01/2017 1005   ALT 14 11/04/2018 1025   ALT 63 (H) 08/01/2017 1005   BILITOT 0.6 11/04/2018 1025   BILITOT 0.40 08/01/2017 1005       RADIOGRAPHIC STUDIES:  Dg Humerus Left  Result Date: 10/28/2018 CLINICAL DATA:  Initial evaluation for acute swelling and bruising status post recent fall. EXAM: LEFT HUMERUS - 2+ VIEW COMPARISON:  Prior radiograph from 02/07/2010. FINDINGS: Acute comminuted transverse fracture extending through the neck of the left humerus with slight medial and anterior displacement. Partial cephalad extension through the greater tuberosity. Humerus intact distally. Glenoid intact. AC joint remains approximated. Underlying advanced osteoarthritic changes noted. IMPRESSION: Mildly displaced comminuted transverse fracture through the left humeral neck as above. Electronically Signed   By: Jeannine Boga M.D.   On: 10/28/2018 16:15    ASSESSMENT AND PLAN: This is a very pleasant 84 years old stage IA non-small cell lung cancer presented with right upper lobe pulmonary nodule status post curative stereotactic body radiotherapy. The patient is current on observation. She was expected to have repeat CT scan of the chest before this visit but this was  not scheduled before her visit. I will arrange for the patient to have repeat CT scan of the chest performed in the next 1-2 weeks. For the significant anemia I order repeat CBC, iron study, ferritin, serum folate, vitamin B12 level as well as erythropoietin level.  I will also arrange for the patient to receive 2 units of PRBCs transfusion in the next few days.  She was also advised to reach out to her gastroenterologist for reevaluation. I will arrange for the patient to come back for follow-up visit in 2-3 weeks for evaluation and discussion of her scan results as well as recommendation regarding her anemia. For hypertension, she did not take her blood pressure medication earlier today.  I strongly encouraged the patient to take her blood pressure medication as prescribed and to reach out to her primary care physician for adjustment of her medication if needed. The patient voices understanding of current disease status and treatment options and is in agreement with the current care plan. All questions were answered. The patient knows to call the clinic with any problems, questions or concerns. We can certainly see the patient much sooner if necessary.  Disclaimer: This note was dictated with voice recognition software. Similar sounding words can inadvertently be transcribed and may not be corrected upon review.

## 2018-11-06 NOTE — Telephone Encounter (Signed)
Printed avs and calender of the upcoming appointment. Per 12/4 los, rescheduled ct appointment

## 2018-11-07 ENCOUNTER — Telehealth: Payer: Self-pay | Admitting: Internal Medicine

## 2018-11-07 LAB — ERYTHROPOIETIN: Erythropoietin: 59.4 m[IU]/mL — ABNORMAL HIGH (ref 2.6–18.5)

## 2018-11-07 NOTE — Telephone Encounter (Signed)
Scheduled appt per 12/4 sch message - pt niece is aware of appt date and time

## 2018-11-08 ENCOUNTER — Ambulatory Visit (HOSPITAL_COMMUNITY)
Admission: RE | Admit: 2018-11-08 | Discharge: 2018-11-08 | Disposition: A | Discharge status: Home / Self Care | Payer: Medicare Other | Source: Ambulatory Visit | Attending: Internal Medicine | Admitting: Internal Medicine

## 2018-11-08 ENCOUNTER — Other Ambulatory Visit: Payer: Self-pay | Admitting: *Deleted

## 2018-11-08 DIAGNOSIS — C3490 Malignant neoplasm of unspecified part of unspecified bronchus or lung: Secondary | ICD-10-CM | POA: Insufficient documentation

## 2018-11-08 DIAGNOSIS — D509 Iron deficiency anemia, unspecified: Secondary | ICD-10-CM | POA: Diagnosis not present

## 2018-11-08 DIAGNOSIS — C3491 Malignant neoplasm of unspecified part of right bronchus or lung: Secondary | ICD-10-CM | POA: Diagnosis not present

## 2018-11-08 DIAGNOSIS — D539 Nutritional anemia, unspecified: Secondary | ICD-10-CM

## 2018-11-09 ENCOUNTER — Inpatient Hospital Stay: Payer: Medicare Other

## 2018-11-09 DIAGNOSIS — R5383 Other fatigue: Secondary | ICD-10-CM | POA: Diagnosis not present

## 2018-11-09 DIAGNOSIS — R0609 Other forms of dyspnea: Secondary | ICD-10-CM | POA: Diagnosis not present

## 2018-11-09 DIAGNOSIS — C3411 Malignant neoplasm of upper lobe, right bronchus or lung: Secondary | ICD-10-CM | POA: Diagnosis not present

## 2018-11-09 DIAGNOSIS — D539 Nutritional anemia, unspecified: Secondary | ICD-10-CM

## 2018-11-09 DIAGNOSIS — D649 Anemia, unspecified: Secondary | ICD-10-CM | POA: Diagnosis not present

## 2018-11-09 DIAGNOSIS — R531 Weakness: Secondary | ICD-10-CM | POA: Diagnosis not present

## 2018-11-09 DIAGNOSIS — I10 Essential (primary) hypertension: Secondary | ICD-10-CM | POA: Diagnosis not present

## 2018-11-09 LAB — PREPARE RBC (CROSSMATCH)

## 2018-11-09 LAB — ABO/RH: ABO/RH(D): B POS

## 2018-11-09 MED ORDER — ACETAMINOPHEN 325 MG PO TABS
ORAL_TABLET | ORAL | Status: AC
  Filled 2018-11-09: qty 2

## 2018-11-09 MED ORDER — ACETAMINOPHEN 325 MG PO TABS
650.0000 mg | ORAL_TABLET | Freq: Once | ORAL | Status: AC
  Administered 2018-11-09: 650 mg via ORAL

## 2018-11-09 MED ORDER — SODIUM CHLORIDE 0.9% IV SOLUTION
250.0000 mL | Freq: Once | INTRAVENOUS | Status: AC
  Administered 2018-11-09: 250 mL via INTRAVENOUS
  Filled 2018-11-09: qty 250

## 2018-11-09 MED ORDER — SODIUM CHLORIDE 0.9% IV SOLUTION
250.0000 mL | Freq: Once | INTRAVENOUS | Status: DC
  Filled 2018-11-09: qty 250

## 2018-11-09 MED ORDER — DIPHENHYDRAMINE HCL 25 MG PO CAPS
ORAL_CAPSULE | ORAL | Status: AC
  Filled 2018-11-09: qty 1

## 2018-11-09 MED ORDER — DIPHENHYDRAMINE HCL 25 MG PO CAPS
25.0000 mg | ORAL_CAPSULE | Freq: Once | ORAL | Status: AC
  Administered 2018-11-09: 25 mg via ORAL

## 2018-11-09 MED ORDER — FUROSEMIDE 10 MG/ML IJ SOLN: 20.0000 mg | Freq: Once | INTRAMUSCULAR | Status: DC

## 2018-11-09 NOTE — Patient Instructions (Signed)
Blood Transfusion, Adult, Care After This sheet gives you information about how to care for yourself after your procedure. Your health care provider may also give you more specific instructions. If you have problems or questions, contact your health care provider. What can I expect after the procedure? After your procedure, it is common to have:  Bruising and soreness where the IV tube was inserted.  Headache.  Follow these instructions at home:  Take over-the-counter and prescription medicines only as told by your health care provider.  Return to your normal activities as told by your health care provider.  Follow instructions from your health care provider about how to take care of your IV insertion site. Make sure you: ? Wash your hands with soap and water before you change your bandage (dressing). If soap and water are not available, use hand sanitizer. ? Change your dressing as told by your health care provider.  Check your IV insertion site every day for signs of infection. Check for: ? More redness, swelling, or pain. ? More fluid or blood. ? Warmth. ? Pus or a bad smell. Contact a health care provider if:  You have more redness, swelling, or pain around the IV insertion site.  You have more fluid or blood coming from the IV insertion site.  Your IV insertion site feels warm to the touch.  You have pus or a bad smell coming from the IV insertion site.  Your urine turns pink, red, or brown.  You feel weak after doing your normal activities. Get help right away if:  You have signs of a serious allergic or immune system reaction, including: ? Itchiness. ? Hives. ? Trouble breathing. ? Anxiety. ? Chest or lower back pain. ? Fever, flushing, and chills. ? Rapid pulse. ? Rash. ? Diarrhea. ? Vomiting. ? Dark urine. ? Serious headache. ? Dizziness. ? Stiff neck. ? Yellow coloration of the face or the white parts of the eyes (jaundice). This information is not  intended to replace advice given to you by your health care provider. Make sure you discuss any questions you have with your health care provider. Document Released: 12/11/2014 Document Revised: 07/19/2016 Document Reviewed: 06/05/2016 Elsevier Interactive Patient Education  2018 [employer]vier Inc[employer]  

## 2018-11-09 NOTE — Progress Notes (Signed)
Lasix medication not given as patient had not taken any of her daily medications. Pt stated she will take her blood pressure medication upon arrival home. Pt denied any other complaints. Pt verbalized understanding and had no further questions.

## 2018-11-10 LAB — BPAM RBC
Blood Product Expiration Date: 201912252359
Blood Product Expiration Date: 201912272359
ISSUE DATE / TIME: 201912071007
ISSUE DATE / TIME: 201912071007
Unit Type and Rh: 7300
Unit Type and Rh: 7300

## 2018-11-10 LAB — TYPE AND SCREEN
ABO/RH(D): B POS
Antibody Screen: NEGATIVE
Unit division: 0
Unit division: 0

## 2018-11-18 DIAGNOSIS — E559 Vitamin D deficiency, unspecified: Secondary | ICD-10-CM | POA: Diagnosis not present

## 2018-11-18 DIAGNOSIS — I10 Essential (primary) hypertension: Secondary | ICD-10-CM | POA: Diagnosis not present

## 2018-11-18 DIAGNOSIS — F17290 Nicotine dependence, other tobacco product, uncomplicated: Secondary | ICD-10-CM | POA: Diagnosis not present

## 2018-11-18 DIAGNOSIS — I252 Old myocardial infarction: Secondary | ICD-10-CM | POA: Diagnosis not present

## 2018-11-18 DIAGNOSIS — E785 Hyperlipidemia, unspecified: Secondary | ICD-10-CM | POA: Diagnosis not present

## 2018-11-18 DIAGNOSIS — E119 Type 2 diabetes mellitus without complications: Secondary | ICD-10-CM | POA: Diagnosis not present

## 2018-11-18 DIAGNOSIS — I25118 Atherosclerotic heart disease of native coronary artery with other forms of angina pectoris: Secondary | ICD-10-CM | POA: Diagnosis not present

## 2018-11-18 DIAGNOSIS — D649 Anemia, unspecified: Secondary | ICD-10-CM | POA: Diagnosis not present

## 2018-11-18 DIAGNOSIS — I509 Heart failure, unspecified: Secondary | ICD-10-CM | POA: Diagnosis not present

## 2018-11-22 ENCOUNTER — Other Ambulatory Visit: Payer: Self-pay | Admitting: *Deleted

## 2018-11-22 DIAGNOSIS — D539 Nutritional anemia, unspecified: Secondary | ICD-10-CM

## 2018-11-25 ENCOUNTER — Telehealth: Payer: Self-pay

## 2018-11-25 ENCOUNTER — Encounter: Payer: Self-pay | Admitting: Internal Medicine

## 2018-11-25 ENCOUNTER — Inpatient Hospital Stay (HOSPITAL_BASED_OUTPATIENT_CLINIC_OR_DEPARTMENT_OTHER): Payer: Medicare Other | Admitting: Internal Medicine

## 2018-11-25 ENCOUNTER — Other Ambulatory Visit: Payer: Self-pay | Admitting: Medical Oncology

## 2018-11-25 ENCOUNTER — Inpatient Hospital Stay: Payer: Medicare Other

## 2018-11-25 VITALS — BP 188/65 | HR 64 | Temp 98.2°F | Resp 17 | Ht 62.0 in | Wt 115.7 lb

## 2018-11-25 DIAGNOSIS — I10 Essential (primary) hypertension: Secondary | ICD-10-CM

## 2018-11-25 DIAGNOSIS — D539 Nutritional anemia, unspecified: Secondary | ICD-10-CM

## 2018-11-25 DIAGNOSIS — C3411 Malignant neoplasm of upper lobe, right bronchus or lung: Secondary | ICD-10-CM | POA: Diagnosis not present

## 2018-11-25 DIAGNOSIS — C3490 Malignant neoplasm of unspecified part of unspecified bronchus or lung: Secondary | ICD-10-CM

## 2018-11-25 DIAGNOSIS — R531 Weakness: Secondary | ICD-10-CM | POA: Diagnosis not present

## 2018-11-25 DIAGNOSIS — R5383 Other fatigue: Secondary | ICD-10-CM | POA: Diagnosis not present

## 2018-11-25 DIAGNOSIS — D649 Anemia, unspecified: Secondary | ICD-10-CM | POA: Diagnosis not present

## 2018-11-25 DIAGNOSIS — C3491 Malignant neoplasm of unspecified part of right bronchus or lung: Secondary | ICD-10-CM

## 2018-11-25 DIAGNOSIS — R0609 Other forms of dyspnea: Secondary | ICD-10-CM | POA: Diagnosis not present

## 2018-11-25 LAB — CMP (CANCER CENTER ONLY)
ALT: 12 U/L (ref 0–44)
AST: 16 U/L (ref 15–41)
Albumin: 3.8 g/dL (ref 3.5–5.0)
Alkaline Phosphatase: 133 U/L — ABNORMAL HIGH (ref 38–126)
Anion gap: 9 (ref 5–15)
BUN: 16 mg/dL (ref 8–23)
CO2: 30 mmol/L (ref 22–32)
CREATININE: 1.13 mg/dL — AB (ref 0.44–1.00)
Calcium: 9.6 mg/dL (ref 8.9–10.3)
Chloride: 105 mmol/L (ref 98–111)
GFR, Est AFR Am: 51 mL/min — ABNORMAL LOW (ref >60)
GFR, Estimated: 44 mL/min — ABNORMAL LOW (ref >60)
Glucose, Bld: 155 mg/dL — ABNORMAL HIGH (ref 70–99)
Potassium: 3.6 mmol/L (ref 3.5–5.1)
Sodium: 144 mmol/L (ref 135–145)
Total Bilirubin: 0.4 mg/dL (ref 0.3–1.2)
Total Protein: 7 g/dL (ref 6.5–8.1)

## 2018-11-25 LAB — CBC WITH DIFFERENTIAL (CANCER CENTER ONLY)
Abs Immature Granulocytes: 0.01 10*3/uL (ref 0.00–0.07)
Basophils Absolute: 0.1 10*3/uL (ref 0.0–0.1)
Basophils Relative: 1 %
Eosinophils Absolute: 0.3 10*3/uL (ref 0.0–0.5)
Eosinophils Relative: 4 %
HCT: 42.6 % (ref 36.0–46.0)
Hemoglobin: 12.7 g/dL (ref 12.0–15.0)
Immature Granulocytes: 0 %
Lymphocytes Relative: 18 %
Lymphs Abs: 1.2 10*3/uL (ref 0.7–4.0)
MCH: 27.9 pg (ref 26.0–34.0)
MCHC: 29.8 g/dL — ABNORMAL LOW (ref 30.0–36.0)
MCV: 93.4 fL (ref 80.0–100.0)
Monocytes Absolute: 0.6 10*3/uL (ref 0.1–1.0)
Monocytes Relative: 9 %
NEUTROS ABS: 4.5 10*3/uL (ref 1.7–7.7)
Neutrophils Relative %: 68 %
Platelet Count: 207 10*3/uL (ref 150–400)
RBC: 4.56 MIL/uL (ref 3.87–5.11)
RDW: 14.7 % (ref 11.5–15.5)
WBC Count: 6.6 10*3/uL (ref 4.0–10.5)
nRBC: 0 % (ref 0.0–0.2)

## 2018-11-25 MED ORDER — CLONIDINE HCL 0.1 MG PO TABS
0.2000 mg | ORAL_TABLET | Freq: Once | ORAL | Status: AC
  Administered 2018-11-25: 0.2 mg via ORAL

## 2018-11-25 MED ORDER — CLONIDINE HCL 0.1 MG PO TABS
ORAL_TABLET | ORAL | Status: AC
  Filled 2018-11-25: qty 2

## 2018-11-25 NOTE — Telephone Encounter (Signed)
Printed avs and calender of upcoming appointment. Per 12/23 los 

## 2018-11-25 NOTE — Progress Notes (Signed)
[facility] Telephone:(336) [phone]   Fax:(336) [phone]  OFFICE PROGRESS NOTE  Vincente Liberty, MD [place] [address]  DIAGNOSIS: Stage IA (T1b, N0, M0) non-small cell lung cancer, adenocarcinoma presented with right upper lobe lung nodule diagnosed in November 2017.  PRIOR THERAPY: Curative stereotactic body radiotherapy to the right upper lobe lung nodule under the care of Dr. Sondra Come completed on 12/27/2016.  CURRENT THERAPY: None.  INTERVAL HISTORY: Erin Carr 84 y.o. female neck today for 56-month follow-up visit accompanied by her daughter.  The Erin Carr is feeling fine today with no concerning complaints except for mild cough.  Her blood pressure is uncontrolled and she took her blood pressure medication earlier today.  She denied having any chest pain, shortness of breath, or hemoptysis.  She has no weight loss or night sweats.  She has no nausea, vomiting, diarrhea or constipation.  The Erin Carr had repeat CT scan of the chest and she is here today for evaluation and discussion of her scan results.   MEDICAL HISTORY: Past Medical History:  Diagnosis Date  . Adenocarcinoma of right lung, stage 1 ([facility]) 11/16/2016  . Arthritis   . Asthma   . COPD (chronic obstructive pulmonary disease) ([facility])   . Diabetes mellitus without complication ([facility])    Type II  . Family history of adverse reaction to anesthesia    "aunt had PONV"  . History of radiation therapy 12/19/16-12/27/16   SBRT to right upper lobe 54 Gy in 3 fractions  . Hypercholesteremia   . Hypertension   . Osteoporosis   . Positive PPD 12/16   PER PCP  . Shortness of breath dyspnea   . Tremors of nervous system    Left hand  . Urinary frequency   . Vitamin D deficiency   . Wears glasses     ALLERGIES:  has No Known Allergies.  MEDICATIONS:  Current Outpatient Medications  Medication Sig Dispense Refill  . albuterol (PROVENTIL HFA;VENTOLIN HFA) 108 (90 Base)  MCG/ACT inhaler Inhale 1 puff into the lungs every 4 (four) hours as needed for wheezing or shortness of breath.    Marland Kitchen amLODipine (NORVASC) 2.5 MG tablet Take 2.5 mg by mouth daily.  3  . aspirin EC 81 MG tablet Take 81 mg by mouth daily.    Marland Kitchen atorvastatin (LIPITOR) 40 MG tablet Take 1 tablet (40 mg total) by mouth daily at 6 PM. 30 tablet 2  . cephALEXin (KEFLEX) 250 MG capsule Take 1 capsule (250 mg total) by mouth 4 (four) times daily. 28 capsule 0  . ergocalciferol (VITAMIN D2) 50000 units capsule Take 50,000 Units by mouth once a week. On Monday    . ferrous sulfate 325 (65 FE) MG tablet Take 1 tablet (325 mg total) by mouth daily. (Erin Carr taking differently: Take 325 mg by mouth 3 (three) times a week. ) 30 tablet 0  . furosemide (LASIX) 20 MG tablet Take 3 tablets (60 mg total) 2 (two) times daily by mouth. 30 tablet 0  . gabapentin (NEURONTIN) 400 MG capsule Take 400 mg by mouth 2 (two) times daily.    Marland Kitchen glipiZIDE (GLUCOTROL) 5 MG tablet Take 5 mg by mouth daily before breakfast.    . lisinopril (PRINIVIL,ZESTRIL) 20 MG tablet Take 20 mg by mouth 2 (two) times daily.    . metFORMIN (GLUCOPHAGE-XR) 500 MG 24 hr tablet Take 500 mg by mouth 2 (two) times daily.    . metoprolol tartrate (LOPRESSOR)  25 MG tablet Take 0.5 tablets (12.5 mg total) by mouth 2 (two) times daily. (Erin Carr taking differently: Take 25 mg by mouth 2 (two) times daily. ) 60 tablet 2  . potassium chloride (K-DUR) 10 MEQ tablet Take 10 mEq by mouth daily.     No current facility-administered medications for this visit.     SURGICAL HISTORY:  Past Surgical History:  Procedure Laterality Date  . ABDOMINAL HYSTERECTOMY    . ANTERIOR AND POSTERIOR REPAIR N/A 12/16/2014   Procedure: ANTERIOR (CYSTOCELE) AND POSTERIOR REPAIR (RECTOCELE);  Surgeon: Crawford Givens, MD;  Location: [facility];  Service: Gynecology;  Laterality: N/A;  . APPENDECTOMY    . BOWEL RESECTION    . BREAST BIOPSY Left   . COLONOSCOPY    . VIDEO  BRONCHOSCOPY WITH ENDOBRONCHIAL ULTRASOUND N/A 12/17/2015   Procedure: VIDEO BRONCHOSCOPY WITH ENDOBRONCHIAL ULTRASOUND;  Surgeon: Ivin Poot, MD;  Location: [facility];  Service: Thoracic;  Laterality: N/A;    REVIEW OF SYSTEMS:  A comprehensive review of systems was negative except for: Respiratory: positive for dyspnea on exertion   PHYSICAL EXAMINATION: General appearance: alert, cooperative, fatigued and no distress Head: Normocephalic, without obvious abnormality, atraumatic Neck: no adenopathy, no JVD, supple, symmetrical, trachea midline and thyroid not enlarged, symmetric, no tenderness/mass/nodules Lymph nodes: Cervical, supraclavicular, and axillary nodes normal. Resp: clear to auscultation bilaterally Back: symmetric, no curvature. ROM normal. No CVA tenderness. Cardio: regular rate and rhythm, S1, S2 normal, no murmur, click, rub or gallop GI: soft, non-tender; bowel sounds normal; no masses,  no organomegaly Extremities: extremities normal, atraumatic, no cyanosis or edema  ECOG PERFORMANCE STATUS: 1 - Symptomatic but completely ambulatory  Blood pressure (!) 191/78, pulse 64, temperature 98.2 F (36.8 C), temperature source Oral, resp. rate 17, height 5\' 2"  (1.575 m), weight 115 lb 11.2 oz (52.5 kg), SpO2 100 %.  LABORATORY DATA: Lab Results  Component Value Date   WBC 6.6 11/25/2018   HGB 12.7 11/25/2018   HCT 42.6 11/25/2018   MCV 93.4 11/25/2018   PLT 207 11/25/2018      Chemistry      Component Value Date/Time   NA 144 11/25/2018 1350   NA 133 (L) 08/01/2017 1005   K 3.6 11/25/2018 1350   K 4.1 08/01/2017 1005   CL 105 11/25/2018 1350   CO2 30 11/25/2018 1350   CO2 27 08/01/2017 1005   BUN 16 11/25/2018 1350   BUN 10.4 08/01/2017 1005   CREATININE 1.13 (H) 11/25/2018 1350   CREATININE 0.7 08/01/2017 1005      Component Value Date/Time   CALCIUM 9.6 11/25/2018 1350   CALCIUM 9.1 08/01/2017 1005   ALKPHOS 133 (H) 11/25/2018 1350   ALKPHOS 77  08/01/2017 1005   AST 16 11/25/2018 1350   AST 51 (H) 08/01/2017 1005   ALT 12 11/25/2018 1350   ALT 63 (H) 08/01/2017 1005   BILITOT 0.4 11/25/2018 1350   BILITOT 0.40 08/01/2017 1005       RADIOGRAPHIC STUDIES: Ct Chest Wo Contrast  Result Date: 11/11/2018 CLINICAL DATA:  Adenocarcinoma of the right lung. EXAM: CT CHEST WITHOUT CONTRAST TECHNIQUE: Multidetector CT imaging of the chest was performed following the standard protocol without IV contrast. COMPARISON:  05/22/2018 FINDINGS: Cardiovascular: The heart size appears normal. No pericardial effusion identified. Aortic atherosclerosis. Calcification in the RCA, LAD and left circumflex coronary arteries. Mediastinum/Nodes: Low-attenuation nodules within the thyroid gland are again noted, unchanged. Normal appearance of the thyroid gland. Normal appearance of the esophagus. No  mediastinal or hilar lymph nodes.Multiple small right axillary lymph nodes are identified measuring up to 9 mm. No supraclavicular or right axillary adenopathy. Lungs/Pleura: There is no pleural effusion identified. Fibrosis and masslike architectural distortion within the right lung is again identified compatible with changes of external beam radiation. The appearance is stable from previous exam. Small nodule within the posteromedial left lower lobe is unchanged measuring 4 mm, image 98/5. Calcified nodule within the perifissural left upper lobe is unchanged. Upper Abdomen: No acute abnormality identified. Musculoskeletal: Comminuted fracture deformity involving the proximal left humerus is again identified. There also appears to be a mild, impacted fracture deformity involving the surgical neck of the right humerus. IMPRESSION: 1. Stable appearance of changes secondary to external beam radiation within the right lung. No findings to suggest local tumor recurrence. 2. Interval development of bilateral proximal humeri impaction deformities. The left humeral fracture appears  extensively comminuted. 3. Aortic Atherosclerosis (ICD10-I70.0) and Emphysema (ICD10-J43.9). 4. Coronary artery atherosclerotic calcifications. Electronically Signed   By: Kerby Moors M.D.   On: 11/11/2018 10:11   Dg Humerus Left  Result Date: 10/28/2018 CLINICAL DATA:  Initial evaluation for acute swelling and bruising status post recent fall. EXAM: LEFT HUMERUS - 2+ VIEW COMPARISON:  Prior radiograph from 02/07/2010. FINDINGS: Acute comminuted transverse fracture extending through the neck of the left humerus with slight medial and anterior displacement. Partial cephalad extension through the greater tuberosity. Humerus intact distally. Glenoid intact. AC joint remains approximated. Underlying advanced osteoarthritic changes noted. IMPRESSION: Mildly displaced comminuted transverse fracture through the left humeral neck as above. Electronically Signed   By: Jeannine Boga M.D.   On: 10/28/2018 16:15    ASSESSMENT AND PLAN: This is a very pleasant 84 years old stage IA non-small cell lung cancer presented with right upper lobe pulmonary nodule status post curative stereotactic body radiotherapy. She has been in observation since that time.  The Erin Carr is doing fine with no concerning complaints. Repeat CT scan of the chest showed no concerning findings for disease recurrence or progression. I discussed the scan results with the Erin Carr and her daughter and recommended for her to continue on observation with repeat CT scan of the chest in 6 months. For hypertension I will give her a dose of clonidine 0.2 mg p.o. x1 today and the Erin Carr was advised to take her blood pressure medication as prescribed and to consult with her primary care physician for any adjustment needed for her medication. She was advised to call immediately if she has any concerning symptoms in the interval. The Erin Carr voices understanding of current disease status and treatment options and is in agreement with the  current care plan. All questions were answered. The Erin Carr knows to call the clinic with any problems, questions or concerns. We can certainly see the Erin Carr much sooner if necessary.  Disclaimer: This note was dictated with voice recognition software. Similar sounding words can inadvertently be transcribed and may not be corrected upon review.

## 2018-12-02 DIAGNOSIS — M25512 Pain in left shoulder: Secondary | ICD-10-CM | POA: Diagnosis not present

## 2018-12-02 DIAGNOSIS — M25511 Pain in right shoulder: Secondary | ICD-10-CM | POA: Diagnosis not present

## 2018-12-09 DIAGNOSIS — M25612 Stiffness of left shoulder, not elsewhere classified: Secondary | ICD-10-CM | POA: Diagnosis not present

## 2018-12-09 DIAGNOSIS — M25611 Stiffness of right shoulder, not elsewhere classified: Secondary | ICD-10-CM | POA: Diagnosis not present

## 2018-12-09 DIAGNOSIS — M25512 Pain in left shoulder: Secondary | ICD-10-CM | POA: Diagnosis not present

## 2018-12-09 DIAGNOSIS — M6281 Muscle weakness (generalized): Secondary | ICD-10-CM | POA: Diagnosis not present

## 2018-12-13 ENCOUNTER — Encounter: Payer: Self-pay | Admitting: Podiatry

## 2018-12-13 DIAGNOSIS — M6281 Muscle weakness (generalized): Secondary | ICD-10-CM | POA: Diagnosis not present

## 2018-12-13 DIAGNOSIS — M25611 Stiffness of right shoulder, not elsewhere classified: Secondary | ICD-10-CM | POA: Diagnosis not present

## 2018-12-13 DIAGNOSIS — M25512 Pain in left shoulder: Secondary | ICD-10-CM | POA: Diagnosis not present

## 2018-12-13 DIAGNOSIS — M25612 Stiffness of left shoulder, not elsewhere classified: Secondary | ICD-10-CM | POA: Diagnosis not present

## 2018-12-16 DIAGNOSIS — M25611 Stiffness of right shoulder, not elsewhere classified: Secondary | ICD-10-CM | POA: Diagnosis not present

## 2018-12-16 DIAGNOSIS — M6281 Muscle weakness (generalized): Secondary | ICD-10-CM | POA: Diagnosis not present

## 2018-12-16 DIAGNOSIS — M25512 Pain in left shoulder: Secondary | ICD-10-CM | POA: Diagnosis not present

## 2018-12-16 DIAGNOSIS — M25612 Stiffness of left shoulder, not elsewhere classified: Secondary | ICD-10-CM | POA: Diagnosis not present

## 2018-12-19 DIAGNOSIS — M25512 Pain in left shoulder: Secondary | ICD-10-CM | POA: Diagnosis not present

## 2018-12-19 DIAGNOSIS — M6281 Muscle weakness (generalized): Secondary | ICD-10-CM | POA: Diagnosis not present

## 2018-12-19 DIAGNOSIS — M25611 Stiffness of right shoulder, not elsewhere classified: Secondary | ICD-10-CM | POA: Diagnosis not present

## 2018-12-19 DIAGNOSIS — M25612 Stiffness of left shoulder, not elsewhere classified: Secondary | ICD-10-CM | POA: Diagnosis not present

## 2018-12-25 DIAGNOSIS — M6281 Muscle weakness (generalized): Secondary | ICD-10-CM | POA: Diagnosis not present

## 2018-12-25 DIAGNOSIS — M25612 Stiffness of left shoulder, not elsewhere classified: Secondary | ICD-10-CM | POA: Diagnosis not present

## 2018-12-25 DIAGNOSIS — M25512 Pain in left shoulder: Secondary | ICD-10-CM | POA: Diagnosis not present

## 2018-12-25 DIAGNOSIS — M25611 Stiffness of right shoulder, not elsewhere classified: Secondary | ICD-10-CM | POA: Diagnosis not present

## 2018-12-27 DIAGNOSIS — M25512 Pain in left shoulder: Secondary | ICD-10-CM | POA: Diagnosis not present

## 2018-12-27 DIAGNOSIS — M6281 Muscle weakness (generalized): Secondary | ICD-10-CM | POA: Diagnosis not present

## 2018-12-27 DIAGNOSIS — M25612 Stiffness of left shoulder, not elsewhere classified: Secondary | ICD-10-CM | POA: Diagnosis not present

## 2018-12-27 DIAGNOSIS — M25611 Stiffness of right shoulder, not elsewhere classified: Secondary | ICD-10-CM | POA: Diagnosis not present

## 2018-12-31 DIAGNOSIS — M25612 Stiffness of left shoulder, not elsewhere classified: Secondary | ICD-10-CM | POA: Diagnosis not present

## 2018-12-31 DIAGNOSIS — M6281 Muscle weakness (generalized): Secondary | ICD-10-CM | POA: Diagnosis not present

## 2018-12-31 DIAGNOSIS — M25512 Pain in left shoulder: Secondary | ICD-10-CM | POA: Diagnosis not present

## 2018-12-31 DIAGNOSIS — M25611 Stiffness of right shoulder, not elsewhere classified: Secondary | ICD-10-CM | POA: Diagnosis not present

## 2019-01-02 ENCOUNTER — Ambulatory Visit: Payer: Medicare Other | Admitting: Podiatry

## 2019-01-03 DIAGNOSIS — M6281 Muscle weakness (generalized): Secondary | ICD-10-CM | POA: Diagnosis not present

## 2019-01-03 DIAGNOSIS — M25612 Stiffness of left shoulder, not elsewhere classified: Secondary | ICD-10-CM | POA: Diagnosis not present

## 2019-01-03 DIAGNOSIS — M25611 Stiffness of right shoulder, not elsewhere classified: Secondary | ICD-10-CM | POA: Diagnosis not present

## 2019-01-03 DIAGNOSIS — M25512 Pain in left shoulder: Secondary | ICD-10-CM | POA: Diagnosis not present

## 2019-01-13 DIAGNOSIS — M25511 Pain in right shoulder: Secondary | ICD-10-CM | POA: Diagnosis not present

## 2019-01-13 DIAGNOSIS — M25512 Pain in left shoulder: Secondary | ICD-10-CM | POA: Diagnosis not present

## 2019-01-28 DIAGNOSIS — I739 Peripheral vascular disease, unspecified: Secondary | ICD-10-CM | POA: Diagnosis not present

## 2019-01-28 DIAGNOSIS — E559 Vitamin D deficiency, unspecified: Secondary | ICD-10-CM | POA: Diagnosis not present

## 2019-01-28 DIAGNOSIS — J441 Chronic obstructive pulmonary disease with (acute) exacerbation: Secondary | ICD-10-CM | POA: Diagnosis not present

## 2019-01-28 DIAGNOSIS — Z0001 Encounter for general adult medical examination with abnormal findings: Secondary | ICD-10-CM | POA: Diagnosis not present

## 2019-01-28 DIAGNOSIS — E1165 Type 2 diabetes mellitus with hyperglycemia: Secondary | ICD-10-CM | POA: Diagnosis not present

## 2019-01-28 DIAGNOSIS — Z79899 Other long term (current) drug therapy: Secondary | ICD-10-CM | POA: Diagnosis not present

## 2019-01-28 DIAGNOSIS — I119 Hypertensive heart disease without heart failure: Secondary | ICD-10-CM | POA: Diagnosis not present

## 2019-01-28 DIAGNOSIS — D638 Anemia in other chronic diseases classified elsewhere: Secondary | ICD-10-CM | POA: Diagnosis not present

## 2019-01-28 DIAGNOSIS — C182 Malignant neoplasm of ascending colon: Secondary | ICD-10-CM | POA: Diagnosis not present

## 2019-01-28 DIAGNOSIS — J4521 Mild intermittent asthma with (acute) exacerbation: Secondary | ICD-10-CM | POA: Diagnosis not present

## 2019-01-28 DIAGNOSIS — E7800 Pure hypercholesterolemia, unspecified: Secondary | ICD-10-CM | POA: Diagnosis not present

## 2019-01-28 DIAGNOSIS — E113299 Type 2 diabetes mellitus with mild nonproliferative diabetic retinopathy without macular edema, unspecified eye: Secondary | ICD-10-CM | POA: Diagnosis not present

## 2019-01-30 ENCOUNTER — Ambulatory Visit (INDEPENDENT_AMBULATORY_CARE_PROVIDER_SITE_OTHER): Payer: Medicare Other | Admitting: Podiatry

## 2019-01-30 ENCOUNTER — Other Ambulatory Visit: Payer: Self-pay

## 2019-01-30 DIAGNOSIS — M79674 Pain in right toe(s): Secondary | ICD-10-CM | POA: Diagnosis not present

## 2019-01-30 DIAGNOSIS — M79675 Pain in left toe(s): Secondary | ICD-10-CM | POA: Diagnosis not present

## 2019-01-30 DIAGNOSIS — B351 Tinea unguium: Secondary | ICD-10-CM

## 2019-01-30 NOTE — Patient Instructions (Signed)
Onychomycosis/Fungal Toenails  WHAT IS IT? An infection that lies within the keratin of your nail plate that is caused by a fungus.  WHY ME? Fungal infections affect all ages, sexes, races, and creeds.  There may be many factors that predispose you to a fungal infection such as age, coexisting medical conditions such as diabetes, or an autoimmune disease; stress, medications, fatigue, genetics, etc.  Bottom line: fungus thrives in a warm, moist environment and your shoes offer such a location.  IS IT CONTAGIOUS? Theoretically, yes.  You do not want to share shoes, nail clippers or files with someone who has fungal toenails.  Walking around barefoot in the same room or sleeping in the same bed is unlikely to transfer the organism.  It is important to realize, however, that fungus can spread easily from one nail to the next on the same foot.  HOW DO WE TREAT THIS?  There are several ways to treat this condition.  Treatment may depend on many factors such as age, medications, pregnancy, liver and kidney conditions, etc.  It is best to ask your doctor which options are available to you.  1. No treatment.   Unlike many other medical concerns, you can live with this condition.  However for many people this can be a painful condition and may lead to ingrown toenails or a bacterial infection.  It is recommended that you keep the nails cut short to help reduce the amount of fungal nail. 2. Topical treatment.  These range from herbal remedies to prescription strength nail lacquers.  About 40-50% effective, topicals require twice daily application for approximately 9 to 12 months or until an entirely new nail has grown out.  The most effective topicals are medical grade medications available through physicians offices. 3. Oral antifungal medications.  With an 80-90% cure rate, the most common oral medication requires 3 to 4 months of therapy and stays in your system for a year as the new nail grows out.  Oral  antifungal medications do require blood work to make sure it is a safe drug for you.  A liver function panel will be performed prior to starting the medication and after the first month of treatment.  It is important to have the blood work performed to avoid any harmful side effects.  In general, this medication safe but blood work is required. 4. Laser Therapy.  This treatment is performed by applying a specialized laser to the affected nail plate.  This therapy is noninvasive, fast, and non-painful.  It is not covered by insurance and is therefore, out of pocket.  The results have been very good with a 80-95% cure rate.  [facility] is the only practice in the area to offer this therapy. 5. Permanent Nail Avulsion.  Removing the entire nail so that a new nail will not grow back.  Foot Pain Many things can cause foot pain. Some common causes are:  An injury.  A sprain.  Arthritis.  Blisters.  Bunions. Follow these instructions at home: Pay attention to any changes in your symptoms. Take these actions to help with your discomfort:  If directed, put ice on the affected area: ? Put ice in a plastic bag. ? Place a towel between your skin and the bag. ? Leave the ice on for 15-20 minutes, 3?4 times a day for 2 days.  Take over-the-counter and prescription medicines only as told by your health care provider.  Wear comfortable, supportive shoes that fit you well.  Do not wear high heels.  Do not stand or walk for long periods of time.  Do not lift a lot of weight. This can put added pressure on your feet.  Do stretches to relieve foot pain and stiffness as told by your health care provider.  Rub your foot gently.  Keep your feet clean and dry. Contact a health care provider if:  Your pain does not get better after a few days of self-care.  Your pain gets worse.  You cannot stand on your foot. Get help right away if:  Your foot is numb or tingling.  Your foot or toes  are swollen.  Your foot or toes turn white or blue.  You have warmth and redness along your foot. This information is not intended to replace advice given to you by your health care provider. Make sure you discuss any questions you have with your health care provider. Document Released: 12/17/2015 Document Revised: 04/27/2016 Document Reviewed: 12/16/2014 Elsevier Interactive Patient Education  2019 [employer].

## 2019-02-08 ENCOUNTER — Encounter: Payer: Self-pay | Admitting: Podiatry

## 2019-02-08 NOTE — Progress Notes (Signed)
Subjective: Erin Carr presents today with painful, thick toenails 1-5 b/l that she cannot cut and which interfere with daily activities.  Pain is aggravated when wearing enclosed shoe gear.  Vincente Liberty, MD is her PCP.    Current Outpatient Medications:  .  albuterol (PROVENTIL HFA;VENTOLIN HFA) 108 (90 Base) MCG/ACT inhaler, Inhale 1 puff into the lungs every 4 (four) hours as needed for wheezing or shortness of breath., Disp: , Rfl:  .  amLODipine (NORVASC) 2.5 MG tablet, Take 2.5 mg by mouth daily., Disp: , Rfl: 3 .  amLODipine (NORVASC) 5 MG tablet, Take 5 mg by mouth daily., Disp: , Rfl:  .  aspirin EC 81 MG tablet, Take 81 mg by mouth daily., Disp: , Rfl:  .  atorvastatin (LIPITOR) 40 MG tablet, Take 1 tablet (40 mg total) by mouth daily at 6 PM., Disp: 30 tablet, Rfl: 2 .  cephALEXin (KEFLEX) 250 MG capsule, Take 1 capsule (250 mg total) by mouth 4 (four) times daily., Disp: 28 capsule, Rfl: 0 .  ergocalciferol (VITAMIN D2) 50000 units capsule, Take 50,000 Units by mouth once a week. On Monday, Disp: , Rfl:  .  ferrous sulfate 325 (65 FE) MG tablet, Take 1 tablet (325 mg total) by mouth daily. (Patient taking differently: Take 325 mg by mouth 3 (three) times a week. ), Disp: 30 tablet, Rfl: 0 .  furosemide (LASIX) 20 MG tablet, Take 3 tablets (60 mg total) 2 (two) times daily by mouth., Disp: 30 tablet, Rfl: 0 .  gabapentin (NEURONTIN) 400 MG capsule, Take 400 mg by mouth 2 (two) times daily., Disp: , Rfl:  .  glipiZIDE (GLUCOTROL) 5 MG tablet, Take 5 mg by mouth daily before breakfast., Disp: , Rfl:  .  lisinopril (PRINIVIL,ZESTRIL) 20 MG tablet, Take 20 mg by mouth 2 (two) times daily., Disp: , Rfl:  .  metFORMIN (GLUCOPHAGE-XR) 500 MG 24 hr tablet, Take 500 mg by mouth 2 (two) times daily., Disp: , Rfl:  .  metoprolol tartrate (LOPRESSOR) 25 MG tablet, Take 0.5 tablets (12.5 mg total) by mouth 2 (two) times daily. (Patient taking differently: Take 25 mg by mouth 2 (two)  times daily. ), Disp: 60 tablet, Rfl: 2 .  potassium chloride (K-DUR) 10 MEQ tablet, Take 10 mEq by mouth daily., Disp: , Rfl:   No Known Allergies  Objective:  Vascular Examination: Capillary refill time <3 seconds  x 10 digits  Dorsalis pedis and Posterior tibial pulses palpable b/l.  Digital hair absent  x 10 digits  Skin temperature gradient WNL b/l  Dermatological Examination: Skin with normal turgor, texture and tone b/l  Toenails 1-5 b/l discolored, thick, dystrophic with subungual debris and pain with palpation to nailbeds due to thickness of nails.  Musculoskeletal: Muscle strength 5/5 to all LE LLE.  Drop foot RLE.  Hammertoes 2-5 b/l.  No pain, crepitus or joint limitation noted with ROM.   Neurological: Sensation intact with 10 gram monofilament.  Vibratory sensation intact.  Assessment: Painful onychomycosis toenails 1-5 b/l   Plan: 1. Toenails 1-5 b/l were debrided in length and girth without iatrogenic bleeding. 2. Patient to continue soft, supportive shoe gear 3. Patient to report any pedal injuries to medical professional immediately. 4. Follow up 3 months.  5. Patient/POA to call should there be a concern in the interim.

## 2019-05-01 ENCOUNTER — Other Ambulatory Visit: Payer: Self-pay

## 2019-05-01 ENCOUNTER — Encounter: Payer: Self-pay | Admitting: Podiatry

## 2019-05-01 ENCOUNTER — Ambulatory Visit (INDEPENDENT_AMBULATORY_CARE_PROVIDER_SITE_OTHER): Payer: Medicare Other | Admitting: Podiatry

## 2019-05-01 VITALS — Temp 97.7°F

## 2019-05-01 DIAGNOSIS — B351 Tinea unguium: Secondary | ICD-10-CM | POA: Diagnosis not present

## 2019-05-01 DIAGNOSIS — E119 Type 2 diabetes mellitus without complications: Secondary | ICD-10-CM | POA: Diagnosis not present

## 2019-05-01 DIAGNOSIS — E1142 Type 2 diabetes mellitus with diabetic polyneuropathy: Secondary | ICD-10-CM | POA: Diagnosis not present

## 2019-05-01 DIAGNOSIS — M79674 Pain in right toe(s): Secondary | ICD-10-CM

## 2019-05-01 DIAGNOSIS — M79675 Pain in left toe(s): Secondary | ICD-10-CM | POA: Diagnosis not present

## 2019-05-01 NOTE — Patient Instructions (Addendum)
Diabetes Mellitus and Foot Care Foot care is an important part of your health, especially when you have diabetes. Diabetes may cause you to have problems because of poor blood flow (circulation) to your feet and legs, which can cause your skin to:  Become thinner and drier.  Break more easily.  Heal more slowly.  Peel and crack. You may also have nerve damage (neuropathy) in your legs and feet, causing decreased feeling in them. This means that you may not notice minor injuries to your feet that could lead to more serious problems. Noticing and addressing any potential problems early is the best way to prevent future foot problems. How to care for your feet Foot hygiene  Wash your feet daily with warm water and mild soap. Do not use hot water. Then, pat your feet and the areas between your toes until they are completely dry. Do not soak your feet as this can dry your skin.  Trim your toenails straight across. Do not dig under them or around the cuticle. File the edges of your nails with an emery board or nail file.  Apply a moisturizing lotion or petroleum jelly to the skin on your feet and to dry, brittle toenails. Use lotion that does not contain alcohol and is unscented. Do not apply lotion between your toes. Shoes and socks  Wear clean socks or stockings every day. Make sure they are not too tight. Do not wear knee-high stockings since they may decrease blood flow to your legs.  Wear shoes that fit properly and have enough cushioning. Always look in your shoes before you put them on to be sure there are no objects inside.  To break in new shoes, wear them for just a few hours a day. This prevents injuries on your feet. Wounds, scrapes, corns, and calluses  Check your feet daily for blisters, cuts, bruises, sores, and redness. If you cannot see the bottom of your feet, use a mirror or ask someone for help.  Do not cut corns or calluses or try to remove them with medicine.  If you  find a minor scrape, cut, or break in the skin on your feet, keep it and the skin around it clean and dry. You may clean these areas with mild soap and water. Do not clean the area with peroxide, alcohol, or iodine.  If you have a wound, scrape, corn, or callus on your foot, look at it several times a day to make sure it is healing and not infected. Check for: ? Redness, swelling, or pain. ? Fluid or blood. ? Warmth. ? Pus or a bad smell. General instructions  Do not cross your legs. This may decrease blood flow to your feet.  Do not use heating pads or hot water bottles on your feet. They may burn your skin. If you have lost feeling in your feet or legs, you may not know this is happening until it is too late.  Protect your feet from hot and cold by wearing shoes, such as at the beach or on hot pavement.  Schedule a complete foot exam at least once a year (annually) or more often if you have foot problems. If you have foot problems, report any cuts, sores, or bruises to your health care provider immediately. Contact a health care provider if:  You have a medical condition that increases your risk of infection and you have any cuts, sores, or bruises on your feet.  You have an injury that is not   healing.  You have redness on your legs or feet.  You feel burning or tingling in your legs or feet.  You have pain or cramps in your legs and feet.  Your legs or feet are numb.  Your feet always feel cold.  You have pain around a toenail. Get help right away if:  You have a wound, scrape, corn, or callus on your foot and: ? You have pain, swelling, or redness that gets worse. ? You have fluid or blood coming from the wound, scrape, corn, or callus. ? Your wound, scrape, corn, or callus feels warm to the touch. ? You have pus or a bad smell coming from the wound, scrape, corn, or callus. ? You have a fever. ? You have a red line going up your leg. Summary  Check your feet every day  for cuts, sores, red spots, swelling, and blisters.  Moisturize feet and legs daily.  Wear shoes that fit properly and have enough cushioning.  If you have foot problems, report any cuts, sores, or bruises to your health care provider immediately.  Schedule a complete foot exam at least once a year (annually) or more often if you have foot problems. This information is not intended to replace advice given to you by your health care provider. Make sure you discuss any questions you have with your health care provider. Document Released: 11/17/2000 Document Revised: 01/02/2018 Document Reviewed: 12/22/2016 Elsevier Interactive Patient Education  2019 [employer]  Onychomycosis/Fungal Toenails  WHAT IS IT? An infection that lies within the keratin of your nail plate that is caused by a fungus.  WHY ME? Fungal infections affect all ages, sexes, races, and creeds.  There may be many factors that predispose you to a fungal infection such as age, coexisting medical conditions such as diabetes, or an autoimmune disease; stress, medications, fatigue, genetics, etc.  Bottom line: fungus thrives in a warm, moist environment and your shoes offer such a location.  IS IT CONTAGIOUS? Theoretically, yes.  You do not want to share shoes, nail clippers or files with someone who has fungal toenails.  Walking around barefoot in the same room or sleeping in the same bed is unlikely to transfer the organism.  It is important to realize, however, that fungus can spread easily from one nail to the next on the same foot.  HOW DO WE TREAT THIS?  There are several ways to treat this condition.  Treatment may depend on many factors such as age, medications, pregnancy, liver and kidney conditions, etc.  It is best to ask your doctor which options are available to you.  1. No treatment.   Unlike many other medical concerns, you can live with this condition.  However for many people this can be a painful condition and  may lead to ingrown toenails or a bacterial infection.  It is recommended that you keep the nails cut short to help reduce the amount of fungal nail. 2. Topical treatment.  These range from herbal remedies to prescription strength nail lacquers.  About 40-50% effective, topicals require twice daily application for approximately 9 to 12 months or until an entirely new nail has grown out.  The most effective topicals are medical grade medications available through physicians offices. 3. Oral antifungal medications.  With an 80-90% cure rate, the most common oral medication requires 3 to 4 months of therapy and stays in your system for a year as the new nail grows out.  Oral antifungal medications do require   blood work to make sure it is a safe drug for you.  A liver function panel will be performed prior to starting the medication and after the first month of treatment.  It is important to have the blood work performed to avoid any harmful side effects.  In general, this medication safe but blood work is required. 4. Laser Therapy.  This treatment is performed by applying a specialized laser to the affected nail plate.  This therapy is noninvasive, fast, and non-painful.  It is not covered by insurance and is therefore, out of pocket.  The results have been very good with a 80-95% cure rate.  [facility] is the only practice in the area to offer this therapy. 5. Permanent Nail Avulsion.  Removing the entire nail so that a new nail will not grow back. 

## 2019-05-04 NOTE — Progress Notes (Signed)
Subjective: Erin Carr presents today for preventative diabetic foot care with painful, thick toenails 1-5 b/l that she cannot cut and which interfere with daily activities.  Pain is aggravated when wearing enclosed shoe gear and is relieved with periodic professional debridement.   Vincente Liberty, MD is her PCP and last visit was 3 months ago per patient recall.    Current Outpatient Medications:  .  albuterol (PROVENTIL HFA;VENTOLIN HFA) 108 (90 Base) MCG/ACT inhaler, Inhale 1 puff into the lungs every 4 (four) hours as needed for wheezing or shortness of breath., Disp: , Rfl:  .  amLODipine (NORVASC) 2.5 MG tablet, Take 2.5 mg by mouth daily., Disp: , Rfl: 3 .  amLODipine (NORVASC) 5 MG tablet, Take 5 mg by mouth daily., Disp: , Rfl:  .  aspirin EC 81 MG tablet, Take 81 mg by mouth daily., Disp: , Rfl:  .  atorvastatin (LIPITOR) 40 MG tablet, Take 1 tablet (40 mg total) by mouth daily at 6 PM., Disp: 30 tablet, Rfl: 2 .  cephALEXin (KEFLEX) 250 MG capsule, Take 1 capsule (250 mg total) by mouth 4 (four) times daily., Disp: 28 capsule, Rfl: 0 .  ergocalciferol (VITAMIN D2) 50000 units capsule, Take 50,000 Units by mouth once a week. On Monday, Disp: , Rfl:  .  ferrous sulfate 325 (65 FE) MG tablet, Take 1 tablet (325 mg total) by mouth daily. (Patient taking differently: Take 325 mg by mouth 3 (three) times a week. ), Disp: 30 tablet, Rfl: 0 .  furosemide (LASIX) 20 MG tablet, Take 3 tablets (60 mg total) 2 (two) times daily by mouth., Disp: 30 tablet, Rfl: 0 .  gabapentin (NEURONTIN) 400 MG capsule, Take 400 mg by mouth 2 (two) times daily., Disp: , Rfl:  .  glipiZIDE (GLUCOTROL) 5 MG tablet, Take 5 mg by mouth daily before breakfast., Disp: , Rfl:  .  lisinopril (PRINIVIL,ZESTRIL) 20 MG tablet, Take 20 mg by mouth 2 (two) times daily., Disp: , Rfl:  .  metFORMIN (GLUCOPHAGE-XR) 500 MG 24 hr tablet, Take 500 mg by mouth 2 (two) times daily., Disp: , Rfl:  .  metoprolol tartrate  (LOPRESSOR) 25 MG tablet, Take 0.5 tablets (12.5 mg total) by mouth 2 (two) times daily. (Patient taking differently: Take 25 mg by mouth 2 (two) times daily. ), Disp: 60 tablet, Rfl: 2 .  potassium chloride (K-DUR) 10 MEQ tablet, Take 10 mEq by mouth daily., Disp: , Rfl:   No Known Allergies  Objective: Vitals:   05/01/19 1035  Temp: 97.7 F (36.5 C)    Vascular Examination: Capillary refill time <3 seconds x 10 digits.  Dorsalis pedis and Posterior tibial pulses palpable b/l.  Digital hair absent x 10 digits.  Skin temperature gradient WNL b/l.  Dermatological Examination: Skin with normal turgor, texture and tone b/l.  Toenails 1-5 b/l discolored, thick, dystrophic with subungual debris and pain with palpation to nailbeds due to thickness of nails.  Evidence of subungual seroma left hallux with a small amount of serous drainage expressed with debridement of nailplate. No erythema, no edema, no drainage, no flocculence.  Musculoskeletal: Muscle strength 5/5 to all LE muscle groups of the LLE.  Drop foot RLE.   Hammertoes 2-5 b/l.  No gross bony deformities b/l.  No pain, crepitus or joint limitation noted with ROM.   Neurological: Sensation absent 0/5 b/l with 10 gram monofilament.  Vibratory sensation absent b/l LE.  Assessment: Painful onychomycosis toenails 1-5 b/l  Subungual seroma left hallux NIDDM  with neuropathy  Plan: 1. Diabetic foot examination performed on today. Continue diabetic foot care principles.  2. Toenails 1-5 b/l were debrided in length and girth without iatrogenic bleeding. Subungual seroma evacuted left hallux and light bleeding addressed with Lumicain. Antibiotic ointment and bandaid applied. Patient instructed to apply antibiotic ointment to digit once daily for one week. 3. Patient to continue soft, supportive shoe gear daily. 4. Patient to report any pedal injuries to medical professional immediately. 5. Follow up 3 months.   6. Patient/POA to call should there be a concern in the interim.

## 2019-05-13 DIAGNOSIS — J4520 Mild intermittent asthma, uncomplicated: Secondary | ICD-10-CM | POA: Diagnosis not present

## 2019-05-13 DIAGNOSIS — E7800 Pure hypercholesterolemia, unspecified: Secondary | ICD-10-CM | POA: Diagnosis not present

## 2019-05-13 DIAGNOSIS — J449 Chronic obstructive pulmonary disease, unspecified: Secondary | ICD-10-CM | POA: Diagnosis not present

## 2019-05-23 ENCOUNTER — Ambulatory Visit (HOSPITAL_COMMUNITY)
Admission: RE | Admit: 2019-05-23 | Discharge: 2019-05-23 | Disposition: A | Discharge status: Home / Self Care | Payer: Medicare Other | Source: Ambulatory Visit | Attending: Internal Medicine | Admitting: Internal Medicine

## 2019-05-23 ENCOUNTER — Other Ambulatory Visit: Payer: Self-pay

## 2019-05-23 ENCOUNTER — Inpatient Hospital Stay: Payer: Medicare Other | Attending: Internal Medicine

## 2019-05-23 DIAGNOSIS — I10 Essential (primary) hypertension: Secondary | ICD-10-CM | POA: Insufficient documentation

## 2019-05-23 DIAGNOSIS — C3490 Malignant neoplasm of unspecified part of unspecified bronchus or lung: Secondary | ICD-10-CM | POA: Insufficient documentation

## 2019-05-23 DIAGNOSIS — C3411 Malignant neoplasm of upper lobe, right bronchus or lung: Secondary | ICD-10-CM | POA: Insufficient documentation

## 2019-05-23 DIAGNOSIS — R918 Other nonspecific abnormal finding of lung field: Secondary | ICD-10-CM | POA: Diagnosis not present

## 2019-05-23 DIAGNOSIS — R0609 Other forms of dyspnea: Secondary | ICD-10-CM | POA: Insufficient documentation

## 2019-05-23 DIAGNOSIS — J8410 Pulmonary fibrosis, unspecified: Secondary | ICD-10-CM | POA: Diagnosis not present

## 2019-05-23 LAB — CBC WITH DIFFERENTIAL (CANCER CENTER ONLY)
Abs Immature Granulocytes: 0.03 10*3/uL (ref 0.00–0.07)
Basophils Absolute: 0 10*3/uL (ref 0.0–0.1)
Basophils Relative: 0 %
Eosinophils Absolute: 0.5 10*3/uL (ref 0.0–0.5)
Eosinophils Relative: 5 %
HCT: 40.7 % (ref 36.0–46.0)
Hemoglobin: 12.5 g/dL (ref 12.0–15.0)
Immature Granulocytes: 0 %
Lymphocytes Relative: 19 %
Lymphs Abs: 1.7 10*3/uL (ref 0.7–4.0)
MCH: 27.3 pg (ref 26.0–34.0)
MCHC: 30.7 g/dL (ref 30.0–36.0)
MCV: 88.9 fL (ref 80.0–100.0)
Monocytes Absolute: 1.2 10*3/uL — ABNORMAL HIGH (ref 0.1–1.0)
Monocytes Relative: 13 %
Neutro Abs: 5.7 10*3/uL (ref 1.7–7.7)
Neutrophils Relative %: 63 %
Platelet Count: 228 10*3/uL (ref 150–400)
RBC: 4.58 MIL/uL (ref 3.87–5.11)
RDW: 12.9 % (ref 11.5–15.5)
WBC Count: 9.1 10*3/uL (ref 4.0–10.5)
nRBC: 0 % (ref 0.0–0.2)

## 2019-05-23 LAB — CMP (CANCER CENTER ONLY)
ALT: 15 U/L (ref 0–44)
AST: 16 U/L (ref 15–41)
Albumin: 3.9 g/dL (ref 3.5–5.0)
Alkaline Phosphatase: 109 U/L (ref 38–126)
Anion gap: 10 (ref 5–15)
BUN: 17 mg/dL (ref 8–23)
CO2: 26 mmol/L (ref 22–32)
Calcium: 10 mg/dL (ref 8.9–10.3)
Chloride: 108 mmol/L (ref 98–111)
Creatinine: 1.28 mg/dL — ABNORMAL HIGH (ref 0.44–1.00)
GFR, Est AFR Am: 44 mL/min — ABNORMAL LOW (ref >60)
GFR, Estimated: 38 mL/min — ABNORMAL LOW (ref >60)
Glucose, Bld: 126 mg/dL — ABNORMAL HIGH (ref 70–99)
Potassium: 3.4 mmol/L — ABNORMAL LOW (ref 3.5–5.1)
Sodium: 144 mmol/L (ref 135–145)
Total Bilirubin: 0.2 mg/dL — ABNORMAL LOW (ref 0.3–1.2)
Total Protein: 7.2 g/dL (ref 6.5–8.1)

## 2019-05-26 ENCOUNTER — Other Ambulatory Visit: Payer: Self-pay

## 2019-05-26 ENCOUNTER — Inpatient Hospital Stay (HOSPITAL_BASED_OUTPATIENT_CLINIC_OR_DEPARTMENT_OTHER): Payer: Medicare Other | Admitting: Internal Medicine

## 2019-05-26 ENCOUNTER — Encounter: Payer: Self-pay | Admitting: Internal Medicine

## 2019-05-26 VITALS — BP 178/61 | HR 68 | Temp 98.9°F | Resp 22 | Ht 62.0 in | Wt 110.7 lb

## 2019-05-26 DIAGNOSIS — C3490 Malignant neoplasm of unspecified part of unspecified bronchus or lung: Secondary | ICD-10-CM

## 2019-05-26 DIAGNOSIS — C3491 Malignant neoplasm of unspecified part of right bronchus or lung: Secondary | ICD-10-CM

## 2019-05-26 DIAGNOSIS — R0609 Other forms of dyspnea: Secondary | ICD-10-CM | POA: Diagnosis not present

## 2019-05-26 DIAGNOSIS — I10 Essential (primary) hypertension: Secondary | ICD-10-CM

## 2019-05-26 DIAGNOSIS — C3411 Malignant neoplasm of upper lobe, right bronchus or lung: Secondary | ICD-10-CM

## 2019-05-26 NOTE — Progress Notes (Signed)
[name] [phone]   [phone]  OFFICE PROGRESS NOTE  Vincente Liberty, MD [place] [address]  DIAGNOSIS: Stage IA (T1b, N0, M0) non-small cell lung cancer, adenocarcinoma presented with right upper lobe lung nodule diagnosed in November 2017.  PRIOR THERAPY: Curative stereotactic body radiotherapy to the right upper lobe lung nodule under the care of Dr. Sondra Come completed on 12/27/2016.  CURRENT THERAPY: Observation.  INTERVAL HISTORY: Erin Carr 85 y.o. female returns to the clinic today for 28-month follow-up visit.  The patient is feeling fine today with no concerning complaints.  She denied having any chest pain but has shortness of breath with exertion with no cough or hemoptysis.  She denied having any fever or chills.  She has no nausea, vomiting, diarrhea or constipation.  His blood pressure is not very well controlled.  She is currently on multiple blood pressure medication.  The patient had repeat CT scan of the chest performed recently and she is here for evaluation and discussion of her scan results.   MEDICAL HISTORY: Past Medical History:  Diagnosis Date   Adenocarcinoma of right lung, stage 1 ([facility]) 11/16/2016   Arthritis    Asthma    COPD (chronic obstructive pulmonary disease) (HCC)    Diabetes mellitus without complication ([facility])    Type II   Family history of adverse reaction to anesthesia    "aunt had PONV"   History of radiation therapy 12/19/16-12/27/16   SBRT to right upper lobe 54 Gy in 3 fractions   Hypercholesteremia    Hypertension    Osteoporosis    Positive PPD 12/16   PER PCP   Shortness of breath dyspnea    Tremors of nervous system    Left hand   Urinary frequency    Vitamin D deficiency    Wears glasses     ALLERGIES:  has No Known Allergies.  MEDICATIONS:  Current Outpatient Medications  Medication Sig Dispense Refill   albuterol (PROVENTIL  HFA;VENTOLIN HFA) 108 (90 Base) MCG/ACT inhaler Inhale 1 puff into the lungs every 4 (four) hours as needed for wheezing or shortness of breath.     amLODipine (NORVASC) 2.5 MG tablet Take 2.5 mg by mouth daily.  3   aspirin EC 81 MG tablet Take 81 mg by mouth daily.     atorvastatin (LIPITOR) 40 MG tablet Take 1 tablet (40 mg total) by mouth daily at 6 PM. 30 tablet 2   ergocalciferol (VITAMIN D2) 50000 units capsule Take 50,000 Units by mouth once a week. On Monday     ferrous sulfate 325 (65 FE) MG tablet Take 1 tablet (325 mg total) by mouth daily. (Patient taking differently: Take 325 mg by mouth 3 (three) times a week. ) 30 tablet 0   furosemide (LASIX) 20 MG tablet Take 3 tablets (60 mg total) 2 (two) times daily by mouth. 30 tablet 0   gabapentin (NEURONTIN) 400 MG capsule Take 400 mg by mouth 2 (two) times daily.     glipiZIDE (GLUCOTROL) 5 MG tablet Take 5 mg by mouth daily before breakfast.     lisinopril (PRINIVIL,ZESTRIL) 20 MG tablet Take 20 mg by mouth 2 (two) times daily.     metFORMIN (GLUCOPHAGE-XR) 500 MG 24 hr tablet Take 500 mg by mouth 2 (two) times daily.     metoprolol tartrate (LOPRESSOR) 25 MG tablet Take 0.5 tablets (12.5 mg total) by mouth 2 (two) times daily. (Patient taking  differently: Take 25 mg by mouth 2 (two) times daily. ) 60 tablet 2   potassium chloride (K-DUR) 10 MEQ tablet Take 10 mEq by mouth daily.     No current facility-administered medications for this visit.     SURGICAL HISTORY:  Past Surgical History:  Procedure Laterality Date   ABDOMINAL HYSTERECTOMY     ANTERIOR AND POSTERIOR REPAIR N/A 12/16/2014   Procedure: ANTERIOR (CYSTOCELE) AND POSTERIOR REPAIR (RECTOCELE);  Surgeon: Crawford Givens, MD;  Location: [facility];  Service: Gynecology;  Laterality: N/A;   APPENDECTOMY     BOWEL RESECTION     BREAST BIOPSY Left    COLONOSCOPY     VIDEO BRONCHOSCOPY WITH ENDOBRONCHIAL ULTRASOUND N/A 12/17/2015   Procedure: VIDEO BRONCHOSCOPY  WITH ENDOBRONCHIAL ULTRASOUND;  Surgeon: Ivin Poot, MD;  Location: [facility];  Service: Thoracic;  Laterality: N/A;    REVIEW OF SYSTEMS:  A comprehensive review of systems was negative except for: Respiratory: positive for dyspnea on exertion   PHYSICAL EXAMINATION: General appearance: alert, cooperative and no distress Head: Normocephalic, without obvious abnormality, atraumatic Neck: no adenopathy, no JVD, supple, symmetrical, trachea midline and thyroid not enlarged, symmetric, no tenderness/mass/nodules Lymph nodes: Cervical, supraclavicular, and axillary nodes normal. Resp: clear to auscultation bilaterally Back: symmetric, no curvature. ROM normal. No CVA tenderness. Cardio: regular rate and rhythm, S1, S2 normal, no murmur, click, rub or gallop GI: soft, non-tender; bowel sounds normal; no masses,  no organomegaly Extremities: extremities normal, atraumatic, no cyanosis or edema  ECOG PERFORMANCE STATUS: 1 - Symptomatic but completely ambulatory  Blood pressure (!) 178/61, pulse 68, temperature 98.9 F (37.2 C), temperature source Oral, resp. rate (!) 22, height 5\' 2"  (1.575 m), weight 110 lb 11.2 oz (50.2 kg), SpO2 100 %.  LABORATORY DATA: Lab Results  Component Value Date   WBC 9.1 05/23/2019   HGB 12.5 05/23/2019   HCT 40.7 05/23/2019   MCV 88.9 05/23/2019   PLT 228 05/23/2019      Chemistry      Component Value Date/Time   NA 144 05/23/2019 1100   NA 133 (L) 08/01/2017 1005   K 3.4 (L) 05/23/2019 1100   K 4.1 08/01/2017 1005   CL 108 05/23/2019 1100   CO2 26 05/23/2019 1100   CO2 27 08/01/2017 1005   BUN 17 05/23/2019 1100   BUN 10.4 08/01/2017 1005   CREATININE 1.28 (H) 05/23/2019 1100   CREATININE 0.7 08/01/2017 1005      Component Value Date/Time   CALCIUM 10.0 05/23/2019 1100   CALCIUM 9.1 08/01/2017 1005   ALKPHOS 109 05/23/2019 1100   ALKPHOS 77 08/01/2017 1005   AST 16 05/23/2019 1100   AST 51 (H) 08/01/2017 1005   ALT 15 05/23/2019 1100   ALT  63 (H) 08/01/2017 1005   BILITOT 0.2 (L) 05/23/2019 1100   BILITOT 0.40 08/01/2017 1005       RADIOGRAPHIC STUDIES: Ct Chest Wo Contrast  Result Date: 05/23/2019 CLINICAL DATA:  Stage IA right upper lobe lung adenocarcinoma diagnosed November 2017 status post radiation therapy completed 12/27/2016. Restaging. EXAM: CT CHEST WITHOUT CONTRAST TECHNIQUE: Multidetector CT imaging of the chest was performed following the standard protocol without IV contrast. COMPARISON:  11/08/2018 chest CT. FINDINGS: Cardiovascular: Normal heart size. No significant pericardial effusion/thickening. Three-vessel coronary atherosclerosis. Atherosclerotic nonaneurysmal thoracic aorta. Normal caliber pulmonary arteries. Mediastinum/Nodes: Stable mild multinodular goiter with dominant 2.4 cm hypodense left thyroid lobe nodule. Unremarkable esophagus. No pathologically enlarged axillary, mediastinal or hilar lymph nodes, noting limited sensitivity  for the detection of hilar adenopathy on this noncontrast study. Lungs/Pleura: No pneumothorax. No pleural effusion. Masslike mid to upper right perihilar lung fibrosis measures 4 1 x 2.8 cm (series 5/image 68), previously 4.2 x 2.9 cm, not appreciably changed. No acute consolidative airspace disease. A few scattered tiny solid pulmonary nodules in both lungs, largest 4 mm in the medial left lower lobe (series 5/image 101), all stable. No new significant pulmonary nodules. Upper abdomen: Small hiatal hernia. Simple partially visualized 5.4 cm posterior upper right renal cyst. Exophytic 1.2 cm lateral interpolar left renal cyst. Stable granulomatous liver calcification. Musculoskeletal: No aggressive appearing focal osseous lesions. Low-attenuation superficial 1.3 cm medial ventral right chest wall lesion is stable (series 2/image 103). Marked thoracic spondylosis. Healed bilateral proximal humeral deformities. IMPRESSION: 1. Stable masslike right perihilar radiation fibrosis, with no  evidence of local tumor recurrence. 2. No findings suspicious for metastatic disease in the chest. Aortic Atherosclerosis (ICD10-I70.0). Electronically Signed   By: Ilona Sorrel M.D.   On: 05/23/2019 14:04     ASSESSMENT AND PLAN: This is a very pleasant 85 years old stage IA non-small cell lung cancer presented with right upper lobe pulmonary nodule status post curative stereotactic body radiotherapy. The patient is currently on observation and she is feeling fine with no concerning complaints. She had repeat CT scan of the chest performed recently.  I personally and independently reviewed the scans and discussed the result with the patient today.  Her scan showed no concerning findings for disease progression. I recommended for the patient to continue on observation with repeat CT scan of the chest in 6 months. For hypertension she was advised to take her blood pressure medication as prescribed and to reconsult with her primary care physician for adjustment of her medications. The patient was advised to call immediately if she has any concerning symptoms in the interval. The patient voices understanding of current disease status and treatment options and is in agreement with the current care plan. All questions were answered. The patient knows to call the clinic with any problems, questions or concerns. We can certainly see the patient much sooner if necessary.  Disclaimer: This note was dictated with voice recognition software. Similar sounding words can inadvertently be transcribed and may not be corrected upon review.

## 2019-05-27 ENCOUNTER — Telehealth: Payer: Self-pay | Admitting: Internal Medicine

## 2019-05-27 NOTE — Telephone Encounter (Signed)
NO 6/22 los.

## 2019-06-12 DIAGNOSIS — I509 Heart failure, unspecified: Secondary | ICD-10-CM | POA: Diagnosis not present

## 2019-06-12 DIAGNOSIS — F17290 Nicotine dependence, other tobacco product, uncomplicated: Secondary | ICD-10-CM | POA: Diagnosis not present

## 2019-06-12 DIAGNOSIS — E785 Hyperlipidemia, unspecified: Secondary | ICD-10-CM | POA: Diagnosis not present

## 2019-06-12 DIAGNOSIS — I252 Old myocardial infarction: Secondary | ICD-10-CM | POA: Diagnosis not present

## 2019-06-12 DIAGNOSIS — I10 Essential (primary) hypertension: Secondary | ICD-10-CM | POA: Diagnosis not present

## 2019-06-12 DIAGNOSIS — E119 Type 2 diabetes mellitus without complications: Secondary | ICD-10-CM | POA: Diagnosis not present

## 2019-06-12 DIAGNOSIS — D649 Anemia, unspecified: Secondary | ICD-10-CM | POA: Diagnosis not present

## 2019-06-12 DIAGNOSIS — E559 Vitamin D deficiency, unspecified: Secondary | ICD-10-CM | POA: Diagnosis not present

## 2019-06-12 DIAGNOSIS — I25118 Atherosclerotic heart disease of native coronary artery with other forms of angina pectoris: Secondary | ICD-10-CM | POA: Diagnosis not present

## 2019-06-17 DIAGNOSIS — J441 Chronic obstructive pulmonary disease with (acute) exacerbation: Secondary | ICD-10-CM | POA: Diagnosis not present

## 2019-06-17 DIAGNOSIS — I739 Peripheral vascular disease, unspecified: Secondary | ICD-10-CM | POA: Diagnosis not present

## 2019-06-17 DIAGNOSIS — D638 Anemia in other chronic diseases classified elsewhere: Secondary | ICD-10-CM | POA: Diagnosis not present

## 2019-06-17 DIAGNOSIS — E1165 Type 2 diabetes mellitus with hyperglycemia: Secondary | ICD-10-CM | POA: Diagnosis not present

## 2019-06-17 DIAGNOSIS — C3490 Malignant neoplasm of unspecified part of unspecified bronchus or lung: Secondary | ICD-10-CM | POA: Diagnosis not present

## 2019-06-17 DIAGNOSIS — I119 Hypertensive heart disease without heart failure: Secondary | ICD-10-CM | POA: Diagnosis not present

## 2019-06-17 DIAGNOSIS — E7800 Pure hypercholesterolemia, unspecified: Secondary | ICD-10-CM | POA: Diagnosis not present

## 2019-06-17 DIAGNOSIS — E113599 Type 2 diabetes mellitus with proliferative diabetic retinopathy without macular edema, unspecified eye: Secondary | ICD-10-CM | POA: Diagnosis not present

## 2019-06-17 DIAGNOSIS — E559 Vitamin D deficiency, unspecified: Secondary | ICD-10-CM | POA: Diagnosis not present

## 2019-06-17 DIAGNOSIS — J4521 Mild intermittent asthma with (acute) exacerbation: Secondary | ICD-10-CM | POA: Diagnosis not present

## 2019-06-17 DIAGNOSIS — Z79899 Other long term (current) drug therapy: Secondary | ICD-10-CM | POA: Diagnosis not present

## 2019-06-24 ENCOUNTER — Ambulatory Visit: Payer: Medicare Other | Admitting: Orthotics

## 2019-06-24 ENCOUNTER — Other Ambulatory Visit: Payer: Self-pay

## 2019-06-24 DIAGNOSIS — E119 Type 2 diabetes mellitus without complications: Secondary | ICD-10-CM

## 2019-06-24 DIAGNOSIS — B351 Tinea unguium: Secondary | ICD-10-CM

## 2019-06-24 DIAGNOSIS — E1142 Type 2 diabetes mellitus with diabetic polyneuropathy: Secondary | ICD-10-CM

## 2019-06-24 DIAGNOSIS — M21371 Foot drop, right foot: Secondary | ICD-10-CM

## 2019-06-24 NOTE — Progress Notes (Signed)
Patient was measured for med necessity extra depth shoes (x2) w/ 3 pair OTS inserts (due to her wearing brace on RT foot). Patient was measured w/ brannock to determine size and width.  Foam impression mold was achieved and deemed appropriate for fabrication of  cmfo.   See [facility] chart notes for further documentation and dx codes for determination of medical necessity.  Appropriate forms will be sent to PCP to verify and sign off on medical necessity.

## 2019-08-04 ENCOUNTER — Ambulatory Visit: Payer: Medicare Other | Admitting: Podiatry

## 2019-08-18 ENCOUNTER — Telehealth: Payer: Self-pay | Admitting: Podiatry

## 2019-08-18 NOTE — Telephone Encounter (Signed)
pts family member left a message asking status of pts diabetic shoes.  I have checked and spoke to the family member and notified that we have not received the paperwork from Dr Katherine Roan yet.

## 2019-09-11 DIAGNOSIS — E119 Type 2 diabetes mellitus without complications: Secondary | ICD-10-CM | POA: Diagnosis not present

## 2019-09-11 DIAGNOSIS — I10 Essential (primary) hypertension: Secondary | ICD-10-CM | POA: Diagnosis not present

## 2019-09-11 DIAGNOSIS — I509 Heart failure, unspecified: Secondary | ICD-10-CM | POA: Diagnosis not present

## 2019-09-11 DIAGNOSIS — D649 Anemia, unspecified: Secondary | ICD-10-CM | POA: Diagnosis not present

## 2019-09-11 DIAGNOSIS — E785 Hyperlipidemia, unspecified: Secondary | ICD-10-CM | POA: Diagnosis not present

## 2019-09-11 DIAGNOSIS — F17290 Nicotine dependence, other tobacco product, uncomplicated: Secondary | ICD-10-CM | POA: Diagnosis not present

## 2019-09-11 DIAGNOSIS — E559 Vitamin D deficiency, unspecified: Secondary | ICD-10-CM | POA: Diagnosis not present

## 2019-09-11 DIAGNOSIS — I25119 Atherosclerotic heart disease of native coronary artery with unspecified angina pectoris: Secondary | ICD-10-CM | POA: Diagnosis not present

## 2019-09-16 DIAGNOSIS — E7800 Pure hypercholesterolemia, unspecified: Secondary | ICD-10-CM | POA: Diagnosis not present

## 2019-09-16 DIAGNOSIS — I119 Hypertensive heart disease without heart failure: Secondary | ICD-10-CM | POA: Diagnosis not present

## 2019-09-16 DIAGNOSIS — E559 Vitamin D deficiency, unspecified: Secondary | ICD-10-CM | POA: Diagnosis not present

## 2019-09-16 DIAGNOSIS — I739 Peripheral vascular disease, unspecified: Secondary | ICD-10-CM | POA: Diagnosis not present

## 2019-09-16 DIAGNOSIS — D638 Anemia in other chronic diseases classified elsewhere: Secondary | ICD-10-CM | POA: Diagnosis not present

## 2019-09-16 DIAGNOSIS — J441 Chronic obstructive pulmonary disease with (acute) exacerbation: Secondary | ICD-10-CM | POA: Diagnosis not present

## 2019-09-16 DIAGNOSIS — C3490 Malignant neoplasm of unspecified part of unspecified bronchus or lung: Secondary | ICD-10-CM | POA: Diagnosis not present

## 2019-09-16 DIAGNOSIS — J4521 Mild intermittent asthma with (acute) exacerbation: Secondary | ICD-10-CM | POA: Diagnosis not present

## 2019-09-16 DIAGNOSIS — E113299 Type 2 diabetes mellitus with mild nonproliferative diabetic retinopathy without macular edema, unspecified eye: Secondary | ICD-10-CM | POA: Diagnosis not present

## 2019-09-16 DIAGNOSIS — Z79899 Other long term (current) drug therapy: Secondary | ICD-10-CM | POA: Diagnosis not present

## 2019-09-16 DIAGNOSIS — E1165 Type 2 diabetes mellitus with hyperglycemia: Secondary | ICD-10-CM | POA: Diagnosis not present

## 2019-10-08 ENCOUNTER — Ambulatory Visit: Payer: Medicare Other | Admitting: Orthotics

## 2019-10-08 ENCOUNTER — Other Ambulatory Visit: Payer: Self-pay

## 2019-10-08 DIAGNOSIS — E1140 Type 2 diabetes mellitus with diabetic neuropathy, unspecified: Secondary | ICD-10-CM | POA: Diagnosis not present

## 2019-10-08 DIAGNOSIS — M2042 Other hammer toe(s) (acquired), left foot: Secondary | ICD-10-CM

## 2019-10-08 DIAGNOSIS — M2041 Other hammer toe(s) (acquired), right foot: Secondary | ICD-10-CM

## 2019-10-08 DIAGNOSIS — M216X9 Other acquired deformities of unspecified foot: Secondary | ICD-10-CM

## 2019-10-10 DIAGNOSIS — E119 Type 2 diabetes mellitus without complications: Secondary | ICD-10-CM | POA: Diagnosis not present

## 2019-10-10 DIAGNOSIS — I509 Heart failure, unspecified: Secondary | ICD-10-CM | POA: Diagnosis not present

## 2019-10-10 DIAGNOSIS — D649 Anemia, unspecified: Secondary | ICD-10-CM | POA: Diagnosis not present

## 2019-10-10 DIAGNOSIS — F17290 Nicotine dependence, other tobacco product, uncomplicated: Secondary | ICD-10-CM | POA: Diagnosis not present

## 2019-10-10 DIAGNOSIS — I10 Essential (primary) hypertension: Secondary | ICD-10-CM | POA: Diagnosis not present

## 2019-10-10 DIAGNOSIS — E785 Hyperlipidemia, unspecified: Secondary | ICD-10-CM | POA: Diagnosis not present

## 2019-10-10 DIAGNOSIS — I25118 Atherosclerotic heart disease of native coronary artery with other forms of angina pectoris: Secondary | ICD-10-CM | POA: Diagnosis not present

## 2019-10-10 DIAGNOSIS — E559 Vitamin D deficiency, unspecified: Secondary | ICD-10-CM | POA: Diagnosis not present

## 2019-10-24 ENCOUNTER — Telehealth: Payer: Self-pay | Admitting: Internal Medicine

## 2019-10-24 ENCOUNTER — Telehealth: Payer: Self-pay

## 2019-10-24 NOTE — Telephone Encounter (Signed)
Scheduled appt per 11/20 sch message - pt is aware of appt date and time

## 2019-10-24 NOTE — Telephone Encounter (Signed)
Erin Erin Carr's niece, Erin Carr called stating that Erin Carr's CT scan had not been scheduled yet.  It is schedule for 11/21/2019 at 1430.  She has a lab appointment on 11/21/2019 at 1245.  A scheduling message was sent to make an appointment with Dr. Julien Nordmann the week of 11/24/2019.  I spoke to Erin Carr and relayed this information.  She verbalized understanding.

## 2019-11-21 ENCOUNTER — Encounter (HOSPITAL_COMMUNITY): Payer: Self-pay

## 2019-11-21 ENCOUNTER — Other Ambulatory Visit: Payer: Self-pay

## 2019-11-21 ENCOUNTER — Inpatient Hospital Stay: Payer: Medicare Other | Attending: Internal Medicine

## 2019-11-21 ENCOUNTER — Ambulatory Visit (HOSPITAL_COMMUNITY)
Admission: RE | Admit: 2019-11-21 | Discharge: 2019-11-21 | Disposition: A | Discharge status: Home / Self Care | Payer: Medicare Other | Source: Ambulatory Visit | Attending: Internal Medicine | Admitting: Internal Medicine

## 2019-11-21 DIAGNOSIS — C3411 Malignant neoplasm of upper lobe, right bronchus or lung: Secondary | ICD-10-CM | POA: Insufficient documentation

## 2019-11-21 DIAGNOSIS — C3490 Malignant neoplasm of unspecified part of unspecified bronchus or lung: Secondary | ICD-10-CM

## 2019-11-21 DIAGNOSIS — R05 Cough: Secondary | ICD-10-CM | POA: Diagnosis not present

## 2019-11-21 LAB — CBC WITH DIFFERENTIAL (CANCER CENTER ONLY)
Abs Immature Granulocytes: 0.01 10*3/uL (ref 0.00–0.07)
Basophils Absolute: 0 10*3/uL (ref 0.0–0.1)
Basophils Relative: 1 %
Eosinophils Absolute: 0.4 10*3/uL (ref 0.0–0.5)
Eosinophils Relative: 5 %
HCT: 42.3 % (ref 36.0–46.0)
Hemoglobin: 12.9 g/dL (ref 12.0–15.0)
Immature Granulocytes: 0 %
Lymphocytes Relative: 21 %
Lymphs Abs: 1.7 10*3/uL (ref 0.7–4.0)
MCH: 27.2 pg (ref 26.0–34.0)
MCHC: 30.5 g/dL (ref 30.0–36.0)
MCV: 89.1 fL (ref 80.0–100.0)
Monocytes Absolute: 0.9 10*3/uL (ref 0.1–1.0)
Monocytes Relative: 11 %
Neutro Abs: 4.8 10*3/uL (ref 1.7–7.7)
Neutrophils Relative %: 62 %
Platelet Count: 203 10*3/uL (ref 150–400)
RBC: 4.75 MIL/uL (ref 3.87–5.11)
RDW: 13.6 % (ref 11.5–15.5)
WBC Count: 7.7 10*3/uL (ref 4.0–10.5)
nRBC: 0 % (ref 0.0–0.2)

## 2019-11-21 LAB — CMP (CANCER CENTER ONLY)
ALT: 10 U/L (ref 0–44)
AST: 19 U/L (ref 15–41)
Albumin: 4.4 g/dL (ref 3.5–5.0)
Alkaline Phosphatase: 115 U/L (ref 38–126)
Anion gap: 10 (ref 5–15)
BUN: 20 mg/dL (ref 8–23)
CO2: 31 mmol/L (ref 22–32)
Calcium: 10 mg/dL (ref 8.9–10.3)
Chloride: 103 mmol/L (ref 98–111)
Creatinine: 1.27 mg/dL — ABNORMAL HIGH (ref 0.44–1.00)
GFR, Est AFR Am: 44 mL/min — ABNORMAL LOW (ref >60)
GFR, Estimated: 38 mL/min — ABNORMAL LOW (ref >60)
Glucose, Bld: 88 mg/dL (ref 70–99)
Potassium: 3.7 mmol/L (ref 3.5–5.1)
Sodium: 144 mmol/L (ref 135–145)
Total Bilirubin: 0.4 mg/dL (ref 0.3–1.2)
Total Protein: 7.8 g/dL (ref 6.5–8.1)

## 2019-11-25 ENCOUNTER — Encounter: Payer: Self-pay | Admitting: Internal Medicine

## 2019-11-25 ENCOUNTER — Inpatient Hospital Stay (HOSPITAL_BASED_OUTPATIENT_CLINIC_OR_DEPARTMENT_OTHER): Payer: Medicare Other | Admitting: Internal Medicine

## 2019-11-25 ENCOUNTER — Other Ambulatory Visit: Payer: Self-pay

## 2019-11-25 VITALS — BP 155/62 | HR 69 | Temp 98.2°F | Resp 17 | Ht 62.0 in | Wt 118.4 lb

## 2019-11-25 DIAGNOSIS — C3491 Malignant neoplasm of unspecified part of right bronchus or lung: Secondary | ICD-10-CM

## 2019-11-25 DIAGNOSIS — C3490 Malignant neoplasm of unspecified part of unspecified bronchus or lung: Secondary | ICD-10-CM | POA: Diagnosis not present

## 2019-11-25 DIAGNOSIS — C3411 Malignant neoplasm of upper lobe, right bronchus or lung: Secondary | ICD-10-CM | POA: Diagnosis not present

## 2019-11-25 DIAGNOSIS — I10 Essential (primary) hypertension: Secondary | ICD-10-CM

## 2019-11-25 NOTE — Progress Notes (Signed)
[place] [phone]   [phone]  OFFICE PROGRESS NOTE  Vincente Liberty, MD [place] [address]  DIAGNOSIS: Stage IA (T1b, N0, M0) non-small cell lung cancer, adenocarcinoma presented with right upper lobe lung nodule diagnosed in November 2017.  PRIOR THERAPY: Curative stereotactic body radiotherapy to the right upper lobe lung nodule under the care of Dr. Sondra Come completed on 12/27/2016.  CURRENT THERAPY: Observation.  INTERVAL HISTORY: Erin Carr 85 y.o. female returns to the clinic today for follow-up visit.  The patient is feeling fine today with no concerning complaints except for mild cough.  She denied having any chest pain, shortness of breath or hemoptysis.  She denied having any weight loss or night sweats.  She has no nausea, vomiting, diarrhea or constipation.  She has no headache or visual changes.  She had repeat CT scan of the chest performed recently and she is here for evaluation and discussion of her risk her results.  MEDICAL HISTORY: Past Medical History:  Diagnosis Date  . Adenocarcinoma of right lung, stage 1 ([facility]) 11/16/2016  . Arthritis   . Asthma   . COPD (chronic obstructive pulmonary disease) ([facility])   . Diabetes mellitus without complication ([place])    Type II  . Family history of adverse reaction to anesthesia    "aunt had PONV"  . History of radiation therapy 12/19/16-12/27/16   SBRT to right upper lobe 54 Gy in 3 fractions  . Hypercholesteremia   . Hypertension   . Osteoporosis   . Positive PPD 12/16   PER PCP  . Shortness of breath dyspnea   . Tremors of nervous system    Left hand  . Urinary frequency   . Vitamin D deficiency   . Wears glasses     ALLERGIES:  has No Known Allergies.  MEDICATIONS:  Current Outpatient Medications  Medication Sig Dispense Refill  . albuterol (PROVENTIL HFA;VENTOLIN HFA) 108 (90 Base) MCG/ACT inhaler Inhale 1 puff into the lungs every 4  (four) hours as needed for wheezing or shortness of breath.    Marland Kitchen amLODipine (NORVASC) 2.5 MG tablet Take 2.5 mg by mouth daily.  3  . amLODipine (NORVASC) 5 MG tablet Take 5 mg by mouth daily.    Marland Kitchen aspirin EC 81 MG tablet Take 81 mg by mouth daily.    Marland Kitchen atorvastatin (LIPITOR) 40 MG tablet Take 1 tablet (40 mg total) by mouth daily at 6 PM. 30 tablet 2  . ergocalciferol (VITAMIN D2) 50000 units capsule Take 50,000 Units by mouth once a week. On Monday    . ferrous sulfate 325 (65 FE) MG tablet Take 1 tablet (325 mg total) by mouth daily. (Patient taking differently: Take 325 mg by mouth 3 (three) times a week. ) 30 tablet 0  . furosemide (LASIX) 20 MG tablet Take 3 tablets (60 mg total) 2 (two) times daily by mouth. 30 tablet 0  . gabapentin (NEURONTIN) 400 MG capsule Take 400 mg by mouth 2 (two) times daily.    Marland Kitchen glipiZIDE (GLUCOTROL) 5 MG tablet Take 5 mg by mouth daily before breakfast.    . lisinopril (PRINIVIL,ZESTRIL) 20 MG tablet Take 20 mg by mouth 2 (two) times daily.    . metFORMIN (GLUCOPHAGE-XR) 500 MG 24 hr tablet Take 500 mg by mouth 2 (two) times daily.    . metoprolol tartrate (LOPRESSOR) 25 MG tablet Take 0.5 tablets (12.5 mg total) by mouth 2 (two) times daily. (  Patient taking differently: Take 25 mg by mouth 2 (two) times daily. ) 60 tablet 2  . potassium chloride (K-DUR) 10 MEQ tablet Take 10 mEq by mouth daily.     No current facility-administered medications for this visit.    SURGICAL HISTORY:  Past Surgical History:  Procedure Laterality Date  . ABDOMINAL HYSTERECTOMY    . ANTERIOR AND POSTERIOR REPAIR N/A 12/16/2014   Procedure: ANTERIOR (CYSTOCELE) AND POSTERIOR REPAIR (RECTOCELE);  Surgeon: Crawford Givens, MD;  Location: [facility];  Service: Gynecology;  Laterality: N/A;  . APPENDECTOMY    . BOWEL RESECTION    . BREAST BIOPSY Left   . COLONOSCOPY    . VIDEO BRONCHOSCOPY WITH ENDOBRONCHIAL ULTRASOUND N/A 12/17/2015   Procedure: VIDEO BRONCHOSCOPY WITH ENDOBRONCHIAL  ULTRASOUND;  Surgeon: Ivin Poot, MD;  Location: [facility];  Service: Thoracic;  Laterality: N/A;    REVIEW OF SYSTEMS:  A comprehensive review of systems was negative except for: Respiratory: positive for cough   PHYSICAL EXAMINATION: General appearance: alert, cooperative and no distress Head: Normocephalic, without obvious abnormality, atraumatic Neck: no adenopathy, no JVD, supple, symmetrical, trachea midline and thyroid not enlarged, symmetric, no tenderness/mass/nodules Lymph nodes: Cervical, supraclavicular, and axillary nodes normal. Resp: clear to auscultation bilaterally Back: symmetric, no curvature. ROM normal. No CVA tenderness. Cardio: regular rate and rhythm, S1, S2 normal, no murmur, click, rub or gallop GI: soft, non-tender; bowel sounds normal; no masses,  no organomegaly Extremities: extremities normal, atraumatic, no cyanosis or edema  ECOG PERFORMANCE STATUS: 1 - Symptomatic but completely ambulatory  Blood pressure (!) 155/62, pulse 69, temperature 98.2 F (36.8 C), temperature source Temporal, resp. rate 17, height 5\' 2"  (1.575 m), weight 118 lb 6.4 oz (53.7 kg), SpO2 100 %.  LABORATORY DATA: Lab Results  Component Value Date   WBC 7.7 11/21/2019   HGB 12.9 11/21/2019   HCT 42.3 11/21/2019   MCV 89.1 11/21/2019   PLT 203 11/21/2019      Chemistry      Component Value Date/Time   NA 144 11/21/2019 1245   NA 133 (L) 08/01/2017 1005   K 3.7 11/21/2019 1245   K 4.1 08/01/2017 1005   CL 103 11/21/2019 1245   CO2 31 11/21/2019 1245   CO2 27 08/01/2017 1005   BUN 20 11/21/2019 1245   BUN 10.4 08/01/2017 1005   CREATININE 1.27 (H) 11/21/2019 1245   CREATININE 0.7 08/01/2017 1005      Component Value Date/Time   CALCIUM 10.0 11/21/2019 1245   CALCIUM 9.1 08/01/2017 1005   ALKPHOS 115 11/21/2019 1245   ALKPHOS 77 08/01/2017 1005   AST 19 11/21/2019 1245   AST 51 (H) 08/01/2017 1005   ALT 10 11/21/2019 1245   ALT 63 (H) 08/01/2017 1005   BILITOT 0.4  11/21/2019 1245   BILITOT 0.40 08/01/2017 1005       RADIOGRAPHIC STUDIES: CT Chest Wo Contrast  Result Date: 11/21/2019 CLINICAL DATA:  Non-small cell lung cancer staging, history of radiotherapy completed in summer of 2018 EXAM: CT CHEST WITHOUT CONTRAST TECHNIQUE: Multidetector CT imaging of the chest was performed following the standard protocol without IV contrast. COMPARISON:  05/23/2019 FINDINGS: Cardiovascular: Moderate calcific atherosclerotic changes throughout the thoracic aorta. No signs of aneurysm. Stable appearance of the heart and with signs of calcified coronary artery disease. No signs of pericardial effusion. Central pulmonary vasculature is of normal caliber. Mediastinum/Nodes: No sign of adenopathy in the chest. Small lymph nodes seen on the previous study show no change.  Thyroid remains unchanged with generalized enlargement. Lungs/Pleura: Stable post treatment changes in the right chest. No signs of new, suspicious pulmonary nodule, consolidation or pleural effusion. Airways are patent. Area of fibrosis measuring 5 x 1.6 cm at the level of the hilum, with measured in a similar fashion this is unchanged. (Image 57, series 7) Upper Abdomen: Incidental imaging of upper abdominal contents is unremarkable. Musculoskeletal: Glenohumeral degenerative changes with signs of remote trauma to the left proximal humerus partially imaged. Not changed from prior study. No signs of acute or destructive bone process. IMPRESSION: 1. Post treatment changes in the right chest, not changed from prior exam. 2. No signs of metastatic or recurrent disease at this time in the chest. Aortic Atherosclerosis (ICD10-I70.0). Electronically Signed   By: Zetta Bills M.D.   On: 11/21/2019 16:18     ASSESSMENT AND PLAN: This is a very pleasant 85 years old stage IA non-small cell lung cancer presented with right upper lobe pulmonary nodule status post curative stereotactic body radiotherapy. The patient has  been on observation since that time. She had repeat CT scan of the chest performed recently.  I personally and independently reviewed the scans and discussed the results with the patient today. Her scan showed no concerning findings for disease recurrence or metastasis. I recommended for her to continue on observation with repeat CT scan of the chest in 1 year. She was advised to call immediately if she has any concerning symptoms in the interval. The patient voices understanding of current disease status and treatment options and is in agreement with the current care plan. All questions were answered. The patient knows to call the clinic with any problems, questions or concerns. We can certainly see the patient much sooner if necessary.  Disclaimer: This note was dictated with voice recognition software. Similar sounding words can inadvertently be transcribed and may not be corrected upon review.

## 2020-01-08 DIAGNOSIS — I25118 Atherosclerotic heart disease of native coronary artery with other forms of angina pectoris: Secondary | ICD-10-CM | POA: Diagnosis not present

## 2020-01-08 DIAGNOSIS — F17290 Nicotine dependence, other tobacco product, uncomplicated: Secondary | ICD-10-CM | POA: Diagnosis not present

## 2020-01-08 DIAGNOSIS — D649 Anemia, unspecified: Secondary | ICD-10-CM | POA: Diagnosis not present

## 2020-01-08 DIAGNOSIS — E559 Vitamin D deficiency, unspecified: Secondary | ICD-10-CM | POA: Diagnosis not present

## 2020-01-08 DIAGNOSIS — E785 Hyperlipidemia, unspecified: Secondary | ICD-10-CM | POA: Diagnosis not present

## 2020-01-08 DIAGNOSIS — I509 Heart failure, unspecified: Secondary | ICD-10-CM | POA: Diagnosis not present

## 2020-01-08 DIAGNOSIS — E119 Type 2 diabetes mellitus without complications: Secondary | ICD-10-CM | POA: Diagnosis not present

## 2020-01-08 DIAGNOSIS — I10 Essential (primary) hypertension: Secondary | ICD-10-CM | POA: Diagnosis not present

## 2020-01-20 DIAGNOSIS — E7800 Pure hypercholesterolemia, unspecified: Secondary | ICD-10-CM | POA: Diagnosis not present

## 2020-01-20 DIAGNOSIS — E1165 Type 2 diabetes mellitus with hyperglycemia: Secondary | ICD-10-CM | POA: Diagnosis not present

## 2020-01-20 DIAGNOSIS — E559 Vitamin D deficiency, unspecified: Secondary | ICD-10-CM | POA: Diagnosis not present

## 2020-01-20 DIAGNOSIS — C3490 Malignant neoplasm of unspecified part of unspecified bronchus or lung: Secondary | ICD-10-CM | POA: Diagnosis not present

## 2020-01-20 DIAGNOSIS — J441 Chronic obstructive pulmonary disease with (acute) exacerbation: Secondary | ICD-10-CM | POA: Diagnosis not present

## 2020-01-20 DIAGNOSIS — Z79899 Other long term (current) drug therapy: Secondary | ICD-10-CM | POA: Diagnosis not present

## 2020-01-20 DIAGNOSIS — D638 Anemia in other chronic diseases classified elsewhere: Secondary | ICD-10-CM | POA: Diagnosis not present

## 2020-01-20 DIAGNOSIS — J4521 Mild intermittent asthma with (acute) exacerbation: Secondary | ICD-10-CM | POA: Diagnosis not present

## 2020-01-20 DIAGNOSIS — I739 Peripheral vascular disease, unspecified: Secondary | ICD-10-CM | POA: Diagnosis not present

## 2020-01-20 DIAGNOSIS — Z0001 Encounter for general adult medical examination with abnormal findings: Secondary | ICD-10-CM | POA: Diagnosis not present

## 2020-01-20 DIAGNOSIS — E113299 Type 2 diabetes mellitus with mild nonproliferative diabetic retinopathy without macular edema, unspecified eye: Secondary | ICD-10-CM | POA: Diagnosis not present

## 2020-02-03 DIAGNOSIS — Z23 Encounter for immunization: Secondary | ICD-10-CM | POA: Diagnosis not present

## 2020-03-02 DIAGNOSIS — Z23 Encounter for immunization: Secondary | ICD-10-CM | POA: Diagnosis not present

## 2020-03-11 DIAGNOSIS — E113293 Type 2 diabetes mellitus with mild nonproliferative diabetic retinopathy without macular edema, bilateral: Secondary | ICD-10-CM | POA: Diagnosis not present

## 2020-03-11 DIAGNOSIS — H25812 Combined forms of age-related cataract, left eye: Secondary | ICD-10-CM | POA: Diagnosis not present

## 2020-03-11 DIAGNOSIS — H401133 Primary open-angle glaucoma, bilateral, severe stage: Secondary | ICD-10-CM | POA: Diagnosis not present

## 2020-04-08 DIAGNOSIS — F17290 Nicotine dependence, other tobacco product, uncomplicated: Secondary | ICD-10-CM | POA: Diagnosis not present

## 2020-04-08 DIAGNOSIS — E785 Hyperlipidemia, unspecified: Secondary | ICD-10-CM | POA: Diagnosis not present

## 2020-04-08 DIAGNOSIS — I509 Heart failure, unspecified: Secondary | ICD-10-CM | POA: Diagnosis not present

## 2020-04-08 DIAGNOSIS — I25118 Atherosclerotic heart disease of native coronary artery with other forms of angina pectoris: Secondary | ICD-10-CM | POA: Diagnosis not present

## 2020-04-08 DIAGNOSIS — D649 Anemia, unspecified: Secondary | ICD-10-CM | POA: Diagnosis not present

## 2020-04-08 DIAGNOSIS — E559 Vitamin D deficiency, unspecified: Secondary | ICD-10-CM | POA: Diagnosis not present

## 2020-04-08 DIAGNOSIS — I10 Essential (primary) hypertension: Secondary | ICD-10-CM | POA: Diagnosis not present

## 2020-04-13 ENCOUNTER — Ambulatory Visit (INDEPENDENT_AMBULATORY_CARE_PROVIDER_SITE_OTHER): Payer: Medicare Other | Admitting: Podiatry

## 2020-04-13 ENCOUNTER — Other Ambulatory Visit: Payer: Self-pay

## 2020-04-13 ENCOUNTER — Encounter: Payer: Self-pay | Admitting: Podiatry

## 2020-04-13 DIAGNOSIS — B351 Tinea unguium: Secondary | ICD-10-CM

## 2020-04-13 DIAGNOSIS — M79674 Pain in right toe(s): Secondary | ICD-10-CM | POA: Diagnosis not present

## 2020-04-13 DIAGNOSIS — M79675 Pain in left toe(s): Secondary | ICD-10-CM | POA: Diagnosis not present

## 2020-04-13 NOTE — Patient Instructions (Signed)
Diabetes Mellitus and Foot Care Foot care is an important part of your health, especially when you have diabetes. Diabetes may cause you to have problems because of poor blood flow (circulation) to your feet and legs, which can cause your skin to:  Become thinner and drier.  Break more easily.  Heal more slowly.  Peel and crack. You may also have nerve damage (neuropathy) in your legs and feet, causing decreased feeling in them. This means that you may not notice minor injuries to your feet that could lead to more serious problems. Noticing and addressing any potential problems early is the best way to prevent future foot problems. How to care for your feet Foot hygiene  Wash your feet daily with warm water and mild soap. Do not use hot water. Then, pat your feet and the areas between your toes until they are completely dry. Do not soak your feet as this can dry your skin.  Trim your toenails straight across. Do not dig under them or around the cuticle. File the edges of your nails with an emery board or nail file.  Apply a moisturizing lotion or petroleum jelly to the skin on your feet and to dry, brittle toenails. Use lotion that does not contain alcohol and is unscented. Do not apply lotion between your toes. Shoes and socks  Wear clean socks or stockings every day. Make sure they are not too tight. Do not wear knee-high stockings since they may decrease blood flow to your legs.  Wear shoes that fit properly and have enough cushioning. Always look in your shoes before you put them on to be sure there are no objects inside.  To break in new shoes, wear them for just a few hours a day. This prevents injuries on your feet. Wounds, scrapes, corns, and calluses  Check your feet daily for blisters, cuts, bruises, sores, and redness. If you cannot see the bottom of your feet, use a mirror or ask someone for help.  Do not cut corns or calluses or try to remove them with medicine.  If you  find a minor scrape, cut, or break in the skin on your feet, keep it and the skin around it clean and dry. You may clean these areas with mild soap and water. Do not clean the area with peroxide, alcohol, or iodine.  If you have a wound, scrape, corn, or callus on your foot, look at it several times a day to make sure it is healing and not infected. Check for: ? Redness, swelling, or pain. ? Fluid or blood. ? Warmth. ? Pus or a bad smell. General instructions  Do not cross your legs. This may decrease blood flow to your feet.  Do not use heating pads or hot water bottles on your feet. They may burn your skin. If you have lost feeling in your feet or legs, you may not know this is happening until it is too late.  Protect your feet from hot and cold by wearing shoes, such as at the beach or on hot pavement.  Schedule a complete foot exam at least once a year (annually) or more often if you have foot problems. If you have foot problems, report any cuts, sores, or bruises to your health care provider immediately. Contact a health care provider if:  You have a medical condition that increases your risk of infection and you have any cuts, sores, or bruises on your feet.  You have an injury that is not   healing.  You have redness on your legs or feet.  You feel burning or tingling in your legs or feet.  You have pain or cramps in your legs and feet.  Your legs or feet are numb.  Your feet always feel cold.  You have pain around a toenail. Get help right away if:  You have a wound, scrape, corn, or callus on your foot and: ? You have pain, swelling, or redness that gets worse. ? You have fluid or blood coming from the wound, scrape, corn, or callus. ? Your wound, scrape, corn, or callus feels warm to the touch. ? You have pus or a bad smell coming from the wound, scrape, corn, or callus. ? You have a fever. ? You have a red line going up your leg. Summary  Check your feet every day  for cuts, sores, red spots, swelling, and blisters.  Moisturize feet and legs daily.  Wear shoes that fit properly and have enough cushioning.  If you have foot problems, report any cuts, sores, or bruises to your health care provider immediately.  Schedule a complete foot exam at least once a year (annually) or more often if you have foot problems. This information is not intended to replace advice given to you by your health care provider. Make sure you discuss any questions you have with your health care provider. Document Revised: 08/13/2019 Document Reviewed: 12/22/2016 Elsevier Patient Education  2020 [employer]  Peripheral Neuropathy Peripheral neuropathy is a type of nerve damage. It affects nerves that carry signals between the spinal cord and the arms, legs, and the rest of the body (peripheral nerves). It does not affect nerves in the spinal cord or brain. In peripheral neuropathy, one nerve or a group of nerves may be damaged. Peripheral neuropathy is a broad category that includes many specific nerve disorders, like diabetic neuropathy, hereditary neuropathy, and carpal tunnel syndrome. What are the causes? This condition may be caused by:  Diabetes. This is the most common cause of peripheral neuropathy.  Nerve injury.  Pressure or stress on a nerve that lasts a long time.  Lack (deficiency) of B vitamins. This can result from alcoholism, poor diet, or a restricted diet.  Infections.  Autoimmune diseases, such as rheumatoid arthritis and systemic lupus erythematosus.  Nerve diseases that are passed from parent to child (inherited).  Some medicines, such as cancer medicines (chemotherapy).  Poisonous (toxic) substances, such as lead and mercury.  Too little blood flowing to the legs.  Kidney disease.  Thyroid disease. In some cases, the cause of this condition is not known. What are the signs or symptoms? Symptoms of this condition depend on which of your  nerves is damaged. Common symptoms include:  Loss of feeling (numbness) in the feet, hands, or both.  Tingling in the feet, hands, or both.  Burning pain.  Very sensitive skin.  Weakness.  Not being able to move a part of the body (paralysis).  Muscle twitching.  Clumsiness or poor coordination.  Loss of balance.  Not being able to control your bladder.  Feeling dizzy.  Sexual problems. How is this diagnosed? Diagnosing and finding the cause of peripheral neuropathy can be difficult. Your health care provider will take your medical history and do a physical exam. A neurological exam will also be done. This involves checking things that are affected by your brain, spinal cord, and nerves (nervous system). For example, your health care provider will check your reflexes, how you move, and   what you can feel. You may have other tests, such as:  Blood tests.  Electromyogram (EMG) and nerve conduction tests. These tests check nerve function and how well the nerves are controlling the muscles.  Imaging tests, such as CT scans or MRI to rule out other causes of your symptoms.  Removing a small piece of nerve to be examined in a lab (nerve biopsy). This is rare.  Removing and examining a small amount of the fluid that surrounds the brain and spinal cord (lumbar puncture). This is rare. How is this treated? Treatment for this condition may involve:  Treating the underlying cause of the neuropathy, such as diabetes, kidney disease, or vitamin deficiencies.  Stopping medicines that can cause neuropathy, such as chemotherapy.  Medicine to relieve pain. Medicines may include: ? Prescription or over-the-counter pain medicine. ? Antiseizure medicine. ? Antidepressants. ? Pain-relieving patches that are applied to painful areas of skin.  Surgery to relieve pressure on a nerve or to destroy a nerve that is causing pain.  Physical therapy to help improve movement and  balance.  Devices to help you move around (assistive devices). Follow these instructions at home: Medicines  Take over-the-counter and prescription medicines only as told by your health care provider. Do not take any other medicines without first asking your health care provider.  Do not drive or use heavy machinery while taking prescription pain medicine. Lifestyle   Do not use any products that contain nicotine or tobacco, such as cigarettes and e-cigarettes. Smoking keeps blood from reaching damaged nerves. If you need help quitting, ask your health care provider.  Avoid or limit alcohol. Too much alcohol can cause a vitamin B deficiency, and vitamin B is needed for healthy nerves.  Eat a healthy diet. This includes: ? Eating foods that are high in fiber, such as fresh fruits and vegetables, whole grains, and beans. ? Limiting foods that are high in fat and processed sugars, such as fried or sweet foods. General instructions   If you have diabetes, work closely with your health care provider to keep your blood sugar under control.  If you have numbness in your feet: ? Check every day for signs of injury or infection. Watch for redness, warmth, and swelling. ? Wear padded socks and comfortable shoes. These help protect your feet.  Develop a good support system. Living with peripheral neuropathy can be stressful. Consider talking with a mental health specialist or joining a support group.  Use assistive devices and attend physical therapy as told by your health care provider. This may include using a walker or a cane.  Keep all follow-up visits as told by your health care provider. This is important. Contact a health care provider if:  You have new signs or symptoms of peripheral neuropathy.  You are struggling emotionally from dealing with peripheral neuropathy.  Your pain is not well-controlled. Get help right away if:  You have an injury or infection that is not healing  normally.  You develop new weakness in an arm or leg.  You fall frequently. Summary  Peripheral neuropathy is when the nerves in the arms, or legs are damaged, resulting in numbness, weakness, or pain.  There are many causes of peripheral neuropathy, including diabetes, pinched nerves, vitamin deficiencies, autoimmune disease, and hereditary conditions.  Diagnosing and finding the cause of peripheral neuropathy can be difficult. Your health care provider will take your medical history, do a physical exam, and do tests, including blood tests and nerve function tests.    Treatment involves treating the underlying cause of the neuropathy and taking medicines to help control pain. Physical therapy and assistive devices may also help. This information is not intended to replace advice given to you by your health care provider. Make sure you discuss any questions you have with your health care provider. Document Revised: 11/02/2017 Document Reviewed: 01/29/2017 Elsevier Patient Education  2020 [employer] Inc[employer]  

## 2020-04-15 DIAGNOSIS — H25812 Combined forms of age-related cataract, left eye: Secondary | ICD-10-CM | POA: Diagnosis not present

## 2020-04-15 DIAGNOSIS — E113293 Type 2 diabetes mellitus with mild nonproliferative diabetic retinopathy without macular edema, bilateral: Secondary | ICD-10-CM | POA: Diagnosis not present

## 2020-04-15 DIAGNOSIS — H401133 Primary open-angle glaucoma, bilateral, severe stage: Secondary | ICD-10-CM | POA: Diagnosis not present

## 2020-04-22 NOTE — Progress Notes (Signed)
Subjective: Erin Carr presents today preventative diabetic foot care and painful mycotic nails b/l that are difficult to trim. Pain interferes with ambulation. Aggravating factors include wearing enclosed shoe gear. Pain is relieved with periodic professional debridement.  Erin Liberty, MD is patient's PCP.   She voices no new pedal concerns on today's visit.  Past Medical History:  Diagnosis Date  . Adenocarcinoma of right lung, stage 1 ([place]) 11/16/2016  . Arthritis   . Asthma   . COPD (chronic obstructive pulmonary disease) ([place])   . Diabetes mellitus without complication ([place])    Type II  . Family history of adverse reaction to anesthesia    "aunt had PONV"  . History of radiation therapy 12/19/16-12/27/16   SBRT to right upper lobe 54 Gy in 3 fractions  . Hypercholesteremia   . Hypertension   . Osteoporosis   . Positive PPD 12/16   PER PCP  . Shortness of breath dyspnea   . Tremors of nervous system    Left hand  . Urinary frequency   . Vitamin D deficiency   . Wears glasses      Current Outpatient Medications on File Prior to Visit  Medication Sig Dispense Refill  . albuterol (PROVENTIL HFA;VENTOLIN HFA) 108 (90 Base) MCG/ACT inhaler Inhale 1 puff into the lungs every 4 (four) hours as needed for wheezing or shortness of breath.    . ALPHAGAN P 0.1 % SOLN     . aspirin EC 81 MG tablet Take 81 mg by mouth daily.    Erin Carr Kitchen atorvastatin (LIPITOR) 40 MG tablet Take 1 tablet (40 mg total) by mouth daily at 6 PM. 30 tablet 2  . ergocalciferol (VITAMIN D2) 50000 units capsule Take 50,000 Units by mouth once a week. On Monday    . ferrous sulfate 325 (65 FE) MG tablet Take 1 tablet (325 mg total) by mouth daily. (Patient taking differently: Take 325 mg by mouth 3 (three) times a week. ) 30 tablet 0  . furosemide (LASIX) 20 MG tablet Take 3 tablets (60 mg total) 2 (two) times daily by mouth. 30 tablet 0  . gabapentin (NEURONTIN) 400 MG capsule Take 400 mg by mouth 2 (two)  times daily.    Erin Carr Kitchen glipiZIDE (GLUCOTROL) 5 MG tablet Take 5 mg by mouth daily before breakfast.    . lisinopril (PRINIVIL,ZESTRIL) 20 MG tablet Take 20 mg by mouth 2 (two) times daily.    Erin Carr Kitchen LUMIGAN 0.01 % SOLN     . metFORMIN (GLUCOPHAGE-XR) 500 MG 24 hr tablet Take 500 mg by mouth 2 (two) times daily.    . metoprolol tartrate (LOPRESSOR) 25 MG tablet Take 0.5 tablets (12.5 mg total) by mouth 2 (two) times daily. (Patient taking differently: Take 25 mg by mouth 2 (two) times daily. ) 60 tablet 2  . potassium chloride (K-DUR) 10 MEQ tablet Take 10 mEq by mouth daily.     No current facility-administered medications on file prior to visit.     No Known Allergies  Objective: Erin Carr is a pleasant 86 y.o. y.o. Patient Race: Black or African American [2]  female in NAD. AAO x 3.  There were no vitals filed for this visit.  Vascular Examination: Neurovascular status unchanged b/l. Palpable DP pulses b/l. Palpable PT pulses b/l. Pedal hair absent b/l Skin temperature gradient within normal limits b/l. No edema noted b/l.  Dermatological Examination: Pedal skin with normal turgor, texture and tone bilaterally. No open wounds bilaterally. No interdigital macerations bilaterally.  Toenails 1-5 b/l elongated, dystrophic, thickened, crumbly with subungual debris and tenderness to dorsal palpation.  Musculoskeletal: No gross bony deformities bilaterally. No pain crepitus or joint limitation noted with ROM b/l. Muscle strength 5/5 to LLE; RLE with drop foot. RLE 5/5 plantarflexion, inversion/eversion.  Neurological Examination: Protective sensation diminished with 10g monofilament b/l. Vibratory sensation diminished b/l.  Assessment: 1. Pain due to onychomycosis of toenails of both feet    Plan: -Examined patient. -No new findings. No new orders. -Continue diabetic foot care principles. Literature dispensed on today.  -Toenails 1-5 b/l were debrided in length and girth with sterile  nail nippers and dremel without iatrogenic bleeding.  -Patient to continue soft, supportive shoe gear daily. -Patient to report any pedal injuries to medical professional immediately. -Patient/POA to call should there be question/concern in the interim.  Return in about 3 months (around 07/14/2020) for diabetic nail trim.  Marzetta Board, DPM

## 2020-07-08 DIAGNOSIS — E785 Hyperlipidemia, unspecified: Secondary | ICD-10-CM | POA: Diagnosis not present

## 2020-07-08 DIAGNOSIS — I10 Essential (primary) hypertension: Secondary | ICD-10-CM | POA: Diagnosis not present

## 2020-07-08 DIAGNOSIS — I5020 Unspecified systolic (congestive) heart failure: Secondary | ICD-10-CM | POA: Diagnosis not present

## 2020-07-08 DIAGNOSIS — I25118 Atherosclerotic heart disease of native coronary artery with other forms of angina pectoris: Secondary | ICD-10-CM | POA: Diagnosis not present

## 2020-07-08 DIAGNOSIS — F17290 Nicotine dependence, other tobacco product, uncomplicated: Secondary | ICD-10-CM | POA: Diagnosis not present

## 2020-07-20 ENCOUNTER — Encounter: Payer: Self-pay | Admitting: Podiatry

## 2020-07-20 ENCOUNTER — Ambulatory Visit (INDEPENDENT_AMBULATORY_CARE_PROVIDER_SITE_OTHER): Payer: Medicare Other | Admitting: Podiatry

## 2020-07-20 ENCOUNTER — Other Ambulatory Visit: Payer: Self-pay

## 2020-07-20 DIAGNOSIS — M79675 Pain in left toe(s): Secondary | ICD-10-CM

## 2020-07-20 DIAGNOSIS — M79674 Pain in right toe(s): Secondary | ICD-10-CM

## 2020-07-20 DIAGNOSIS — B351 Tinea unguium: Secondary | ICD-10-CM | POA: Diagnosis not present

## 2020-07-20 DIAGNOSIS — E1142 Type 2 diabetes mellitus with diabetic polyneuropathy: Secondary | ICD-10-CM | POA: Diagnosis not present

## 2020-07-20 DIAGNOSIS — M21371 Foot drop, right foot: Secondary | ICD-10-CM

## 2020-07-22 NOTE — Progress Notes (Signed)
Subjective: Erin Carr presents today at risk foot care with history of diabetic neuropathy and painful thick toenails that are difficult to trim. Pain interferes with ambulation. Aggravating factors include wearing enclosed shoe gear. Pain is relieved with periodic professional debridement.  Erin Liberty, MD is patient's PCP. Last visit was 01/20/2020.  She voices no new pedal concerns on today's visit.  Past Medical History:  Diagnosis Date  . Adenocarcinoma of right lung, stage 1 ([place]) 11/16/2016  . Arthritis   . Asthma   . COPD (chronic obstructive pulmonary disease) ([place])   . Diabetes mellitus without complication ([place])    Type II  . Family history of adverse reaction to anesthesia    "aunt had PONV"  . History of radiation therapy 12/19/16-12/27/16   SBRT to right upper lobe 54 Gy in 3 fractions  . Hypercholesteremia   . Hypertension   . Osteoporosis   . Positive PPD 12/16   PER PCP  . Shortness of breath dyspnea   . Tremors of nervous system    Left hand  . Urinary frequency   . Vitamin D deficiency   . Wears glasses      Current Outpatient Medications on File Prior to Visit  Medication Sig Dispense Refill  . albuterol (PROVENTIL HFA;VENTOLIN HFA) 108 (90 Base) MCG/ACT inhaler Inhale 1 puff into the lungs every 4 (four) hours as needed for wheezing or shortness of breath.    . ALPHAGAN P 0.1 % SOLN     . aspirin EC 81 MG tablet Take 81 mg by mouth daily.    Marland Kitchen atorvastatin (LIPITOR) 40 MG tablet Take 1 tablet (40 mg total) by mouth daily at 6 PM. 30 tablet 2  . ergocalciferol (VITAMIN D2) 50000 units capsule Take 50,000 Units by mouth once a week. On Monday    . ferrous sulfate 325 (65 FE) MG tablet Take 1 tablet (325 mg total) by mouth daily. (Patient taking differently: Take 325 mg by mouth 3 (three) times a week. ) 30 tablet 0  . furosemide (LASIX) 20 MG tablet Take 3 tablets (60 mg total) 2 (two) times daily by mouth. 30 tablet 0  . gabapentin (NEURONTIN)  400 MG capsule Take 400 mg by mouth 2 (two) times daily.    Marland Kitchen glipiZIDE (GLUCOTROL) 5 MG tablet Take 5 mg by mouth daily before breakfast.    . lisinopril (PRINIVIL,ZESTRIL) 20 MG tablet Take 20 mg by mouth 2 (two) times daily.    Marland Kitchen LUMIGAN 0.01 % SOLN     . metFORMIN (GLUCOPHAGE-XR) 500 MG 24 hr tablet Take 500 mg by mouth 2 (two) times daily.    . metoprolol tartrate (LOPRESSOR) 25 MG tablet Take 0.5 tablets (12.5 mg total) by mouth 2 (two) times daily. (Patient taking differently: Take 25 mg by mouth 2 (two) times daily. ) 60 tablet 2  . potassium chloride (K-DUR) 10 MEQ tablet Take 10 mEq by mouth daily.     No current facility-administered medications on file prior to visit.     No Known Allergies  Objective: Erin Carr is a pleasant 86 y.o. y.o. Patient Race: Black or African American [2]  female in NAD. AAO x 3.  There were no vitals filed for this visit.  Vascular Examination: Neurovascular status unchanged b/l. Palpable DP pulses b/l. Palpable PT pulses b/l. Pedal hair absent b/l Skin temperature gradient within normal limits b/l. No edema noted b/l.  Dermatological Examination: Pedal skin with normal turgor, texture and tone bilaterally. No  open wounds bilaterally. No interdigital macerations bilaterally. Toenails 1-5 b/l elongated, dystrophic, thickened, crumbly with subungual debris and tenderness to dorsal palpation.  Musculoskeletal: No gross bony deformities bilaterally. No pain crepitus or joint limitation noted with ROM b/l. Muscle strength 5/5 to LLE; RLE with drop foot. RLE 5/5 plantarflexion, inversion/eversion.  Neurological Examination: Protective sensation diminished with 10g monofilament b/l. Vibratory sensation diminished b/l.  Assessment: 1. Pain due to onychomycosis of toenails of both feet   2. Foot drop, right   3. Diabetic peripheral neuropathy associated with type 2 diabetes mellitus ([place])    Plan: -Examined patient. -No new findings. No new  orders. -Continue diabetic foot care principles. -Toenails 1-5 b/l were debrided in length and girth with sterile nail nippers and dremel without iatrogenic bleeding.  -Patient to continue soft, supportive shoe gear daily. -Patient to report any pedal injuries to medical professional immediately. -Patient/POA to call should there be question/concern in the interim.  Return in about 3 months (around 10/20/2020) for diabetic nail trim.  Erin Carr, DPM

## 2020-08-19 DIAGNOSIS — E113293 Type 2 diabetes mellitus with mild nonproliferative diabetic retinopathy without macular edema, bilateral: Secondary | ICD-10-CM | POA: Diagnosis not present

## 2020-08-19 DIAGNOSIS — H25812 Combined forms of age-related cataract, left eye: Secondary | ICD-10-CM | POA: Diagnosis not present

## 2020-10-01 DIAGNOSIS — I10 Essential (primary) hypertension: Secondary | ICD-10-CM | POA: Diagnosis not present

## 2020-10-01 DIAGNOSIS — E7889 Other lipoprotein metabolism disorders: Secondary | ICD-10-CM | POA: Diagnosis not present

## 2020-10-01 DIAGNOSIS — E119 Type 2 diabetes mellitus without complications: Secondary | ICD-10-CM | POA: Diagnosis not present

## 2020-10-07 DIAGNOSIS — I25118 Atherosclerotic heart disease of native coronary artery with other forms of angina pectoris: Secondary | ICD-10-CM | POA: Diagnosis not present

## 2020-10-07 DIAGNOSIS — I5020 Unspecified systolic (congestive) heart failure: Secondary | ICD-10-CM | POA: Diagnosis not present

## 2020-10-07 DIAGNOSIS — I10 Essential (primary) hypertension: Secondary | ICD-10-CM | POA: Diagnosis not present

## 2020-10-07 DIAGNOSIS — E785 Hyperlipidemia, unspecified: Secondary | ICD-10-CM | POA: Diagnosis not present

## 2020-10-07 DIAGNOSIS — E119 Type 2 diabetes mellitus without complications: Secondary | ICD-10-CM | POA: Diagnosis not present

## 2020-10-20 ENCOUNTER — Encounter: Payer: Self-pay | Admitting: Podiatry

## 2020-10-20 ENCOUNTER — Other Ambulatory Visit: Payer: Self-pay

## 2020-10-20 ENCOUNTER — Ambulatory Visit (INDEPENDENT_AMBULATORY_CARE_PROVIDER_SITE_OTHER): Payer: Medicare Other | Admitting: Podiatry

## 2020-10-20 DIAGNOSIS — M2041 Other hammer toe(s) (acquired), right foot: Secondary | ICD-10-CM

## 2020-10-20 DIAGNOSIS — B351 Tinea unguium: Secondary | ICD-10-CM

## 2020-10-20 DIAGNOSIS — E1142 Type 2 diabetes mellitus with diabetic polyneuropathy: Secondary | ICD-10-CM | POA: Diagnosis not present

## 2020-10-20 DIAGNOSIS — M79675 Pain in left toe(s): Secondary | ICD-10-CM | POA: Diagnosis not present

## 2020-10-20 DIAGNOSIS — L84 Corns and callosities: Secondary | ICD-10-CM | POA: Diagnosis not present

## 2020-10-20 DIAGNOSIS — M79674 Pain in right toe(s): Secondary | ICD-10-CM | POA: Diagnosis not present

## 2020-10-20 DIAGNOSIS — M2042 Other hammer toe(s) (acquired), left foot: Secondary | ICD-10-CM | POA: Diagnosis not present

## 2020-10-20 DIAGNOSIS — I739 Peripheral vascular disease, unspecified: Secondary | ICD-10-CM | POA: Diagnosis not present

## 2020-10-20 DIAGNOSIS — E119 Type 2 diabetes mellitus without complications: Secondary | ICD-10-CM

## 2020-10-24 NOTE — Progress Notes (Addendum)
ANNUAL DIABETIC FOOT EXAM  Subjective: Erin Carr presents today for for annual diabetic foot examination and painful thick toenails that are difficult to trim. Pain interferes with ambulation. Aggravating factors include wearing enclosed shoe gear. Pain is relieved with periodic professional debridement..  Patient relates 12 year h/o diabetes.  Patient denies any h/o foot wounds.  Patient denies symptoms of foot numbness.  Patient denies symptoms of foot tingling.  Patient denies symptoms of burning in feet.  Patient did not check blood glucose this morning.  Vincente Liberty, MD is patient's PCP. Last visit was 01/20/2020.  Past Medical History:  Diagnosis Date  . Adenocarcinoma of right lung, stage 1 ([place]) 11/16/2016  . Arthritis   . Asthma   . COPD (chronic obstructive pulmonary disease) ([place])   . Diabetes mellitus without complication ([place])    Type II  . Family history of adverse reaction to anesthesia    "aunt had PONV"  . History of radiation therapy 12/19/16-12/27/16   SBRT to right upper lobe 54 Gy in 3 fractions  . Hypercholesteremia   . Hypertension   . Osteoporosis   . Positive PPD 12/16   PER PCP  . Shortness of breath dyspnea   . Tremors of nervous system    Left hand  . Urinary frequency   . Vitamin D deficiency   . Wears glasses    Patient Active Problem List   Diagnosis Date Noted  . Overweight with body mass index (BMI) 25.0-29.9 04/03/2018  . History of cystocele 04/03/2018  . Candidiasis of vagina 04/03/2018  . Diabetes mellitus ([place]) 04/03/2018  . Hyperlipidemia 04/03/2018  . Hypertensive disorder 04/03/2018  . Postmenopausal bleeding 04/03/2018  . Urinary incontinence 04/03/2018  . Acute CHF (congestive heart failure) ([place]) 10/22/2017  . Foot drop 10/22/2017  . Facial droop 10/22/2017  . NSTEMI (non-ST elevated myocardial infarction) ([place]) 10/22/2017  . Fall 10/22/2017  . Leukocytosis 10/22/2017  . Radiation pneumonitis ([place])  10/22/2017  . Foot drop, right   . Shortness of breath 08/09/2017  . Microcytic anemia 07/18/2017  . CAP (community acquired pneumonia) 07/18/2017  . Asthma, chronic, unspecified asthma severity, with acute exacerbation 07/18/2017  . Type 2 diabetes mellitus without complication ([place]) 01/60/1093  . Adenocarcinoma of right lung, stage 1 ([place]) 11/16/2016  . Neoplasm of uncertain behavior of right upper lobe of lung 12/01/2015  . Positive PPD   . Pelvic prolapse 12/16/2014   Past Surgical History:  Procedure Laterality Date  . ABDOMINAL HYSTERECTOMY    . ANTERIOR AND POSTERIOR REPAIR N/A 12/16/2014   Procedure: ANTERIOR (CYSTOCELE) AND POSTERIOR REPAIR (RECTOCELE);  Surgeon: Crawford Givens, MD;  Location: [facility];  Service: Gynecology;  Laterality: N/A;  . APPENDECTOMY    . BOWEL RESECTION    . BREAST BIOPSY Left   . COLONOSCOPY    . VIDEO BRONCHOSCOPY WITH ENDOBRONCHIAL ULTRASOUND N/A 12/17/2015   Procedure: VIDEO BRONCHOSCOPY WITH ENDOBRONCHIAL ULTRASOUND;  Surgeon: Ivin Poot, MD;  Location: [facility];  Service: Thoracic;  Laterality: N/A;   Current Outpatient Medications on File Prior to Visit  Medication Sig Dispense Refill  . albuterol (PROVENTIL HFA;VENTOLIN HFA) 108 (90 Base) MCG/ACT inhaler Inhale 1 puff into the lungs every 4 (four) hours as needed for wheezing or shortness of breath.    . ALPHAGAN P 0.1 % SOLN     . amLODipine (NORVASC) 10 MG tablet     . amLODipine (NORVASC) 5 MG tablet     . aspirin EC 81 MG tablet Take  81 mg by mouth daily.    Marland Kitchen atorvastatin (LIPITOR) 40 MG tablet Take 1 tablet (40 mg total) by mouth daily at 6 PM. 30 tablet 2  . ergocalciferol (VITAMIN D2) 50000 units capsule Take 50,000 Units by mouth once a week. On Monday    . ferrous sulfate 325 (65 FE) MG tablet Take 1 tablet (325 mg total) by mouth daily. (Patient taking differently: Take 325 mg by mouth 3 (three) times a week. ) 30 tablet 0  . furosemide (LASIX) 20 MG tablet Take 3 tablets (60 mg  total) 2 (two) times daily by mouth. 30 tablet 0  . gabapentin (NEURONTIN) 400 MG capsule Take 400 mg by mouth 2 (two) times daily.    Marland Kitchen glipiZIDE (GLUCOTROL) 5 MG tablet Take 5 mg by mouth daily before breakfast.    . latanoprost (XALATAN) 0.005 % ophthalmic solution     . lisinopril (PRINIVIL,ZESTRIL) 20 MG tablet Take 20 mg by mouth 2 (two) times daily.    Marland Kitchen LUMIGAN 0.01 % SOLN     . metFORMIN (GLUCOPHAGE-XR) 500 MG 24 hr tablet Take 500 mg by mouth 2 (two) times daily.    . metoprolol tartrate (LOPRESSOR) 25 MG tablet Take 0.5 tablets (12.5 mg total) by mouth 2 (two) times daily. (Patient taking differently: Take 25 mg by mouth 2 (two) times daily. ) 60 tablet 2  . potassium chloride (K-DUR) 10 MEQ tablet Take 10 mEq by mouth daily.     No current facility-administered medications on file prior to visit.    No Known Allergies Social History   Occupational History  . Not on file  Tobacco Use  . Smoking status: Former Smoker    Packs/day: 0.50    Years: 40.00    Pack years: 20.00    Types: Cigarettes    Quit date: 12/08/2001    Years since quitting: 18.8  . Smokeless tobacco: Never Used  . Tobacco comment: quit about 15-16 years ago; 12/09/2015  Substance and Sexual Activity  . Alcohol use: No  . Drug use: No  . Sexual activity: Not on file   Family History  Problem Relation Age of Onset  . Hypertension Father   . Diabetes Father    Immunization History  Administered Date(s) Administered  . Influenza-Unspecified 10/14/2015     Review of Systems: Negative except as noted in the HPI.  Objective: There were no vitals filed for this visit.  Erin Carr is a pleasant 86 y.o. female in NAD. AAO X 3.  Vascular Examination: Capillary fill time to digits <3 seconds b/l lower extremities. DP pulse nonpalpable left foot. DP pulse palpable right foot. PT pulse nonpalpable left foot. PT pulse nonpalpable right foot. Pedal hair absent. Lower extremity skin temperature gradient  within normal limits. No pain with calf compression b/l. No ischemia or gangrene noted b/l lower extremities.  Dermatological Examination: Pedal skin with normal turgor, texture and tone bilaterally. No open wounds bilaterally. No interdigital macerations bilaterally. Toenails 1-5 b/l elongated, discolored, dystrophic, thickened, crumbly with subungual debris and tenderness to dorsal palpation. Preulcerative hyperkeratotic lesion right 5th digit PIPJ.  Musculoskeletal Examination: Normal muscle strength 5/5 to all lower extremity muscle groups bilaterally. Dropfoot left lower extremity. Hammertoes noted to the 2-5 bilaterally. Footwear Assessment: Does the patient wear appropriate shoes? Yes. Does the patient need inserts/orthotics? No.  Neurological Examination: Protective sensation diminished with 10g monofilament b/l. Vibratory sensation diminished b/l. Proprioception intact bilaterally.  Assessment: 1. Pain due to onychomycosis of toenails of  both feet   2. Corns   3. Acquired hammertoes of both feet   4. Diabetic peripheral neuropathy associated with type 2 diabetes mellitus ([place])   5. PAD (peripheral artery disease) ([place])   6. Encounter for diabetic foot exam ([place])     ADA Risk Categorization: High Risk  Patient has one or more of the following: Loss of protective sensation Absent pedal pulses Severe Foot deformity History of foot ulcer  Plan: -Examined patient. -Diabetic foot examination performed on today's visit. -Continue diabetic foot care principles. -Patient to continue soft, supportive shoe gear daily. -Toenails 1-5 b/l were debrided in length and girth with sterile nail nippers and dremel without iatrogenic bleeding.  -Corn(s) R 5th toe pared utilizing sterile scalpel blade without complication or incident. Total number debrided=1. -Patient to report any pedal injuries to medical professional immediately. -Patient/POA to call should there be question/concern in  the interim.  Return in about 3 months (around 01/20/2021) for toenail debridement w/corn(s)/callus(es).  Marzetta Board, DPM

## 2020-11-24 ENCOUNTER — Inpatient Hospital Stay: Payer: Medicare Other | Attending: Internal Medicine

## 2020-11-24 ENCOUNTER — Ambulatory Visit (HOSPITAL_COMMUNITY)
Admission: RE | Admit: 2020-11-24 | Discharge: 2020-11-24 | Disposition: A | Discharge status: Home / Self Care | Payer: Medicare Other | Source: Ambulatory Visit | Attending: Internal Medicine | Admitting: Internal Medicine

## 2020-11-24 ENCOUNTER — Other Ambulatory Visit: Payer: Self-pay

## 2020-11-24 ENCOUNTER — Encounter (HOSPITAL_COMMUNITY): Payer: Self-pay

## 2020-11-24 DIAGNOSIS — J8410 Pulmonary fibrosis, unspecified: Secondary | ICD-10-CM | POA: Diagnosis not present

## 2020-11-24 DIAGNOSIS — C3411 Malignant neoplasm of upper lobe, right bronchus or lung: Secondary | ICD-10-CM | POA: Diagnosis not present

## 2020-11-24 DIAGNOSIS — C3490 Malignant neoplasm of unspecified part of unspecified bronchus or lung: Secondary | ICD-10-CM | POA: Diagnosis not present

## 2020-11-24 DIAGNOSIS — I2510 Atherosclerotic heart disease of native coronary artery without angina pectoris: Secondary | ICD-10-CM | POA: Diagnosis not present

## 2020-11-24 DIAGNOSIS — J181 Lobar pneumonia, unspecified organism: Secondary | ICD-10-CM | POA: Diagnosis not present

## 2020-11-24 LAB — CBC WITH DIFFERENTIAL (CANCER CENTER ONLY)
Abs Immature Granulocytes: 0.02 10*3/uL (ref 0.00–0.07)
Basophils Absolute: 0 10*3/uL (ref 0.0–0.1)
Basophils Relative: 1 %
Eosinophils Absolute: 0.5 10*3/uL (ref 0.0–0.5)
Eosinophils Relative: 7 %
HCT: 37.4 % (ref 36.0–46.0)
Hemoglobin: 11.5 g/dL — ABNORMAL LOW (ref 12.0–15.0)
Immature Granulocytes: 0 %
Lymphocytes Relative: 22 %
Lymphs Abs: 1.5 10*3/uL (ref 0.7–4.0)
MCH: 26.9 pg (ref 26.0–34.0)
MCHC: 30.7 g/dL (ref 30.0–36.0)
MCV: 87.4 fL (ref 80.0–100.0)
Monocytes Absolute: 0.7 10*3/uL (ref 0.1–1.0)
Monocytes Relative: 10 %
Neutro Abs: 4.1 10*3/uL (ref 1.7–7.7)
Neutrophils Relative %: 60 %
Platelet Count: 196 10*3/uL (ref 150–400)
RBC: 4.28 MIL/uL (ref 3.87–5.11)
RDW: 13.5 % (ref 11.5–15.5)
WBC Count: 6.8 10*3/uL (ref 4.0–10.5)
nRBC: 0 % (ref 0.0–0.2)

## 2020-11-24 LAB — CMP (CANCER CENTER ONLY)
ALT: 10 U/L (ref 0–44)
AST: 17 U/L (ref 15–41)
Albumin: 4.3 g/dL (ref 3.5–5.0)
Alkaline Phosphatase: 95 U/L (ref 38–126)
Anion gap: 10 (ref 5–15)
BUN: 20 mg/dL (ref 8–23)
CO2: 30 mmol/L (ref 22–32)
Calcium: 10.8 mg/dL — ABNORMAL HIGH (ref 8.9–10.3)
Chloride: 102 mmol/L (ref 98–111)
Creatinine: 1.39 mg/dL — ABNORMAL HIGH (ref 0.44–1.00)
GFR, Estimated: 36 mL/min — ABNORMAL LOW (ref >60)
Glucose, Bld: 115 mg/dL — ABNORMAL HIGH (ref 70–99)
Potassium: 4.1 mmol/L (ref 3.5–5.1)
Sodium: 142 mmol/L (ref 135–145)
Total Bilirubin: 0.5 mg/dL (ref 0.3–1.2)
Total Protein: 7.4 g/dL (ref 6.5–8.1)

## 2020-11-29 ENCOUNTER — Encounter: Payer: Self-pay | Admitting: Internal Medicine

## 2020-11-29 ENCOUNTER — Inpatient Hospital Stay (HOSPITAL_BASED_OUTPATIENT_CLINIC_OR_DEPARTMENT_OTHER): Payer: Medicare Other | Admitting: Internal Medicine

## 2020-11-29 ENCOUNTER — Telehealth: Payer: Self-pay | Admitting: Internal Medicine

## 2020-11-29 ENCOUNTER — Other Ambulatory Visit: Payer: Self-pay

## 2020-11-29 VITALS — BP 173/59 | HR 66 | Temp 97.0°F | Resp 15 | Wt 114.0 lb

## 2020-11-29 DIAGNOSIS — C3411 Malignant neoplasm of upper lobe, right bronchus or lung: Secondary | ICD-10-CM | POA: Diagnosis not present

## 2020-11-29 DIAGNOSIS — C3490 Malignant neoplasm of unspecified part of unspecified bronchus or lung: Secondary | ICD-10-CM | POA: Diagnosis not present

## 2020-11-29 DIAGNOSIS — C3491 Malignant neoplasm of unspecified part of right bronchus or lung: Secondary | ICD-10-CM

## 2020-11-29 NOTE — Telephone Encounter (Signed)
Scheduled appointments per 12/27 los. Spoke to patient who is aware of appointments dates and times.

## 2020-11-29 NOTE — Progress Notes (Signed)
[facility] [phone]   [phone]  OFFICE PROGRESS NOTE  Vincente Liberty, MD [place] [address]  DIAGNOSIS: Stage IA (T1b, N0, M0) non-small cell lung cancer, adenocarcinoma presented with right upper lobe lung nodule diagnosed in November 2017.  PRIOR THERAPY: Curative stereotactic body radiotherapy to the right upper lobe lung nodule under the care of Dr. Sondra Come completed on 12/27/2016.  CURRENT THERAPY: Observation.  INTERVAL HISTORY: Erin Carr 86 y.o. female returns to the clinic today for annual follow-up visit.  The patient is feeling fine today with no concerning complaints.  She denied having any current chest pain, shortness of breath, cough or hemoptysis.  She has no recent weight loss or night sweats.  She has no nausea, vomiting, diarrhea or constipation.  She denied having any headache or visual changes.  She had repeat CT scan of the chest performed recently and she is here for evaluation and discussion of her risk her results.  MEDICAL HISTORY: Past Medical History:  Diagnosis Date  . Adenocarcinoma of right lung, stage 1 ([place]) 11/16/2016  . Arthritis   . Asthma   . COPD (chronic obstructive pulmonary disease) ([place])   . Diabetes mellitus without complication ([place])    Type II  . Family history of adverse reaction to anesthesia    "aunt had PONV"  . History of radiation therapy 12/19/16-12/27/16   SBRT to right upper lobe 54 Gy in 3 fractions  . Hypercholesteremia   . Hypertension   . Osteoporosis   . Positive PPD 12/16   PER PCP  . Shortness of breath dyspnea   . Tremors of nervous system    Left hand  . Urinary frequency   . Vitamin D deficiency   . Wears glasses     ALLERGIES:  has No Known Allergies.  MEDICATIONS:  Current Outpatient Medications  Medication Sig Dispense Refill  . albuterol (PROVENTIL HFA;VENTOLIN HFA) 108 (90 Base) MCG/ACT inhaler Inhale 1 puff into the lungs  every 4 (four) hours as needed for wheezing or shortness of breath.    . ALPHAGAN P 0.1 % SOLN     . amLODipine (NORVASC) 10 MG tablet     . aspirin EC 81 MG tablet Take 81 mg by mouth daily.    Marland Kitchen atorvastatin (LIPITOR) 40 MG tablet Take 1 tablet (40 mg total) by mouth daily at 6 PM. 30 tablet 2  . ergocalciferol (VITAMIN D2) 50000 units capsule Take 50,000 Units by mouth once a week. On Monday    . ferrous sulfate 325 (65 FE) MG tablet Take 1 tablet (325 mg total) by mouth daily. (Patient taking differently: Take 325 mg by mouth 3 (three) times a week. ) 30 tablet 0  . furosemide (LASIX) 20 MG tablet Take 3 tablets (60 mg total) 2 (two) times daily by mouth. 30 tablet 0  . gabapentin (NEURONTIN) 400 MG capsule Take 400 mg by mouth 2 (two) times daily.    Marland Kitchen glipiZIDE (GLUCOTROL) 5 MG tablet Take 5 mg by mouth daily before breakfast.    . latanoprost (XALATAN) 0.005 % ophthalmic solution     . lisinopril (PRINIVIL,ZESTRIL) 20 MG tablet Take 20 mg by mouth 2 (two) times daily.    Marland Kitchen LUMIGAN 0.01 % SOLN     . metFORMIN (GLUCOPHAGE-XR) 500 MG 24 hr tablet Take 500 mg by mouth 2 (two) times daily.    . metoprolol tartrate (LOPRESSOR) 25 MG tablet Take 0.5  tablets (12.5 mg total) by mouth 2 (two) times daily. (Patient taking differently: Take 25 mg by mouth 2 (two) times daily. ) 60 tablet 2  . potassium chloride (K-DUR) 10 MEQ tablet Take 10 mEq by mouth daily.     No current facility-administered medications for this visit.    SURGICAL HISTORY:  Past Surgical History:  Procedure Laterality Date  . ABDOMINAL HYSTERECTOMY    . ANTERIOR AND POSTERIOR REPAIR N/A 12/16/2014   Procedure: ANTERIOR (CYSTOCELE) AND POSTERIOR REPAIR (RECTOCELE);  Surgeon: Crawford Givens, MD;  Location: [facility];  Service: Gynecology;  Laterality: N/A;  . APPENDECTOMY    . BOWEL RESECTION    . BREAST BIOPSY Left   . COLONOSCOPY    . VIDEO BRONCHOSCOPY WITH ENDOBRONCHIAL ULTRASOUND N/A 12/17/2015   Procedure: VIDEO  BRONCHOSCOPY WITH ENDOBRONCHIAL ULTRASOUND;  Surgeon: Ivin Poot, MD;  Location: [facility];  Service: Thoracic;  Laterality: N/A;    REVIEW OF SYSTEMS:  A comprehensive review of systems was negative.   PHYSICAL EXAMINATION: General appearance: alert, cooperative and no distress Head: Normocephalic, without obvious abnormality, atraumatic Neck: no adenopathy, no JVD, supple, symmetrical, trachea midline and thyroid not enlarged, symmetric, no tenderness/mass/nodules Lymph nodes: Cervical, supraclavicular, and axillary nodes normal. Resp: clear to auscultation bilaterally Back: symmetric, no curvature. ROM normal. No CVA tenderness. Cardio: regular rate and rhythm, S1, S2 normal, no murmur, click, rub or gallop GI: soft, non-tender; bowel sounds normal; no masses,  no organomegaly Extremities: extremities normal, atraumatic, no cyanosis or edema  ECOG PERFORMANCE STATUS: 1 - Symptomatic but completely ambulatory  Blood pressure (!) 173/59, pulse 66, temperature (!) 97 F (36.1 C), temperature source Tympanic, resp. rate 15, weight 114 lb (51.7 kg), SpO2 100 %.  LABORATORY DATA: Lab Results  Component Value Date   WBC 6.8 11/24/2020   HGB 11.5 (L) 11/24/2020   HCT 37.4 11/24/2020   MCV 87.4 11/24/2020   PLT 196 11/24/2020      Chemistry      Component Value Date/Time   NA 142 11/24/2020 1000   NA 133 (L) 08/01/2017 1005   K 4.1 11/24/2020 1000   K 4.1 08/01/2017 1005   CL 102 11/24/2020 1000   CO2 30 11/24/2020 1000   CO2 27 08/01/2017 1005   BUN 20 11/24/2020 1000   BUN 10.4 08/01/2017 1005   CREATININE 1.39 (H) 11/24/2020 1000   CREATININE 0.7 08/01/2017 1005      Component Value Date/Time   CALCIUM 10.8 (H) 11/24/2020 1000   CALCIUM 9.1 08/01/2017 1005   ALKPHOS 95 11/24/2020 1000   ALKPHOS 77 08/01/2017 1005   AST 17 11/24/2020 1000   AST 51 (H) 08/01/2017 1005   ALT 10 11/24/2020 1000   ALT 63 (H) 08/01/2017 1005   BILITOT 0.5 11/24/2020 1000   BILITOT 0.40  08/01/2017 1005       RADIOGRAPHIC STUDIES: CT Chest Wo Contrast  Result Date: 11/24/2020 CLINICAL DATA:  Primary Cancer Type: Lung Imaging Indication: Routine surveillance Interval therapy since last imaging? No Initial Cancer Diagnosis Date: 10/31/2016; Established by: Biopsy-proven Detailed Pathology: Stage IA non-small cell lung cancer, adenocarcinoma. Primary Tumor location: Right upper lobe. Surgeries: No thoracic. Chemotherapy: No Immunotherapy? No Radiation therapy? Yes; Date Range: 12/19/2016 - 12/27/2016; Target: Right upper lobe EXAM: CT CHEST WITHOUT CONTRAST TECHNIQUE: Multidetector CT imaging of the chest was performed following the standard protocol without IV contrast. COMPARISON:  Most recent CT chest 11/21/2019. FINDINGS: Cardiovascular: Aortic atherosclerosis. Normal heart size. Extensive 3 vessel coronary  artery calcifications and/or stents. No pericardial effusion. Mediastinum/Nodes: No enlarged mediastinal, hilar, or axillary lymph nodes. Thyroid gland, trachea, and esophagus demonstrate no significant findings. Lungs/Pleura: Unchanged post treatment appearance of the right chest with bandlike fibrotic consolidation of the perihilar right upper lobe and superior segment right lower lobe (series 7, image 55). Occasional tiny centrilobular nodules, stable and benign. No pleural effusion or pneumothorax. Upper Abdomen: No acute abnormality. Musculoskeletal: No chest wall mass or suspicious bone lesions identified. IMPRESSION: 1. Unchanged post treatment appearance of the right chest with bandlike fibrotic consolidation of the perihilar right upper lobe and superior segment right lower lobe. No evidence of malignant recurrence. 2. Coronary artery disease. Aortic Atherosclerosis (ICD10-I70.0). Electronically Signed   By: Eddie Candle M.D.   On: 11/24/2020 11:32     ASSESSMENT AND PLAN: This is a very pleasant 86 years old stage IA non-small cell lung cancer presented with right upper  lobe pulmonary nodule status post curative stereotactic body radiotherapy. The patient is currently on observation and she is feeling fine today with no concerning complaints. She had repeat CT scan of the chest performed recently.  I personally and independently reviewed the scans and discussed the results with the patient and her daughter. Her scan showed no concerning findings for disease recurrence or metastasis. I recommended for her to continue on observation with repeat CT scan of the chest in 1 year. She was advised to call immediately if she has any concerning symptoms in the interval. The patient voices understanding of current disease status and treatment options and is in agreement with the current care plan. All questions were answered. The patient knows to call the clinic with any problems, questions or concerns. We can certainly see the patient much sooner if necessary.  Disclaimer: This note was dictated with voice recognition software. Similar sounding words can inadvertently be transcribed and may not be corrected upon review.

## 2021-01-13 DIAGNOSIS — I739 Peripheral vascular disease, unspecified: Secondary | ICD-10-CM | POA: Diagnosis not present

## 2021-01-13 DIAGNOSIS — E1140 Type 2 diabetes mellitus with diabetic neuropathy, unspecified: Secondary | ICD-10-CM | POA: Diagnosis not present

## 2021-01-13 DIAGNOSIS — E1165 Type 2 diabetes mellitus with hyperglycemia: Secondary | ICD-10-CM | POA: Diagnosis not present

## 2021-01-13 DIAGNOSIS — G64 Other disorders of peripheral nervous system: Secondary | ICD-10-CM | POA: Diagnosis not present

## 2021-01-13 DIAGNOSIS — D638 Anemia in other chronic diseases classified elsewhere: Secondary | ICD-10-CM | POA: Diagnosis not present

## 2021-01-13 DIAGNOSIS — C3490 Malignant neoplasm of unspecified part of unspecified bronchus or lung: Secondary | ICD-10-CM | POA: Diagnosis not present

## 2021-01-13 DIAGNOSIS — E559 Vitamin D deficiency, unspecified: Secondary | ICD-10-CM | POA: Diagnosis not present

## 2021-01-13 DIAGNOSIS — Z79899 Other long term (current) drug therapy: Secondary | ICD-10-CM | POA: Diagnosis not present

## 2021-01-13 DIAGNOSIS — Z9181 History of falling: Secondary | ICD-10-CM | POA: Diagnosis not present

## 2021-01-13 DIAGNOSIS — E7800 Pure hypercholesterolemia, unspecified: Secondary | ICD-10-CM | POA: Diagnosis not present

## 2021-01-13 DIAGNOSIS — J4520 Mild intermittent asthma, uncomplicated: Secondary | ICD-10-CM | POA: Diagnosis not present

## 2021-01-13 DIAGNOSIS — J441 Chronic obstructive pulmonary disease with (acute) exacerbation: Secondary | ICD-10-CM | POA: Diagnosis not present

## 2021-02-01 ENCOUNTER — Encounter: Payer: Self-pay | Admitting: Podiatry

## 2021-02-01 ENCOUNTER — Other Ambulatory Visit: Payer: Self-pay

## 2021-02-01 ENCOUNTER — Ambulatory Visit (INDEPENDENT_AMBULATORY_CARE_PROVIDER_SITE_OTHER): Payer: Medicare Other | Admitting: Podiatry

## 2021-02-01 DIAGNOSIS — M2042 Other hammer toe(s) (acquired), left foot: Secondary | ICD-10-CM

## 2021-02-01 DIAGNOSIS — M79674 Pain in right toe(s): Secondary | ICD-10-CM

## 2021-02-01 DIAGNOSIS — E1142 Type 2 diabetes mellitus with diabetic polyneuropathy: Secondary | ICD-10-CM | POA: Diagnosis not present

## 2021-02-01 DIAGNOSIS — I739 Peripheral vascular disease, unspecified: Secondary | ICD-10-CM

## 2021-02-01 DIAGNOSIS — M79675 Pain in left toe(s): Secondary | ICD-10-CM | POA: Diagnosis not present

## 2021-02-01 DIAGNOSIS — L84 Corns and callosities: Secondary | ICD-10-CM | POA: Diagnosis not present

## 2021-02-01 DIAGNOSIS — B351 Tinea unguium: Secondary | ICD-10-CM

## 2021-02-01 DIAGNOSIS — M2041 Other hammer toe(s) (acquired), right foot: Secondary | ICD-10-CM

## 2021-02-03 DIAGNOSIS — I25118 Atherosclerotic heart disease of native coronary artery with other forms of angina pectoris: Secondary | ICD-10-CM | POA: Diagnosis not present

## 2021-02-03 DIAGNOSIS — E785 Hyperlipidemia, unspecified: Secondary | ICD-10-CM | POA: Diagnosis not present

## 2021-02-03 DIAGNOSIS — E119 Type 2 diabetes mellitus without complications: Secondary | ICD-10-CM | POA: Diagnosis not present

## 2021-02-03 DIAGNOSIS — I10 Essential (primary) hypertension: Secondary | ICD-10-CM | POA: Diagnosis not present

## 2021-02-03 DIAGNOSIS — I5020 Unspecified systolic (congestive) heart failure: Secondary | ICD-10-CM | POA: Diagnosis not present

## 2021-02-06 NOTE — Progress Notes (Signed)
Subjective: Erin Carr presents today for at risk foot care with history of diabetic neuropathy and corn(s) right 5th digit and painful thick toenails that are difficult to trim. Painful toenails interfere with ambulation. Aggravating factors include wearing enclosed shoe gear. Pain is relieved with periodic professional debridement. Painful corns are aggravated when weightbearing when wearing enclosed shoe gear. Pain is relieved with periodic professional debridement..  She voices no new pedal problems on today's visit.  Patient did not check blood glucose this morning.  Vincente Liberty, MD is patient's PCP. Last visit was 01/20/2020.  No Known Allergies  Review of Systems: Negative except as noted in the HPI.   Objective: There were no vitals filed for this visit.  Erin Carr is a pleasant 87 y.o. female in NAD. AAO X 3.  Vascular Examination: Capillary fill time to digits <3 seconds b/l lower extremities. DP pulse nonpalpable left foot. DP pulse palpable right foot. PT pulse nonpalpable left foot. PT pulse nonpalpable right foot. Pedal hair absent. Lower extremity skin temperature gradient within normal limits. No pain with calf compression b/l. No ischemia or gangrene noted b/l lower extremities.  Dermatological Examination: Pedal skin with normal turgor, texture and tone bilaterally. No open wounds bilaterally. No interdigital macerations bilaterally. Toenails 1-5 b/l elongated, discolored, dystrophic, thickened, crumbly with subungual debris and tenderness to dorsal palpation. Preulcerative hyperkeratotic lesion right 5th digit PIPJ. Hyperkeratotic lesion(s) R 2nd toe.  No erythema, no edema, no drainage, no fluctuance.  Musculoskeletal Examination: Normal muscle strength 5/5 to all lower extremity muscle groups bilaterally. Dropfoot left lower extremity. Hammertoes noted to the 2-5 bilaterally.  Neurological Examination: Protective sensation diminished with 10g  monofilament b/l. Vibratory sensation diminished b/l. Proprioception intact bilaterally.  Assessment: 1. Pain due to onychomycosis of toenails of both feet   2. Corns   3. Acquired hammertoes of both feet   4. Diabetic peripheral neuropathy associated with type 2 diabetes mellitus ([place])   5. PAD (peripheral artery disease) ([place])      Plan: -Examined patient. -Diabetic foot examination performed on today's visit. -Continue diabetic foot care principles. -Patient to continue soft, supportive shoe gear daily. -Toenails 1-5 b/l were debrided in length and girth with sterile nail nippers and dremel without iatrogenic bleeding.  -Corn(s) R 2nd toe and R 5th toe pared utilizing sterile scalpel blade without complication or incident. Total number debrided=2. -Patient to report any pedal injuries to medical professional immediately. -Patient/POA to call should there be question/concern in the interim.  Return in about 3 months (around 05/04/2021).  Marzetta Board, DPM

## 2021-05-11 ENCOUNTER — Other Ambulatory Visit: Payer: Self-pay

## 2021-05-11 ENCOUNTER — Ambulatory Visit (INDEPENDENT_AMBULATORY_CARE_PROVIDER_SITE_OTHER): Payer: Medicare Other | Admitting: Podiatry

## 2021-05-11 DIAGNOSIS — M79674 Pain in right toe(s): Secondary | ICD-10-CM

## 2021-05-11 DIAGNOSIS — L84 Corns and callosities: Secondary | ICD-10-CM | POA: Diagnosis not present

## 2021-05-11 DIAGNOSIS — B351 Tinea unguium: Secondary | ICD-10-CM | POA: Diagnosis not present

## 2021-05-11 DIAGNOSIS — M79675 Pain in left toe(s): Secondary | ICD-10-CM

## 2021-05-11 DIAGNOSIS — E1142 Type 2 diabetes mellitus with diabetic polyneuropathy: Secondary | ICD-10-CM | POA: Diagnosis not present

## 2021-05-18 ENCOUNTER — Encounter: Payer: Self-pay | Admitting: Podiatry

## 2021-05-18 NOTE — Progress Notes (Signed)
  Subjective:  Patient ID: Erin Carr, female    DOB: 1930-07-06,  MRN: [id number]  87 y.o. female presents with at risk foot care with history of diabetic neuropathy and corn(s) right foot and painful thick toenails that are difficult to trim. Painful toenails interfere with ambulation. Aggravating factors include wearing enclosed shoe gear. Pain is relieved with periodic professional debridement. Painful corns are aggravated when weightbearing when wearing enclosed shoe gear. Pain is relieved with periodic professional debridement..    She notes no new pedal problems on today's visit.  PCP: Vincente Liberty, MD and last visit was: 01/13/2021.  Review of Systems: Negative except as noted in the HPI.   No Known Allergies  Objective:  There were no vitals filed for this visit. Constitutional Patient is a pleasant 87 y.o. African American female in NAD. AAO x 3.  Vascular Capillary fill time to digits <3 seconds b/l lower extremities. Palpable DP pulse(s) right foot Nonpalpable DP pulse(s) left foot. Nonpalpable PT pulse(s) b/l feet. Pedal hair absent. Lower extremity skin temperature gradient within normal limits. No pain with calf compression b/l. No cyanosis or clubbing noted.  Neurologic Normal speech. Protective sensation diminished with 10g monofilament b/l.  Dermatologic Pedal skin with normal turgor, texture and tone bilaterally. No open wounds bilaterally. No interdigital macerations bilaterally. Toenails 1-5 b/l elongated, discolored, dystrophic, thickened, crumbly with subungual debris and tenderness to dorsal palpation. Hyperkeratotic lesion(s) lateral aspect left heel, R 2nd toe, and R 5th toe.  No erythema, no edema, no drainage, no fluctuance.  Orthopedic: Muscle strength 5/5 to all LE muscle groups of left foot. Dropfoot right foot. Wearing AFO on right lower extremity. No pain crepitus or joint limitation noted with ROM b/l. Hammertoe(s) noted to the 2-5 bilaterally.     Assessment:   1. Pain due to onychomycosis of toenails of both feet   2. Corns and callosities   3. Diabetic peripheral neuropathy associated with type 2 diabetes mellitus ([place])    Plan:  Patient was evaluated and treated and all questions answered.  Onychomycosis with pain -Nails palliatively debridement as below. -Educated on self-care  Procedure: Nail Debridement Rationale: Pain Type of Debridement: manual, sharp debridement. Instrumentation: Nail nipper, rotary burr. Number of Nails: 10  -Examined patient. -Patient to continue soft, supportive shoe gear daily. -Toenails 1-5 b/l were debrided in length and girth with sterile nail nippers and dremel without iatrogenic bleeding.  -Corn(s) R 2nd toe and R 5th toe and callus(es) left heel were pared utilizing sterile scalpel blade without incident. Total number debrided =3. -Patient to report any pedal injuries to medical professional immediately. -Patient/POA to call should there be question/concern in the interim.  Return in about 3 months (around 08/11/2021) for diabetic nail trim.  Marzetta Board, DPM

## 2021-05-24 DIAGNOSIS — Z9981 Dependence on supplemental oxygen: Secondary | ICD-10-CM | POA: Diagnosis not present

## 2021-05-24 DIAGNOSIS — D638 Anemia in other chronic diseases classified elsewhere: Secondary | ICD-10-CM | POA: Diagnosis not present

## 2021-05-24 DIAGNOSIS — E1140 Type 2 diabetes mellitus with diabetic neuropathy, unspecified: Secondary | ICD-10-CM | POA: Diagnosis not present

## 2021-05-24 DIAGNOSIS — J4520 Mild intermittent asthma, uncomplicated: Secondary | ICD-10-CM | POA: Diagnosis not present

## 2021-05-24 DIAGNOSIS — Z79899 Other long term (current) drug therapy: Secondary | ICD-10-CM | POA: Diagnosis not present

## 2021-05-24 DIAGNOSIS — I739 Peripheral vascular disease, unspecified: Secondary | ICD-10-CM | POA: Diagnosis not present

## 2021-05-24 DIAGNOSIS — E1165 Type 2 diabetes mellitus with hyperglycemia: Secondary | ICD-10-CM | POA: Diagnosis not present

## 2021-05-24 DIAGNOSIS — C3490 Malignant neoplasm of unspecified part of unspecified bronchus or lung: Secondary | ICD-10-CM | POA: Diagnosis not present

## 2021-05-24 DIAGNOSIS — J441 Chronic obstructive pulmonary disease with (acute) exacerbation: Secondary | ICD-10-CM | POA: Diagnosis not present

## 2021-05-24 DIAGNOSIS — E559 Vitamin D deficiency, unspecified: Secondary | ICD-10-CM | POA: Diagnosis not present

## 2021-05-24 DIAGNOSIS — E7800 Pure hypercholesterolemia, unspecified: Secondary | ICD-10-CM | POA: Diagnosis not present

## 2021-05-24 DIAGNOSIS — G64 Other disorders of peripheral nervous system: Secondary | ICD-10-CM | POA: Diagnosis not present

## 2021-06-07 ENCOUNTER — Ambulatory Visit (HOSPITAL_COMMUNITY)
Admission: EM | Admit: 2021-06-07 | Discharge: 2021-06-07 | Payer: Medicare Other | Attending: Student | Admitting: Student

## 2021-06-07 ENCOUNTER — Encounter (HOSPITAL_COMMUNITY): Payer: Self-pay

## 2021-06-07 ENCOUNTER — Emergency Department (HOSPITAL_COMMUNITY): Payer: Medicare Other

## 2021-06-07 ENCOUNTER — Other Ambulatory Visit: Payer: Self-pay

## 2021-06-07 ENCOUNTER — Inpatient Hospital Stay (HOSPITAL_COMMUNITY)
Admission: EM | Admit: 2021-06-07 | Discharge: 2021-06-20 | DRG: 689 | Disposition: A | Discharge status: Skilled Nursing Facility | Payer: Medicare Other | Attending: Internal Medicine | Admitting: Internal Medicine

## 2021-06-07 DIAGNOSIS — J329 Chronic sinusitis, unspecified: Secondary | ICD-10-CM | POA: Diagnosis not present

## 2021-06-07 DIAGNOSIS — I672 Cerebral atherosclerosis: Secondary | ICD-10-CM | POA: Diagnosis not present

## 2021-06-07 DIAGNOSIS — Z923 Personal history of irradiation: Secondary | ICD-10-CM

## 2021-06-07 DIAGNOSIS — M810 Age-related osteoporosis without current pathological fracture: Secondary | ICD-10-CM | POA: Diagnosis present

## 2021-06-07 DIAGNOSIS — E559 Vitamin D deficiency, unspecified: Secondary | ICD-10-CM | POA: Diagnosis present

## 2021-06-07 DIAGNOSIS — Z515 Encounter for palliative care: Secondary | ICD-10-CM | POA: Diagnosis not present

## 2021-06-07 DIAGNOSIS — I10 Essential (primary) hypertension: Secondary | ICD-10-CM | POA: Diagnosis not present

## 2021-06-07 DIAGNOSIS — Z90710 Acquired absence of both cervix and uterus: Secondary | ICD-10-CM

## 2021-06-07 DIAGNOSIS — N1832 Chronic kidney disease, stage 3b: Secondary | ICD-10-CM | POA: Diagnosis present

## 2021-06-07 DIAGNOSIS — R32 Unspecified urinary incontinence: Secondary | ICD-10-CM | POA: Diagnosis present

## 2021-06-07 DIAGNOSIS — N390 Urinary tract infection, site not specified: Secondary | ICD-10-CM | POA: Diagnosis present

## 2021-06-07 DIAGNOSIS — N3000 Acute cystitis without hematuria: Secondary | ICD-10-CM

## 2021-06-07 DIAGNOSIS — Z833 Family history of diabetes mellitus: Secondary | ICD-10-CM

## 2021-06-07 DIAGNOSIS — D696 Thrombocytopenia, unspecified: Secondary | ICD-10-CM | POA: Diagnosis not present

## 2021-06-07 DIAGNOSIS — E119 Type 2 diabetes mellitus without complications: Secondary | ICD-10-CM

## 2021-06-07 DIAGNOSIS — U071 COVID-19: Secondary | ICD-10-CM | POA: Diagnosis not present

## 2021-06-07 DIAGNOSIS — Z7984 Long term (current) use of oral hypoglycemic drugs: Secondary | ICD-10-CM

## 2021-06-07 DIAGNOSIS — R531 Weakness: Secondary | ICD-10-CM | POA: Diagnosis not present

## 2021-06-07 DIAGNOSIS — I130 Hypertensive heart and chronic kidney disease with heart failure and stage 1 through stage 4 chronic kidney disease, or unspecified chronic kidney disease: Secondary | ICD-10-CM | POA: Diagnosis present

## 2021-06-07 DIAGNOSIS — N179 Acute kidney failure, unspecified: Secondary | ICD-10-CM | POA: Diagnosis not present

## 2021-06-07 DIAGNOSIS — C3490 Malignant neoplasm of unspecified part of unspecified bronchus or lung: Secondary | ICD-10-CM | POA: Diagnosis not present

## 2021-06-07 DIAGNOSIS — J449 Chronic obstructive pulmonary disease, unspecified: Secondary | ICD-10-CM | POA: Diagnosis present

## 2021-06-07 DIAGNOSIS — E1142 Type 2 diabetes mellitus with diabetic polyneuropathy: Secondary | ICD-10-CM | POA: Diagnosis present

## 2021-06-07 DIAGNOSIS — Z8673 Personal history of transient ischemic attack (TIA), and cerebral infarction without residual deficits: Secondary | ICD-10-CM | POA: Diagnosis not present

## 2021-06-07 DIAGNOSIS — I959 Hypotension, unspecified: Secondary | ICD-10-CM

## 2021-06-07 DIAGNOSIS — Z85118 Personal history of other malignant neoplasm of bronchus and lung: Secondary | ICD-10-CM | POA: Diagnosis not present

## 2021-06-07 DIAGNOSIS — Z7982 Long term (current) use of aspirin: Secondary | ICD-10-CM

## 2021-06-07 DIAGNOSIS — R29898 Other symptoms and signs involving the musculoskeletal system: Secondary | ICD-10-CM | POA: Diagnosis not present

## 2021-06-07 DIAGNOSIS — Z79899 Other long term (current) drug therapy: Secondary | ICD-10-CM

## 2021-06-07 DIAGNOSIS — E1122 Type 2 diabetes mellitus with diabetic chronic kidney disease: Secondary | ICD-10-CM | POA: Diagnosis present

## 2021-06-07 DIAGNOSIS — M21371 Foot drop, right foot: Secondary | ICD-10-CM | POA: Diagnosis present

## 2021-06-07 DIAGNOSIS — E1165 Type 2 diabetes mellitus with hyperglycemia: Secondary | ICD-10-CM | POA: Diagnosis present

## 2021-06-07 DIAGNOSIS — I5022 Chronic systolic (congestive) heart failure: Secondary | ICD-10-CM | POA: Diagnosis not present

## 2021-06-07 DIAGNOSIS — Z8249 Family history of ischemic heart disease and other diseases of the circulatory system: Secondary | ICD-10-CM

## 2021-06-07 DIAGNOSIS — E7800 Pure hypercholesterolemia, unspecified: Secondary | ICD-10-CM | POA: Diagnosis present

## 2021-06-07 DIAGNOSIS — D72819 Decreased white blood cell count, unspecified: Secondary | ICD-10-CM | POA: Diagnosis present

## 2021-06-07 DIAGNOSIS — I252 Old myocardial infarction: Secondary | ICD-10-CM

## 2021-06-07 DIAGNOSIS — I5042 Chronic combined systolic (congestive) and diastolic (congestive) heart failure: Secondary | ICD-10-CM | POA: Diagnosis present

## 2021-06-07 DIAGNOSIS — Z7189 Other specified counseling: Secondary | ICD-10-CM

## 2021-06-07 DIAGNOSIS — H409 Unspecified glaucoma: Secondary | ICD-10-CM | POA: Diagnosis present

## 2021-06-07 LAB — CBG MONITORING, ED
Glucose-Capillary: 181 mg/dL — ABNORMAL HIGH (ref 70–99)
Glucose-Capillary: 115 mg/dL — ABNORMAL HIGH (ref 70–99)

## 2021-06-07 LAB — URINALYSIS, ROUTINE W REFLEX MICROSCOPIC
Bilirubin Urine: NEGATIVE
Glucose, UA: NEGATIVE mg/dL
Ketones, ur: NEGATIVE mg/dL
Nitrite: NEGATIVE
Protein, ur: 30 mg/dL — AB
Specific Gravity, Urine: 1.01 (ref 1.005–1.030)
WBC, UA: >50 WBC/hpf — ABNORMAL HIGH (ref 0–5)
pH: 7 (ref 5.0–8.0)

## 2021-06-07 LAB — CBC WITH DIFFERENTIAL/PLATELET
Abs Immature Granulocytes: 0.03 10*3/uL (ref 0.00–0.07)
Basophils Absolute: 0 10*3/uL (ref 0.0–0.1)
Basophils Relative: 0 %
Eosinophils Absolute: 0.1 10*3/uL (ref 0.0–0.5)
Eosinophils Relative: 1 %
HCT: 35.8 % — ABNORMAL LOW (ref 36.0–46.0)
Hemoglobin: 10.9 g/dL — ABNORMAL LOW (ref 12.0–15.0)
Immature Granulocytes: 0 %
Lymphocytes Relative: 16 %
Lymphs Abs: 1.1 10*3/uL (ref 0.7–4.0)
MCH: 26.8 pg (ref 26.0–34.0)
MCHC: 30.4 g/dL (ref 30.0–36.0)
MCV: 88.2 fL (ref 80.0–100.0)
Monocytes Absolute: 1.4 10*3/uL — ABNORMAL HIGH (ref 0.1–1.0)
Monocytes Relative: 21 %
Neutro Abs: 4.2 10*3/uL (ref 1.7–7.7)
Neutrophils Relative %: 62 %
Platelets: 168 10*3/uL (ref 150–400)
RBC: 4.06 MIL/uL (ref 3.87–5.11)
RDW: 14.2 % (ref 11.5–15.5)
WBC: 6.9 10*3/uL (ref 4.0–10.5)
nRBC: 0 % (ref 0.0–0.2)

## 2021-06-07 LAB — COMPREHENSIVE METABOLIC PANEL
ALT: 21 U/L (ref 0–44)
AST: 30 U/L (ref 15–41)
Albumin: 4.1 g/dL (ref 3.5–5.0)
Alkaline Phosphatase: 67 U/L (ref 38–126)
Anion gap: 10 (ref 5–15)
BUN: 29 mg/dL — ABNORMAL HIGH (ref 8–23)
CO2: 27 mmol/L (ref 22–32)
Calcium: 9.5 mg/dL (ref 8.9–10.3)
Chloride: 101 mmol/L (ref 98–111)
Creatinine, Ser: 1.46 mg/dL — ABNORMAL HIGH (ref 0.44–1.00)
GFR, Estimated: 34 mL/min — ABNORMAL LOW (ref >60)
Glucose, Bld: 141 mg/dL — ABNORMAL HIGH (ref 70–99)
Potassium: 4.2 mmol/L (ref 3.5–5.1)
Sodium: 138 mmol/L (ref 135–145)
Total Bilirubin: 0.5 mg/dL (ref 0.3–1.2)
Total Protein: 7.4 g/dL (ref 6.5–8.1)

## 2021-06-07 MED ORDER — ENOXAPARIN SODIUM 40 MG/0.4ML IJ SOSY
40.0000 mg | PREFILLED_SYRINGE | INTRAMUSCULAR | Status: DC
  Administered 2021-06-08 – 2021-06-16 (×9): 40 mg via SUBCUTANEOUS
  Filled 2021-06-07 (×9): qty 0.4

## 2021-06-07 MED ORDER — SODIUM CHLORIDE 0.9 % IV SOLN
1.0000 g | Freq: Once | INTRAVENOUS | Status: AC
  Administered 2021-06-08: 1 g via INTRAVENOUS
  Filled 2021-06-07: qty 10

## 2021-06-07 MED ORDER — LACTATED RINGERS IV SOLN: INTRAVENOUS | Status: AC

## 2021-06-07 MED ORDER — SODIUM CHLORIDE 0.9 % IV SOLN
1.0000 g | INTRAVENOUS | Status: DC
  Administered 2021-06-08 – 2021-06-11 (×3): 1 g via INTRAVENOUS
  Filled 2021-06-07 (×3): qty 1

## 2021-06-07 NOTE — ED Notes (Signed)
Patient is being discharged from the Urgent Care and sent to the Emergency Department via personal vehicle with family . Per Provider Marin Roberts, patient is in need of higher level of care due to needing further evaluation for extremity weakness. Patient is aware and verbalizes understanding of plan of care.   Vitals:   06/07/21 1607  BP: (!) 109/45  Pulse: 60  Resp: 16  Temp: 97.9 F (36.6 C)  SpO2: 100%

## 2021-06-07 NOTE — ED Provider Notes (Signed)
[place] DEPT Provider Note   CSN: [id number] Arrival date & time: 06/07/21  1703     History Chief Complaint  Patient presents with   Weakness    Erin Carr is a 87 y.o. female.  Patient accompanied by her daughter.  Patient normally able to walk with a walker.  That but today when she got up her legs were just weak and could not support her weight.  No fall or injury.  Patient was taken to urgent care.  Urgent care note raises concerns for cauda equina.  Patient brought here by family members for further evaluation.  Patient's past medical history significant for COPD diabetes hypertension and neoplasm of uncertain behavior of her right upper lobe of her lung.  Patient denies any chest pain any headache any shortness of breath any abdominal pain nausea or vomiting.  No difficulty with speech and denies any numbness.  Patient's had a stroke in the past and has a brace on her right foot.      Past Medical History:  Diagnosis Date   Adenocarcinoma of right lung, stage 1 ([place]) 11/16/2016   Arthritis    Asthma    COPD (chronic obstructive pulmonary disease) ([place])    Diabetes mellitus without complication ([place])    Type II   Family history of adverse reaction to anesthesia    "aunt had PONV"   History of radiation therapy 12/19/16-12/27/16   SBRT to right upper lobe 54 Gy in 3 fractions   Hypercholesteremia    Hypertension    Osteoporosis    Positive PPD 12/16   PER PCP   Shortness of breath dyspnea    Tremors of nervous system    Left hand   Urinary frequency    Vitamin D deficiency    Wears glasses     Patient Active Problem List   Diagnosis Date Noted   Overweight with body mass index (BMI) 25.0-29.9 04/03/2018   History of cystocele 04/03/2018   Candidiasis of vagina 04/03/2018   Diabetes mellitus ([place]) 04/03/2018   Hyperlipidemia 04/03/2018   Hypertensive disorder 04/03/2018   Postmenopausal bleeding 04/03/2018   Urinary  incontinence 04/03/2018   Acute CHF (congestive heart failure) ([place]) 10/22/2017   Foot drop 10/22/2017   Facial droop 10/22/2017   NSTEMI (non-ST elevated myocardial infarction) ([place]) 10/22/2017   Fall 10/22/2017   Leukocytosis 10/22/2017   Radiation pneumonitis ([place]) 10/22/2017   Foot drop, right    Shortness of breath 08/09/2017   Microcytic anemia 07/18/2017   CAP (community acquired pneumonia) 07/18/2017   Asthma, chronic, unspecified asthma severity, with acute exacerbation 07/18/2017   Type 2 diabetes mellitus without complication ([place]) 14/78/2956   Adenocarcinoma of right lung, stage 1 ([place]) 11/16/2016   Neoplasm of uncertain behavior of right upper lobe of lung 12/01/2015   Positive PPD    Pelvic prolapse 12/16/2014    Past Surgical History:  Procedure Laterality Date   ABDOMINAL HYSTERECTOMY     ANTERIOR AND POSTERIOR REPAIR N/A 12/16/2014   Procedure: ANTERIOR (CYSTOCELE) AND POSTERIOR REPAIR (RECTOCELE);  Surgeon: Crawford Givens, MD;  Location: [facility];  Service: Gynecology;  Laterality: N/A;   APPENDECTOMY     BOWEL RESECTION     BREAST BIOPSY Left    COLONOSCOPY     VIDEO BRONCHOSCOPY WITH ENDOBRONCHIAL ULTRASOUND N/A 12/17/2015   Procedure: VIDEO BRONCHOSCOPY WITH ENDOBRONCHIAL ULTRASOUND;  Surgeon: Ivin Poot, MD;  Location: [facility];  Service: Thoracic;  Laterality: N/A;  OB History   No obstetric history on file.     Family History  Problem Relation Age of Onset   Hypertension Father    Diabetes Father     Social History   Tobacco Use   Smoking status: Former    Packs/day: 0.50    Years: 40.00    Pack years: 20.00    Types: Cigarettes    Quit date: 12/08/2001    Years since quitting: 19.5   Smokeless tobacco: Never   Tobacco comments:    quit about 15-16 years ago; 12/09/2015  Substance Use Topics   Alcohol use: No   Drug use: No    Home Medications Prior to Admission medications   Medication Sig Start Date End Date Taking? Authorizing  Provider  albuterol (PROVENTIL HFA;VENTOLIN HFA) 108 (90 Base) MCG/ACT inhaler Inhale 1 puff into the lungs every 4 (four) hours as needed for wheezing or shortness of breath.    [provider]  ALPHAGAN P 0.1 % SOLN  03/16/20   [provider]  amLODipine (NORVASC) 10 MG tablet  10/12/20   [provider]  aspirin EC 81 MG tablet Take 81 mg by mouth daily.    [provider]  atorvastatin (LIPITOR) 40 MG tablet Take 1 tablet (40 mg total) by mouth daily at 6 PM. 10/25/17   Danford, Suann Larry, MD  ergocalciferol (VITAMIN D2) 50000 units capsule Take 50,000 Units by mouth once a week. On Monday    [provider]  ferrous sulfate 325 (65 FE) MG tablet Take 1 tablet (325 mg total) by mouth daily. Patient taking differently: Take 325 mg by mouth 3 (three) times a week.  07/18/17   Doristine Devoid, PA-C  furosemide (LASIX) 20 MG tablet Take 3 tablets (60 mg total) 2 (two) times daily by mouth. 10/09/17   Caccavale, Sophia, PA-C  gabapentin (NEURONTIN) 400 MG capsule Take 400 mg by mouth 2 (two) times daily.    [provider]  glipiZIDE (GLUCOTROL XL) 5 MG 24 hr tablet  11/16/20   [provider]  latanoprost (XALATAN) 0.005 % ophthalmic solution  10/16/20   [provider]  lisinopril (PRINIVIL,ZESTRIL) 20 MG tablet Take 20 mg by mouth 2 (two) times daily.    [provider]  LUMIGAN 0.01 % SOLN  03/16/20   [provider]  metFORMIN (GLUCOPHAGE-XR) 500 MG 24 hr tablet Take 500 mg by mouth once. 06/20/17   [provider]  metoprolol tartrate (LOPRESSOR) 25 MG tablet Take 0.5 tablets (12.5 mg total) by mouth 2 (two) times daily. Patient taking differently: Take 25 mg by mouth 2 (two) times daily.  10/25/17   Danford, Suann Larry, MD  potassium chloride (K-DUR) 10 MEQ tablet Take 10 mEq by mouth daily.    [provider]    Allergies    Patient has no known allergies.  Review of  Systems   Review of Systems  Constitutional:  Negative for chills and fever.  HENT:  Negative for ear pain and sore throat.   Eyes:  Negative for pain and visual disturbance.  Respiratory:  Negative for cough and shortness of breath.   Cardiovascular:  Negative for chest pain and palpitations.  Gastrointestinal:  Negative for abdominal pain and vomiting.  Genitourinary:  Negative for dysuria and hematuria.  Musculoskeletal:  Negative for arthralgias and back pain.  Skin:  Negative for color change and rash.  Neurological:  Positive for tremors and weakness. Negative for seizures and syncope.  All other systems reviewed and are negative.  Physical Exam Updated Vital Signs BP (!) 141/51   Pulse 90   Temp 98.8 F (37.1 C) (Oral)   Resp 16   SpO2 93%   Physical Exam Vitals and nursing note reviewed.  Constitutional:      General: She is not in acute distress.    Appearance: She is well-developed.  HENT:     Head: Normocephalic and atraumatic.  Eyes:     Extraocular Movements: Extraocular movements intact.     Conjunctiva/sclera: Conjunctivae normal.     Pupils: Pupils are equal, round, and reactive to light.  Cardiovascular:     Rate and Rhythm: Normal rate and regular rhythm.     Heart sounds: No murmur heard. Pulmonary:     Effort: Pulmonary effort is normal. No respiratory distress.     Breath sounds: Normal breath sounds.  Abdominal:     Palpations: Abdomen is soft.     Tenderness: There is no abdominal tenderness.  Musculoskeletal:        General: No swelling, tenderness, deformity or signs of injury.     Cervical back: Normal range of motion and neck supple. No rigidity.  Skin:    General: Skin is warm and dry.  Neurological:     General: No focal deficit present.     Mental Status: She is alert and oriented to person, place, and time.     Cranial Nerves: No cranial nerve deficit.     Motor: No weakness.     Comments: Patient lying down is able to raise both  legs and hold them up.  Patient does have a brace on her right foot presumably to prevent foot drop.  Patient can raise both arms there is a bit of a tremor.  She can close her eyes and hold her arms there.  No cranial nerve deficits.    ED Results / Procedures / Treatments   Labs (all labs ordered are listed, but only abnormal results are displayed) Labs Reviewed  COMPREHENSIVE METABOLIC PANEL - Abnormal; Notable for the following components:      Result Value   Glucose, Bld 141 (*)    BUN 29 (*)    Creatinine, Ser 1.46 (*)    GFR, Estimated 34 (*)    All other components within normal limits  CBC WITH DIFFERENTIAL/PLATELET - Abnormal; Notable for the following components:   Hemoglobin 10.9 (*)    HCT 35.8 (*)    Monocytes Absolute 1.4 (*)    All other components within normal limits  CBG MONITORING, ED - Abnormal; Notable for the following components:   Glucose-Capillary 115 (*)    All other components within normal limits  URINE CULTURE  URINALYSIS, ROUTINE W REFLEX MICROSCOPIC    EKG None  Radiology DG Chest 2 View  Result Date: 06/07/2021 CLINICAL DATA:  Weakness history of right lung cancer EXAM: CHEST - 2 VIEW COMPARISON:  10/22/2017, CT chest 11/24/2020 FINDINGS: Bandlike opacity in the right upper lobe consistent with post treatment change. No acute airspace disease or effusion. Normal cardiomediastinal silhouette with aortic atherosclerosis. No pneumothorax IMPRESSION: No active cardiopulmonary disease. Bandlike opacity in the right upper lobe consistent with post treatment changes. Electronically Signed   By: Donavan Foil M.D.   On: 06/07/2021 19:16   CT Head Wo Contrast  Result Date: 06/07/2021 CLINICAL DATA:  Leg weakness.  Stroke suspected.  History of stroke. EXAM: CT HEAD WITHOUT CONTRAST TECHNIQUE: Contiguous axial images were obtained from  the base of the skull through the vertex without intravenous contrast. COMPARISON:  CT 10/22/2017.  MRI brain 10/22/2017  FINDINGS: Brain: No evidence of acute infarction, hemorrhage, hydrocephalus, extra-axial collection or mass lesion/mass effect. Mild diffuse cerebral atrophy. Mild ventricular dilatation consistent with central atrophy. Patchy low-attenuation changes in the deep white matter consistent small vessel ischemia. Vascular: Moderate intracranial arterial vascular calcifications. Skull: Calvarium appears intact. Sinuses/Orbits: Mucosal thickening in the paranasal sinuses. Possible air-fluid level in the left maxillary antrum. Mastoid air cells are clear. Other: None. IMPRESSION: 1. No acute intracranial abnormalities. Chronic atrophy and small vessel ischemic changes. 2. Inflammatory changes in the paranasal sinuses. Electronically Signed   By: Lucienne Capers M.D.   On: 06/07/2021 19:30    Procedures Procedures   Medications Ordered in ED Medications - No data to display  ED Course  I have reviewed the triage vital signs and the nursing notes.  Pertinent labs & imaging results that were available during my care of the patient were reviewed by me and considered in my medical decision making (see chart for details).    MDM Rules/Calculators/A&P                          Work-up head CT without any acute findings.  COVID and flu panel negative.  Urinalysis consistent with urinary tract infection.  This may be the cause of her weakness in her legs.  On neuro exam no focal findings.  Electrolytes without significant changes BUN 29 creatinine 1.46 for a GFR 34.  CBC without any leukocytosis.  Hemoglobin at 10.9.  Chest x-ray without any acute findings.  Urine sent for culture.  Patient will receive 1 g of Rocephin.  This may be the cause of her weakness with standing she normally use has to use a walker anyway so her getting around is in the best on a good day.  But this is significantly worse today.  Hospitalist will admit.  If patient's symptoms do not improve may require MRI brain. Final Clinical  Impression(s) / ED Diagnoses Final diagnoses:  None    Rx / DC Orders ED Discharge Orders     None        Fredia Sorrow, MD 06/08/21 [phone]

## 2021-06-07 NOTE — ED Notes (Signed)
Spoke to patient in the lobby.  Family member with patient.  Patient woke this morning unable to stand.  Patient has weakness in legs .  Patient answers appropriately.  Patient is moving both legs, tapping right foot.  Bilateral hand grips are equal and strong.  Notified laura, pa

## 2021-06-07 NOTE — ED Provider Notes (Signed)
Emergency Medicine Provider Triage Evaluation Note  Erin Carr , a 87 y.o. female  was evaluated in triage.  Pt complains of underlies weakness.  Reports weakness to legs and trouble ambulating on her own.  Daughter has had to help her perform her ADLs today.  Sent from urgent care for further evaluation.  She denies any numbness, saddle anesthesia, injuries or falls, headache, chest pain.  Her blood sugars have been running high.  Review of Systems  Positive: Generalized weakness Negative: Chest pain, abdominal pain  Physical Exam  BP (!) 117/92 (BP Location: Left Arm)   Pulse (!) 129   Temp 98.8 F (37.1 C) (Oral)   Resp 18   SpO2 96%  Gen:   Awake, no distress   Resp:  Normal effort  MSK:   Moves extremities without difficulty  Other:  Normal sensation to light touch of bilateral lower extremities.  Moving all extremities.  Equal strength bilateral lower extremity  Medical Decision Making  Medically screening exam initiated at 5:53 PM.  Appropriate orders placed.  ALEK PONCEDELEON was informed that the remainder of the evaluation will be completed by another provider, this initial triage assessment does not replace that evaluation, and the importance of remaining in the ED until their evaluation is complete.  Lab work ordered   Delia Heady, PA-C 06/07/21 1754    Fredia Sorrow, MD 06/07/21 2218

## 2021-06-07 NOTE — H&P (Signed)
History and Physical    Erin Carr UXL:[id number] DOB: 1930-05-04 DOA: 06/07/2021  PCP: Vincente Liberty, MD  Patient coming from: Home  I have personally briefly reviewed patient's old medical records in [place]  Chief Complaint: LE weakness  HPI: Erin Carr is a 87 y.o. female with medical history significant for CVA with right LE foot drop, CHF, NSTEMI, NSCLC of the right lung s/p radiotherapy in remission, hypertension, asthma, type 2 diabetes, anemia and hyperlipidemia who presents with new onset lower extremity weakness.  Patient lives alone but niece who lives next-door to her is also present at bedside and helps provide some history.  Reportedly patient was otherwise in her normal state of health but this morning could not get out of bed due to weakness of her lower extremity.  She normally is able to ambulate on a walker and wears a right leg brace from foot drop due to previous CVA.  She required assistance from family member to put her in wheelchair to use the bathroom and to bring her to urgent care today.  She had urinated herself in the bed since she was not able to make it in time to her bedside commode.  She denies any history of incontinence.  No dysuria, urgency or frequency.  No history of recurrent UTIs. No history of recent trauma or falls.  No history of back or hip issues.  No saddle anesthesia. No other symptoms or focal extremity weakness.  No facial asymmetry or slurred speech. Denies any nausea, vomiting or diarrhea.  No abdominal pain.  No fever.  Patient presented to urgent care with these lower extremity weakness symptoms today and was sent to ED for further eval.  ED Course: She was afebrile and normotensive on room air.  No leukocytosis.  Hemoglobin of 10.9 from prior of 12.  Elevated creatinine of 1.46.  BG of 115.  CT head was negative. UA shows large leukocyte and negative nitrite.  Review of Systems: Constitutional: No Weight Change,  No Fever ENT/Mouth: No sore throat, No Rhinorrhea Eyes: No Eye Pain, No Vision Changes Cardiovascular: No Chest Pain, no SOB Respiratory: No Cough, No Sputum Gastrointestinal: No Nausea, No Vomiting, No Diarrhea, No Constipation, No Pain Genitourinary: no Urinary Incontinence, No Urgency, No Flank Pain Musculoskeletal: No Arthralgias, No Myalgias Skin: No Skin Lesions, No Pruritus, Neuro: + Weakness, No Numbness Psych: No Anxiety/Panic, No Depression, no decrease appetite Heme/Lymph: No Bruising, No Bleeding  Past Medical History:  Diagnosis Date   Adenocarcinoma of right lung, stage 1 ([place]) 11/16/2016   Arthritis    Asthma    COPD (chronic obstructive pulmonary disease) (HCC)    Diabetes mellitus without complication (HCC)    Type II   Family history of adverse reaction to anesthesia    "aunt had PONV"   History of radiation therapy 12/19/16-12/27/16   SBRT to right upper lobe 54 Gy in 3 fractions   Hypercholesteremia    Hypertension    Osteoporosis    Positive PPD 12/16   PER PCP   Shortness of breath dyspnea    Tremors of nervous system    Left hand   Urinary frequency    Vitamin D deficiency    Wears glasses     Past Surgical History:  Procedure Laterality Date   ABDOMINAL HYSTERECTOMY     ANTERIOR AND POSTERIOR REPAIR N/A 12/16/2014   Procedure: ANTERIOR (CYSTOCELE) AND POSTERIOR REPAIR (RECTOCELE);  Surgeon: Crawford Givens, MD;  Location: [facility];  Service:  Gynecology;  Laterality: N/A;   APPENDECTOMY     BOWEL RESECTION     BREAST BIOPSY Left    COLONOSCOPY     VIDEO BRONCHOSCOPY WITH ENDOBRONCHIAL ULTRASOUND N/A 12/17/2015   Procedure: VIDEO BRONCHOSCOPY WITH ENDOBRONCHIAL ULTRASOUND;  Surgeon: Ivin Poot, MD;  Location: [facility];  Service: Thoracic;  Laterality: N/A;     reports that she quit smoking about 19 years ago. Her smoking use included cigarettes. She has a 20.00 pack-year smoking history. She has never used smokeless tobacco. She reports that she does  not drink alcohol and does not use drugs. Social History  No Known Allergies  Family History  Problem Relation Age of Onset   Hypertension Father    Diabetes Father      Prior to Admission medications   Medication Sig Start Date End Date Taking? Authorizing Provider  albuterol (PROVENTIL HFA;VENTOLIN HFA) 108 (90 Base) MCG/ACT inhaler Inhale 1 puff into the lungs every 4 (four) hours as needed for wheezing or shortness of breath.    [provider]  ALPHAGAN P 0.1 % SOLN  03/16/20   [provider]  amLODipine (NORVASC) 10 MG tablet  10/12/20   [provider]  aspirin EC 81 MG tablet Take 81 mg by mouth daily.    [provider]  atorvastatin (LIPITOR) 40 MG tablet Take 1 tablet (40 mg total) by mouth daily at 6 PM. 10/25/17   Danford, Suann Larry, MD  ergocalciferol (VITAMIN D2) 50000 units capsule Take 50,000 Units by mouth once a week. On Monday    [provider]  ferrous sulfate 325 (65 FE) MG tablet Take 1 tablet (325 mg total) by mouth daily. Patient taking differently: Take 325 mg by mouth 3 (three) times a week.  07/18/17   Doristine Devoid, PA-C  furosemide (LASIX) 20 MG tablet Take 3 tablets (60 mg total) 2 (two) times daily by mouth. 10/09/17   Caccavale, Sophia, PA-C  gabapentin (NEURONTIN) 400 MG capsule Take 400 mg by mouth 2 (two) times daily.    [provider]  glipiZIDE (GLUCOTROL XL) 5 MG 24 hr tablet  11/16/20   [provider]  latanoprost (XALATAN) 0.005 % ophthalmic solution  10/16/20   [provider]  lisinopril (PRINIVIL,ZESTRIL) 20 MG tablet Take 20 mg by mouth 2 (two) times daily.    [provider]  LUMIGAN 0.01 % SOLN  03/16/20   [provider]  metFORMIN (GLUCOPHAGE-XR) 500 MG 24 hr tablet Take 500 mg by mouth once. 06/20/17   [provider]  metoprolol tartrate (LOPRESSOR) 25 MG tablet Take 0.5 tablets (12.5 mg total) by mouth 2 (two) times daily. Patient  taking differently: Take 25 mg by mouth 2 (two) times daily.  10/25/17   Danford, Suann Larry, MD  potassium chloride (K-DUR) 10 MEQ tablet Take 10 mEq by mouth daily.    [provider]    Physical Exam: Vitals:   06/07/21 2045 06/07/21 2130 06/07/21 2230 06/07/21 2330  BP: (!) 147/63 (!) 141/51 (!) 147/67 (!) 138/54  Pulse: 80 90 73 60  Resp: 17 16 19 17   Temp:      TempSrc:      SpO2: 98% 93% 97% 93%    Constitutional: NAD, calm, comfortable, thin nontoxic, well-appearing elderly female laying flat in bed Vitals:   06/07/21 2045 06/07/21 2130 06/07/21 2230 06/07/21 2330  BP: (!) 147/63 (!) 141/51 (!) 147/67 (!) 138/54  Pulse: 80 90 73 60  Resp:  17 16 19 17   Temp:      TempSrc:      SpO2: 98% 93% 97% 93%   Eyes: PERRL, lids and conjunctivae normal ENMT: Mucous membranes are moist.  Neck: normal, supple Respiratory: clear to auscultation bilaterally, no wheezing, no crackles. Normal respiratory effort. No accessory muscle use.  Cardiovascular: Regular rate and rhythm, no murmurs / rubs / gallops. No extremity edema.  Abdomen: no tenderness, no masses palpated. . Bowel sounds positive.  Musculoskeletal: no clubbing / cyanosis. No joint deformity upper and lower extremities. Good ROM, no contractures. Normal muscle tone.  Skin: no rashes, lesions, ulcers. No induration Neurologic: CN 2-12 grossly intact. Sensation intact. Intact sensation of inner thigh. Strength 5/5 in all 4.  Psychiatric: Normal judgment and insight. Alert and oriented x 3. Normal mood.     Labs on Admission: I have personally reviewed following labs and imaging studies  CBC: Recent Labs  Lab 06/07/21 1840  WBC 6.9  NEUTROABS 4.2  HGB 10.9*  HCT 35.8*  MCV 88.2  PLT 656   Basic Metabolic Panel: Recent Labs  Lab 06/07/21 1840  NA 138  K 4.2  CL 101  CO2 27  GLUCOSE 141*  BUN 29*  CREATININE 1.46*  CALCIUM 9.5   GFR: CrCl cannot be calculated (Unknown ideal weight.). Liver  Function Tests: Recent Labs  Lab 06/07/21 1840  AST 30  ALT 21  ALKPHOS 67  BILITOT 0.5  PROT 7.4  ALBUMIN 4.1   No results for input(s): LIPASE, AMYLASE in the last 168 hours. No results for input(s): AMMONIA in the last 168 hours. Coagulation Profile: No results for input(s): INR, PROTIME in the last 168 hours. Cardiac Enzymes: No results for input(s): CKTOTAL, CKMB, CKMBINDEX, TROPONINI in the last 168 hours. BNP (last 3 results) No results for input(s): PROBNP in the last 8760 hours. HbA1C: No results for input(s): HGBA1C in the last 72 hours. CBG: Recent Labs  Lab 06/07/21 1612 06/07/21 2009  GLUCAP 181* 115*   Lipid Profile: No results for input(s): CHOL, HDL, LDLCALC, TRIG, CHOLHDL, LDLDIRECT in the last 72 hours. Thyroid Function Tests: No results for input(s): TSH, T4TOTAL, FREET4, T3FREE, THYROIDAB in the last 72 hours. Anemia Panel: No results for input(s): VITAMINB12, FOLATE, FERRITIN, TIBC, IRON, RETICCTPCT in the last 72 hours. Urine analysis:    Component Value Date/Time   COLORURINE YELLOW 06/07/2021 2202   APPEARANCEUR CLEAR 06/07/2021 2202   LABSPEC 1.010 06/07/2021 2202   PHURINE 7.0 06/07/2021 2202   GLUCOSEU NEGATIVE 06/07/2021 2202   HGBUR MODERATE (A) 06/07/2021 2202   BILIRUBINUR NEGATIVE 06/07/2021 2202   KETONESUR NEGATIVE 06/07/2021 2202   PROTEINUR 30 (A) 06/07/2021 2202   NITRITE NEGATIVE 06/07/2021 2202   LEUKOCYTESUR LARGE (A) 06/07/2021 2202    Radiological Exams on Admission: DG Chest 2 View  Result Date: 06/07/2021 CLINICAL DATA:  Weakness history of right lung cancer EXAM: CHEST - 2 VIEW COMPARISON:  10/22/2017, CT chest 11/24/2020 FINDINGS: Bandlike opacity in the right upper lobe consistent with post treatment change. No acute airspace disease or effusion. Normal cardiomediastinal silhouette with aortic atherosclerosis. No pneumothorax IMPRESSION: No active cardiopulmonary disease. Bandlike opacity in the right upper lobe  consistent with post treatment changes. Electronically Signed   By: Donavan Foil M.D.   On: 06/07/2021 19:16   CT Head Wo Contrast  Result Date: 06/07/2021 CLINICAL DATA:  Leg weakness.  Stroke suspected.  History of stroke. EXAM: CT HEAD WITHOUT CONTRAST TECHNIQUE: Contiguous axial images were obtained  from the base of the skull through the vertex without intravenous contrast. COMPARISON:  CT 10/22/2017.  MRI brain 10/22/2017 FINDINGS: Brain: No evidence of acute infarction, hemorrhage, hydrocephalus, extra-axial collection or mass lesion/mass effect. Mild diffuse cerebral atrophy. Mild ventricular dilatation consistent with central atrophy. Patchy low-attenuation changes in the deep white matter consistent small vessel ischemia. Vascular: Moderate intracranial arterial vascular calcifications. Skull: Calvarium appears intact. Sinuses/Orbits: Mucosal thickening in the paranasal sinuses. Possible air-fluid level in the left maxillary antrum. Mastoid air cells are clear. Other: None. IMPRESSION: 1. No acute intracranial abnormalities. Chronic atrophy and small vessel ischemic changes. 2. Inflammatory changes in the paranasal sinuses. Electronically Signed   By: Lucienne Capers M.D.   On: 06/07/2021 19:30      Assessment/Plan  Acute bilateral LE weakness  suspect secondary to UTI -Low suspicion for stroke with bilateral findings and negative CT head -no hx of trauma, back or hip issues or surgeries -UA was positive -Treat with IV Rocephin with urine culture pending -assess tomorrow with PT following IV antibiotics and fluids  AKI -Creatinine of 1.47 with a prior 1.39 - Monitor with continuous IV fluids  History of CVA - Continue aspirin  Chronic CHF - Last echocardiogram in 2018 with EF of 55 to 51% and no diastolic abnormalities - Continue to monitor fluid status with continuous IV fluids -Continue metoprolol  Hypertension - Hold Lasix and Lisinopril with AKI -continue  amlodipine  Type 2 diabetes -Add sliding scale insulin as needed  Hyperlipidemia - Continue statin  History of non-small cell lung cancer of the right lung - In remission.  Has surveillance follow-up with oncology annually   DVT prophylaxis:.Lovenox Code Status: Full Family Communication: Plan discussed with patient and family at bedside  disposition Plan: Home with observation Consults called:  Admission status: Observation  Level of care: Med-Surg  Status is: Observation  The patient remains OBS appropriate and will d/c before 2 midnights.  Dispo: The patient is from: Home              Anticipated d/c is to: Home              Patient currently is not medically stable to d/c.   Difficult to place patient No         Orene Desanctis DO [facility]   If 7PM-7AM, please contact night-coverage [email or web address]   06/07/2021, 11:53 PM

## 2021-06-07 NOTE — ED Triage Notes (Signed)
Pt presents with bilateral leg weakness and extreme fatigue since waking up this morning. According to caregiver pt blood sugar has been running really high today for unknown reason.

## 2021-06-07 NOTE — ED Provider Notes (Addendum)
[place]    CSN: [id number] Arrival date & time: 06/07/21  1549      History   Chief Complaint Chief Complaint  Patient presents with   Extremity Weakness    HPI Erin Carr is a 87 y.o. female presenting with new onset of bilateral leg weakness and saddle anesthesia since waking up this morning.  Medical history diabetes, COPD, osteoporosis, lung cancer s/p radiation and resection, diabetes. Niece is here today to help provide history. States that she was fine one day ago, but woke up today with significant weakness in her lower extremities.  She is typically able to ambulate, but she was not able to stand or get out of bed this morning.  Due to this, she wet herself.  She denies urinary incontinence, but states she does not typically wet herself.  She does also note constipation for the last 3 days.  Endorses decreased sensation of the back and inner thighs. Denies back pain, shortness of breath, chest pain, dizziness, weakness in upper extremities, recent falls. Denies urinary symptoms.  HPI  Past Medical History:  Diagnosis Date   Adenocarcinoma of right lung, stage 1 ([place]) 11/16/2016   Arthritis    Asthma    COPD (chronic obstructive pulmonary disease) ([place])    Diabetes mellitus without complication ([place])    Type II   Family history of adverse reaction to anesthesia    "aunt had PONV"   History of radiation therapy 12/19/16-12/27/16   SBRT to right upper lobe 54 Gy in 3 fractions   Hypercholesteremia    Hypertension    Osteoporosis    Positive PPD 12/16   PER PCP   Shortness of breath dyspnea    Tremors of nervous system    Left hand   Urinary frequency    Vitamin D deficiency    Wears glasses     Patient Active Problem List   Diagnosis Date Noted   Overweight with body mass index (BMI) 25.0-29.9 04/03/2018   History of cystocele 04/03/2018   Candidiasis of vagina 04/03/2018   Diabetes mellitus ([place]) 04/03/2018   Hyperlipidemia 04/03/2018    Hypertensive disorder 04/03/2018   Postmenopausal bleeding 04/03/2018   Urinary incontinence 04/03/2018   Acute CHF (congestive heart failure) ([place]) 10/22/2017   Foot drop 10/22/2017   Facial droop 10/22/2017   NSTEMI (non-ST elevated myocardial infarction) ([place]) 10/22/2017   Fall 10/22/2017   Leukocytosis 10/22/2017   Radiation pneumonitis ([place]) 10/22/2017   Foot drop, right    Shortness of breath 08/09/2017   Microcytic anemia 07/18/2017   CAP (community acquired pneumonia) 07/18/2017   Asthma, chronic, unspecified asthma severity, with acute exacerbation 07/18/2017   Type 2 diabetes mellitus without complication ([place]) 73/53/2992   Adenocarcinoma of right lung, stage 1 ([place]) 11/16/2016   Neoplasm of uncertain behavior of right upper lobe of lung 12/01/2015   Positive PPD    Pelvic prolapse 12/16/2014    Past Surgical History:  Procedure Laterality Date   ABDOMINAL HYSTERECTOMY     ANTERIOR AND POSTERIOR REPAIR N/A 12/16/2014   Procedure: ANTERIOR (CYSTOCELE) AND POSTERIOR REPAIR (RECTOCELE);  Surgeon: Crawford Givens, MD;  Location: [facility];  Service: Gynecology;  Laterality: N/A;   APPENDECTOMY     BOWEL RESECTION     BREAST BIOPSY Left    COLONOSCOPY     VIDEO BRONCHOSCOPY WITH ENDOBRONCHIAL ULTRASOUND N/A 12/17/2015   Procedure: VIDEO BRONCHOSCOPY WITH ENDOBRONCHIAL ULTRASOUND;  Surgeon: Ivin Poot, MD;  Location: [facility];  Service: Thoracic;  Laterality: N/A;    OB History   No obstetric history on file.      Home Medications    Prior to Admission medications   Medication Sig Start Date End Date Taking? Authorizing Provider  albuterol (PROVENTIL HFA;VENTOLIN HFA) 108 (90 Base) MCG/ACT inhaler Inhale 1 puff into the lungs every 4 (four) hours as needed for wheezing or shortness of breath.    [provider]  ALPHAGAN P 0.1 % SOLN  03/16/20   [provider]  amLODipine (NORVASC) 10 MG tablet  10/12/20   [provider]  aspirin EC 81 MG  tablet Take 81 mg by mouth daily.    [provider]  atorvastatin (LIPITOR) 40 MG tablet Take 1 tablet (40 mg total) by mouth daily at 6 PM. 10/25/17   Danford, Suann Larry, MD  ergocalciferol (VITAMIN D2) 50000 units capsule Take 50,000 Units by mouth once a week. On Monday    [provider]  ferrous sulfate 325 (65 FE) MG tablet Take 1 tablet (325 mg total) by mouth daily. Patient taking differently: Take 325 mg by mouth 3 (three) times a week.  07/18/17   Doristine Devoid, PA-C  furosemide (LASIX) 20 MG tablet Take 3 tablets (60 mg total) 2 (two) times daily by mouth. 10/09/17   Caccavale, Sophia, PA-C  gabapentin (NEURONTIN) 400 MG capsule Take 400 mg by mouth 2 (two) times daily.    [provider]  glipiZIDE (GLUCOTROL XL) 5 MG 24 hr tablet  11/16/20   [provider]  latanoprost (XALATAN) 0.005 % ophthalmic solution  10/16/20   [provider]  lisinopril (PRINIVIL,ZESTRIL) 20 MG tablet Take 20 mg by mouth 2 (two) times daily.    [provider]  LUMIGAN 0.01 % SOLN  03/16/20   [provider]  metFORMIN (GLUCOPHAGE-XR) 500 MG 24 hr tablet Take 500 mg by mouth once. 06/20/17   [provider]  metoprolol tartrate (LOPRESSOR) 25 MG tablet Take 0.5 tablets (12.5 mg total) by mouth 2 (two) times daily. Patient taking differently: Take 25 mg by mouth 2 (two) times daily.  10/25/17   Danford, Suann Larry, MD  potassium chloride (K-DUR) 10 MEQ tablet Take 10 mEq by mouth daily.    [provider]    Family History Family History  Problem Relation Age of Onset   Hypertension Father    Diabetes Father     Social History Social History   Tobacco Use   Smoking status: Former    Packs/day: 0.50    Years: 40.00    Pack years: 20.00    Types: Cigarettes    Quit date: 12/08/2001    Years since quitting: 19.5   Smokeless tobacco: Never   Tobacco comments:    quit about 15-16 years ago; 12/09/2015   Substance Use Topics   Alcohol use: No   Drug use: No     Allergies   Patient has no known allergies.   Review of Systems Review of Systems  Constitutional:  Negative for chills, fever and unexpected weight change.  Respiratory:  Negative for chest tightness and shortness of breath.   Cardiovascular:  Negative for chest pain and palpitations.  Gastrointestinal:  Negative for abdominal pain, diarrhea, nausea and vomiting.  Genitourinary:  Negative for decreased urine volume, difficulty urinating and frequency.  Musculoskeletal:  Negative for arthralgias, back pain, gait problem, joint swelling, myalgias, neck pain and neck stiffness.       Weakness in legs  Skin:  Negative for wound.  Neurological:  Negative for dizziness, tremors, seizures, syncope, facial asymmetry, speech difficulty, weakness, light-headedness, numbness and headaches.  All other systems reviewed and are negative.   Physical Exam Triage Vital Signs ED Triage Vitals  Enc Vitals Group     BP 06/07/21 1607 (!) 109/45     Pulse Rate 06/07/21 1607 60     Resp 06/07/21 1607 16     Temp 06/07/21 1607 97.9 F (36.6 C)     Temp Source 06/07/21 1607 Oral     SpO2 06/07/21 1607 100 %     Weight --      Height --      Head Circumference --      Peak Flow --      Pain Score 06/07/21 1601 0     Pain Loc --      Pain Edu? --      Excl. in [place]? --    No data found.  Updated Vital Signs BP (!) 109/45 (BP Location: Right Arm)   Pulse 60   Temp 97.9 F (36.6 C) (Oral)   Resp 16   SpO2 100%   Visual Acuity Right Eye Distance:   Left Eye Distance:   Bilateral Distance:    Right Eye Near:   Left Eye Near:    Bilateral Near:     Physical Exam Vitals reviewed.  Constitutional:      General: She is not in acute distress.    Appearance: Normal appearance. She is not ill-appearing.  HENT:     Head: Normocephalic and atraumatic.  Cardiovascular:     Rate and Rhythm: Normal rate and regular rhythm.      Heart sounds: Normal heart sounds.  Pulmonary:     Effort: Pulmonary effort is normal.     Breath sounds: Normal breath sounds and air entry.  Abdominal:     Tenderness: There is no abdominal tenderness. There is no right CVA tenderness, left CVA tenderness, guarding or rebound.  Musculoskeletal:     Cervical back: Normal range of motion. No swelling, deformity, signs of trauma, rigidity, spasms, tenderness, bony tenderness or crepitus. No pain with movement.     Thoracic back: No swelling, deformity, signs of trauma, spasms, tenderness or bony tenderness. Normal range of motion. No scoliosis.     Lumbar back: No swelling, deformity, signs of trauma, spasms, tenderness or bony tenderness. Normal range of motion. Negative right straight leg raise test and negative left straight leg raise test. No scoliosis.     Comments: Exam limited due to patient mobility. Pt in wheelchair. Sterngth 1/5 in bilateral LEs. Decreased sensation inner thighs and back of thighs. No pedal edema. Strength and sensation intact UEs.   No midline spinous tenderness, deformity, stepoff.  Absolutely no other injury, deformity, tenderness, ecchymosis, abrasion.  Neurological:     General: No focal deficit present.     Mental Status: She is alert.     Cranial Nerves: Cranial nerves are intact. No cranial nerve deficit.     Sensory: Sensory deficit present.     Motor: Motor function is intact. No weakness or pronator drift.     Coordination: Coordination is intact. Romberg sign negative.     Gait: Gait abnormal.     Comments: (R eye is prosthetic). PERRLA, EOMI. Negative pronator drift. CN 2-12 grossly intact.   Psychiatric:        Mood and Affect: Mood normal.        Behavior: Behavior normal.  Thought Content: Thought content normal.        Judgment: Judgment normal.     UC Treatments / Results  Labs (all labs ordered are listed, but only abnormal results are displayed) Labs Reviewed  CBG MONITORING, ED  - Abnormal; Notable for the following components:      Result Value   Glucose-Capillary 181 (*)    All other components within normal limits    EKG   Radiology No results found.  Procedures Procedures (including critical care time)  Medications Ordered in UC Medications - No data to display  Initial Impression / Assessment and Plan / UC Course  I have reviewed the triage vital signs and the nursing notes.  Pertinent labs & imaging results that were available during my care of the patient were reviewed by me and considered in my medical decision making (see chart for details).     This patient is a very pleasant 87 y.o. year old female presenting with new onset of significant LE weakness, decreased sensation inner thighs, episode of urinary incontinence. EKG unchanged from 2019 EKG. Nonfasting CBG 181, pt with diabetes. Pt is hypotensive at 109/45. Sent this patient to ED via personal vehicle for further evaluation and management. Suspect she will require labwork and imagine to rule out cauda equina or similar. Coding this visit a Level 5 as this patient was admitted to hospital.  Final Clinical Impressions(s) / UC Diagnoses   Final diagnoses:  Weakness of both lower extremities  Hypotension, unspecified hypotension type     Discharge Instructions      Sent to ED via personal vehicle   ED Prescriptions   None    PDMP not reviewed this encounter.   Hazel Sams, PA-C 06/07/21 1722    Hazel Sams, PA-C 06/08/21 [phone]

## 2021-06-07 NOTE — ED Triage Notes (Signed)
Pt reports waking up this morning and having trouble standing up. She endorses generalized weakness and lethargy. Pt was sent here by UC for further evaluation.

## 2021-06-07 NOTE — Discharge Instructions (Signed)
Sent to ED via personal vehicle

## 2021-06-08 DIAGNOSIS — I5022 Chronic systolic (congestive) heart failure: Secondary | ICD-10-CM

## 2021-06-08 DIAGNOSIS — Z85118 Personal history of other malignant neoplasm of bronchus and lung: Secondary | ICD-10-CM | POA: Diagnosis not present

## 2021-06-08 DIAGNOSIS — R29898 Other symptoms and signs involving the musculoskeletal system: Secondary | ICD-10-CM | POA: Diagnosis not present

## 2021-06-08 DIAGNOSIS — I10 Essential (primary) hypertension: Secondary | ICD-10-CM | POA: Diagnosis not present

## 2021-06-08 DIAGNOSIS — N179 Acute kidney failure, unspecified: Secondary | ICD-10-CM | POA: Diagnosis not present

## 2021-06-08 DIAGNOSIS — N3000 Acute cystitis without hematuria: Secondary | ICD-10-CM | POA: Diagnosis not present

## 2021-06-08 DIAGNOSIS — E119 Type 2 diabetes mellitus without complications: Secondary | ICD-10-CM | POA: Diagnosis not present

## 2021-06-08 LAB — BASIC METABOLIC PANEL
Anion gap: 11 (ref 5–15)
BUN: 27 mg/dL — ABNORMAL HIGH (ref 8–23)
CO2: 27 mmol/L (ref 22–32)
Calcium: 9.1 mg/dL (ref 8.9–10.3)
Chloride: 100 mmol/L (ref 98–111)
Creatinine, Ser: 1.44 mg/dL — ABNORMAL HIGH (ref 0.44–1.00)
GFR, Estimated: 35 mL/min — ABNORMAL LOW (ref >60)
Glucose, Bld: 290 mg/dL — ABNORMAL HIGH (ref 70–99)
Potassium: 3.8 mmol/L (ref 3.5–5.1)
Sodium: 138 mmol/L (ref 135–145)

## 2021-06-08 LAB — IRON AND TIBC
Iron: 31 ug/dL (ref 28–170)
Saturation Ratios: 9 % — ABNORMAL LOW (ref 10.4–31.8)
TIBC: 356 ug/dL (ref 250–450)
UIBC: 325 ug/dL

## 2021-06-08 LAB — CBC
HCT: 33.6 % — ABNORMAL LOW (ref 36.0–46.0)
Hemoglobin: 10.3 g/dL — ABNORMAL LOW (ref 12.0–15.0)
MCH: 26.8 pg (ref 26.0–34.0)
MCHC: 30.7 g/dL (ref 30.0–36.0)
MCV: 87.5 fL (ref 80.0–100.0)
Platelets: 158 10*3/uL (ref 150–400)
RBC: 3.84 MIL/uL — ABNORMAL LOW (ref 3.87–5.11)
RDW: 14.2 % (ref 11.5–15.5)
WBC: 5 10*3/uL (ref 4.0–10.5)
nRBC: 0 % (ref 0.0–0.2)

## 2021-06-08 LAB — RESP PANEL BY RT-PCR (FLU A&B, COVID) ARPGX2
Influenza A by PCR: NEGATIVE
Influenza B by PCR: NEGATIVE
SARS Coronavirus 2 by RT PCR: NEGATIVE

## 2021-06-08 LAB — FOLATE: Folate: 22.2 ng/mL (ref >5.9)

## 2021-06-08 LAB — VITAMIN B12: Vitamin B-12: 194 pg/mL (ref 180–914)

## 2021-06-08 MED ORDER — LACTATED RINGERS IV SOLN: INTRAVENOUS | Status: DC

## 2021-06-08 MED ORDER — LATANOPROST 0.005 % OP SOLN
1.0000 [drp] | Freq: Every day | OPHTHALMIC | Status: DC
  Administered 2021-06-08 – 2021-06-19 (×12): 1 [drp] via OPHTHALMIC
  Filled 2021-06-08: qty 2.5

## 2021-06-08 MED ORDER — BRIMONIDINE TARTRATE 0.2 % OP SOLN
1.0000 [drp] | Freq: Two times a day (BID) | OPHTHALMIC | Status: DC
  Administered 2021-06-08 – 2021-06-20 (×23): 1 [drp] via OPHTHALMIC
  Filled 2021-06-08: qty 5

## 2021-06-08 MED ORDER — GABAPENTIN 400 MG PO CAPS
400.0000 mg | ORAL_CAPSULE | Freq: Two times a day (BID) | ORAL | Status: DC
  Administered 2021-06-08 – 2021-06-20 (×26): 400 mg via ORAL
  Filled 2021-06-08 (×26): qty 1

## 2021-06-08 MED ORDER — ATORVASTATIN CALCIUM 40 MG PO TABS
40.0000 mg | ORAL_TABLET | Freq: Every day | ORAL | Status: DC
  Administered 2021-06-08 – 2021-06-19 (×12): 40 mg via ORAL
  Filled 2021-06-08 (×12): qty 1

## 2021-06-08 MED ORDER — AMLODIPINE BESYLATE 10 MG PO TABS
10.0000 mg | ORAL_TABLET | Freq: Every day | ORAL | Status: DC
  Administered 2021-06-08 – 2021-06-20 (×13): 10 mg via ORAL
  Filled 2021-06-08 (×3): qty 1
  Filled 2021-06-08: qty 2
  Filled 2021-06-08 (×10): qty 1

## 2021-06-08 MED ORDER — ASPIRIN EC 81 MG PO TBEC
81.0000 mg | DELAYED_RELEASE_TABLET | Freq: Every day | ORAL | Status: DC
  Administered 2021-06-08 – 2021-06-20 (×13): 81 mg via ORAL
  Filled 2021-06-08 (×13): qty 1

## 2021-06-08 MED ORDER — VITAMIN D (ERGOCALCIFEROL) 1.25 MG (50000 UNIT) PO CAPS
50000.0000 [IU] | ORAL_CAPSULE | ORAL | Status: DC
  Administered 2021-06-13 – 2021-06-20 (×2): 50000 [IU] via ORAL
  Filled 2021-06-08 (×2): qty 1

## 2021-06-08 MED ORDER — POTASSIUM CHLORIDE CRYS ER 10 MEQ PO TBCR
10.0000 meq | EXTENDED_RELEASE_TABLET | Freq: Every day | ORAL | Status: DC
  Administered 2021-06-08 – 2021-06-20 (×13): 10 meq via ORAL
  Filled 2021-06-08 (×18): qty 1

## 2021-06-08 MED ORDER — METOPROLOL TARTRATE 25 MG PO TABS
25.0000 mg | ORAL_TABLET | Freq: Two times a day (BID) | ORAL | Status: DC
  Administered 2021-06-08 – 2021-06-12 (×10): 25 mg via ORAL
  Filled 2021-06-08 (×10): qty 1

## 2021-06-08 NOTE — Progress Notes (Signed)
PROGRESS NOTE  Erin Carr  YDX:[id number] DOB: [birth year] DOA: 06/07/2021 PCP: Vincente Liberty, MD  Brief Narrative: Erin Carr is an 87 y.o. female with a history of CVA, right foot drop, NSTEMI, CHF, NSCLC in remission s/p radiotherapy, HTN, T2DM, asthma, and HLD who presented to the ED 7/5 with bilateral leg weakness, referred from urgent care. Also had new urinary incontinence. CT head showed no acute stroke, creatinine elevated at 1.46, and urinalysis with significant pyuria. Ceftriaxone started with IV fluids and pending urine culture. PT consult requested, recommending 24 hour supervision which has not been secured.   Assessment & Plan: Principal Problem:   UTI (urinary tract infection) Active Problems:   Type 2 diabetes mellitus without complication (HCC)   Hypertensive disorder   Weakness of both legs   Chronic systolic CHF (congestive heart failure) (HCC)   History of lung cancer   AKI (acute kidney injury) ([place])  Lower extremity weakness: Improving, suspect infection-related worsening of general functional decline. No back pain or trauma or ongoing incontinence. Neuro exam with attention to legs is reassuring, and overall improved with fluids and abx.  - Treat UTI - Continue monitoring exam. No further neuroimaging currently planned  UTI:  - Continue ceftriaxone pending culture data.   AKI: Possibly actually has just progressed to CKD IIIb.  - continue trending in AM with additional IVF.  - Avoid nephrotoxins.   Chronic HFpEF, HTN: Appears euvolemic,  - Continue metoprolol, norvasc. Hold ACEi and lasix No diuretic currently needed  History of CVA:  - Statin, ASA  T2DM:  - SSI  HLD:  - Statin  Hx NSCLC: Routine onc f/u.   DVT prophylaxis: Lovenox Code Status: Full Family Communication: None at bedside Disposition Plan:  Status is: Observation  The patient will require care spanning > 2 midnights and should be moved to inpatient because: Unsafe  d/c plan  Dispo: The patient is from: Home              Anticipated d/c is to: Home              Patient currently is not medically stable to d/c.   Difficult to place patient No  Consultants:  None  Procedures:  None  Antimicrobials: Ceftriaxone   Subjective: Feels diffusely weak, but says she walked around today. When probed, she means she stood up with PT assistance and stepped twice, returning to bed. Lives alone since her sister went to a nursing home.  Objective: Vitals:   06/08/21 1230 06/08/21 1300 06/08/21 1330 06/08/21 1500  BP: (!) 150/56 138/76 (!) 142/57 (!) 134/51  Pulse: 64  64 60  Resp: 13 16 15 14   Temp:      TempSrc:      SpO2: 92% 98% 94% 95%    Intake/Output Summary (Last 24 hours) at 06/08/2021 1544 Last data filed at 06/08/2021 1307 Gross per 24 hour  Intake 100 ml  Output 1520 ml  Net -1420 ml   Gen: Frail elderly female in no distress Pulm: Non-labored breathing. Clear to auscultation bilaterally.  CV: Regular rate and rhythm. No murmur, rub, or gallop. No JVD, no pitting pedal edema. GI: Abdomen soft, non-tender, non-distended, with normoactive bowel sounds. No organomegaly or masses felt. Ext: Warm, no deformities Skin: No rashes, lesions or ulcers on visualized skin Neuro: Alert and oriented. R foot drop incomplete. No focal neurological deficits. Psych: Judgement and insight appear normal. Mood & affect appropriate.   Data Reviewed: I have personally reviewed  following labs and imaging studies  CBC: Recent Labs  Lab 06/07/21 1840 06/08/21 0413  WBC 6.9 5.0  NEUTROABS 4.2  --   HGB 10.9* 10.3*  HCT 35.8* 33.6*  MCV 88.2 87.5  PLT 168 295   Basic Metabolic Panel: Recent Labs  Lab 06/07/21 1840 06/08/21 0413  NA 138 138  K 4.2 3.8  CL 101 100  CO2 27 27  GLUCOSE 141* 290*  BUN 29* 27*  CREATININE 1.46* 1.44*  CALCIUM 9.5 9.1   GFR: CrCl cannot be calculated (Unknown ideal weight.). Liver Function Tests: Recent Labs   Lab 06/07/21 1840  AST 30  ALT 21  ALKPHOS 67  BILITOT 0.5  PROT 7.4  ALBUMIN 4.1   No results for input(s): LIPASE, AMYLASE in the last 168 hours. No results for input(s): AMMONIA in the last 168 hours. Coagulation Profile: No results for input(s): INR, PROTIME in the last 168 hours. Cardiac Enzymes: No results for input(s): CKTOTAL, CKMB, CKMBINDEX, TROPONINI in the last 168 hours. BNP (last 3 results) No results for input(s): PROBNP in the last 8760 hours. HbA1C: No results for input(s): HGBA1C in the last 72 hours. CBG: Recent Labs  Lab 06/07/21 1612 06/07/21 2009  GLUCAP 181* 115*   Lipid Profile: No results for input(s): CHOL, HDL, LDLCALC, TRIG, CHOLHDL, LDLDIRECT in the last 72 hours. Thyroid Function Tests: No results for input(s): TSH, T4TOTAL, FREET4, T3FREE, THYROIDAB in the last 72 hours. Anemia Panel: Recent Labs    06/08/21 0019  VITAMINB12 194  FOLATE 22.2  TIBC 356  IRON 31   Urine analysis:    Component Value Date/Time   COLORURINE YELLOW 06/07/2021 2202   APPEARANCEUR CLEAR 06/07/2021 2202   LABSPEC 1.010 06/07/2021 2202   PHURINE 7.0 06/07/2021 2202   GLUCOSEU NEGATIVE 06/07/2021 2202   HGBUR MODERATE (A) 06/07/2021 2202   BILIRUBINUR NEGATIVE 06/07/2021 2202   KETONESUR NEGATIVE 06/07/2021 2202   PROTEINUR 30 (A) 06/07/2021 2202   NITRITE NEGATIVE 06/07/2021 2202   LEUKOCYTESUR LARGE (A) 06/07/2021 2202   Recent Results (from the past 240 hour(s))  Resp Panel by RT-PCR (Flu A&B, Covid) Nasopharyngeal Swab     Status: None   Collection Time: 06/07/21 11:19 PM   Specimen: Nasopharyngeal Swab; Nasopharyngeal(NP) swabs in vial transport medium  Result Value Ref Range Status   SARS Coronavirus 2 by RT PCR NEGATIVE NEGATIVE Final    Comment: (NOTE) SARS-CoV-2 target nucleic acids are NOT DETECTED.  The SARS-CoV-2 RNA is generally detectable in upper respiratory specimens during the acute phase of infection. The lowest concentration of  SARS-CoV-2 viral copies this assay can detect is 138 copies/mL. A negative result does not preclude SARS-Cov-2 infection and should not be used as the sole basis for treatment or other patient management decisions. A negative result may occur with  improper specimen collection/handling, submission of specimen other than nasopharyngeal swab, presence of viral mutation(s) within the areas targeted by this assay, and inadequate number of viral copies(<138 copies/mL). A negative result must be combined with clinical observations, patient history, and epidemiological information. The expected result is Negative.  Fact Sheet for Patients:  EntrepreneurPulse.com.au  Fact Sheet for Healthcare Providers:  IncredibleEmployment.be  This test is no t yet approved or cleared by the [place] FDA and  has been authorized for detection and/or diagnosis of SARS-CoV-2 by FDA under an Emergency Use Authorization (EUA). This EUA will remain  in effect (meaning this test can be used) for the duration of the COVID-19 declaration  under Section 564(b)(1) of the Act, 21 U.S.C.section 360bbb-3(b)(1), unless the authorization is terminated  or revoked sooner.       Influenza A by PCR NEGATIVE NEGATIVE Final   Influenza B by PCR NEGATIVE NEGATIVE Final    Comment: (NOTE) The Xpert Xpress SARS-CoV-2/FLU/RSV plus assay is intended as an aid in the diagnosis of influenza from Nasopharyngeal swab specimens and should not be used as a sole basis for treatment. Nasal washings and aspirates are unacceptable for Xpert Xpress SARS-CoV-2/FLU/RSV testing.  Fact Sheet for Patients: EntrepreneurPulse.com.au  Fact Sheet for Healthcare Providers: IncredibleEmployment.be  This test is not yet approved or cleared by the [place] FDA and has been authorized for detection and/or diagnosis of SARS-CoV-2 by FDA under an Emergency Use  Authorization (EUA). This EUA will remain in effect (meaning this test can be used) for the duration of the COVID-19 declaration under Section 564(b)(1) of the Act, 21 U.S.C. section 360bbb-3(b)(1), unless the authorization is terminated or revoked.  Performed at [facility], [place] [address], [place], [place] [address]       Radiology Studies: DG Chest 2 View  Result Date: 06/07/2021 CLINICAL DATA:  Weakness history of right lung cancer EXAM: CHEST - 2 VIEW COMPARISON:  10/22/2017, CT chest 11/24/2020 FINDINGS: Bandlike opacity in the right upper lobe consistent with post treatment change. No acute airspace disease or effusion. Normal cardiomediastinal silhouette with aortic atherosclerosis. No pneumothorax IMPRESSION: No active cardiopulmonary disease. Bandlike opacity in the right upper lobe consistent with post treatment changes. Electronically Signed   By: Donavan Foil M.D.   On: 06/07/2021 19:16   CT Head Wo Contrast  Result Date: 06/07/2021 CLINICAL DATA:  Leg weakness.  Stroke suspected.  History of stroke. EXAM: CT HEAD WITHOUT CONTRAST TECHNIQUE: Contiguous axial images were obtained from the base of the skull through the vertex without intravenous contrast. COMPARISON:  CT 10/22/2017.  MRI brain 10/22/2017 FINDINGS: Brain: No evidence of acute infarction, hemorrhage, hydrocephalus, extra-axial collection or mass lesion/mass effect. Mild diffuse cerebral atrophy. Mild ventricular dilatation consistent with central atrophy. Patchy low-attenuation changes in the deep white matter consistent small vessel ischemia. Vascular: Moderate intracranial arterial vascular calcifications. Skull: Calvarium appears intact. Sinuses/Orbits: Mucosal thickening in the paranasal sinuses. Possible air-fluid level in the left maxillary antrum. Mastoid air cells are clear. Other: None. IMPRESSION: 1. No acute intracranial abnormalities. Chronic atrophy and small vessel ischemic changes. 2.  Inflammatory changes in the paranasal sinuses. Electronically Signed   By: Lucienne Capers M.D.   On: 06/07/2021 19:30    Scheduled Meds:  amLODipine  10 mg Oral Daily   aspirin EC  81 mg Oral Daily   atorvastatin  40 mg Oral q1800   brimonidine  1 drop Left Eye BID   enoxaparin (LOVENOX) injection  40 mg Subcutaneous Q24H   gabapentin  400 mg Oral BID   latanoprost  1 drop Left Eye QHS   metoprolol tartrate  25 mg Oral BID   potassium chloride  10 mEq Oral Daily   [START ON 06/13/2021] Vitamin D (Ergocalciferol)  50,000 Units Oral Weekly   Continuous Infusions:  [START ON 06/09/2021] cefTRIAXone (ROCEPHIN)  IV       LOS: 0 days   Time spent: 25 minutes.  Patrecia Pour, MD [facility] [email or web address] 06/08/2021, 3:44 PM

## 2021-06-08 NOTE — ED Notes (Signed)
Transport called to take patient up on the floor.

## 2021-06-08 NOTE — Evaluation (Signed)
Physical Therapy Evaluation Patient Details Name: Erin Carr MRN: [id number] DOB: October 03, 1930 Today's Date: 06/08/2021   History of Present Illness  Erin Carr is a 87 y.o. female with medical history significant for CVA with right LE foot drop, CHF, NSTEMI, NSCLC of the right lung s/p radiotherapy in remission, hypertension, asthma, type 2 diabetes, anemia and hyperlipidemia who presents with new onset lower extremity weakness suspect secondary to UTI  Clinical Impression  The  patient is very sweet, oriented. At baseline, lives alone with niece assisting. Patient ambulates with 4 Wheeled Rw.  Patient  did mobilize and ambulate with min assist and RW  for a short distance(limited by lines and purewick).  The patient is noted with tremors.  Currently, if [place] home, patient will require 24/7 assistance. Niece not present.  Pt admitted with above diagnosis.  Pt currently with functional limitations due to the deficits listed below (see PT Problem List). Pt will benefit from skilled PT to increase their independence and safety with mobility to allow discharge to the venue listed below.       Follow Up Recommendations Home health PT;Supervision/Assistance - 24 hour if niece available.    Equipment Recommendations  None recommended by PT    Recommendations for Other Services   OT    Precautions / Restrictions Precautions Precautions: Fall Precaution Comments: wears depends Required Braces or Orthoses: Other Brace Other Brace: right AFO      Mobility  Bed Mobility Overal bed mobility: Needs Assistance Bed Mobility: Supine to Sit;Sit to Supine     Supine to sit: Min assist Sit to supine: Min assist   General bed mobility comments: gentle min assist with trunk to sit up, able to place legs back onto bed to return to supine    Transfers Overall transfer level: Needs assistance Equipment used: Rolling walker (2 wheeled) Transfers: Sit to/from Stand Sit to Stand: Min  assist         General transfer comment: steady assist to stand, Holding onto Rw.  Ambulation/Gait Ambulation/Gait assistance: Min assist Gait Distance (Feet): 6 Feet Assistive device: Rolling walker (2 wheeled) Gait Pattern/deviations: Step-to pattern;Step-through pattern Gait velocity: decr   General Gait Details: steady assist, able to step forward and backwards, limited by lines and tubes, purwick in place with depends and jeans  Stairs            Wheelchair Mobility    Modified Rankin (Stroke Patients Only)       Balance Overall balance assessment: History of Falls;Needs assistance Sitting-balance support: Feet supported;Bilateral upper extremity supported Sitting balance-Leahy Scale: Fair     Standing balance support: During functional activity;Bilateral upper extremity supported Standing balance-Leahy Scale: Poor Standing balance comment: reliant on UE's                             Pertinent Vitals/Pain      Home Living Family/patient expects to be discharged to:: Private residence Living Arrangements: Alone Available Help at Discharge: Family;Available 24 hours/day Type of Home: House Home Access: Level entry     Home Layout: One level Home Equipment: Walker - 4 wheels      Prior Function Level of Independence: Needs assistance   Gait / Transfers Assistance Needed: ambulates with Rollator, sponge bathes, neuce assists with transportation and groceries,  meals           Hand Dominance   Dominant Hand: Right    Extremity/Trunk Assessment  Upper Extremity Assessment Upper Extremity Assessment: Overall WFL for tasks assessed (noted resting tremors)    Lower Extremity Assessment Lower Extremity Assessment: Generalized weakness RLE Deficits / Details: has AFO, noted dorsiflexion is  weak but intact,    Cervical / Trunk Assessment Cervical / Trunk Assessment: Kyphotic;Other exceptions  Communication   Communication: No  difficulties  Cognition Arousal/Alertness: Awake/alert Behavior During Therapy: WFL for tasks assessed/performed Overall Cognitive Status: Within Functional Limits for tasks assessed                                        General Comments      Exercises     Assessment/Plan    PT Assessment Patient needs continued PT services  PT Problem List Decreased strength;Decreased mobility;Decreased safety awareness;Decreased activity tolerance;Decreased balance;Decreased knowledge of use of DME       PT Treatment Interventions DME instruction;Therapeutic activities;Gait training;Therapeutic exercise;Patient/family education;Functional mobility training    PT Goals (Current goals can be found in the Care Plan section)  Acute Rehab PT Goals Patient Stated Goal: to walk and go home PT Goal Formulation: With patient Time For Goal Achievement: 06/22/21 Potential to Achieve Goals: Good    Frequency Min 3X/week   Barriers to discharge        Co-evaluation               AM-PAC PT "6 Clicks" Mobility  Outcome Measure Help needed turning from your back to your side while in a flat bed without using bedrails?: A Little Help needed moving from lying on your back to sitting on the side of a flat bed without using bedrails?: A Little Help needed moving to and from a bed to a chair (including a wheelchair)?: A Little Help needed standing up from a chair using your arms (e.g., wheelchair or bedside chair)?: A Little Help needed to walk in hospital room?: A Little Help needed climbing 3-5 steps with a railing? : A Lot 6 Click Score: 17    End of Session Equipment Utilized During Treatment: Gait belt Activity Tolerance: Patient tolerated treatment well Patient left: in bed;with call bell/phone within reach;with bed alarm set Nurse Communication: Mobility status PT Visit Diagnosis: Unsteadiness on feet (R26.81);Difficulty in walking, not elsewhere classified (R26.2)     Time: 7915-0569 PT Time Calculation (min) (ACUTE ONLY): 26 min   Charges:   PT Evaluation $PT Eval Low Complexity: 1 Low PT Treatments $Gait Training: 8-22 mins        Tresa Endo PT Acute Rehabilitation Services  Office [phone]   Claretha Cooper 06/08/2021, 9:41 AM

## 2021-06-09 DIAGNOSIS — Z970 Presence of artificial eye: Secondary | ICD-10-CM | POA: Diagnosis not present

## 2021-06-09 DIAGNOSIS — N179 Acute kidney failure, unspecified: Secondary | ICD-10-CM | POA: Diagnosis not present

## 2021-06-09 DIAGNOSIS — Z7189 Other specified counseling: Secondary | ICD-10-CM | POA: Diagnosis not present

## 2021-06-09 DIAGNOSIS — I10 Essential (primary) hypertension: Secondary | ICD-10-CM | POA: Diagnosis not present

## 2021-06-09 DIAGNOSIS — E46 Unspecified protein-calorie malnutrition: Secondary | ICD-10-CM | POA: Diagnosis not present

## 2021-06-09 DIAGNOSIS — Z79899 Other long term (current) drug therapy: Secondary | ICD-10-CM | POA: Diagnosis not present

## 2021-06-09 DIAGNOSIS — E1142 Type 2 diabetes mellitus with diabetic polyneuropathy: Secondary | ICD-10-CM | POA: Diagnosis present

## 2021-06-09 DIAGNOSIS — R2689 Other abnormalities of gait and mobility: Secondary | ICD-10-CM | POA: Diagnosis not present

## 2021-06-09 DIAGNOSIS — H409 Unspecified glaucoma: Secondary | ICD-10-CM | POA: Diagnosis present

## 2021-06-09 DIAGNOSIS — Z85118 Personal history of other malignant neoplasm of bronchus and lung: Secondary | ICD-10-CM | POA: Diagnosis not present

## 2021-06-09 DIAGNOSIS — N3000 Acute cystitis without hematuria: Secondary | ICD-10-CM | POA: Diagnosis not present

## 2021-06-09 DIAGNOSIS — I5032 Chronic diastolic (congestive) heart failure: Secondary | ICD-10-CM | POA: Diagnosis not present

## 2021-06-09 DIAGNOSIS — R5381 Other malaise: Secondary | ICD-10-CM | POA: Diagnosis not present

## 2021-06-09 DIAGNOSIS — D696 Thrombocytopenia, unspecified: Secondary | ICD-10-CM | POA: Diagnosis present

## 2021-06-09 DIAGNOSIS — M48061 Spinal stenosis, lumbar region without neurogenic claudication: Secondary | ICD-10-CM | POA: Diagnosis not present

## 2021-06-09 DIAGNOSIS — Z209 Contact with and (suspected) exposure to unspecified communicable disease: Secondary | ICD-10-CM | POA: Diagnosis not present

## 2021-06-09 DIAGNOSIS — I69398 Other sequelae of cerebral infarction: Secondary | ICD-10-CM | POA: Diagnosis not present

## 2021-06-09 DIAGNOSIS — J45909 Unspecified asthma, uncomplicated: Secondary | ICD-10-CM | POA: Diagnosis not present

## 2021-06-09 DIAGNOSIS — I5022 Chronic systolic (congestive) heart failure: Secondary | ICD-10-CM | POA: Diagnosis not present

## 2021-06-09 DIAGNOSIS — M21371 Foot drop, right foot: Secondary | ICD-10-CM | POA: Diagnosis present

## 2021-06-09 DIAGNOSIS — E119 Type 2 diabetes mellitus without complications: Secondary | ICD-10-CM | POA: Diagnosis not present

## 2021-06-09 DIAGNOSIS — E1165 Type 2 diabetes mellitus with hyperglycemia: Secondary | ICD-10-CM | POA: Diagnosis present

## 2021-06-09 DIAGNOSIS — N390 Urinary tract infection, site not specified: Secondary | ICD-10-CM | POA: Diagnosis present

## 2021-06-09 DIAGNOSIS — E1140 Type 2 diabetes mellitus with diabetic neuropathy, unspecified: Secondary | ICD-10-CM | POA: Diagnosis not present

## 2021-06-09 DIAGNOSIS — M810 Age-related osteoporosis without current pathological fracture: Secondary | ICD-10-CM | POA: Diagnosis present

## 2021-06-09 DIAGNOSIS — Z8616 Personal history of COVID-19: Secondary | ICD-10-CM | POA: Diagnosis not present

## 2021-06-09 DIAGNOSIS — R531 Weakness: Secondary | ICD-10-CM | POA: Diagnosis present

## 2021-06-09 DIAGNOSIS — E1122 Type 2 diabetes mellitus with diabetic chronic kidney disease: Secondary | ICD-10-CM | POA: Diagnosis present

## 2021-06-09 DIAGNOSIS — U071 COVID-19: Secondary | ICD-10-CM | POA: Diagnosis not present

## 2021-06-09 DIAGNOSIS — Z8673 Personal history of transient ischemic attack (TIA), and cerebral infarction without residual deficits: Secondary | ICD-10-CM | POA: Diagnosis not present

## 2021-06-09 DIAGNOSIS — Z515 Encounter for palliative care: Secondary | ICD-10-CM | POA: Diagnosis not present

## 2021-06-09 DIAGNOSIS — J449 Chronic obstructive pulmonary disease, unspecified: Secondary | ICD-10-CM | POA: Diagnosis present

## 2021-06-09 DIAGNOSIS — L89626 Pressure-induced deep tissue damage of left heel: Secondary | ICD-10-CM | POA: Diagnosis not present

## 2021-06-09 DIAGNOSIS — R5383 Other fatigue: Secondary | ICD-10-CM | POA: Diagnosis not present

## 2021-06-09 DIAGNOSIS — R32 Unspecified urinary incontinence: Secondary | ICD-10-CM | POA: Diagnosis present

## 2021-06-09 DIAGNOSIS — R29898 Other symptoms and signs involving the musculoskeletal system: Secondary | ICD-10-CM | POA: Diagnosis not present

## 2021-06-09 DIAGNOSIS — I959 Hypotension, unspecified: Secondary | ICD-10-CM | POA: Diagnosis not present

## 2021-06-09 DIAGNOSIS — M6281 Muscle weakness (generalized): Secondary | ICD-10-CM | POA: Diagnosis not present

## 2021-06-09 DIAGNOSIS — D72819 Decreased white blood cell count, unspecified: Secondary | ICD-10-CM | POA: Diagnosis present

## 2021-06-09 DIAGNOSIS — N1832 Chronic kidney disease, stage 3b: Secondary | ICD-10-CM | POA: Diagnosis present

## 2021-06-09 DIAGNOSIS — D509 Iron deficiency anemia, unspecified: Secondary | ICD-10-CM | POA: Diagnosis not present

## 2021-06-09 DIAGNOSIS — E785 Hyperlipidemia, unspecified: Secondary | ICD-10-CM | POA: Diagnosis not present

## 2021-06-09 DIAGNOSIS — E559 Vitamin D deficiency, unspecified: Secondary | ICD-10-CM | POA: Diagnosis present

## 2021-06-09 DIAGNOSIS — E7800 Pure hypercholesterolemia, unspecified: Secondary | ICD-10-CM | POA: Diagnosis present

## 2021-06-09 DIAGNOSIS — L89152 Pressure ulcer of sacral region, stage 2: Secondary | ICD-10-CM | POA: Diagnosis not present

## 2021-06-09 DIAGNOSIS — Z923 Personal history of irradiation: Secondary | ICD-10-CM | POA: Diagnosis not present

## 2021-06-09 DIAGNOSIS — Z7401 Bed confinement status: Secondary | ICD-10-CM | POA: Diagnosis not present

## 2021-06-09 DIAGNOSIS — I5042 Chronic combined systolic (congestive) and diastolic (congestive) heart failure: Secondary | ICD-10-CM | POA: Diagnosis present

## 2021-06-09 DIAGNOSIS — I130 Hypertensive heart and chronic kidney disease with heart failure and stage 1 through stage 4 chronic kidney disease, or unspecified chronic kidney disease: Secondary | ICD-10-CM | POA: Diagnosis present

## 2021-06-09 DIAGNOSIS — I5040 Unspecified combined systolic (congestive) and diastolic (congestive) heart failure: Secondary | ICD-10-CM | POA: Diagnosis not present

## 2021-06-09 DIAGNOSIS — I252 Old myocardial infarction: Secondary | ICD-10-CM | POA: Diagnosis not present

## 2021-06-09 LAB — BASIC METABOLIC PANEL
Anion gap: 6 (ref 5–15)
BUN: 22 mg/dL (ref 8–23)
CO2: 27 mmol/L (ref 22–32)
Calcium: 8.6 mg/dL — ABNORMAL LOW (ref 8.9–10.3)
Chloride: 103 mmol/L (ref 98–111)
Creatinine, Ser: 1.3 mg/dL — ABNORMAL HIGH (ref 0.44–1.00)
GFR, Estimated: 40 mL/min — ABNORMAL LOW (ref >60)
Glucose, Bld: 144 mg/dL — ABNORMAL HIGH (ref 70–99)
Potassium: 3.5 mmol/L (ref 3.5–5.1)
Sodium: 136 mmol/L (ref 135–145)

## 2021-06-09 LAB — URINE CULTURE: Culture: <10000 — AB

## 2021-06-09 MED ORDER — ACETAMINOPHEN 325 MG PO TABS
650.0000 mg | ORAL_TABLET | ORAL | Status: DC | PRN
  Administered 2021-06-09: 650 mg via ORAL
  Filled 2021-06-09: qty 2

## 2021-06-09 MED ORDER — GUAIFENESIN-DM 100-10 MG/5ML PO SYRP
5.0000 mL | ORAL_SOLUTION | ORAL | Status: DC | PRN
  Administered 2021-06-09 – 2021-06-15 (×2): 5 mL via ORAL
  Filled 2021-06-09: qty 10
  Filled 2021-06-09: qty 5

## 2021-06-09 NOTE — NC FL2 (Signed)
MEDICAID FL2 LEVEL OF CARE SCREENING TOOL     IDENTIFICATION  Patient Name: Erin Carr Birthdate: 04-29-1930 Sex: female Admission Date (Current Location): 06/07/2021  [facility] and [place] Number:  Herbalist and Address:  [facility],  [place], [place]      Provider Number: [id number]  Attending Physician Name and Address:  Patrecia Pour, MD  Relative Name and Phone Number:  Crist Fat    [phone]    Current Level of Care: Hospital Recommended Level of Care: [place] Prior Approval Number:    Date Approved/Denied:   PASRR Number: [id number]  Discharge Plan: SNF    Current Diagnoses: Patient Active Problem List   Diagnosis Date Noted   Weakness of both legs 74/25/9563   Chronic systolic CHF (congestive heart failure) ([place]) 06/08/2021   History of lung cancer 06/08/2021   AKI (acute kidney injury) ([place]) 06/08/2021   UTI (urinary tract infection) 06/07/2021   Overweight with body mass index (BMI) 25.0-29.9 04/03/2018   History of cystocele 04/03/2018   Candidiasis of vagina 04/03/2018   Diabetes mellitus ([place]) 04/03/2018   Hyperlipidemia 04/03/2018   Hypertensive disorder 04/03/2018   Postmenopausal bleeding 04/03/2018   Urinary incontinence 04/03/2018   Acute CHF (congestive heart failure) ([place]) 10/22/2017   Foot drop 10/22/2017   Facial droop 10/22/2017   NSTEMI (non-ST elevated myocardial infarction) ([place]) 10/22/2017   Fall 10/22/2017   Leukocytosis 10/22/2017   Radiation pneumonitis ([place]) 10/22/2017   Foot drop, right    Shortness of breath 08/09/2017   Microcytic anemia 07/18/2017   CAP (community acquired pneumonia) 07/18/2017   Asthma, chronic, unspecified asthma severity, with acute exacerbation 07/18/2017   Type 2 diabetes mellitus without complication ([place]) 87/56/4332   Adenocarcinoma of right lung, stage 1 ([place]) 11/16/2016   Neoplasm of uncertain behavior of  right upper lobe of lung 12/01/2015   Positive PPD    Pelvic prolapse 12/16/2014    Orientation RESPIRATION BLADDER Height & Weight     Self, Time, Situation, Place  Normal External catheter Weight:   Height:     BEHAVIORAL SYMPTOMS/MOOD NEUROLOGICAL BOWEL NUTRITION STATUS   (none)  (none) Continent Diet (see d/c summary)  AMBULATORY STATUS COMMUNICATION OF NEEDS Skin   Extensive Assist Verbally Normal                       Personal Care Assistance Level of Assistance  Bathing, Feeding, Dressing Bathing Assistance: Maximum assistance Feeding assistance: Independent Dressing Assistance: Limited assistance     Functional Limitations Info  Sight, Hearing, Speech Sight Info: Adequate Hearing Info: Adequate Speech Info: Adequate    SPECIAL CARE FACTORS FREQUENCY  PT (By licensed PT), OT (By licensed OT)     PT Frequency: 5X/W OT Frequency: 5X/W            Contractures Contractures Info: Not present    Additional Factors Info  Code Status, Allergies Code Status Info: full Allergies Info: NKA           Current Medications (06/09/2021):  This is the current hospital active medication list Current Facility-Administered Medications  Medication Dose Route Frequency Provider Last Rate Last Admin   acetaminophen (TYLENOL) tablet 650 mg  650 mg Oral Q4H PRN Patrecia Pour, MD       amLODipine (NORVASC) tablet 10 mg  10 mg Oral Daily Tu, Ching T, DO   10 mg at 06/09/21 1202   aspirin EC  tablet 81 mg  81 mg Oral Daily Tu, Ching T, DO   81 mg at 06/09/21 1201   atorvastatin (LIPITOR) tablet 40 mg  40 mg Oral q1800 Tu, Ching T, DO   40 mg at 06/08/21 1635   brimonidine (ALPHAGAN) 0.2 % ophthalmic solution 1 drop  1 drop Left Eye BID Tu, Ching T, DO   1 drop at 06/09/21 1203   cefTRIAXone (ROCEPHIN) 1 g in sodium chloride 0.9 % 100 mL IVPB  1 g Intravenous Q24H Tu, Ching T, DO 200 mL/hr at 06/08/21 2343 1 g at 06/08/21 2343   enoxaparin (LOVENOX) injection 40 mg  40 mg  Subcutaneous Q24H Tu, Ching T, DO   40 mg at 06/09/21 1202   gabapentin (NEURONTIN) capsule 400 mg  400 mg Oral BID Tu, Ching T, DO   400 mg at 06/09/21 1202   guaiFENesin-dextromethorphan (ROBITUSSIN DM) 100-10 MG/5ML syrup 5 mL  5 mL Oral Q4H PRN Tu, Ching T, DO   5 mL at 06/09/21 0143   latanoprost (XALATAN) 0.005 % ophthalmic solution 1 drop  1 drop Left Eye QHS Tu, Ching T, DO   1 drop at 06/08/21 2144   metoprolol tartrate (LOPRESSOR) tablet 25 mg  25 mg Oral BID Tu, Ching T, DO   25 mg at 06/09/21 1202   potassium chloride (KLOR-CON) CR tablet 10 mEq  10 mEq Oral Daily Tu, Ching T, DO   10 mEq at 06/09/21 1202   [START ON 06/13/2021] Vitamin D (Ergocalciferol) (DRISDOL) capsule 50,000 Units  50,000 Units Oral Weekly Tu, Ching T, DO         Discharge Medications: Please see discharge summary for a list of discharge medications.  Relevant Imaging Results:  Relevant Lab Results:   Additional Information [place], [place]

## 2021-06-09 NOTE — Progress Notes (Signed)
Physical Therapy Treatment Patient Details Name: Erin Carr MRN: [id number] DOB: 1930-09-10 Today's Date: 06/09/2021    History of Present Illness Erin Carr is a 87 y.o. female with medical history significant for CVA with right LE foot drop, CHF, NSTEMI, NSCLC of the right lung s/p radiotherapy in remission, hypertension, asthma, type 2 diabetes, anemia and hyperlipidemia who presents with new onset lower extremity weakness suspect secondary to UTI    PT Comments    Assisted pt OOB to ambulate to bathroom and back into room to sit in recliner. Pt needed max assist +1 to don AFO brace needed for ambulation. Pt able to stand from elevated EOB with min guard, pt amb to and from bathroom with min guard +1 with no overt LOB, pt was unsteady and informed pta during session that she has trouble seeing and navigating because she is without one eye. During sit to stand from commode pt with severe anterior postural lean to perform self peri care, with mod reliance on grab bars to maintain balance. Pt would benefit from continued PT to improve her functional mobility and safety. Pt will not have 24/7 care at home, discussed with LPT, now recommending SNF.    Follow Up Recommendations  SNF     Equipment Recommendations  None recommended by PT    Recommendations for Other Services       Precautions / Restrictions Precautions Precautions: Fall Precaution Comments: wears depends Required Braces or Orthoses: Other Brace Other Brace: right AFO Restrictions Weight Bearing Restrictions: No    Mobility  Bed Mobility Overal bed mobility: Needs Assistance Bed Mobility: Supine to Sit     Supine to sit: Min assist     General bed mobility comments: gentle min assist with trunk to sit up    Transfers Overall transfer level: Needs assistance Equipment used: 4-wheeled walker Transfers: Sit to/from Stand Sit to Stand: Min guard         General transfer comment: lateral postural  lean to right to stand while holding onto RW, uncontrolled descent to commode.  Ambulation/Gait Ambulation/Gait assistance: Min guard Gait Distance (Feet): 10 Feet Assistive device: 4-wheeled walker Gait Pattern/deviations: Step-to pattern;Step-through pattern Gait velocity: decr   General Gait Details: some unsteadiness noted during amb, but no overt LOB   Stairs             Wheelchair Mobility    Modified Rankin (Stroke Patients Only)       Balance Overall balance assessment: History of Falls;Needs assistance Sitting-balance support: Feet supported;Bilateral upper extremity supported Sitting balance-Leahy Scale: Fair     Standing balance support: During functional activity;Bilateral upper extremity supported Standing balance-Leahy Scale: Poor Standing balance comment: reliant on UE's                            Cognition Arousal/Alertness: Awake/alert Behavior During Therapy: WFL for tasks assessed/performed Overall Cognitive Status: Within Functional Limits for tasks assessed                                        Exercises      General Comments        Pertinent Vitals/Pain Pain Assessment: No/denies pain    Home Living                      Prior Function  PT Goals (current goals can now be found in the care plan section) Acute Rehab PT Goals Patient Stated Goal: to walk and go home PT Goal Formulation: With patient Time For Goal Achievement: 06/22/21 Potential to Achieve Goals: Good    Frequency    Min 3X/week      PT Plan      Co-evaluation              AM-PAC PT "6 Clicks" Mobility   Outcome Measure  Help needed turning from your back to your side while in a flat bed without using bedrails?: A Little Help needed moving from lying on your back to sitting on the side of a flat bed without using bedrails?: A Little Help needed moving to and from a bed to a chair (including a  wheelchair)?: A Little Help needed standing up from a chair using your arms (e.g., wheelchair or bedside chair)?: A Little Help needed to walk in hospital room?: A Little Help needed climbing 3-5 steps with a railing? : A Lot 6 Click Score: 17    End of Session Equipment Utilized During Treatment: Gait belt Activity Tolerance: Patient tolerated treatment well Patient left: in chair;with call bell/phone within reach;with chair alarm set Nurse Communication: Mobility status PT Visit Diagnosis: Unsteadiness on feet (R26.81);Difficulty in walking, not elsewhere classified (R26.2)     Time:  -     Charges:                        Ernst Spell, PTA Student  Acute Rehabilitation Services Pager : [phone] Office : [place]    Ernst Spell 06/09/2021, 1:41 PM

## 2021-06-09 NOTE — TOC Initial Note (Signed)
Transition of Care [facility]) - Initial/Assessment Note    Patient Details  Name: Erin Carr MRN: [id number] Date of Birth: June 03, 1930  Transition of [facility]) CM/SW Contact:    Trish Mage, LCSW Phone Number: 06/09/2021, 11:17 AM  Clinical Narrative:   Patient seen in follow up to PT recommendation of [facility] PT/24 hour supservision.  Ms Brazil lives alone, cousin is next door and comes to help her 3X daily with meals, meds. Ms Spilman understands the recommendation of 24/7 supervision, states she knows her cousin is unable to provide that, gave me permission to call.  Christeen Douglas [phone] confirms that she is unable to be there round the clock, volunteered to call other family members to ask if they are available.  Called back in 20 minutes to let me know that no one is available. Circled back to patient, who states she is open to going to SNF since she has been so weak, prefers [facility] if possible as her twin sister is in LTC there. Bed search initiated. TOC will continue to follow during the course of hospitalization.              Expected Discharge Plan: Skilled Nursing Facility Barriers to Discharge: SNF Pending bed offer   Patient Goals and CMS Choice     Choice offered to / list presented to : Patient  Expected Discharge Plan and Services Expected Discharge Plan: [place]   Discharge Planning Services: CM Consult Post Acute Care Choice: [place] Living arrangements for the past 2 months: Single Family Home Expected Discharge Date: 06/09/21                                    Prior Living Arrangements/Services Living arrangements for the past 2 months: Single Family Home Lives with:: Self Patient language and need for interpreter reviewed:: Yes        Need for Family Participation in Patient Care: Yes (Comment) Care giver support system in place?: Yes (comment) Current home services: DME Criminal Activity/Legal Involvement  Pertinent to Current Situation/Hospitalization: No - Comment as needed  Activities of Daily Living Home Assistive Devices/Equipment: Bedside commode/3-in-1, Eyeglasses, Cane (specify quad or straight), Walker (specify type), Shower chair with back, Wheelchair, Brace (specify type) (right lower extremity brace, single point cane, front wheeled walker) ADL Screening (condition at time of admission) Patient's cognitive ability adequate to safely complete daily activities?: Yes Is the patient deaf or have difficulty hearing?: No Does the patient have difficulty seeing, even when wearing glasses/contacts?: Yes Does the patient have difficulty concentrating, remembering, or making decisions?: Yes Patient able to express need for assistance with ADLs?: Yes Does the patient have difficulty dressing or bathing?: Yes Independently performs ADLs?: No Communication: Independent Dressing (OT): Needs assistance Is this a change from baseline?: Change from baseline, expected to last >3 days Grooming: Independent Feeding: Independent Bathing: Needs assistance Is this a change from baseline?: Change from baseline, expected to last >3 days Toileting: Needs assistance Is this a change from baseline?: Change from baseline, expected to last >3days In/Out Bed: Needs assistance Is this a change from baseline?: Change from baseline, expected to last >3 days Walks in Home: Needs assistance Is this a change from baseline?: Change from baseline, expected to last >3 days Does the patient have difficulty walking or climbing stairs?: Yes (secondary to weakness) Weakness of Legs: Both Weakness of Arms/Hands: None  Permission Sought/Granted Permission sought to share information with : Family Supports Permission granted to share information with : Yes, Verbal Permission Granted  Share Information with NAME: Crist Fat     [phone]           Emotional Assessment Appearance:: Appears stated  age Attitude/Demeanor/Rapport: Engaged Affect (typically observed): Appropriate Orientation: : Oriented to Self, Oriented to Place, Oriented to Situation Alcohol / Substance Use: Not Applicable Psych Involvement: No (comment)  Admission diagnosis:  UTI (urinary tract infection) [N39.0] Acute cystitis without hematuria [N30.00] Generalized weakness [R53.1] Patient Active Problem List   Diagnosis Date Noted   Weakness of both legs 43/32/9518   Chronic systolic CHF (congestive heart failure) ([place]) 06/08/2021   History of lung cancer 06/08/2021   AKI (acute kidney injury) ([place]) 06/08/2021   UTI (urinary tract infection) 06/07/2021   Overweight with body mass index (BMI) 25.0-29.9 04/03/2018   History of cystocele 04/03/2018   Candidiasis of vagina 04/03/2018   Diabetes mellitus ([place]) 04/03/2018   Hyperlipidemia 04/03/2018   Hypertensive disorder 04/03/2018   Postmenopausal bleeding 04/03/2018   Urinary incontinence 04/03/2018   Acute CHF (congestive heart failure) ([place]) 10/22/2017   Foot drop 10/22/2017   Facial droop 10/22/2017   NSTEMI (non-ST elevated myocardial infarction) ([place]) 10/22/2017   Fall 10/22/2017   Leukocytosis 10/22/2017   Radiation pneumonitis ([place]) 10/22/2017   Foot drop, right    Shortness of breath 08/09/2017   Microcytic anemia 07/18/2017   CAP (community acquired pneumonia) 07/18/2017   Asthma, chronic, unspecified asthma severity, with acute exacerbation 07/18/2017   Type 2 diabetes mellitus without complication ([place]) 84/16/6063   Adenocarcinoma of right lung, stage 1 ([place]) 11/16/2016   Neoplasm of uncertain behavior of right upper lobe of lung 12/01/2015   Positive PPD    Pelvic prolapse 12/16/2014   PCP:  Vincente Liberty, MD Pharmacy:   [facility] [place], [place] - [address] AT [place] [address] Phone: [phone] Fax: [phone]     Social Determinants of  Health (SDOH) Interventions    Readmission Risk Interventions No flowsheet data found.

## 2021-06-09 NOTE — Progress Notes (Signed)
PROGRESS NOTE  Erin Carr  BPZ:[id number] DOB: 06-02-1930 DOA: 06/07/2021 PCP: Vincente Liberty, MD  Brief Narrative: Erin Carr is an 87 y.o. female with a history of CVA, right foot drop, NSTEMI, CHF, NSCLC in remission s/p radiotherapy, HTN, T2DM, asthma, and HLD who presented to the ED 7/5 with bilateral leg weakness, referred from urgent care. Also had new urinary incontinence. CT head showed no acute stroke, creatinine elevated at 1.46, and urinalysis with significant pyuria. Ceftriaxone started with IV fluids and pending urine culture. PT consult requested, recommending subacute rehabilitation at SNF which is being pursued. The patient has developed fever despite antibiotics. Urine culture is pending.   Assessment & Plan: Principal Problem:   UTI (urinary tract infection) Active Problems:   Type 2 diabetes mellitus without complication (HCC)   Hypertensive disorder   Weakness of both legs   Chronic systolic CHF (congestive heart failure) (HCC)   History of lung cancer   AKI (acute kidney injury) ([place])  Lower extremity weakness: Improving, suspect infection-related worsening of general functional decline. No back pain or trauma or ongoing incontinence. Neuro exam with attention to legs is reassuring, and overall improved with fluids and abx.  - Treat UTI as below - Continue monitoring exam. No further neuroimaging currently planned - Suspect she will need rehabilitation to return to baseline function. Does not have 24 hour care at home.  UTI:  - Continue ceftriaxone pending culture data. Febrile this AM, though not septic so will not broaden abx at this time. Certainly would tailor antibiotics to culture data.  AKI ruled out, suspect baseline SCr ~1.3 consistent with stage IIIb CKD. - Avoid nephrotoxins.   Chronic HFpEF, HTN: Appears euvolemic. - Continue metoprolol, norvasc. Holding ACEi and lasix. No diuretic currently needed  History of CVA:  - Statin,  ASA  T2DM:  - SSI  HLD:  - Statin  Hx NSCLC: Routine onc f/u.   DVT prophylaxis: Lovenox Code Status: Full Family Communication: Niece by phone today Disposition Plan:  Status is: Observation  The patient will require care spanning > 2 midnights and should be moved to inpatient because: Unsafe d/c plan  Dispo: The patient is from: Home              Anticipated d/c is to: SNF              Patient currently is not medically stable to d/c. - pending urine culture data and defervescence   Difficult to place patient No  Consultants:  None  Procedures:  None  Antimicrobials: Ceftriaxone   Subjective: Feeling stable, had not gotten up by the time of my evaluation this morning, but PT has since worked with her. She has a fever, can't get warm. No new cough, dyspnea, chest pain, abd pain, N/V/D or wounds.   Objective: Vitals:   06/09/21 0940 06/09/21 1040 06/09/21 1133 06/09/21 1449  BP: (!) 140/58   (!) 105/42  Pulse: 69   (!) 56  Resp: 16   16  Temp: (!) 101.2 F (38.4 C) (!) 100.5 F (38.1 C) (!) 100.4 F (38 C) 99.1 F (37.3 C)  TempSrc: Oral Oral Oral Oral  SpO2: 97%   99%    Intake/Output Summary (Last 24 hours) at 06/09/2021 1641 Last data filed at 06/09/2021 0836 Gross per 24 hour  Intake 60 ml  Output 650 ml  Net -590 ml   Gen: Frail elderly female in no distress Pulm: Nonlabored breathing room air. Clear. CV: Regular rate  and rhythm. No murmur, rub, or gallop. No JVD, no dependent edema. GI: Abdomen soft, non-tender, non-distended, with normoactive bowel sounds.  Ext: Warm, no deformities Skin: No rashes, lesions or ulcers on visualized skin. Neuro: Alert and oriented. Diffuse weakness, difficulty rising to seated position, right foot drop stable. No new focal neurological deficits. Psych: Judgement and insight appear fair. Mood euthymic & affect congruent. Behavior is appropriate.    Data Reviewed: I have personally reviewed following labs and imaging  studies  CBC: Recent Labs  Lab 06/07/21 1840 06/08/21 0413  WBC 6.9 5.0  NEUTROABS 4.2  --   HGB 10.9* 10.3*  HCT 35.8* 33.6*  MCV 88.2 87.5  PLT 168 263   Basic Metabolic Panel: Recent Labs  Lab 06/07/21 1840 06/08/21 0413 06/09/21 0456  NA 138 138 136  K 4.2 3.8 3.5  CL 101 100 103  CO2 27 27 27   GLUCOSE 141* 290* 144*  BUN 29* 27* 22  CREATININE 1.46* 1.44* 1.30*  CALCIUM 9.5 9.1 8.6*   GFR: CrCl cannot be calculated (Unknown ideal weight.). Liver Function Tests: Recent Labs  Lab 06/07/21 1840  AST 30  ALT 21  ALKPHOS 67  BILITOT 0.5  PROT 7.4  ALBUMIN 4.1   No results for input(s): LIPASE, AMYLASE in the last 168 hours. No results for input(s): AMMONIA in the last 168 hours. Coagulation Profile: No results for input(s): INR, PROTIME in the last 168 hours. Cardiac Enzymes: No results for input(s): CKTOTAL, CKMB, CKMBINDEX, TROPONINI in the last 168 hours. BNP (last 3 results) No results for input(s): PROBNP in the last 8760 hours. HbA1C: No results for input(s): HGBA1C in the last 72 hours. CBG: Recent Labs  Lab 06/07/21 1612 06/07/21 2009  GLUCAP 181* 115*   Lipid Profile: No results for input(s): CHOL, HDL, LDLCALC, TRIG, CHOLHDL, LDLDIRECT in the last 72 hours. Thyroid Function Tests: No results for input(s): TSH, T4TOTAL, FREET4, T3FREE, THYROIDAB in the last 72 hours. Anemia Panel: Recent Labs    06/08/21 0019  VITAMINB12 194  FOLATE 22.2  TIBC 356  IRON 31   Urine analysis:    Component Value Date/Time   COLORURINE YELLOW 06/07/2021 2202   APPEARANCEUR CLEAR 06/07/2021 2202   LABSPEC 1.010 06/07/2021 2202   PHURINE 7.0 06/07/2021 2202   GLUCOSEU NEGATIVE 06/07/2021 2202   HGBUR MODERATE (A) 06/07/2021 2202   BILIRUBINUR NEGATIVE 06/07/2021 2202   KETONESUR NEGATIVE 06/07/2021 2202   PROTEINUR 30 (A) 06/07/2021 2202   NITRITE NEGATIVE 06/07/2021 2202   LEUKOCYTESUR LARGE (A) 06/07/2021 2202   Recent Results (from the past  240 hour(s))  Urine culture     Status: Abnormal   Collection Time: 06/07/21 10:02 PM   Specimen: Urine, Random  Result Value Ref Range Status   Specimen Description   Final    URINE, RANDOM Performed at [facility], [place] [address], [place], [place] [address]    Special Requests   Final    NONE Performed at [facility], [place] [address], [place], [place] [address]    Culture (A)  Final    <10,000 COLONIES/mL INSIGNIFICANT GROWTH Performed at [place] [address], [place], [place] [address]    Report Status 06/09/2021 FINAL  Final  Resp Panel by RT-PCR (Flu A&B, Covid) Nasopharyngeal Swab     Status: None   Collection Time: 06/07/21 11:19 PM   Specimen: Nasopharyngeal Swab; Nasopharyngeal(NP) swabs in vial transport medium  Result Value Ref Range Status  SARS Coronavirus 2 by RT PCR NEGATIVE NEGATIVE Final    Comment: (NOTE) SARS-CoV-2 target nucleic acids are NOT DETECTED.  The SARS-CoV-2 RNA is generally detectable in upper respiratory specimens during the acute phase of infection. The lowest concentration of SARS-CoV-2 viral copies this assay can detect is 138 copies/mL. A negative result does not preclude SARS-Cov-2 infection and should not be used as the sole basis for treatment or other patient management decisions. A negative result may occur with  improper specimen collection/handling, submission of specimen other than nasopharyngeal swab, presence of viral mutation(s) within the areas targeted by this assay, and inadequate number of viral copies(<138 copies/mL). A negative result must be combined with clinical observations, patient history, and epidemiological information. The expected result is Negative.  Fact Sheet for Patients:  EntrepreneurPulse.com.au  Fact Sheet for Healthcare Providers:  IncredibleEmployment.be  This test is no t yet approved or cleared by the [place] FDA and  has been authorized for detection and/or diagnosis of SARS-CoV-2 by FDA under an Emergency Use Authorization (EUA). This EUA will remain  in effect (meaning this test can be used) for the duration of the COVID-19 declaration under Section 564(b)(1) of the Act, 21 U.S.C.section 360bbb-3(b)(1), unless the authorization is terminated  or revoked sooner.       Influenza A by PCR NEGATIVE NEGATIVE Final   Influenza B by PCR NEGATIVE NEGATIVE Final    Comment: (NOTE) The Xpert Xpress SARS-CoV-2/FLU/RSV plus assay is intended as an aid in the diagnosis of influenza from Nasopharyngeal swab specimens and should not be used as a sole basis for treatment. Nasal washings and aspirates are unacceptable for Xpert Xpress SARS-CoV-2/FLU/RSV testing.  Fact Sheet for Patients: EntrepreneurPulse.com.au  Fact Sheet for Healthcare Providers: IncredibleEmployment.be  This test is not yet approved or cleared by the [place] FDA and has been authorized for detection and/or diagnosis of SARS-CoV-2 by FDA under an Emergency Use Authorization (EUA). This EUA will remain in effect (meaning this test can be used) for the duration of the COVID-19 declaration under Section 564(b)(1) of the Act, 21 U.S.C. section 360bbb-3(b)(1), unless the authorization is terminated or revoked.  Performed at [facility], [place] [address] [place], [place] [address]       Radiology Studies: DG Chest 2 View  Result Date: 06/07/2021 CLINICAL DATA:  Weakness history of right lung cancer EXAM: CHEST - 2 VIEW COMPARISON:  10/22/2017, CT chest 11/24/2020 FINDINGS: Bandlike opacity in the right upper lobe consistent with post treatment change. No acute airspace disease or effusion. Normal cardiomediastinal silhouette with aortic atherosclerosis. No pneumothorax IMPRESSION: No active cardiopulmonary disease. Bandlike opacity in the right upper lobe consistent  with post treatment changes. Electronically Signed   By: Donavan Foil M.D.   On: 06/07/2021 19:16   CT Head Wo Contrast  Result Date: 06/07/2021 CLINICAL DATA:  Leg weakness.  Stroke suspected.  History of stroke. EXAM: CT HEAD WITHOUT CONTRAST TECHNIQUE: Contiguous axial images were obtained from the base of the skull through the vertex without intravenous contrast. COMPARISON:  CT 10/22/2017.  MRI brain 10/22/2017 FINDINGS: Brain: No evidence of acute infarction, hemorrhage, hydrocephalus, extra-axial collection or mass lesion/mass effect. Mild diffuse cerebral atrophy. Mild ventricular dilatation consistent with central atrophy. Patchy low-attenuation changes in the deep white matter consistent small vessel ischemia. Vascular: Moderate intracranial arterial vascular calcifications. Skull: Calvarium appears intact. Sinuses/Orbits: Mucosal thickening in the paranasal sinuses. Possible air-fluid level in the left maxillary antrum. Mastoid air cells are clear. Other: None.  IMPRESSION: 1. No acute intracranial abnormalities. Chronic atrophy and small vessel ischemic changes. 2. Inflammatory changes in the paranasal sinuses. Electronically Signed   By: Lucienne Capers M.D.   On: 06/07/2021 19:30    Scheduled Meds:  amLODipine  10 mg Oral Daily   aspirin EC  81 mg Oral Daily   atorvastatin  40 mg Oral q1800   brimonidine  1 drop Left Eye BID   enoxaparin (LOVENOX) injection  40 mg Subcutaneous Q24H   gabapentin  400 mg Oral BID   latanoprost  1 drop Left Eye QHS   metoprolol tartrate  25 mg Oral BID   potassium chloride  10 mEq Oral Daily   [START ON 06/13/2021] Vitamin D (Ergocalciferol)  50,000 Units Oral Weekly   Continuous Infusions:  cefTRIAXone (ROCEPHIN)  IV 1 g (06/08/21 2343)     LOS: 0 days   Time spent: 25 minutes.  Patrecia Pour, MD [facility] [email or web address] 06/09/2021, 4:41 PM

## 2021-06-10 DIAGNOSIS — I5022 Chronic systolic (congestive) heart failure: Secondary | ICD-10-CM | POA: Diagnosis not present

## 2021-06-10 DIAGNOSIS — N179 Acute kidney failure, unspecified: Secondary | ICD-10-CM | POA: Diagnosis not present

## 2021-06-10 DIAGNOSIS — Z85118 Personal history of other malignant neoplasm of bronchus and lung: Secondary | ICD-10-CM | POA: Diagnosis not present

## 2021-06-10 DIAGNOSIS — N3000 Acute cystitis without hematuria: Secondary | ICD-10-CM | POA: Diagnosis not present

## 2021-06-10 LAB — RESP PANEL BY RT-PCR (FLU A&B, COVID) ARPGX2
Influenza A by PCR: NEGATIVE
Influenza B by PCR: NEGATIVE
SARS Coronavirus 2 by RT PCR: POSITIVE — AB

## 2021-06-10 LAB — CBC WITH DIFFERENTIAL/PLATELET
Abs Immature Granulocytes: 0.01 10*3/uL (ref 0.00–0.07)
Basophils Absolute: 0 10*3/uL (ref 0.0–0.1)
Basophils Relative: 0 %
Eosinophils Absolute: 0.1 10*3/uL (ref 0.0–0.5)
Eosinophils Relative: 5 %
HCT: 32.3 % — ABNORMAL LOW (ref 36.0–46.0)
Hemoglobin: 10 g/dL — ABNORMAL LOW (ref 12.0–15.0)
Immature Granulocytes: 0 %
Lymphocytes Relative: 35 %
Lymphs Abs: 1.1 10*3/uL (ref 0.7–4.0)
MCH: 27 pg (ref 26.0–34.0)
MCHC: 31 g/dL (ref 30.0–36.0)
MCV: 87.3 fL (ref 80.0–100.0)
Monocytes Absolute: 0.5 10*3/uL (ref 0.1–1.0)
Monocytes Relative: 15 %
Neutro Abs: 1.4 10*3/uL — ABNORMAL LOW (ref 1.7–7.7)
Neutrophils Relative %: 45 %
Platelets: 135 10*3/uL — ABNORMAL LOW (ref 150–400)
RBC: 3.7 MIL/uL — ABNORMAL LOW (ref 3.87–5.11)
RDW: 13.9 % (ref 11.5–15.5)
WBC: 3.1 10*3/uL — ABNORMAL LOW (ref 4.0–10.5)
nRBC: 0 % (ref 0.0–0.2)

## 2021-06-10 LAB — C-REACTIVE PROTEIN: CRP: 1.4 mg/dL — ABNORMAL HIGH (ref <1.0)

## 2021-06-10 LAB — PROCALCITONIN: Procalcitonin: <0.1 ng/mL

## 2021-06-10 LAB — SARS CORONAVIRUS 2 (TAT 6-24 HRS): SARS Coronavirus 2: POSITIVE — AB

## 2021-06-10 MED ORDER — SODIUM CHLORIDE 0.9 % IV SOLN
200.0000 mg | Freq: Once | INTRAVENOUS | Status: AC
  Administered 2021-06-10: 200 mg via INTRAVENOUS
  Filled 2021-06-10: qty 40

## 2021-06-10 MED ORDER — SODIUM CHLORIDE 0.9 % IV SOLN
100.0000 mg | Freq: Every day | INTRAVENOUS | Status: AC
  Administered 2021-06-11 – 2021-06-12 (×2): 100 mg via INTRAVENOUS
  Filled 2021-06-10 (×2): qty 20

## 2021-06-10 MED ORDER — GABAPENTIN 100 MG PO CAPS: 200.0000 mg | ORAL_CAPSULE | Freq: Two times a day (BID) | ORAL | Status: AC

## 2021-06-10 MED ORDER — FUROSEMIDE 20 MG PO TABS
40.0000 mg | ORAL_TABLET | Freq: Two times a day (BID) | ORAL | Status: DC
Start: 2021-06-10 — End: 2021-06-20

## 2021-06-10 MED ORDER — COVID-19 MRNA VACC (MODERNA) 50 MCG/0.25ML IM SUSP
0.2500 mL | Freq: Once | INTRAMUSCULAR | Status: DC
  Filled 2021-06-10: qty 0.25

## 2021-06-10 MED ORDER — NIRMATRELVIR/RITONAVIR (PAXLOVID) TABLET (RENAL DOSING): 2.0000 | ORAL_TABLET | Freq: Two times a day (BID) | ORAL | Status: DC

## 2021-06-10 MED ORDER — CEPHALEXIN 500 MG PO CAPS: 500.0000 mg | ORAL_CAPSULE | Freq: Two times a day (BID) | ORAL | 0 refills | Status: DC

## 2021-06-10 MED ORDER — ASCORBIC ACID 500 MG PO TABS
500.0000 mg | ORAL_TABLET | Freq: Every day | ORAL | Status: DC
  Administered 2021-06-10 – 2021-06-20 (×11): 500 mg via ORAL
  Filled 2021-06-10 (×12): qty 1

## 2021-06-10 MED ORDER — ZINC SULFATE 220 (50 ZN) MG PO CAPS
220.0000 mg | ORAL_CAPSULE | Freq: Every day | ORAL | Status: DC
  Administered 2021-06-10 – 2021-06-20 (×11): 220 mg via ORAL
  Filled 2021-06-10 (×11): qty 1

## 2021-06-10 NOTE — TOC Transition Note (Signed)
Transition of Care [facility]) - CM/SW Discharge Note   Patient Details  Name: Erin Carr MRN: [id number] Date of Birth: 02-16-1930  [facility]) CM/SW Contact:  Trish Mage, LCSW Phone Number: 06/10/2021, 3:35 PM   Clinical Narrative:   Patient who is stable for d/c will transfer to [facility] today if d/c summary is done by 4, Covid test returns negative, patient is given COVID booster shot.  Spoke with Vida Roller at [facility] who states she would prefer the PCR, so will plan on sending patient tomorrow. TOC, call Kelli to coordinate as she is on call. TOC will continue to follow during the course of hospitalization.     Final next level of care: Skilled Nursing Facility Barriers to Discharge: Other (must enter comment) (COVID test results, d/c summary)   Patient Goals and CMS Choice     Choice offered to / list presented to : Patient  Discharge Placement                       Discharge Plan and Services   Discharge Planning Services: CM Consult Post Acute Care Choice: [place]                               Social Determinants of Health (SDOH) Interventions     Readmission Risk Interventions No flowsheet data found.

## 2021-06-10 NOTE — Progress Notes (Signed)
Lab Results  Component Value Date   [place] (A) 06/10/2021   [place] NEGATIVE 06/07/2021   No results found for: SARSCOV2

## 2021-06-10 NOTE — Discharge Summary (Signed)
Physician Discharge Summary  Erin Carr QPY:[id number] DOB: [birth year] DOA: 06/07/2021  PCP: Vincente Liberty, MD  Admit date: 06/07/2021 Discharge date: 06/10/2021  Admitted From: Home Disposition: SNF   Recommendations for Outpatient Follow-up:  Follow up with PCP in 1-2 weeks Recommend repeat CBC in next week  Home Health: N/A Equipment/Devices: Per SNF Discharge Condition: Stable CODE STATUS: Full Diet recommendation: Heart healthy, carb-modified  Brief/Interim Summary: Erin Carr is an 87 y.o. female with a history of CVA, right foot drop, NSTEMI, CHF, NSCLC in remission s/p radiotherapy, HTN, T2DM, asthma, and HLD who presented to the ED 7/5 with bilateral leg weakness, referred from urgent care. Also had new urinary incontinence. CT head showed no acute stroke, creatinine elevated at 1.46, and urinalysis with significant pyuria. Ceftriaxone started with IV fluids and pending urine culture. PT consult requested, recommending subacute rehabilitation at SNF which is being pursued. The patient developed fever which has resolved with continuation of antibiotics for UTI.   Discharge Diagnoses:  Principal Problem:   UTI (urinary tract infection) Active Problems:   Type 2 diabetes mellitus without complication (HCC)   Hypertensive disorder   Weakness of both legs   Chronic systolic CHF (congestive heart failure) (HCC)   History of lung cancer   AKI (acute kidney injury) ([place])  Lower extremity weakness: Improving, suspect infection-related worsening of general functional decline. No back pain or trauma or ongoing incontinence. Neuro exam with attention to legs is reassuring, and overall improved with fluids and abx. - Treat UTI as below - Continue monitoring exam. No further neuroimaging currently planned - She will need rehabilitation to return to baseline function. Does not have 24 hour care at home.   UTI: - Received ceftriaxone 7/5 - 7/7. Will complete course with  keflex 500mg  po BID x7 days. Urine culture unhelpful.     AKI ruled out, suspect baseline SCr ~1.3 consistent with stage IIIb CKD. - Avoid nephrotoxins.   Chronic HFpEF, HTN: Appears euvolemic. - Continue home medications. Recommend lasix 40mg  po BID and following clinical exam as well as renal function. This dose may be able to be reduced. Continue K supplement when taking lasix. - No change to lisinopril dose (20mg  po BID), though would consider dose consolidation.    History of CVA: - Statin, ASA  Neuropathy/right foot drop:  - Decrease gabapentin dosing from 400mg  po BID to 200mg  po BID given renal dysfunction.    T2DM: - Due to impaired renal function, we've stopped metformin. Last HbA1c in our system was 4.8% and she's exhibited controlled glucose here without intervention.    HLD: - Statin   Hx NSCLC: Routine onc f/u.  Glaucoma:  - Continue gtt's  Vitamin D deficiency:  - Continue home supplement  Discharge Instructions  Allergies as of 06/10/2021   No Known Allergies      Medication List     STOP taking these medications    metFORMIN 500 MG 24 hr tablet Commonly known as: GLUCOPHAGE-XR       TAKE these medications    albuterol 108 (90 Base) MCG/ACT inhaler Commonly known as: VENTOLIN HFA Inhale 1 puff into the lungs every 4 (four) hours as needed for wheezing or shortness of breath.   amLODipine 10 MG tablet Commonly known as: NORVASC Take 10 mg by mouth daily.   aspirin EC 81 MG tablet Take 81 mg by mouth daily.   atorvastatin 40 MG tablet Commonly known as: LIPITOR Take 1 tablet (40 mg total) by mouth daily  at 6 PM.   brimonidine 0.2 % ophthalmic solution Commonly known as: ALPHAGAN Place 1 drop into the left eye 2 (two) times daily.   cephALEXin 500 MG capsule Commonly known as: KEFLEX Take 1 capsule (500 mg total) by mouth 2 (two) times daily.   ergocalciferol 1.25 MG (50000 UT) capsule Commonly known as: VITAMIN D2 Take 50,000 Units  by mouth once a week. On Monday   furosemide 20 MG tablet Commonly known as: LASIX Take 2 tablets (40 mg total) by mouth 2 (two) times daily.   gabapentin 100 MG capsule Commonly known as: NEURONTIN Take 2 capsules (200 mg total) by mouth 2 (two) times daily. What changed:  medication strength how much to take   latanoprost 0.005 % ophthalmic solution Commonly known as: XALATAN Place 1 drop into the left eye at bedtime.   lisinopril 20 MG tablet Commonly known as: ZESTRIL Take 20 mg by mouth 2 (two) times daily.   potassium chloride 10 MEQ tablet Commonly known as: KLOR-CON Take 10 mEq by mouth daily.       ASK your doctor about these medications    metoprolol tartrate 25 MG tablet Commonly known as: LOPRESSOR Take 0.5 tablets (12.5 mg total) by mouth 2 (two) times daily.        Follow-up Information     Vincente Liberty, MD Follow up.   Specialty: Pulmonary Disease Contact information: [place] [address] [phone]                No Known Allergies  Consultations: None  Procedures/Studies: DG Chest 2 View  Result Date: 06/07/2021 CLINICAL DATA:  Weakness history of right lung cancer EXAM: CHEST - 2 VIEW COMPARISON:  10/22/2017, CT chest 11/24/2020 FINDINGS: Bandlike opacity in the right upper lobe consistent with post treatment change. No acute airspace disease or effusion. Normal cardiomediastinal silhouette with aortic atherosclerosis. No pneumothorax IMPRESSION: No active cardiopulmonary disease. Bandlike opacity in the right upper lobe consistent with post treatment changes. Electronically Signed   By: Donavan Foil M.D.   On: 06/07/2021 19:16   CT Head Wo Contrast  Result Date: 06/07/2021 CLINICAL DATA:  Leg weakness.  Stroke suspected.  History of stroke. EXAM: CT HEAD WITHOUT CONTRAST TECHNIQUE: Contiguous axial images were obtained from the base of the skull through the vertex without intravenous contrast.  COMPARISON:  CT 10/22/2017.  MRI brain 10/22/2017 FINDINGS: Brain: No evidence of acute infarction, hemorrhage, hydrocephalus, extra-axial collection or mass lesion/mass effect. Mild diffuse cerebral atrophy. Mild ventricular dilatation consistent with central atrophy. Patchy low-attenuation changes in the deep white matter consistent small vessel ischemia. Vascular: Moderate intracranial arterial vascular calcifications. Skull: Calvarium appears intact. Sinuses/Orbits: Mucosal thickening in the paranasal sinuses. Possible air-fluid level in the left maxillary antrum. Mastoid air cells are clear. Other: None. IMPRESSION: 1. No acute intracranial abnormalities. Chronic atrophy and small vessel ischemic changes. 2. Inflammatory changes in the paranasal sinuses. Electronically Signed   By: Lucienne Capers M.D.   On: 06/07/2021 19:30     Subjective: Feels fine but having some nasal congestion. No rhinorrhea or cough or sore throat or fever. Last fever was >24 hours ago without further antipyretics. Eager to regain strength with PT. No chest pain or dyspnea.   Discharge Exam: Vitals:   06/10/21 1106 06/10/21 1308  BP: (!) 161/62 (!) 115/52  Pulse: 68 (!) 56  Resp:  16  Temp:  98.6 F (37 C)  SpO2:  99%   General: Pt is alert,  awake, not in acute distress Cardiovascular: RRR, S1/S2 +, no rubs, no gallops Respiratory: CTA bilaterally, no wheezing, no rhonchi Abdominal: Soft, NT, ND, bowel sounds + Extremities: No edema, no cyanosis  Labs: BNP (last 3 results) No results for input(s): BNP in the last 8760 hours. Basic Metabolic Panel: Recent Labs  Lab 06/07/21 1840 06/08/21 0413 06/09/21 0456  NA 138 138 136  K 4.2 3.8 3.5  CL 101 100 103  CO2 27 27 27   GLUCOSE 141* 290* 144*  BUN 29* 27* 22  CREATININE 1.46* 1.44* 1.30*  CALCIUM 9.5 9.1 8.6*   Liver Function Tests: Recent Labs  Lab 06/07/21 1840  AST 30  ALT 21  ALKPHOS 67  BILITOT 0.5  PROT 7.4  ALBUMIN 4.1   No results  for input(s): LIPASE, AMYLASE in the last 168 hours. No results for input(s): AMMONIA in the last 168 hours. CBC: Recent Labs  Lab 06/07/21 1840 06/08/21 0413 06/10/21 0505  WBC 6.9 5.0 3.1*  NEUTROABS 4.2  --  1.4*  HGB 10.9* 10.3* 10.0*  HCT 35.8* 33.6* 32.3*  MCV 88.2 87.5 87.3  PLT 168 158 135*   Cardiac Enzymes: No results for input(s): CKTOTAL, CKMB, CKMBINDEX, TROPONINI in the last 168 hours. BNP: Invalid input(s): POCBNP CBG: Recent Labs  Lab 06/07/21 1612 06/07/21 2009  GLUCAP 181* 115*   D-Dimer No results for input(s): DDIMER in the last 72 hours. Hgb A1c No results for input(s): HGBA1C in the last 72 hours. Lipid Profile No results for input(s): CHOL, HDL, LDLCALC, TRIG, CHOLHDL, LDLDIRECT in the last 72 hours. Thyroid function studies No results for input(s): TSH, T4TOTAL, T3FREE, THYROIDAB in the last 72 hours.  Invalid input(s): FREET3 Anemia work up Recent Labs    06/08/21 0019  VITAMINB12 194  FOLATE 22.2  TIBC 356  IRON 31   Urinalysis    Component Value Date/Time   COLORURINE YELLOW 06/07/2021 2202   APPEARANCEUR CLEAR 06/07/2021 2202   LABSPEC 1.010 06/07/2021 2202   PHURINE 7.0 06/07/2021 2202   GLUCOSEU NEGATIVE 06/07/2021 2202   HGBUR MODERATE (A) 06/07/2021 2202   BILIRUBINUR NEGATIVE 06/07/2021 2202   KETONESUR NEGATIVE 06/07/2021 2202   PROTEINUR 30 (A) 06/07/2021 2202   NITRITE NEGATIVE 06/07/2021 2202   LEUKOCYTESUR LARGE (A) 06/07/2021 2202    Microbiology Recent Results (from the past 240 hour(s))  Urine culture     Status: Abnormal   Collection Time: 06/07/21 10:02 PM   Specimen: Urine, Random  Result Value Ref Range Status   Specimen Description   Final    URINE, RANDOM Performed at [facility], [place] [address], [place], [place] [address]    Special Requests   Final    NONE Performed at [facility], [place] [address] [place], [place] [address]    Culture (A)  Final     <10,000 COLONIES/mL INSIGNIFICANT GROWTH Performed at [facility] [address], [place], [place] [address]    Report Status 06/09/2021 FINAL  Final  Resp Panel by RT-PCR (Flu A&B, Covid) Nasopharyngeal Swab     Status: None   Collection Time: 06/07/21 11:19 PM   Specimen: Nasopharyngeal Swab; Nasopharyngeal(NP) swabs in vial transport medium  Result Value Ref Range Status   SARS Coronavirus 2 by RT PCR NEGATIVE NEGATIVE Final    Comment: (NOTE) SARS-CoV-2 target nucleic acids are NOT DETECTED.  The SARS-CoV-2 RNA is generally detectable in upper respiratory specimens during the acute phase of infection. The lowest  concentration of SARS-CoV-2 viral copies this assay can detect is 138 copies/mL. A negative result does not preclude SARS-Cov-2 infection and should not be used as the sole basis for treatment or other patient management decisions. A negative result may occur with  improper specimen collection/handling, submission of specimen other than nasopharyngeal swab, presence of viral mutation(s) within the areas targeted by this assay, and inadequate number of viral copies(<138 copies/mL). A negative result must be combined with clinical observations, patient history, and epidemiological information. The expected result is Negative.  Fact Sheet for Patients:  EntrepreneurPulse.com.au  Fact Sheet for Healthcare Providers:  IncredibleEmployment.be  This test is no t yet approved or cleared by the [place] FDA and  has been authorized for detection and/or diagnosis of SARS-CoV-2 by FDA under an Emergency Use Authorization (EUA). This EUA will remain  in effect (meaning this test can be used) for the duration of the COVID-19 declaration under Section 564(b)(1) of the Act, 21 U.S.C.section 360bbb-3(b)(1), unless the authorization is terminated  or revoked sooner.       Influenza A by PCR NEGATIVE NEGATIVE Final   Influenza B by  PCR NEGATIVE NEGATIVE Final    Comment: (NOTE) The Xpert Xpress SARS-CoV-2/FLU/RSV plus assay is intended as an aid in the diagnosis of influenza from Nasopharyngeal swab specimens and should not be used as a sole basis for treatment. Nasal washings and aspirates are unacceptable for Xpert Xpress SARS-CoV-2/FLU/RSV testing.  Fact Sheet for Patients: EntrepreneurPulse.com.au  Fact Sheet for Healthcare Providers: IncredibleEmployment.be  This test is not yet approved or cleared by the [place] FDA and has been authorized for detection and/or diagnosis of SARS-CoV-2 by FDA under an Emergency Use Authorization (EUA). This EUA will remain in effect (meaning this test can be used) for the duration of the COVID-19 declaration under Section 564(b)(1) of the Act, 21 U.S.C. section 360bbb-3(b)(1), unless the authorization is terminated or revoked.  Performed at [facility], [place] [address] [place], [place] [address]     Time coordinating discharge: Approximately 40 minutes  Patrecia Pour, MD  [facility] 06/10/2021, 3:56 PM

## 2021-06-10 NOTE — Plan of Care (Signed)

## 2021-06-11 DIAGNOSIS — I5022 Chronic systolic (congestive) heart failure: Secondary | ICD-10-CM | POA: Diagnosis not present

## 2021-06-11 DIAGNOSIS — I10 Essential (primary) hypertension: Secondary | ICD-10-CM | POA: Diagnosis not present

## 2021-06-11 DIAGNOSIS — N3000 Acute cystitis without hematuria: Secondary | ICD-10-CM | POA: Diagnosis not present

## 2021-06-11 DIAGNOSIS — Z85118 Personal history of other malignant neoplasm of bronchus and lung: Secondary | ICD-10-CM | POA: Diagnosis not present

## 2021-06-11 LAB — CBC WITH DIFFERENTIAL/PLATELET
Abs Immature Granulocytes: 0.01 10*3/uL (ref 0.00–0.07)
Basophils Absolute: 0 10*3/uL (ref 0.0–0.1)
Basophils Relative: 0 %
Eosinophils Absolute: 0.1 10*3/uL (ref 0.0–0.5)
Eosinophils Relative: 3 %
HCT: 35.2 % — ABNORMAL LOW (ref 36.0–46.0)
Hemoglobin: 11 g/dL — ABNORMAL LOW (ref 12.0–15.0)
Immature Granulocytes: 0 %
Lymphocytes Relative: 32 %
Lymphs Abs: 1.1 10*3/uL (ref 0.7–4.0)
MCH: 27 pg (ref 26.0–34.0)
MCHC: 31.3 g/dL (ref 30.0–36.0)
MCV: 86.3 fL (ref 80.0–100.0)
Monocytes Absolute: 0.5 10*3/uL (ref 0.1–1.0)
Monocytes Relative: 14 %
Neutro Abs: 1.8 10*3/uL (ref 1.7–7.7)
Neutrophils Relative %: 51 %
Platelets: 146 10*3/uL — ABNORMAL LOW (ref 150–400)
RBC: 4.08 MIL/uL (ref 3.87–5.11)
RDW: 13.6 % (ref 11.5–15.5)
WBC: 3.5 10*3/uL — ABNORMAL LOW (ref 4.0–10.5)
nRBC: 0 % (ref 0.0–0.2)

## 2021-06-11 LAB — COMPREHENSIVE METABOLIC PANEL
ALT: 21 U/L (ref 0–44)
AST: 30 U/L (ref 15–41)
Albumin: 3.1 g/dL — ABNORMAL LOW (ref 3.5–5.0)
Alkaline Phosphatase: 52 U/L (ref 38–126)
Anion gap: 7 (ref 5–15)
BUN: 19 mg/dL (ref 8–23)
CO2: 27 mmol/L (ref 22–32)
Calcium: 8.6 mg/dL — ABNORMAL LOW (ref 8.9–10.3)
Chloride: 106 mmol/L (ref 98–111)
Creatinine, Ser: 0.97 mg/dL (ref 0.44–1.00)
GFR, Estimated: 56 mL/min — ABNORMAL LOW (ref >60)
Glucose, Bld: 230 mg/dL — ABNORMAL HIGH (ref 70–99)
Potassium: 3.6 mmol/L (ref 3.5–5.1)
Sodium: 140 mmol/L (ref 135–145)
Total Bilirubin: 0.4 mg/dL (ref 0.3–1.2)
Total Protein: 6.4 g/dL — ABNORMAL LOW (ref 6.5–8.1)

## 2021-06-11 LAB — C-REACTIVE PROTEIN: CRP: 1.4 mg/dL — ABNORMAL HIGH (ref <1.0)

## 2021-06-11 NOTE — TOC Progression Note (Signed)
Transition of Care [facility]) - Progression Note    Patient Details  Name: Erin Carr MRN: [id number] Date of Birth: 17-Jun-1930  [facility] CM/SW Contact  Lennart Pall, LCSW Phone Number: 06/11/2021, 11:33 AM  Clinical Narrative:    Pt now with +COVID.  Have alerted [facility] who can no longer accept.  TOC will continue to follow for dc needs and will see if any area SNFs will consider + patients.   Expected Discharge Plan: Skilled Nursing Facility Barriers to Discharge: Other (must enter comment) (COVID test results, d/c summary)  Expected Discharge Plan and Services Expected Discharge Plan: [place]   Discharge Planning Services: CM Consult Post Acute Care Choice: [place] Living arrangements for the past 2 months: Single Family Home Expected Discharge Date: 06/10/21                                     Social Determinants of Health (SDOH) Interventions    Readmission Risk Interventions No flowsheet data found.

## 2021-06-11 NOTE — Progress Notes (Addendum)
PROGRESS NOTE  Erin Carr  CHE:[id number] DOB: May 06, 1930 DOA: 06/07/2021 PCP: Vincente Liberty, MD  Brief Narrative: Erin Carr is an 87 y.o. female with a history of CVA, right foot drop, NSTEMI, CHF, NSCLC in remission s/p radiotherapy, HTN, T2DM, asthma, and HLD who presented to the ED 7/5 with bilateral leg weakness, referred from urgent care. Also had new urinary incontinence. CT head showed no acute stroke, creatinine elevated at 1.46, and urinalysis with significant pyuria. Ceftriaxone started with IV fluids and pending urine culture. PT consult requested, recommending subacute rehabilitation at SNF which is being pursued. The patient has developed fever despite antibiotics. Urine culture is pending.   Assessment & Plan: Principal Problem:   UTI (urinary tract infection) Active Problems:   Type 2 diabetes mellitus without complication (HCC)   Hypertensive disorder   Weakness of both legs   Chronic systolic CHF (congestive heart failure) (HCC)   History of lung cancer   AKI (acute kidney injury) (Erin Carr)  Lower extremity weakness: Improving, suspect infection-related worsening of general functional decline. No back pain or trauma or ongoing incontinence. Neuro exam with attention to legs is reassuring, and overall improved with fluids and abx.  - Treated UTI - Continue monitoring exam. No further neuroimaging currently planned - Continue PT as much as possible here and possibly at SNF.  UTI:  - Completed ceftriaxone with insignificant growth on culture. Fever explained more by covid-19. Will stop abx.   Covid-19 infection: No evidence of pneumonia. CRP only 1.4. Vaccinated x2. Still at high risk for poor outcome due to age and comorbidities. Renal function too poor for paxlovid. Given hx vaccination, suspect impact of very early (within 6 hours of diagnosis) antiviral would be more beneficial than monoclonal antibody in this setting. D/w family and pt.  - Remdesivir x3 days.  Monitor clinically. Isolate for 10 days per protocol. Will have to pursue covid-accepting facilites vs. intensive PT here and return home at DC.  - Supportive care for congestion.  - Monitor cytopenias (onset coincides with covid positivity)  Stage IIIb CKD: Current CrCl slightly below 76ml/min but baseline likely 30-35ml/min.  - Avoid nephrotoxins.   Chronic HFpEF, HTN: Appears euvolemic. - Continue metoprolol, norvasc. Holding ACEi and lasix. No diuretic currently needed  History of CVA:  - Statin, ASA  T2DM:  - SSI  HLD:  - Statin  Hx NSCLC: Routine onc f/u.   DVT prophylaxis: Lovenox Code Status: Full Family Communication: Niece by phone again 7/9. Disposition Plan:  Status is: Inpatient due to Unsafe d/c plan  Dispo: The patient is from: Home              Anticipated d/c is to: SNF              Patient currently is medically stable to d/c. though placement complicated by covid positivity.      Difficult to place patient No  Consultants:  None  Procedures:  None  Antimicrobials: Ceftriaxone  Remdesivir 7/8 - 7/10.  Subjective: Some nasal congestion and chills, no further fever, no cough, sore throat, dyspnea or chest pain. Niece has the same symptoms without dyspnea, had positive home test last night, and is contacting her PCP.  Objective: Vitals:   06/10/21 2303 06/11/21 0957 06/11/21 0958 06/11/21 1103  BP: 130/67 (!) 165/61 (!) 165/61 (!) 167/59  Pulse: (!) 59  70 63  Resp: 20   16  Temp: 98.6 F (37 C)   98.6 F (37 C)  TempSrc:  SpO2: 99%   100%    Intake/Output Summary (Last 24 hours) at 06/11/2021 1208 Last data filed at 06/10/2021 2021 Gross per 24 hour  Intake 240 ml  Output 1300 ml  Net -1060 ml   Gen: 87 y.o. female in no distress Pulm: Nonlabored breathing room air. Clear. CV: Regular rate and rhythm. No murmur, rub, or gallop. No JVD, no dependent edema. GI: Abdomen soft, non-tender, non-distended, with normoactive bowel sounds.   Ext: Warm, no deformities Skin: No new rashes, lesions or ulcers on visualized skin. Neuro: Alert and oriented. No focal neurological deficits. Psych: Judgement and insight appear fair. Mood euthymic & affect congruent. Behavior is appropriate.    Data Reviewed: I have personally reviewed following labs and imaging studies  CBC: Recent Labs  Lab 06/07/21 1840 06/08/21 0413 06/10/21 0505 06/11/21 0345  WBC 6.9 5.0 3.1* 3.5*  NEUTROABS 4.2  --  1.4* 1.8  HGB 10.9* 10.3* 10.0* 11.0*  HCT 35.8* 33.6* 32.3* 35.2*  MCV 88.2 87.5 87.3 86.3  PLT 168 158 135* 161*   Basic Metabolic Panel: Recent Labs  Lab 06/07/21 1840 06/08/21 0413 06/09/21 0456 06/11/21 0345  NA 138 138 136 140  K 4.2 3.8 3.5 3.6  CL 101 100 103 106  CO2 27 27 27 27   GLUCOSE 141* 290* 144* 230*  BUN 29* 27* 22 19  CREATININE 1.46* 1.44* 1.30* 0.97  CALCIUM 9.5 9.1 8.6* 8.6*   GFR: CrCl cannot be calculated (Unknown ideal weight.). Liver Function Tests: Recent Labs  Lab 06/07/21 1840 06/11/21 0345  AST 30 30  ALT 21 21  ALKPHOS 67 52  BILITOT 0.5 0.4  PROT 7.4 6.4*  ALBUMIN 4.1 3.1*   No results for input(s): LIPASE, AMYLASE in the last 168 hours. No results for input(s): AMMONIA in the last 168 hours. Coagulation Profile: No results for input(s): INR, PROTIME in the last 168 hours. Cardiac Enzymes: No results for input(s): CKTOTAL, CKMB, CKMBINDEX, TROPONINI in the last 168 hours. BNP (last 3 results) No results for input(s): PROBNP in the last 8760 hours. HbA1C: No results for input(s): HGBA1C in the last 72 hours. CBG: Recent Labs  Lab 06/07/21 1612 06/07/21 2009  GLUCAP 181* 115*   Lipid Profile: No results for input(s): CHOL, HDL, LDLCALC, TRIG, CHOLHDL, LDLDIRECT in the last 72 hours. Thyroid Function Tests: No results for input(s): TSH, T4TOTAL, FREET4, T3FREE, THYROIDAB in the last 72 hours. Anemia Panel: No results for input(s): VITAMINB12, FOLATE, FERRITIN, TIBC, IRON,  RETICCTPCT in the last 72 hours.  Urine analysis:    Component Value Date/Time   COLORURINE YELLOW 06/07/2021 2202   APPEARANCEUR CLEAR 06/07/2021 2202   LABSPEC 1.010 06/07/2021 2202   PHURINE 7.0 06/07/2021 2202   GLUCOSEU NEGATIVE 06/07/2021 2202   HGBUR MODERATE (A) 06/07/2021 2202   BILIRUBINUR NEGATIVE 06/07/2021 2202   KETONESUR NEGATIVE 06/07/2021 2202   PROTEINUR 30 (A) 06/07/2021 2202   NITRITE NEGATIVE 06/07/2021 2202   LEUKOCYTESUR LARGE (A) 06/07/2021 2202   Recent Results (from the past 240 hour(s))  Urine culture     Status: Abnormal   Collection Time: 06/07/21 10:02 PM   Specimen: Urine, Random  Result Value Ref Range Status   Specimen Description   Final    URINE, RANDOM Performed at [facility], [place] [address] [place], [place] [address]    Special Requests   Final    NONE Performed at [facility], [place] [address], [place], [place] [address]  Culture (A)  Final    <10,000 COLONIES/mL INSIGNIFICANT GROWTH Performed at [place] [address], [place], [place] [address]    Report Status 06/09/2021 FINAL  Final  Resp Panel by RT-PCR (Flu A&B, Covid) Nasopharyngeal Swab     Status: None   Collection Time: 06/07/21 11:19 PM   Specimen: Nasopharyngeal Swab; Nasopharyngeal(NP) swabs in vial transport medium  Result Value Ref Range Status   SARS Coronavirus 2 by RT PCR NEGATIVE NEGATIVE Final    Comment: (NOTE) SARS-CoV-2 target nucleic acids are NOT DETECTED.  The SARS-CoV-2 RNA is generally detectable in upper respiratory specimens during the acute phase of infection. The lowest concentration of SARS-CoV-2 viral copies this assay can detect is 138 copies/mL. A negative result does not preclude SARS-Cov-2 infection and should not be used as the sole basis for treatment or other patient management decisions. A negative result may occur with  improper specimen collection/handling, submission of specimen  other than nasopharyngeal swab, presence of viral mutation(s) within the areas targeted by this assay, and inadequate number of viral copies(<138 copies/mL). A negative result must be combined with clinical observations, patient history, and epidemiological information. The expected result is Negative.  Fact Sheet for Patients:  EntrepreneurPulse.com.au  Fact Sheet for Healthcare Providers:  IncredibleEmployment.be  This test is no t yet approved or cleared by the [place] FDA and  has been authorized for detection and/or diagnosis of SARS-CoV-2 by FDA under an Emergency Use Authorization (EUA). This EUA will remain  in effect (meaning this test can be used) for the duration of the COVID-19 declaration under Section 564(b)(1) of the Act, 21 U.S.C.section 360bbb-3(b)(1), unless the authorization is terminated  or revoked sooner.       Influenza A by PCR NEGATIVE NEGATIVE Final   Influenza B by PCR NEGATIVE NEGATIVE Final    Comment: (NOTE) The Xpert Xpress SARS-CoV-2/FLU/RSV plus assay is intended as an aid in the diagnosis of influenza from Nasopharyngeal swab specimens and should not be used as a sole basis for treatment. Nasal washings and aspirates are unacceptable for Xpert Xpress SARS-CoV-2/FLU/RSV testing.  Fact Sheet for Patients: EntrepreneurPulse.com.au  Fact Sheet for Healthcare Providers: IncredibleEmployment.be  This test is not yet approved or cleared by the [place] FDA and has been authorized for detection and/or diagnosis of SARS-CoV-2 by FDA under an Emergency Use Authorization (EUA). This EUA will remain in effect (meaning this test can be used) for the duration of the COVID-19 declaration under Section 564(b)(1) of the Act, 21 U.S.C. section 360bbb-3(b)(1), unless the authorization is terminated or revoked.  Performed at [facility], [place] [address] [place], [place] [address]   SARS CORONAVIRUS 2 (TAT 6-24 HRS) Nasopharyngeal Nasopharyngeal Swab     Status: Abnormal   Collection Time: 06/10/21 11:48 AM   Specimen: Nasopharyngeal Swab  Result Value Ref Range Status   SARS Coronavirus 2 POSITIVE (A) NEGATIVE Final    Comment: (NOTE) SARS-CoV-2 target nucleic acids are DETECTED.  The SARS-CoV-2 RNA is generally detectable in upper and lower respiratory specimens during the acute phase of infection. Positive results are indicative of the presence of SARS-CoV-2 RNA. Clinical correlation with patient history and other diagnostic information is  necessary to determine patient infection status. Positive results do not rule out bacterial infection or co-infection with other viruses.  The expected result is Negative.  Fact Sheet for Patients: SugarRoll.be  Fact Sheet for Healthcare Providers: https://www.woods-mathews.com/  This test is not yet approved or cleared  by the [place] and  has been authorized for detection and/or diagnosis of SARS-CoV-2 by FDA under an Emergency Use Authorization (EUA). This EUA will remain  in effect (meaning this test can be used) for the duration of the COVID-19 declaration under Section 564(b)(1) of the Act, 21 U. S.C. section 360bbb-3(b)(1), unless the authorization is terminated or revoked sooner.   Performed at [facility], [place] [address], [place], [place] [address]   Resp Panel by RT-PCR (Flu A&B, Covid) Nasopharyngeal Swab     Status: Abnormal   Collection Time: 06/10/21  3:37 PM   Specimen: Nasopharyngeal Swab; Nasopharyngeal(NP) swabs in vial transport medium  Result Value Ref Range Status   SARS Coronavirus 2 by RT PCR POSITIVE (A) NEGATIVE Final    Comment: RESULT CALLED TO, READ BACK BY AND VERIFIED WITH: Coolidge Breeze RN [id number] 06/10/21 A BROWNING (NOTE) SARS-CoV-2 target nucleic acids are DETECTED.  The SARS-CoV-2 RNA is generally  detectable in upper respiratory specimens during the acute phase of infection. Positive results are indicative of the presence of the identified virus, but do not rule out bacterial infection or co-infection with other pathogens not detected by the test. Clinical correlation with patient history and other diagnostic information is necessary to determine patient infection status. The expected result is Negative.  Fact Sheet for Patients: EntrepreneurPulse.com.au  Fact Sheet for Healthcare Providers: IncredibleEmployment.be  This test is not yet approved or cleared by the [place] FDA and  has been authorized for detection and/or diagnosis of SARS-CoV-2 by FDA under an Emergency Use Authorization (EUA).  This EUA will remain in effect (meaning this test can b e used) for the duration of  the COVID-19 declaration under Section 564(b)(1) of the Act, 21 U.S.C. section 360bbb-3(b)(1), unless the authorization is terminated or revoked sooner.     Influenza A by PCR NEGATIVE NEGATIVE Final   Influenza B by PCR NEGATIVE NEGATIVE Final    Comment: (NOTE) The Xpert Xpress SARS-CoV-2/FLU/RSV plus assay is intended as an aid in the diagnosis of influenza from Nasopharyngeal swab specimens and should not be used as a sole basis for treatment. Nasal washings and aspirates are unacceptable for Xpert Xpress SARS-CoV-2/FLU/RSV testing.  Fact Sheet for Patients: EntrepreneurPulse.com.au  Fact Sheet for Healthcare Providers: IncredibleEmployment.be  This test is not yet approved or cleared by the [place] FDA and has been authorized for detection and/or diagnosis of SARS-CoV-2 by FDA under an Emergency Use Authorization (EUA). This EUA will remain in effect (meaning this test can be used) for the duration of the COVID-19 declaration under Section 564(b)(1) of the Act, 21 U.S.C. section 360bbb-3(b)(1), unless the  authorization is terminated or revoked.  Performed at [facility], [place] [address] [place], [place] [address]       Radiology Studies: No results found.  Scheduled Meds:  amLODipine  10 mg Oral Daily   vitamin C  500 mg Oral Daily   aspirin EC  81 mg Oral Daily   atorvastatin  40 mg Oral q1800   brimonidine  1 drop Left Eye BID   enoxaparin (LOVENOX) injection  40 mg Subcutaneous Q24H   gabapentin  400 mg Oral BID   latanoprost  1 drop Left Eye QHS   metoprolol tartrate  25 mg Oral BID   potassium chloride  10 mEq Oral Daily   [START ON 06/13/2021] Vitamin D (Ergocalciferol)  50,000 Units Oral Weekly   zinc sulfate  220 mg Oral Daily  Continuous Infusions:  remdesivir 100 mg in NS 100 mL 100 mg (06/11/21 1001)     LOS: 2 days   Time spent: 25 minutes.  Patrecia Pour, MD [facility] [email or web address] 06/11/2021, 12:08 PM

## 2021-06-12 DIAGNOSIS — N3000 Acute cystitis without hematuria: Secondary | ICD-10-CM | POA: Diagnosis not present

## 2021-06-12 DIAGNOSIS — I10 Essential (primary) hypertension: Secondary | ICD-10-CM | POA: Diagnosis not present

## 2021-06-12 DIAGNOSIS — I5022 Chronic systolic (congestive) heart failure: Secondary | ICD-10-CM | POA: Diagnosis not present

## 2021-06-12 DIAGNOSIS — Z85118 Personal history of other malignant neoplasm of bronchus and lung: Secondary | ICD-10-CM | POA: Diagnosis not present

## 2021-06-12 MED ORDER — METOPROLOL TARTRATE 25 MG PO TABS
12.5000 mg | ORAL_TABLET | Freq: Two times a day (BID) | ORAL | Status: DC
  Administered 2021-06-12 – 2021-06-20 (×15): 12.5 mg via ORAL
  Filled 2021-06-12 (×16): qty 1

## 2021-06-12 MED ORDER — FUROSEMIDE 40 MG PO TABS
40.0000 mg | ORAL_TABLET | Freq: Every day | ORAL | Status: DC
  Administered 2021-06-12 – 2021-06-20 (×9): 40 mg via ORAL
  Filled 2021-06-12 (×9): qty 1

## 2021-06-12 MED ORDER — LISINOPRIL 20 MG PO TABS
20.0000 mg | ORAL_TABLET | Freq: Every day | ORAL | Status: DC
  Administered 2021-06-12 – 2021-06-20 (×9): 20 mg via ORAL
  Filled 2021-06-12 (×9): qty 1

## 2021-06-12 MED ORDER — HYDRALAZINE HCL 25 MG PO TABS: 25.0000 mg | ORAL_TABLET | Freq: Three times a day (TID) | ORAL | Status: DC

## 2021-06-12 NOTE — Progress Notes (Signed)
PROGRESS NOTE  Erin Carr  QQV:[id number] DOB: [birth year] DOA: 06/07/2021 PCP: Vincente Liberty, MD  Brief Narrative: Erin Carr is an 87 y.o. female with a history of CVA, right foot drop, NSTEMI, CHF, NSCLC in remission s/p radiotherapy, HTN, T2DM, asthma, and HLD who presented to the ED 7/5 with bilateral leg weakness, referred from urgent care. Also had new urinary incontinence. CT head showed no acute stroke, creatinine elevated at 1.46, and urinalysis with significant pyuria. Ceftriaxone started with IV fluids and pending urine culture. PT consult requested, recommending subacute rehabilitation at SNF which is being pursued. The patient has developed fever despite antibiotics and subsequent congestion, tested positive for covid-19 with minimally elevated CRP (1.4) and no evidence of pneumonia. Renal function would not allow for paxlovid, so remdesivir was given for 3 doses. She is medically stable for discharge, though this is complicated by the need for SNF rehabilitation and covid-positive status.   Assessment & Plan: Principal Problem:   UTI (urinary tract infection) Active Problems:   Type 2 diabetes mellitus without complication (HCC)   Hypertensive disorder   Weakness of both legs   Chronic systolic CHF (congestive heart failure) (HCC)   History of lung cancer   AKI (acute kidney injury) ([place])  Lower extremity weakness: Improving, suspect infection-related worsening of general functional decline. No back pain or trauma or ongoing incontinence. Neuro exam with attention to legs is reassuring, and overall improved with fluids and abx.  - Continue monitoring exam. No further neuroimaging currently planned - Continue PT as much as possible here and possibly at SNF.  UTI:  - Completed ceftriaxone with insignificant growth on culture. Fever explained more by covid-19. Will stop abx.   Covid-19 infection: No evidence of pneumonia. CRP only 1.4. Vaccinated x2. Still at high  risk for poor outcome due to age and comorbidities. Renal function too poor for paxlovid. Given hx vaccination, suspect impact of very early (within 6 hours of diagnosis) antiviral would be more beneficial than monoclonal antibody in this setting. D/w family and pt.  - Remdesivir x3 days (7/8 - 7/10). Monitor clinically. Isolate for 10 days per protocol. Will have to pursue covid-accepting facilites vs. intensive PT here and return home at DC.  - Supportive care for congestion.  - Monitor cytopenias (onset coincides with covid positivity)  Stage IIIb CKD: Current CrCl slightly below 32ml/min but baseline likely 30-16ml/min.  - Avoid nephrotoxins.   Chronic HFpEF, HTN: Appears euvolemic. - Continue metoprolol, norvasc. Restart ACEi (lisinopril 20mg  daily instead of home BID for now) since renal function improved and BP elevated. Will also restart home lasix at reduced dosing.  - Hold metoprolol for HR <60bpm   History of CVA:  - Statin, ASA  T2DM:  - SSI  HLD:  - Statin  Hx NSCLC: Routine onc f/u.   Thrombocytopenia: No bleeding. Likely covid effect.  - Monitor intermittently. Still B > R for anticoagulation.  DVT prophylaxis: Lovenox Code Status: Full Family Communication: Niece by phone again 7/9. Disposition Plan:  Status is: Inpatient due to Unsafe d/c plan  Dispo: The patient is from: Home              Anticipated d/c is to: SNF              Patient currently is medically stable to d/c. though placement complicated by covid positivity.      Difficult to place patient No  Consultants:  None  Procedures:  None  Antimicrobials: Ceftriaxone  Remdesivir  7/8 - 7/10.  Subjective: No new complaints. Congestion resolved, cough minimal. No chest pain or dyspnea. Hasn't been getting out of bed, but amenable to that.   Objective: Vitals:   06/11/21 0958 06/11/21 1103 06/11/21 2056 06/12/21 0405  BP: (!) 165/61 (!) 167/59 (!) 156/60 (!) 161/62  Pulse: 70 63 65 (!) 53   Resp:  16 15 17   Temp:  98.6 F (37 C) 98.1 F (36.7 C) 98.4 F (36.9 C)  TempSrc:    Oral  SpO2:  100% 100% 100%    Intake/Output Summary (Last 24 hours) at 06/12/2021 1093 Last data filed at 06/12/2021 0400 Gross per 24 hour  Intake 700 ml  Output 1100 ml  Net -400 ml   Gen: 87 y.o. female in no distress Pulm: Nonlabored breathing room air. Clear. CV: Regular rate and rhythm. No murmur, rub, or gallop. No JVD, no dependent edema. GI: Abdomen soft, non-tender, non-distended, with normoactive bowel sounds.  Ext: Warm, no deformities Skin: No rashes, lesions or ulcers on visualized skin. Neuro: Alert and oriented. No focal neurological deficits. Psych: Judgement and insight appear fair. Mood euthymic & affect congruent. Behavior is appropriate.    Data Reviewed: I have personally reviewed following labs and imaging studies  CBC: Recent Labs  Lab 06/07/21 1840 06/08/21 0413 06/10/21 0505 06/11/21 0345  WBC 6.9 5.0 3.1* 3.5*  NEUTROABS 4.2  --  1.4* 1.8  HGB 10.9* 10.3* 10.0* 11.0*  HCT 35.8* 33.6* 32.3* 35.2*  MCV 88.2 87.5 87.3 86.3  PLT 168 158 135* 235*   Basic Metabolic Panel: Recent Labs  Lab 06/07/21 1840 06/08/21 0413 06/09/21 0456 06/11/21 0345  NA 138 138 136 140  K 4.2 3.8 3.5 3.6  CL 101 100 103 106  CO2 27 27 27 27   GLUCOSE 141* 290* 144* 230*  BUN 29* 27* 22 19  CREATININE 1.46* 1.44* 1.30* 0.97  CALCIUM 9.5 9.1 8.6* 8.6*   GFR: CrCl cannot be calculated (Unknown ideal weight.). Liver Function Tests: Recent Labs  Lab 06/07/21 1840 06/11/21 0345  AST 30 30  ALT 21 21  ALKPHOS 67 52  BILITOT 0.5 0.4  PROT 7.4 6.4*  ALBUMIN 4.1 3.1*   No results for input(s): LIPASE, AMYLASE in the last 168 hours. No results for input(s): AMMONIA in the last 168 hours. Coagulation Profile: No results for input(s): INR, PROTIME in the last 168 hours. Cardiac Enzymes: No results for input(s): CKTOTAL, CKMB, CKMBINDEX, TROPONINI in the last 168  hours. BNP (last 3 results) No results for input(s): PROBNP in the last 8760 hours. HbA1C: No results for input(s): HGBA1C in the last 72 hours. CBG: Recent Labs  Lab 06/07/21 1612 06/07/21 2009  GLUCAP 181* 115*   Lipid Profile: No results for input(s): CHOL, HDL, LDLCALC, TRIG, CHOLHDL, LDLDIRECT in the last 72 hours. Thyroid Function Tests: No results for input(s): TSH, T4TOTAL, FREET4, T3FREE, THYROIDAB in the last 72 hours. Anemia Panel: No results for input(s): VITAMINB12, FOLATE, FERRITIN, TIBC, IRON, RETICCTPCT in the last 72 hours.  Urine analysis:    Component Value Date/Time   COLORURINE YELLOW 06/07/2021 2202   APPEARANCEUR CLEAR 06/07/2021 2202   LABSPEC 1.010 06/07/2021 2202   PHURINE 7.0 06/07/2021 2202   GLUCOSEU NEGATIVE 06/07/2021 2202   HGBUR MODERATE (A) 06/07/2021 2202   BILIRUBINUR NEGATIVE 06/07/2021 2202   KETONESUR NEGATIVE 06/07/2021 2202   PROTEINUR 30 (A) 06/07/2021 2202   NITRITE NEGATIVE 06/07/2021 2202   LEUKOCYTESUR LARGE (A) 06/07/2021 2202   Recent  Results (from the past 240 hour(s))  Urine culture     Status: Abnormal   Collection Time: 06/07/21 10:02 PM   Specimen: Urine, Random  Result Value Ref Range Status   Specimen Description   Final    URINE, RANDOM Performed at [facility] [address] [place], [place] [address]    Special Requests   Final    NONE Performed at [facility], [place] [address] [place], [place] [address]    Culture (A)  Final    <10,000 COLONIES/mL INSIGNIFICANT GROWTH Performed at [facility] [address] [place], [place] [address]    Report Status 06/09/2021 FINAL  Final  Resp Panel by RT-PCR (Flu A&B, Covid) Nasopharyngeal Swab     Status: None   Collection Time: 06/07/21 11:19 PM   Specimen: Nasopharyngeal Swab; Nasopharyngeal(NP) swabs in vial transport medium  Result Value Ref Range Status   SARS Coronavirus 2 by RT PCR NEGATIVE NEGATIVE Final     Comment: (NOTE) SARS-CoV-2 target nucleic acids are NOT DETECTED.  The SARS-CoV-2 RNA is generally detectable in upper respiratory specimens during the acute phase of infection. The lowest concentration of SARS-CoV-2 viral copies this assay can detect is 138 copies/mL. A negative result does not preclude SARS-Cov-2 infection and should not be used as the sole basis for treatment or other patient management decisions. A negative result may occur with  improper specimen collection/handling, submission of specimen other than nasopharyngeal swab, presence of viral mutation(s) within the areas targeted by this assay, and inadequate number of viral copies(<138 copies/mL). A negative result must be combined with clinical observations, patient history, and epidemiological information. The expected result is Negative.  Fact Sheet for Patients:  EntrepreneurPulse.com.au  Fact Sheet for Healthcare Providers:  IncredibleEmployment.be  This test is no t yet approved or cleared by the [place] FDA and  has been authorized for detection and/or diagnosis of SARS-CoV-2 by FDA under an Emergency Use Authorization (EUA). This EUA will remain  in effect (meaning this test can be used) for the duration of the COVID-19 declaration under Section 564(b)(1) of the Act, 21 U.S.C.section 360bbb-3(b)(1), unless the authorization is terminated  or revoked sooner.       Influenza A by PCR NEGATIVE NEGATIVE Final   Influenza B by PCR NEGATIVE NEGATIVE Final    Comment: (NOTE) The Xpert Xpress SARS-CoV-2/FLU/RSV plus assay is intended as an aid in the diagnosis of influenza from Nasopharyngeal swab specimens and should not be used as a sole basis for treatment. Nasal washings and aspirates are unacceptable for Xpert Xpress SARS-CoV-2/FLU/RSV testing.  Fact Sheet for Patients: EntrepreneurPulse.com.au  Fact Sheet for Healthcare  Providers: IncredibleEmployment.be  This test is not yet approved or cleared by the [place] FDA and has been authorized for detection and/or diagnosis of SARS-CoV-2 by FDA under an Emergency Use Authorization (EUA). This EUA will remain in effect (meaning this test can be used) for the duration of the COVID-19 declaration under Section 564(b)(1) of the Act, 21 U.S.C. section 360bbb-3(b)(1), unless the authorization is terminated or revoked.  Performed at [facility], [place] [address] [place], [place] [address]   SARS CORONAVIRUS 2 (TAT 6-24 HRS) Nasopharyngeal Nasopharyngeal Swab     Status: Abnormal   Collection Time: 06/10/21 11:48 AM   Specimen: Nasopharyngeal Swab  Result Value Ref Range Status   SARS Coronavirus 2 POSITIVE (A) NEGATIVE Final    Comment: (NOTE) SARS-CoV-2 target nucleic acids are DETECTED.  The SARS-CoV-2 RNA is generally detectable in upper and lower respiratory specimens during the acute phase of infection. Positive results are indicative of the presence of SARS-CoV-2 RNA. Clinical correlation with patient history and other diagnostic information is  necessary to determine patient infection status. Positive results do not rule out bacterial infection or co-infection with other viruses.  The expected result is Negative.  Fact Sheet for Patients: SugarRoll.be  Fact Sheet for Healthcare Providers: https://www.woods-mathews.com/  This test is not yet approved or cleared by the [place] FDA and  has been authorized for detection and/or diagnosis of SARS-CoV-2 by FDA under an Emergency Use Authorization (EUA). This EUA will remain  in effect (meaning this test can be used) for the duration of the COVID-19 declaration under Section 564(b)(1) of the Act, 21 U. S.C. section 360bbb-3(b)(1), unless the authorization is terminated or revoked sooner.   Performed at [facility], [place] [address], [place], [place] [address]   Resp Panel by RT-PCR (Flu A&B, Covid) Nasopharyngeal Swab     Status: Abnormal   Collection Time: 06/10/21  3:37 PM   Specimen: Nasopharyngeal Swab; Nasopharyngeal(NP) swabs in vial transport medium  Result Value Ref Range Status   SARS Coronavirus 2 by RT PCR POSITIVE (A) NEGATIVE Final    Comment: RESULT CALLED TO, READ BACK BY AND VERIFIED WITH: Coolidge Breeze RN [id number] 06/10/21 A BROWNING (NOTE) SARS-CoV-2 target nucleic acids are DETECTED.  The SARS-CoV-2 RNA is generally detectable in upper respiratory specimens during the acute phase of infection. Positive results are indicative of the presence of the identified virus, but do not rule out bacterial infection or co-infection with other pathogens not detected by the test. Clinical correlation with patient history and other diagnostic information is necessary to determine patient infection status. The expected result is Negative.  Fact Sheet for Patients: EntrepreneurPulse.com.au  Fact Sheet for Healthcare Providers: IncredibleEmployment.be  This test is not yet approved or cleared by the [place] FDA and  has been authorized for detection and/or diagnosis of SARS-CoV-2 by FDA under an Emergency Use Authorization (EUA).  This EUA will remain in effect (meaning this test can b e used) for the duration of  the COVID-19 declaration under Section 564(b)(1) of the Act, 21 U.S.C. section 360bbb-3(b)(1), unless the authorization is terminated or revoked sooner.     Influenza A by PCR NEGATIVE NEGATIVE Final   Influenza B by PCR NEGATIVE NEGATIVE Final    Comment: (NOTE) The Xpert Xpress SARS-CoV-2/FLU/RSV plus assay is intended as an aid in the diagnosis of influenza from Nasopharyngeal swab specimens and should not be used as a sole basis for treatment. Nasal washings and aspirates are unacceptable for Xpert Xpress  SARS-CoV-2/FLU/RSV testing.  Fact Sheet for Patients: EntrepreneurPulse.com.au  Fact Sheet for Healthcare Providers: IncredibleEmployment.be  This test is not yet approved or cleared by the [place] FDA and has been authorized for detection and/or diagnosis of SARS-CoV-2 by FDA under an Emergency Use Authorization (EUA). This EUA will remain in effect (meaning this test can be used) for the duration of the COVID-19 declaration under Section 564(b)(1) of the Act, 21 U.S.C. section 360bbb-3(b)(1), unless the authorization is terminated or revoked.  Performed at [facility], [place] [address] [place], [place] [address]       Radiology Studies: No results found.  Scheduled Meds:  amLODipine  10 mg Oral Daily   vitamin C  500 mg Oral Daily   aspirin EC  81 mg Oral Daily  atorvastatin  40 mg Oral q1800   brimonidine  1 drop Left Eye BID   enoxaparin (LOVENOX) injection  40 mg Subcutaneous Q24H   gabapentin  400 mg Oral BID   hydrALAZINE  25 mg Oral Q8H   latanoprost  1 drop Left Eye QHS   metoprolol tartrate  12.5 mg Oral BID   potassium chloride  10 mEq Oral Daily   [START ON 06/13/2021] Vitamin D (Ergocalciferol)  50,000 Units Oral Weekly   zinc sulfate  220 mg Oral Daily   Continuous Infusions:     LOS: 3 days   Time spent: 25 minutes.  Patrecia Pour, MD [facility] [email or web address] 06/12/2021, 9:27 AM

## 2021-06-13 DIAGNOSIS — Z85118 Personal history of other malignant neoplasm of bronchus and lung: Secondary | ICD-10-CM | POA: Diagnosis not present

## 2021-06-13 DIAGNOSIS — I5022 Chronic systolic (congestive) heart failure: Secondary | ICD-10-CM | POA: Diagnosis not present

## 2021-06-13 DIAGNOSIS — N3000 Acute cystitis without hematuria: Secondary | ICD-10-CM | POA: Diagnosis not present

## 2021-06-13 DIAGNOSIS — I10 Essential (primary) hypertension: Secondary | ICD-10-CM | POA: Diagnosis not present

## 2021-06-13 MED ORDER — IPRATROPIUM BROMIDE 0.03 % NA SOLN
2.0000 | Freq: Two times a day (BID) | NASAL | Status: DC
  Administered 2021-06-13 – 2021-06-20 (×15): 2 via NASAL
  Filled 2021-06-13: qty 30

## 2021-06-13 NOTE — Care Management Important Message (Signed)
Important Message  Patient Details IM Letter given to the Patient. Name: Erin Carr MRN: [id number] Date of Birth: 1930/05/20   Medicare Important Message Given:  Yes     [name] 06/13/2021, 9:19 AM

## 2021-06-13 NOTE — Progress Notes (Signed)
PROGRESS NOTE  Erin Carr  ION:[id number] DOB: [birth year] DOA: 06/07/2021 PCP: Vincente Liberty, MD  Brief Narrative: Erin Carr is an 87 y.o. female with a history of CVA, right foot drop, NSTEMI, CHF, NSCLC in remission s/p radiotherapy, HTN, T2DM, asthma, and HLD who presented to the ED 7/5 with bilateral leg weakness, referred from urgent care. Also had new urinary incontinence. CT head showed no acute stroke, creatinine elevated at 1.46, and urinalysis with significant pyuria. Ceftriaxone started with IV fluids and pending urine culture. PT consult requested, recommending subacute rehabilitation at SNF which is being pursued. The patient has developed fever despite antibiotics and subsequent congestion, tested positive for covid-19 with minimally elevated CRP (1.4) and no evidence of pneumonia. Renal function would not allow for paxlovid, so remdesivir was given for 3 doses. She is medically stable for discharge, though this is complicated by the need for SNF rehabilitation and covid-positive status.   Assessment & Plan: Principal Problem:   UTI (urinary tract infection) Active Problems:   Type 2 diabetes mellitus without complication (HCC)   Hypertensive disorder   Weakness of both legs   Chronic systolic CHF (congestive heart failure) (HCC)   History of lung cancer   AKI (acute kidney injury) ([place])  Lower extremity weakness: Improving, suspect infection-related worsening of general functional decline. No back pain or trauma or ongoing incontinence. Neuro exam with attention to legs is reassuring, and overall improved with fluids and abx.  - Continue monitoring exam. No further neuroimaging currently planned - Continue PT as much as possible here and possibly at SNF.  UTI:  - Completed ceftriaxone with insignificant growth on culture. Fever explained more by covid-19. Will stop abx.   Covid-19 infection: No evidence of pneumonia. CRP only 1.4. Vaccinated x2. Still at high  risk for poor outcome due to age and comorbidities. Renal function too poor for paxlovid. Given hx vaccination, suspect impact of very early (within 6 hours of diagnosis) antiviral would be more beneficial than monoclonal antibody in this setting. D/w family and pt.  - Remdesivir x3 days (7/8 - 7/10). Monitor clinically. Isolate for 10 days per protocol. Will have to pursue covid-accepting facilites vs. intensive PT here and return home at DC. D/w RN to request PT reevaluation. Pt appears clinically much improved from the time of last evaluation.  - Supportive care for congestion.  - Monitor cytopenias (onset coincides with covid positivity) - IN atrovent prn  Stage IIIb CKD: Current CrCl slightly below 30ml/min but baseline likely 30-48ml/min.  - Avoid nephrotoxins.   Chronic HFpEF, HTN: Appears euvolemic. - Continue metoprolol, norvasc. Restart ACEi (lisinopril 20mg  daily instead of home BID for now) since renal function improved and BP elevated. Will also restart home lasix at reduced dosing.  - Hold metoprolol for HR <60bpm   History of CVA:  - Statin, ASA  T2DM:  - SSI  HLD:  - Statin  Hx NSCLC: Routine onc f/u.   Thrombocytopenia: No bleeding. Likely covid effect.  - Monitor again in AM. Still B > R for anticoagulation.  DVT prophylaxis: Lovenox Code Status: Full Family Communication: Niece by phone last 7/9. Disposition Plan:  Status is: Inpatient due to Unsafe d/c plan  Dispo: The patient is from: Home              Anticipated d/c is to: SNF              Patient currently is medically stable to d/c. though placement complicated by covid positivity.  Difficult to place patient No  Consultants:  None  Procedures:  None  Antimicrobials: Ceftriaxone  Remdesivir 7/8 - 7/10.  Subjective: Still with some congestion, no rhinorrhea, sore throat, dyspnea. Mild cough is stable. No chest pain or fever.   Objective: Vitals:   06/12/21 1302 06/12/21 2114 06/13/21  0418 06/13/21 0556  BP: 138/61 132/65 (!) 147/67 (!) 150/62  Pulse: 61 64 (!) 56 (!) 56  Resp: 16 16 14 18   Temp: (!) 97.5 F (36.4 C) 97.9 F (36.6 C) 98.1 F (36.7 C) 98 F (36.7 C)  TempSrc:      SpO2: 100% 100% 100% 100%    Intake/Output Summary (Last 24 hours) at 06/13/2021 0953 Last data filed at 06/13/2021 0900 Gross per 24 hour  Intake 580 ml  Output 1050 ml  Net -470 ml   Gen: 87 y.o. female in no distress Pulm: Nonlabored breathing room air. Clear. CV: Regular rate and rhythm. No murmur, rub, or gallop. No JVD, no dependent edema. GI: Abdomen soft, non-tender, non-distended, with normoactive bowel sounds.  Ext: Warm, no deformities Skin: No rashes, lesions or ulcers on visualized skin. Neuro: Alert and oriented. Right foot drop. No new focal neurological deficits. Psych: Judgement and insight appear fair. Mood euthymic & affect congruent. Behavior is appropriate.    Data Reviewed: I have personally reviewed following labs and imaging studies  CBC: Recent Labs  Lab 06/07/21 1840 06/08/21 0413 06/10/21 0505 06/11/21 0345  WBC 6.9 5.0 3.1* 3.5*  NEUTROABS 4.2  --  1.4* 1.8  HGB 10.9* 10.3* 10.0* 11.0*  HCT 35.8* 33.6* 32.3* 35.2*  MCV 88.2 87.5 87.3 86.3  PLT 168 158 135* 496*   Basic Metabolic Panel: Recent Labs  Lab 06/07/21 1840 06/08/21 0413 06/09/21 0456 06/11/21 0345  NA 138 138 136 140  K 4.2 3.8 3.5 3.6  CL 101 100 103 106  CO2 27 27 27 27   GLUCOSE 141* 290* 144* 230*  BUN 29* 27* 22 19  CREATININE 1.46* 1.44* 1.30* 0.97  CALCIUM 9.5 9.1 8.6* 8.6*   GFR: CrCl cannot be calculated (Unknown ideal weight.). Liver Function Tests: Recent Labs  Lab 06/07/21 1840 06/11/21 0345  AST 30 30  ALT 21 21  ALKPHOS 67 52  BILITOT 0.5 0.4  PROT 7.4 6.4*  ALBUMIN 4.1 3.1*   No results for input(s): LIPASE, AMYLASE in the last 168 hours. No results for input(s): AMMONIA in the last 168 hours. Coagulation Profile: No results for input(s): INR,  PROTIME in the last 168 hours. Cardiac Enzymes: No results for input(s): CKTOTAL, CKMB, CKMBINDEX, TROPONINI in the last 168 hours. BNP (last 3 results) No results for input(s): PROBNP in the last 8760 hours. HbA1C: No results for input(s): HGBA1C in the last 72 hours. CBG: Recent Labs  Lab 06/07/21 1612 06/07/21 2009  GLUCAP 181* 115*   Lipid Profile: No results for input(s): CHOL, HDL, LDLCALC, TRIG, CHOLHDL, LDLDIRECT in the last 72 hours. Thyroid Function Tests: No results for input(s): TSH, T4TOTAL, FREET4, T3FREE, THYROIDAB in the last 72 hours. Anemia Panel: No results for input(s): VITAMINB12, FOLATE, FERRITIN, TIBC, IRON, RETICCTPCT in the last 72 hours.  Urine analysis:    Component Value Date/Time   COLORURINE YELLOW 06/07/2021 2202   APPEARANCEUR CLEAR 06/07/2021 2202   LABSPEC 1.010 06/07/2021 2202   PHURINE 7.0 06/07/2021 2202   GLUCOSEU NEGATIVE 06/07/2021 2202   HGBUR MODERATE (A) 06/07/2021 2202   BILIRUBINUR NEGATIVE 06/07/2021 2202   KETONESUR NEGATIVE 06/07/2021 2202  PROTEINUR 30 (A) 06/07/2021 2202   NITRITE NEGATIVE 06/07/2021 2202   LEUKOCYTESUR LARGE (A) 06/07/2021 2202   Recent Results (from the past 240 hour(s))  Urine culture     Status: Abnormal   Collection Time: 06/07/21 10:02 PM   Specimen: Urine, Random  Result Value Ref Range Status   Specimen Description   Final    URINE, RANDOM Performed at [facility], [place] [address] [place], [place] [address]    Special Requests   Final    NONE Performed at [facility], [place] [address] [place], [place] [address]    Culture (A)  Final    <10,000 COLONIES/mL INSIGNIFICANT GROWTH Performed at [facility] [address], [place], [place] [address]    Report Status 06/09/2021 FINAL  Final  Resp Panel by RT-PCR (Flu A&B, Covid) Nasopharyngeal Swab     Status: None   Collection Time: 06/07/21 11:19 PM   Specimen: Nasopharyngeal Swab;  Nasopharyngeal(NP) swabs in vial transport medium  Result Value Ref Range Status   SARS Coronavirus 2 by RT PCR NEGATIVE NEGATIVE Final    Comment: (NOTE) SARS-CoV-2 target nucleic acids are NOT DETECTED.  The SARS-CoV-2 RNA is generally detectable in upper respiratory specimens during the acute phase of infection. The lowest concentration of SARS-CoV-2 viral copies this assay can detect is 138 copies/mL. A negative result does not preclude SARS-Cov-2 infection and should not be used as the sole basis for treatment or other patient management decisions. A negative result may occur with  improper specimen collection/handling, submission of specimen other than nasopharyngeal swab, presence of viral mutation(s) within the areas targeted by this assay, and inadequate number of viral copies(<138 copies/mL). A negative result must be combined with clinical observations, patient history, and epidemiological information. The expected result is Negative.  Fact Sheet for Patients:  EntrepreneurPulse.com.au  Fact Sheet for Healthcare Providers:  IncredibleEmployment.be  This test is no t yet approved or cleared by the [place] FDA and  has been authorized for detection and/or diagnosis of SARS-CoV-2 by FDA under an Emergency Use Authorization (EUA). This EUA will remain  in effect (meaning this test can be used) for the duration of the COVID-19 declaration under Section 564(b)(1) of the Act, 21 U.S.C.section 360bbb-3(b)(1), unless the authorization is terminated  or revoked sooner.       Influenza A by PCR NEGATIVE NEGATIVE Final   Influenza B by PCR NEGATIVE NEGATIVE Final    Comment: (NOTE) The Xpert Xpress SARS-CoV-2/FLU/RSV plus assay is intended as an aid in the diagnosis of influenza from Nasopharyngeal swab specimens and should not be used as a sole basis for treatment. Nasal washings and aspirates are unacceptable for Xpert Xpress  SARS-CoV-2/FLU/RSV testing.  Fact Sheet for Patients: EntrepreneurPulse.com.au  Fact Sheet for Healthcare Providers: IncredibleEmployment.be  This test is not yet approved or cleared by the [place] FDA and has been authorized for detection and/or diagnosis of SARS-CoV-2 by FDA under an Emergency Use Authorization (EUA). This EUA will remain in effect (meaning this test can be used) for the duration of the COVID-19 declaration under Section 564(b)(1) of the Act, 21 U.S.C. section 360bbb-3(b)(1), unless the authorization is terminated or revoked.  Performed at [facility], [place] [address], [place], [place] [address]   SARS CORONAVIRUS 2 (TAT 6-24 HRS) Nasopharyngeal Nasopharyngeal Swab     Status: Abnormal   Collection Time: 06/10/21 11:48 AM   Specimen: Nasopharyngeal Swab  Result Value Ref Range Status  SARS Coronavirus 2 POSITIVE (A) NEGATIVE Final    Comment: (NOTE) SARS-CoV-2 target nucleic acids are DETECTED.  The SARS-CoV-2 RNA is generally detectable in upper and lower respiratory specimens during the acute phase of infection. Positive results are indicative of the presence of SARS-CoV-2 RNA. Clinical correlation with patient history and other diagnostic information is  necessary to determine patient infection status. Positive results do not rule out bacterial infection or co-infection with other viruses.  The expected result is Negative.  Fact Sheet for Patients: SugarRoll.be  Fact Sheet for Healthcare Providers: https://www.woods-mathews.com/  This test is not yet approved or cleared by the [place] FDA and  has been authorized for detection and/or diagnosis of SARS-CoV-2 by FDA under an Emergency Use Authorization (EUA). This EUA will remain  in effect (meaning this test can be used) for the duration of the COVID-19 declaration under Section 564(b)(1) of the  Act, 21 U. S.C. section 360bbb-3(b)(1), unless the authorization is terminated or revoked sooner.   Performed at [facility], [place] [address] [place], [place] [address]   Resp Panel by RT-PCR (Flu A&B, Covid) Nasopharyngeal Swab     Status: Abnormal   Collection Time: 06/10/21  3:37 PM   Specimen: Nasopharyngeal Swab; Nasopharyngeal(NP) swabs in vial transport medium  Result Value Ref Range Status   SARS Coronavirus 2 by RT PCR POSITIVE (A) NEGATIVE Final    Comment: RESULT CALLED TO, READ BACK BY AND VERIFIED WITH: Coolidge Breeze RN [id number] 06/10/21 A BROWNING (NOTE) SARS-CoV-2 target nucleic acids are DETECTED.  The SARS-CoV-2 RNA is generally detectable in upper respiratory specimens during the acute phase of infection. Positive results are indicative of the presence of the identified virus, but do not rule out bacterial infection or co-infection with other pathogens not detected by the test. Clinical correlation with patient history and other diagnostic information is necessary to determine patient infection status. The expected result is Negative.  Fact Sheet for Patients: EntrepreneurPulse.com.au  Fact Sheet for Healthcare Providers: IncredibleEmployment.be  This test is not yet approved or cleared by the [place] FDA and  has been authorized for detection and/or diagnosis of SARS-CoV-2 by FDA under an Emergency Use Authorization (EUA).  This EUA will remain in effect (meaning this test can b e used) for the duration of  the COVID-19 declaration under Section 564(b)(1) of the Act, 21 U.S.C. section 360bbb-3(b)(1), unless the authorization is terminated or revoked sooner.     Influenza A by PCR NEGATIVE NEGATIVE Final   Influenza B by PCR NEGATIVE NEGATIVE Final    Comment: (NOTE) The Xpert Xpress SARS-CoV-2/FLU/RSV plus assay is intended as an aid in the diagnosis of influenza from Nasopharyngeal swab specimens and should not be  used as a sole basis for treatment. Nasal washings and aspirates are unacceptable for Xpert Xpress SARS-CoV-2/FLU/RSV testing.  Fact Sheet for Patients: EntrepreneurPulse.com.au  Fact Sheet for Healthcare Providers: IncredibleEmployment.be  This test is not yet approved or cleared by the [place] FDA and has been authorized for detection and/or diagnosis of SARS-CoV-2 by FDA under an Emergency Use Authorization (EUA). This EUA will remain in effect (meaning this test can be used) for the duration of the COVID-19 declaration under Section 564(b)(1) of the Act, 21 U.S.C. section 360bbb-3(b)(1), unless the authorization is terminated or revoked.  Performed at [facility], [place] [address], [place], [place] [address]       Radiology Studies: No results found.  Scheduled Meds:  amLODipine  10 mg Oral  Daily   vitamin C  500 mg Oral Daily   aspirin EC  81 mg Oral Daily   atorvastatin  40 mg Oral q1800   brimonidine  1 drop Left Eye BID   enoxaparin (LOVENOX) injection  40 mg Subcutaneous Q24H   furosemide  40 mg Oral Daily   gabapentin  400 mg Oral BID   latanoprost  1 drop Left Eye QHS   lisinopril  20 mg Oral Daily   metoprolol tartrate  12.5 mg Oral BID   potassium chloride  10 mEq Oral Daily   Vitamin D (Ergocalciferol)  50,000 Units Oral Weekly   zinc sulfate  220 mg Oral Daily   Continuous Infusions:     LOS: 4 days   Time spent: 25 minutes.  Patrecia Pour, MD [facility] [email or web address] 06/13/2021, 9:53 AM

## 2021-06-14 DIAGNOSIS — N3000 Acute cystitis without hematuria: Secondary | ICD-10-CM | POA: Diagnosis not present

## 2021-06-14 DIAGNOSIS — I10 Essential (primary) hypertension: Secondary | ICD-10-CM | POA: Diagnosis not present

## 2021-06-14 DIAGNOSIS — Z85118 Personal history of other malignant neoplasm of bronchus and lung: Secondary | ICD-10-CM | POA: Diagnosis not present

## 2021-06-14 DIAGNOSIS — I5022 Chronic systolic (congestive) heart failure: Secondary | ICD-10-CM | POA: Diagnosis not present

## 2021-06-14 LAB — CBC
HCT: 34.6 % — ABNORMAL LOW (ref 36.0–46.0)
Hemoglobin: 10.7 g/dL — ABNORMAL LOW (ref 12.0–15.0)
MCH: 26.4 pg (ref 26.0–34.0)
MCHC: 30.9 g/dL (ref 30.0–36.0)
MCV: 85.2 fL (ref 80.0–100.0)
Platelets: 202 10*3/uL (ref 150–400)
RBC: 4.06 MIL/uL (ref 3.87–5.11)
RDW: 13.3 % (ref 11.5–15.5)
WBC: 4.4 10*3/uL (ref 4.0–10.5)
nRBC: 0 % (ref 0.0–0.2)

## 2021-06-14 LAB — BASIC METABOLIC PANEL
Anion gap: 7 (ref 5–15)
BUN: 29 mg/dL — ABNORMAL HIGH (ref 8–23)
CO2: 23 mmol/L (ref 22–32)
Calcium: 8.9 mg/dL (ref 8.9–10.3)
Chloride: 105 mmol/L (ref 98–111)
Creatinine, Ser: 1.01 mg/dL — ABNORMAL HIGH (ref 0.44–1.00)
GFR, Estimated: 54 mL/min — ABNORMAL LOW (ref >60)
Glucose, Bld: 248 mg/dL — ABNORMAL HIGH (ref 70–99)
Potassium: 3.5 mmol/L (ref 3.5–5.1)
Sodium: 135 mmol/L (ref 135–145)

## 2021-06-14 NOTE — Progress Notes (Signed)
PROGRESS NOTE  MARJI KUEHNEL  MVH:[id number] DOB: 02/19/1930 DOA: 06/07/2021 PCP: Vincente Liberty, MD  Brief Narrative: KAYLEEN ALIG is an 87 y.o. female with a history of CVA, right foot drop, NSTEMI, CHF, NSCLC in remission s/p radiotherapy, HTN, T2DM, asthma, and HLD who presented to the ED 7/5 with bilateral leg weakness, referred from urgent care. Also had new urinary incontinence. CT head showed no acute stroke, creatinine elevated at 1.46, and urinalysis with significant pyuria. Ceftriaxone started with IV fluids and pending urine culture. PT consult requested, recommending subacute rehabilitation at SNF which is being pursued. The patient has developed fever despite antibiotics and subsequent congestion, tested positive for covid-19 with minimally elevated CRP (1.4) and no evidence of pneumonia. Renal function would not allow for paxlovid, so remdesivir was given for 3 doses. She is medically stable for discharge, though this is complicated by the need for SNF rehabilitation and covid-positive status.   Assessment & Plan: Principal Problem:   UTI (urinary tract infection) Active Problems:   Type 2 diabetes mellitus without complication (HCC)   Hypertensive disorder   Weakness of both legs   Chronic systolic CHF (congestive heart failure) (HCC)   History of lung cancer   AKI (acute kidney injury) ([place])  Lower extremity weakness: Improving, suspect infection-related worsening of general functional decline. No back pain or trauma or ongoing incontinence. Neuro exam with attention to legs is reassuring, and overall improved with fluids and abx.  - Continue monitoring exam. No further neuroimaging currently planned - Continue PT as much as possible here and possibly at SNF. Continues to require SNF level rehabilitation per reevaluation today.  UTI:  - Completed ceftriaxone with insignificant growth on culture. Fever explained more by covid-19. Will stop abx.   Covid-19 infection:  No evidence of pneumonia. CRP only 1.4. Vaccinated x2. Still at high risk for poor outcome due to age and comorbidities. Renal function too poor for paxlovid. Given hx vaccination, suspect impact of very early (within 6 hours of diagnosis) antiviral would be more beneficial than monoclonal antibody in this setting. D/w family and pt.  - Remdesivir x3 days (7/8 - 7/10). Monitor clinically. Isolate for 10 days per protocol. Will have to pursue covid-accepting facilites vs. intensive PT here and return home at DC. D/w RN to request PT reevaluation. Pt appears clinically much improved from the time of last evaluation.  - Supportive care for congestion.  - Monitor cytopenias (onset coincides with covid positivity) - IN atrovent prn  Stage IIIb CKD: Current CrCl slightly below 68ml/min but baseline likely 30-57ml/min.  - Avoid nephrotoxins.   Chronic HFpEF, HTN: Appears euvolemic. - Continue metoprolol, norvasc. Restart ACEi (lisinopril 20mg  daily instead of home BID for now) since renal function improved and BP elevated. Will also restart home lasix at reduced dosing.  - Hold metoprolol for HR <60bpm   History of CVA:  - Statin, ASA  T2DM:  - SSI  HLD:  - Statin  Hx NSCLC: Routine onc f/u.   Thrombocytopenia: No bleeding. Likely covid effect.  - Monitor again in AM. Still B > R for anticoagulation.  DVT prophylaxis: Lovenox Code Status: Full Family Communication: Niece by phone 7/12. Disposition Plan:  Status is: Inpatient due to Unsafe d/c plan  Dispo: The patient is from: Home              Anticipated d/c is to: SNF              Patient currently is medically stable  to d/c. though placement complicated by covid positivity.      Difficult to place patient No  Consultants:  None  Procedures:  None  Antimicrobials: Ceftriaxone  Remdesivir 7/8 - 7/10.  Subjective: Congestion improved, no dyspnea. Very mild cough. No chest pain. Sitting in the chair with assistance. No fever.    Objective: Vitals:   06/13/21 2126 06/14/21 0457 06/14/21 1241 06/14/21 1325  BP: (!) 147/64 (!) 155/57 (!) 104/51   Pulse: 71 (!) 58 (!) 54   Resp: 18 20 16    Temp: 98.1 F (36.7 C) 97.8 F (36.6 C) 97.6 F (36.4 C)   TempSrc: Oral Oral    SpO2: 100% 100% 100%   Weight:    51.7 kg  Height:    5\' 2"  (1.575 m)    Intake/Output Summary (Last 24 hours) at 06/14/2021 1945 Last data filed at 06/14/2021 1800 Gross per 24 hour  Intake 940 ml  Output 700 ml  Net 240 ml   Gen: 87 y.o. female in no distress Pulm: Nonlabored breathing room air. Clear. CV: Regular rate and rhythm. No murmur, rub, or gallop. No JVD, no dependent edema. GI: Abdomen soft, non-tender, non-distended, with normoactive bowel sounds.  Ext: Warm, no deformities Skin: No rashes, lesions or ulcers on visualized skin. Neuro: Alert and oriented. No focal neurological deficits. Psych: Judgement and insight appear fair. Mood euthymic & affect congruent. Behavior is appropriate.    Data Reviewed: I have personally reviewed following labs and imaging studies  CBC: Recent Labs  Lab 06/08/21 0413 06/10/21 0505 06/11/21 0345 06/14/21 0401  WBC 5.0 3.1* 3.5* 4.4  NEUTROABS  --  1.4* 1.8  --   HGB 10.3* 10.0* 11.0* 10.7*  HCT 33.6* 32.3* 35.2* 34.6*  MCV 87.5 87.3 86.3 85.2  PLT 158 135* 146* 782   Basic Metabolic Panel: Recent Labs  Lab 06/08/21 0413 06/09/21 0456 06/11/21 0345 06/14/21 0401  NA 138 136 140 135  K 3.8 3.5 3.6 3.5  CL 100 103 106 105  CO2 27 27 27 23   GLUCOSE 290* 144* 230* 248*  BUN 27* 22 19 29*  CREATININE 1.44* 1.30* 0.97 1.01*  CALCIUM 9.1 8.6* 8.6* 8.9   GFR: Estimated Creatinine Clearance: 30.5 mL/min (A) (by C-G formula based on SCr of 1.01 mg/dL (H)). Liver Function Tests: Recent Labs  Lab 06/11/21 0345  AST 30  ALT 21  ALKPHOS 52  BILITOT 0.4  PROT 6.4*  ALBUMIN 3.1*   No results for input(s): LIPASE, AMYLASE in the last 168 hours. No results for input(s):  AMMONIA in the last 168 hours. Coagulation Profile: No results for input(s): INR, PROTIME in the last 168 hours. Cardiac Enzymes: No results for input(s): CKTOTAL, CKMB, CKMBINDEX, TROPONINI in the last 168 hours. BNP (last 3 results) No results for input(s): PROBNP in the last 8760 hours. HbA1C: No results for input(s): HGBA1C in the last 72 hours. CBG: Recent Labs  Lab 06/07/21 2009  GLUCAP 115*   Lipid Profile: No results for input(s): CHOL, HDL, LDLCALC, TRIG, CHOLHDL, LDLDIRECT in the last 72 hours. Thyroid Function Tests: No results for input(s): TSH, T4TOTAL, FREET4, T3FREE, THYROIDAB in the last 72 hours. Anemia Panel: No results for input(s): VITAMINB12, FOLATE, FERRITIN, TIBC, IRON, RETICCTPCT in the last 72 hours.  Urine analysis:    Component Value Date/Time   COLORURINE YELLOW 06/07/2021 2202   APPEARANCEUR CLEAR 06/07/2021 2202   LABSPEC 1.010 06/07/2021 2202   PHURINE 7.0 06/07/2021 2202   GLUCOSEU NEGATIVE 06/07/2021  [place] (A) 06/07/2021 2202   BILIRUBINUR NEGATIVE 06/07/2021 2202   [place] 06/07/2021 2202   PROTEINUR 30 (A) 06/07/2021 2202   NITRITE NEGATIVE 06/07/2021 2202   LEUKOCYTESUR LARGE (A) 06/07/2021 2202   Recent Results (from the past 240 hour(s))  Urine culture     Status: Abnormal   Collection Time: 06/07/21 10:02 PM   Specimen: Urine, Random  Result Value Ref Range Status   Specimen Description   Final    URINE, RANDOM Performed at [facility], [place] [address], [place], [place] [address]    Special Requests   Final    NONE Performed at [facility], [place] [address] [place], [place] [address]    Culture (A)  Final    <10,000 COLONIES/mL INSIGNIFICANT GROWTH Performed at [facility] [address] [place], [place] [address]    Report Status 06/09/2021 FINAL  Final  Resp Panel by RT-PCR (Flu A&B, Covid) Nasopharyngeal Swab     Status: None   Collection Time:  06/07/21 11:19 PM   Specimen: Nasopharyngeal Swab; Nasopharyngeal(NP) swabs in vial transport medium  Result Value Ref Range Status   SARS Coronavirus 2 by RT PCR NEGATIVE NEGATIVE Final    Comment: (NOTE) SARS-CoV-2 target nucleic acids are NOT DETECTED.  The SARS-CoV-2 RNA is generally detectable in upper respiratory specimens during the acute phase of infection. The lowest concentration of SARS-CoV-2 viral copies this assay can detect is 138 copies/mL. A negative result does not preclude SARS-Cov-2 infection and should not be used as the sole basis for treatment or other patient management decisions. A negative result may occur with  improper specimen collection/handling, submission of specimen other than nasopharyngeal swab, presence of viral mutation(s) within the areas targeted by this assay, and inadequate number of viral copies(<138 copies/mL). A negative result must be combined with clinical observations, patient history, and epidemiological information. The expected result is Negative.  Fact Sheet for Patients:  EntrepreneurPulse.com.au  Fact Sheet for Healthcare Providers:  IncredibleEmployment.be  This test is no t yet approved or cleared by the [place] FDA and  has been authorized for detection and/or diagnosis of SARS-CoV-2 by FDA under an Emergency Use Authorization (EUA). This EUA will remain  in effect (meaning this test can be used) for the duration of the COVID-19 declaration under Section 564(b)(1) of the Act, 21 U.S.C.section 360bbb-3(b)(1), unless the authorization is terminated  or revoked sooner.       Influenza A by PCR NEGATIVE NEGATIVE Final   Influenza B by PCR NEGATIVE NEGATIVE Final    Comment: (NOTE) The Xpert Xpress SARS-CoV-2/FLU/RSV plus assay is intended as an aid in the diagnosis of influenza from Nasopharyngeal swab specimens and should not be used as a sole basis for treatment. Nasal washings  and aspirates are unacceptable for Xpert Xpress SARS-CoV-2/FLU/RSV testing.  Fact Sheet for Patients: EntrepreneurPulse.com.au  Fact Sheet for Healthcare Providers: IncredibleEmployment.be  This test is not yet approved or cleared by the [place] FDA and has been authorized for detection and/or diagnosis of SARS-CoV-2 by FDA under an Emergency Use Authorization (EUA). This EUA will remain in effect (meaning this test can be used) for the duration of the COVID-19 declaration under Section 564(b)(1) of the Act, 21 U.S.C. section 360bbb-3(b)(1), unless the authorization is terminated or revoked.  Performed at [facility], [place] [address] [place], [place] [address]   SARS CORONAVIRUS 2 (TAT 6-24 HRS) Nasopharyngeal Nasopharyngeal Swab  Status: Abnormal   Collection Time: 06/10/21 11:48 AM   Specimen: Nasopharyngeal Swab  Result Value Ref Range Status   SARS Coronavirus 2 POSITIVE (A) NEGATIVE Final    Comment: (NOTE) SARS-CoV-2 target nucleic acids are DETECTED.  The SARS-CoV-2 RNA is generally detectable in upper and lower respiratory specimens during the acute phase of infection. Positive results are indicative of the presence of SARS-CoV-2 RNA. Clinical correlation with patient history and other diagnostic information is  necessary to determine patient infection status. Positive results do not rule out bacterial infection or co-infection with other viruses.  The expected result is Negative.  Fact Sheet for Patients: SugarRoll.be  Fact Sheet for Healthcare Providers: https://www.woods-mathews.com/  This test is not yet approved or cleared by the [place] FDA and  has been authorized for detection and/or diagnosis of SARS-CoV-2 by FDA under an Emergency Use Authorization (EUA). This EUA will remain  in effect (meaning this test can be used) for the duration of  the COVID-19 declaration under Section 564(b)(1) of the Act, 21 U. S.C. section 360bbb-3(b)(1), unless the authorization is terminated or revoked sooner.   Performed at [facility], [place] [address], [place], [place] [address]   Resp Panel by RT-PCR (Flu A&B, Covid) Nasopharyngeal Swab     Status: Abnormal   Collection Time: 06/10/21  3:37 PM   Specimen: Nasopharyngeal Swab; Nasopharyngeal(NP) swabs in vial transport medium  Result Value Ref Range Status   SARS Coronavirus 2 by RT PCR POSITIVE (A) NEGATIVE Final    Comment: RESULT CALLED TO, READ BACK BY AND VERIFIED WITH: Coolidge Breeze RN [id number] 06/10/21 A BROWNING (NOTE) SARS-CoV-2 target nucleic acids are DETECTED.  The SARS-CoV-2 RNA is generally detectable in upper respiratory specimens during the acute phase of infection. Positive results are indicative of the presence of the identified virus, but do not rule out bacterial infection or co-infection with other pathogens not detected by the test. Clinical correlation with patient history and other diagnostic information is necessary to determine patient infection status. The expected result is Negative.  Fact Sheet for Patients: EntrepreneurPulse.com.au  Fact Sheet for Healthcare Providers: IncredibleEmployment.be  This test is not yet approved or cleared by the [place] FDA and  has been authorized for detection and/or diagnosis of SARS-CoV-2 by FDA under an Emergency Use Authorization (EUA).  This EUA will remain in effect (meaning this test can b e used) for the duration of  the COVID-19 declaration under Section 564(b)(1) of the Act, 21 U.S.C. section 360bbb-3(b)(1), unless the authorization is terminated or revoked sooner.     Influenza A by PCR NEGATIVE NEGATIVE Final   Influenza B by PCR NEGATIVE NEGATIVE Final    Comment: (NOTE) The Xpert Xpress SARS-CoV-2/FLU/RSV plus assay is intended as an aid in the diagnosis of  influenza from Nasopharyngeal swab specimens and should not be used as a sole basis for treatment. Nasal washings and aspirates are unacceptable for Xpert Xpress SARS-CoV-2/FLU/RSV testing.  Fact Sheet for Patients: EntrepreneurPulse.com.au  Fact Sheet for Healthcare Providers: IncredibleEmployment.be  This test is not yet approved or cleared by the [place] FDA and has been authorized for detection and/or diagnosis of SARS-CoV-2 by FDA under an Emergency Use Authorization (EUA). This EUA will remain in effect (meaning this test can be used) for the duration of the COVID-19 declaration under Section 564(b)(1) of the Act, 21 U.S.C. section 360bbb-3(b)(1), unless the authorization is terminated or revoked.  Performed at [facility], [place],  [place] [address]       Radiology Studies: No results found.  Scheduled Meds:  amLODipine  10 mg Oral Daily   vitamin C  500 mg Oral Daily   aspirin EC  81 mg Oral Daily   atorvastatin  40 mg Oral q1800   brimonidine  1 drop Left Eye BID   enoxaparin (LOVENOX) injection  40 mg Subcutaneous Q24H   furosemide  40 mg Oral Daily   gabapentin  400 mg Oral BID   ipratropium  2 spray Each Nare BID   latanoprost  1 drop Left Eye QHS   lisinopril  20 mg Oral Daily   metoprolol tartrate  12.5 mg Oral BID   potassium chloride  10 mEq Oral Daily   Vitamin D (Ergocalciferol)  50,000 Units Oral Weekly   zinc sulfate  220 mg Oral Daily   Continuous Infusions:     LOS: 5 days   Time spent: 25 minutes.  Patrecia Pour, MD [facility] [email or web address] 06/14/2021, 7:45 PM

## 2021-06-14 NOTE — Progress Notes (Signed)
Physical Therapy Treatment Patient Details Name: Erin Carr MRN: [id number] DOB: 25-Oct-1930 Today's Date: 06/14/2021    History of Present Illness Erin Carr is a 87 y.o. female with medical history significant for CVA with right LE foot drop, CHF, NSTEMI, NSCLC of the right lung s/p radiotherapy in remission, hypertension, asthma, type 2 diabetes, anemia and hyperlipidemia who presents with new onset lower extremity weakness suspect secondary to UTI    PT Comments    The patient is very sweet, ambulated x 50' with Rw around the room. SPO2 100%.  Performed IS  x 10, productive cough.  Follow Up Recommendations  SNF     Equipment Recommendations  None recommended by PT    Recommendations for Other Services       Precautions / Restrictions Precautions Precautions: Fall Precaution Comments: wears depends Required Braces or Orthoses: Other Brace Other Brace: right AFO    Mobility  Bed Mobility   Bed Mobility: Supine to Sit     Supine to sit: Min guard     General bed mobility comments: no assist required,    Transfers Overall transfer level: Needs assistance Equipment used: Rolling walker (2 wheeled) Transfers: Sit to/from Stand Sit to Stand: Min guard         General transfer comment: close guard  Ambulation/Gait Ambulation/Gait assistance: Min guard Gait Distance (Feet): 50 Feet Assistive device: Rolling walker (2 wheeled) Gait Pattern/deviations: Step-through pattern Gait velocity: decr   General Gait Details: patient managed 3 rounds around room, turning with Rw with min  guard.   Stairs             Wheelchair Mobility    Modified Rankin (Stroke Patients Only)       Balance Overall balance assessment: History of Falls;Needs assistance Sitting-balance support: Feet supported;Bilateral upper extremity supported Sitting balance-Leahy Scale: Fair     Standing balance support: During functional activity;Bilateral upper  extremity supported Standing balance-Leahy Scale: Fair Standing balance comment: reliant on UE's                            Cognition Arousal/Alertness: Awake/alert Behavior During Therapy: WFL for tasks assessed/performed                                          Exercises      General Comments        Pertinent Vitals/Pain Pain Assessment: No/denies pain    Home Living                      Prior Function            PT Goals (current goals can now be found in the care plan section) Progress towards PT goals: Progressing toward goals    Frequency    Min 2X/week      PT Plan Current plan remains appropriate    Co-evaluation              AM-PAC PT "6 Clicks" Mobility   Outcome Measure  Help needed turning from your back to your side while in a flat bed without using bedrails?: A Little Help needed moving from lying on your back to sitting on the side of a flat bed without using bedrails?: A Little Help needed moving to and from a bed to a chair (including a  wheelchair)?: A Little Help needed standing up from a chair using your arms (e.g., wheelchair or bedside chair)?: A Little Help needed to walk in hospital room?: A Little Help needed climbing 3-5 steps with a railing? : A Lot 6 Click Score: 17    End of Session Equipment Utilized During Treatment: Gait belt Activity Tolerance: Patient tolerated treatment well Patient left: in chair;with call bell/phone within reach Nurse Communication: Mobility status PT Visit Diagnosis: Unsteadiness on feet (R26.81);Difficulty in walking, not elsewhere classified (R26.2)     Time: 4970-2637 PT Time Calculation (min) (ACUTE ONLY): 36 min  Charges:  $Gait Training: 8-22 mins $Self Care/Home Management: [place] Pager [phone] Office [phone]    Claretha Cooper 06/14/2021, 1:11 PM

## 2021-06-14 NOTE — Plan of Care (Signed)
  Problem: Activity: Goal: Risk for activity intolerance will decrease Outcome: Completed/Met

## 2021-06-15 DIAGNOSIS — N3000 Acute cystitis without hematuria: Secondary | ICD-10-CM | POA: Diagnosis not present

## 2021-06-15 DIAGNOSIS — N179 Acute kidney failure, unspecified: Secondary | ICD-10-CM | POA: Diagnosis not present

## 2021-06-15 DIAGNOSIS — R29898 Other symptoms and signs involving the musculoskeletal system: Secondary | ICD-10-CM | POA: Diagnosis not present

## 2021-06-15 DIAGNOSIS — Z85118 Personal history of other malignant neoplasm of bronchus and lung: Secondary | ICD-10-CM | POA: Diagnosis not present

## 2021-06-15 DIAGNOSIS — I5032 Chronic diastolic (congestive) heart failure: Secondary | ICD-10-CM

## 2021-06-15 NOTE — TOC Progression Note (Signed)
Transition of Care [facility]) - Progression Note    Patient Details  Name: Erin Carr MRN: [id number] Date of Birth: 1930-11-07  [facility] CM/SW Contact  Ross Ludwig, [place] Phone Number: 06/15/2021, 1:53 PM  Clinical Narrative:    CSW continuing to follow patient's progress.  Patient was Covid+ on 06/10/2021, most SNFs can not accept patient till 11 days post covid which will be 06/21/2021.  CSW will resend clinicals to SNFs to see if any are able to accept Covid+ patient.    Expected Discharge Plan: Skilled Nursing Facility Barriers to Discharge: Other (must enter comment) (COVID test results, d/c summary)  Expected Discharge Plan and Services Expected Discharge Plan: [place]   Discharge Planning Services: CM Consult Post Acute Care Choice: [place] Living arrangements for the past 2 months: Single Family Home Expected Discharge Date: 06/10/21                                     Social Determinants of Health (SDOH) Interventions    Readmission Risk Interventions No flowsheet data found.

## 2021-06-15 NOTE — [place] FL2 (Addendum)
[place] MEDICAID FL2 LEVEL OF CARE SCREENING TOOL     IDENTIFICATION  Patient Name: Erin Carr Birthdate: August 18, 1930 Sex: female Admission Date (Current Location): 06/07/2021  [facility] and [place] Number:  Herbalist and Address:  [facility],  [place], [place]      Provider Number: [id number]  Attending Physician Name and Address:  Aline August, MD  Relative Name and Phone Number:  Smith,Marie Niece   [phone]  Ronna Polio [phone]    Laverle Patter Niece [phone]  Christeen Douglas Niece   [phone]    Current Level of Care: Hospital Recommended Level of Care: [place] Prior Approval Number:    Date Approved/Denied:   PASRR Number: [id number]  Discharge Plan: SNF    Current Diagnoses: Patient Active Problem List   Diagnosis Date Noted   Weakness of both legs 49/44/9675   Chronic systolic CHF (congestive heart failure) ([place]) 06/08/2021   History of lung cancer 06/08/2021   AKI (acute kidney injury) ([place]) 06/08/2021   UTI (urinary tract infection) 06/07/2021   Overweight with body mass index (BMI) 25.0-29.9 04/03/2018   History of cystocele 04/03/2018   Candidiasis of vagina 04/03/2018   Diabetes mellitus ([place]) 04/03/2018   Hyperlipidemia 04/03/2018   Hypertensive disorder 04/03/2018   Postmenopausal bleeding 04/03/2018   Urinary incontinence 04/03/2018   Acute CHF (congestive heart failure) ([place]) 10/22/2017   Foot drop 10/22/2017   Facial droop 10/22/2017   NSTEMI (non-ST elevated myocardial infarction) ([place]) 10/22/2017   Fall 10/22/2017   Leukocytosis 10/22/2017   Radiation pneumonitis ([place]) 10/22/2017   Foot drop, right    Shortness of breath 08/09/2017   Microcytic anemia 07/18/2017   CAP (community acquired pneumonia) 07/18/2017   Asthma, chronic, unspecified asthma severity, with acute exacerbation 07/18/2017   Type 2 diabetes mellitus without  complication ([place]) 91/63/8466   Adenocarcinoma of right lung, stage 1 ([place]) 11/16/2016   Neoplasm of uncertain behavior of right upper lobe of lung 12/01/2015   Positive PPD    Pelvic prolapse 12/16/2014    Orientation RESPIRATION BLADDER Height & Weight     Place, Self  Normal Incontinent Weight: 113 lb 15.7 oz (51.7 kg) Height:  5\' 2"  (157.5 cm)  BEHAVIORAL SYMPTOMS/MOOD NEUROLOGICAL BOWEL NUTRITION STATUS   (none)  (none) Continent Diet (Regular diet)  AMBULATORY STATUS COMMUNICATION OF NEEDS Skin   Limited Assist Verbally Normal                       Personal Care Assistance Level of Assistance  Bathing, Feeding, Dressing Bathing Assistance: Limited assistance Feeding assistance: Limited assistance Dressing Assistance: Limited assistance     Functional Limitations Info  Sight, Hearing, Speech Sight Info: Adequate Hearing Info: Adequate Speech Info: Adequate    SPECIAL CARE FACTORS FREQUENCY  PT (By licensed PT), OT (By licensed OT)     PT Frequency: Minimum 5x a week OT Frequency: Minimum 5x a week            Contractures Contractures Info: Not present    Additional Factors Info  Code Status, Allergies, Isolation Precautions Code Status Info: Full Code Allergies Info: NKA     Isolation Precautions Info: Air/con due to Covid+ since 06/10/2021      Current Medications (06/15/2021):  This is the current hospital active medication list Current Facility-Administered Medications  Medication Dose Route Frequency Provider Last Rate Last Admin   acetaminophen (TYLENOL) tablet 650 mg  650 mg Oral Q4H  PRN Patrecia Pour, MD   650 mg at 06/09/21 1251   amLODipine (NORVASC) tablet 10 mg  10 mg Oral Daily Tu, Ching T, DO   10 mg at 06/15/21 0915   ascorbic acid (VITAMIN C) tablet 500 mg  500 mg Oral Daily Vance Gather B, MD   500 mg at 06/15/21 0915   aspirin EC tablet 81 mg  81 mg Oral Daily Tu, Ching T, DO   81 mg at 06/15/21 0915   atorvastatin (LIPITOR) tablet 40  mg  40 mg Oral q1800 Tu, Ching T, DO   40 mg at 06/15/21 1745   brimonidine (ALPHAGAN) 0.2 % ophthalmic solution 1 drop  1 drop Left Eye BID Tu, Ching T, DO   1 drop at 06/15/21 0916   enoxaparin (LOVENOX) injection 40 mg  40 mg Subcutaneous Q24H Tu, Ching T, DO   40 mg at 06/15/21 0914   furosemide (LASIX) tablet 40 mg  40 mg Oral Daily Vance Gather B, MD   40 mg at 06/15/21 0915   gabapentin (NEURONTIN) capsule 400 mg  400 mg Oral BID Tu, Ching T, DO   400 mg at 06/15/21 0915   guaiFENesin-dextromethorphan (ROBITUSSIN DM) 100-10 MG/5ML syrup 5 mL  5 mL Oral Q4H PRN Tu, Ching T, DO   5 mL at 06/15/21 1142   ipratropium (ATROVENT) 0.03 % nasal spray 2 spray  2 spray Each Nare BID Patrecia Pour, MD   2 spray at 06/15/21 0916   latanoprost (XALATAN) 0.005 % ophthalmic solution 1 drop  1 drop Left Eye QHS Tu, Ching T, DO   1 drop at 06/14/21 2120   lisinopril (ZESTRIL) tablet 20 mg  20 mg Oral Daily Patrecia Pour, MD   20 mg at 06/15/21 0915   metoprolol tartrate (LOPRESSOR) tablet 12.5 mg  12.5 mg Oral BID Vance Gather B, MD   12.5 mg at 06/15/21 0916   potassium chloride (KLOR-CON) CR tablet 10 mEq  10 mEq Oral Daily Tu, Ching T, DO   10 mEq at 06/15/21 0915   Vitamin D (Ergocalciferol) (DRISDOL) capsule 50,000 Units  50,000 Units Oral Weekly Tu, Ching T, DO   50,000 Units at 06/13/21 3664   zinc sulfate capsule 220 mg  220 mg Oral Daily Patrecia Pour, MD   220 mg at 06/15/21 4034     Discharge Medications: Please see discharge summary for a list of discharge medications.  Relevant Imaging Results:  Relevant Lab Results:   Additional Information SSN [id number]  Ross Ludwig, LCSW

## 2021-06-15 NOTE — Progress Notes (Signed)
Patient ID: Erin Carr, female   DOB: 1930-08-27, 87 y.o.   MRN: [id number]  PROGRESS NOTE    FOREST PRUDEN  NLZ:[id number] DOB: 08/24/1930 DOA: 06/07/2021 PCP: Vincente Liberty, MD   Brief Narrative:  87 year old female with history of unspecified CVA, right foot drop, non-STEMI, chronic diastolic CHF, NSCLC in remission status post radiotherapy, hypertension, diabetes mellitus type 2, asthma and hyperlipidemia presented with bilateral leg weakness along with urinary incontinence.  CT of the head showed no acute stroke, creatinine elevated at 1.46 and a UA had significant pyuria.  She was started on IV fluids and IV Rocephin.  PT recommended SNF placement.  Patient subsequently developed fever and congestion and tested positive for COVID-19.  No evidence of pneumonia.  Treated with 3 doses of IV remdesivir.  Currently medically stable for discharge; SNF rehab complicated due to COVID-positive status.  Assessment & Plan:   Lower extremity weakness -Possibly infection related worsening of general functional decline. -Improving.  PT recommended SNF placement.  CT of the head was negative for acute abnormality.  UTI -Insignificant growth on culture.  Treated with Rocephin and subsequently discontinued  COVID-19 infection -No evidence of pneumonia.  CRP not significantly elevated.  Patient is vaccinated x2 -Status post 3 doses of remdesivir from 06/10/2021-06/12/2021.  Isolate for 10 days -Respiratory status currently stable  Stage IIIb CKD -Creatinine currently stable  Thrombocytopenia -Resolved  Leukopenia--resolved  Chronic diastolic CHF -Euvolemic.  Continue Lasix, lisinopril and metoprolol.  Strict input and output.  Daily weights.  Fluid restriction  History of unspecified CVA -Continue aspirin and statin  Diabetes mellitus type 2 with hyperglycemia -CBGs with SSI  Hyperlipidemia -Continue statin  History of NSCLC -Outpatient follow-up with oncology  Generalized  deconditioning -Palliative care consultation for goals of care discussion   DVT prophylaxis: Lovenox Code Status: Full Family Communication: None at bedside Disposition Plan: Status is: Inpatient  Remains inpatient appropriate because:Inpatient level of care appropriate due to severity of illness  Dispo: The patient is from: Home              Anticipated d/c is to: SNF              Patient currently is medically stable to d/c.   Difficult to place patient Yes   Consultants: None  Procedures: None  Antimicrobials:  Anti-infectives (From admission, onward)    Start     Dose/Rate Route Frequency Ordered Stop   06/11/21 1000  remdesivir 100 mg in sodium chloride 0.9 % 100 mL IVPB       See Hyperspace for full Linked Orders Report.   100 mg 200 mL/hr over 30 Minutes Intravenous Daily 06/10/21 1725 06/12/21 0926   06/10/21 1900  remdesivir 200 mg in sodium chloride 0.9% 250 mL IVPB       See Hyperspace for full Linked Orders Report.   200 mg 580 mL/hr over 30 Minutes Intravenous Once 06/10/21 1725 06/10/21 2128   06/10/21 1800  nirmatrelvir/ritonavir EUA (renal dosing) (PAXLOVID) TABS 2 tablet  Status:  Discontinued        2 tablet Oral 2 times daily 06/10/21 1706 06/10/21 1720   06/10/21 0000  cephALEXin (KEFLEX) 500 MG capsule        500 mg Oral 2 times daily 06/10/21 1555     06/09/21 0000  cefTRIAXone (ROCEPHIN) 1 g in sodium chloride 0.9 % 100 mL IVPB  Status:  Discontinued        1 g 200 mL/hr over 30 Minutes  Intravenous Every 24 hours 06/07/21 2353 06/11/21 0722   06/07/21 2315  cefTRIAXone (ROCEPHIN) 1 g in sodium chloride 0.9 % 100 mL IVPB        1 g 200 mL/hr over 30 Minutes Intravenous  Once 06/07/21 2306 06/08/21 0054        Subjective: Patient seen and examined at bedside.  No overnight fever, vomiting or worsening shortness of breath reported.  Poor historian.  Objective: Vitals:   06/14/21 1241 06/14/21 1325 06/14/21 2115 06/15/21 0412  BP: (!) 104/51   (!) 131/56 126/66  Pulse: (!) 54  65 65  Resp: 16  16 18   Temp: 97.6 F (36.4 C)  97.8 F (36.6 C) 98.1 F (36.7 C)  TempSrc:      SpO2: 100%  100% 99%  Weight:  51.7 kg    Height:  5\' 2"  (1.575 m)      Intake/Output Summary (Last 24 hours) at 06/15/2021 1216 Last data filed at 06/15/2021 0900 Gross per 24 hour  Intake 840 ml  Output 450 ml  Net 390 ml   Filed Weights   06/14/21 1325  Weight: 51.7 kg    Examination:  General exam: Elderly female lying in bed.  Currently on room air.  Poor historian. Respiratory system: Bilateral decreased breath sounds at bases with some scattered crackles Cardiovascular system: S1 & S2 heard, Rate controlled Gastrointestinal system: Abdomen is nondistended, soft and nontender. Normal bowel sounds heard. Extremities: No cyanosis, clubbing, edema  Central nervous system: Awake, slightly confused.  Slow to respond.  No focal neurological deficits. Moving extremities Skin: No rashes, lesions or ulcers Psychiatry: Mostly flat affect.   Data Reviewed: I have personally reviewed following labs and imaging studies  CBC: Recent Labs  Lab 06/10/21 0505 06/11/21 0345 06/14/21 0401  WBC 3.1* 3.5* 4.4  NEUTROABS 1.4* 1.8  --   HGB 10.0* 11.0* 10.7*  HCT 32.3* 35.2* 34.6*  MCV 87.3 86.3 85.2  PLT 135* 146* 993   Basic Metabolic Panel: Recent Labs  Lab 06/09/21 0456 06/11/21 0345 06/14/21 0401  NA 136 140 135  K 3.5 3.6 3.5  CL 103 106 105  CO2 27 27 23   GLUCOSE 144* 230* 248*  BUN 22 19 29*  CREATININE 1.30* 0.97 1.01*  CALCIUM 8.6* 8.6* 8.9   GFR: Estimated Creatinine Clearance: 30.5 mL/min (A) (by C-G formula based on SCr of 1.01 mg/dL (H)). Liver Function Tests: Recent Labs  Lab 06/11/21 0345  AST 30  ALT 21  ALKPHOS 52  BILITOT 0.4  PROT 6.4*  ALBUMIN 3.1*   No results for input(s): LIPASE, AMYLASE in the last 168 hours. No results for input(s): AMMONIA in the last 168 hours. Coagulation Profile: No results for  input(s): INR, PROTIME in the last 168 hours. Cardiac Enzymes: No results for input(s): CKTOTAL, CKMB, CKMBINDEX, TROPONINI in the last 168 hours. BNP (last 3 results) No results for input(s): PROBNP in the last 8760 hours. HbA1C: No results for input(s): HGBA1C in the last 72 hours. CBG: No results for input(s): GLUCAP in the last 168 hours. Lipid Profile: No results for input(s): CHOL, HDL, LDLCALC, TRIG, CHOLHDL, LDLDIRECT in the last 72 hours. Thyroid Function Tests: No results for input(s): TSH, T4TOTAL, FREET4, T3FREE, THYROIDAB in the last 72 hours. Anemia Panel: No results for input(s): VITAMINB12, FOLATE, FERRITIN, TIBC, IRON, RETICCTPCT in the last 72 hours. Sepsis Labs: Recent Labs  Lab 06/10/21 1909  PROCALCITON <0.10    Recent Results (from the past 240 hour(s))  Urine culture     Status: Abnormal   Collection Time: 06/07/21 10:02 PM   Specimen: Urine, Random  Result Value Ref Range Status   Specimen Description   Final    URINE, RANDOM Performed at [place] [address] [place], [place] [address]    Special Requests   Final    NONE Performed at [facility], [place] [address] [place], [place] [address]    Culture (A)  Final    <10,000 COLONIES/mL INSIGNIFICANT GROWTH Performed at [place] [address], [place], [place] [address]    Report Status 06/09/2021 FINAL  Final  Resp Panel by RT-PCR (Flu A&B, Covid) Nasopharyngeal Swab     Status: None   Collection Time: 06/07/21 11:19 PM   Specimen: Nasopharyngeal Swab; Nasopharyngeal(NP) swabs in vial transport medium  Result Value Ref Range Status   SARS Coronavirus 2 by RT PCR NEGATIVE NEGATIVE Final    Comment: (NOTE) SARS-CoV-2 target nucleic acids are NOT DETECTED.  The SARS-CoV-2 RNA is generally detectable in upper respiratory specimens during the acute phase of infection. The lowest concentration of SARS-CoV-2 viral copies this assay can detect  is 138 copies/mL. A negative result does not preclude SARS-Cov-2 infection and should not be used as the sole basis for treatment or other patient management decisions. A negative result may occur with  improper specimen collection/handling, submission of specimen other than nasopharyngeal swab, presence of viral mutation(s) within the areas targeted by this assay, and inadequate number of viral copies(<138 copies/mL). A negative result must be combined with clinical observations, patient history, and epidemiological information. The expected result is Negative.  Fact Sheet for Patients:  EntrepreneurPulse.com.au  Fact Sheet for Healthcare Providers:  IncredibleEmployment.be  This test is no t yet approved or cleared by the [place] FDA and  has been authorized for detection and/or diagnosis of SARS-CoV-2 by FDA under an Emergency Use Authorization (EUA). This EUA will remain  in effect (meaning this test can be used) for the duration of the COVID-19 declaration under Section 564(b)(1) of the Act, 21 U.S.C.section 360bbb-3(b)(1), unless the authorization is terminated  or revoked sooner.       Influenza A by PCR NEGATIVE NEGATIVE Final   Influenza B by PCR NEGATIVE NEGATIVE Final    Comment: (NOTE) The Xpert Xpress SARS-CoV-2/FLU/RSV plus assay is intended as an aid in the diagnosis of influenza from Nasopharyngeal swab specimens and should not be used as a sole basis for treatment. Nasal washings and aspirates are unacceptable for Xpert Xpress SARS-CoV-2/FLU/RSV testing.  Fact Sheet for Patients: EntrepreneurPulse.com.au  Fact Sheet for Healthcare Providers: IncredibleEmployment.be  This test is not yet approved or cleared by the [place] FDA and has been authorized for detection and/or diagnosis of SARS-CoV-2 by FDA under an Emergency Use Authorization (EUA). This EUA will remain in effect  (meaning this test can be used) for the duration of the COVID-19 declaration under Section 564(b)(1) of the Act, 21 U.S.C. section 360bbb-3(b)(1), unless the authorization is terminated or revoked.  Performed at [facility], [place] [address] [place], [place] [address]   SARS CORONAVIRUS 2 (TAT 6-24 HRS) Nasopharyngeal Nasopharyngeal Swab     Status: Abnormal   Collection Time: 06/10/21 11:48 AM   Specimen: Nasopharyngeal Swab  Result Value Ref Range Status   SARS Coronavirus 2 POSITIVE (A) NEGATIVE Final    Comment: (NOTE) SARS-CoV-2 target nucleic acids are DETECTED.  The SARS-CoV-2 RNA is generally detectable in  upper and lower respiratory specimens during the acute phase of infection. Positive results are indicative of the presence of SARS-CoV-2 RNA. Clinical correlation with patient history and other diagnostic information is  necessary to determine patient infection status. Positive results do not rule out bacterial infection or co-infection with other viruses.  The expected result is Negative.  Fact Sheet for Patients: SugarRoll.be  Fact Sheet for Healthcare Providers: https://www.woods-mathews.com/  This test is not yet approved or cleared by the [place] FDA and  has been authorized for detection and/or diagnosis of SARS-CoV-2 by FDA under an Emergency Use Authorization (EUA). This EUA will remain  in effect (meaning this test can be used) for the duration of the COVID-19 declaration under Section 564(b)(1) of the Act, 21 U. S.C. section 360bbb-3(b)(1), unless the authorization is terminated or revoked sooner.   Performed at [facility], [place] [address] [place], [place] [address]   Resp Panel by RT-PCR (Flu A&B, Covid) Nasopharyngeal Swab     Status: Abnormal   Collection Time: 06/10/21  3:37 PM   Specimen: Nasopharyngeal Swab; Nasopharyngeal(NP) swabs in vial transport medium  Result Value Ref  Range Status   SARS Coronavirus 2 by RT PCR POSITIVE (A) NEGATIVE Final    Comment: RESULT CALLED TO, READ BACK BY AND VERIFIED WITH: Coolidge Breeze RN [id number] 06/10/21 A BROWNING (NOTE) SARS-CoV-2 target nucleic acids are DETECTED.  The SARS-CoV-2 RNA is generally detectable in upper respiratory specimens during the acute phase of infection. Positive results are indicative of the presence of the identified virus, but do not rule out bacterial infection or co-infection with other pathogens not detected by the test. Clinical correlation with patient history and other diagnostic information is necessary to determine patient infection status. The expected result is Negative.  Fact Sheet for Patients: EntrepreneurPulse.com.au  Fact Sheet for Healthcare Providers: IncredibleEmployment.be  This test is not yet approved or cleared by the [place] FDA and  has been authorized for detection and/or diagnosis of SARS-CoV-2 by FDA under an Emergency Use Authorization (EUA).  This EUA will remain in effect (meaning this test can b e used) for the duration of  the COVID-19 declaration under Section 564(b)(1) of the Act, 21 U.S.C. section 360bbb-3(b)(1), unless the authorization is terminated or revoked sooner.     Influenza A by PCR NEGATIVE NEGATIVE Final   Influenza B by PCR NEGATIVE NEGATIVE Final    Comment: (NOTE) The Xpert Xpress SARS-CoV-2/FLU/RSV plus assay is intended as an aid in the diagnosis of influenza from Nasopharyngeal swab specimens and should not be used as a sole basis for treatment. Nasal washings and aspirates are unacceptable for Xpert Xpress SARS-CoV-2/FLU/RSV testing.  Fact Sheet for Patients: EntrepreneurPulse.com.au  Fact Sheet for Healthcare Providers: IncredibleEmployment.be  This test is not yet approved or cleared by the [place] FDA and has been authorized for detection and/or  diagnosis of SARS-CoV-2 by FDA under an Emergency Use Authorization (EUA). This EUA will remain in effect (meaning this test can be used) for the duration of the COVID-19 declaration under Section 564(b)(1) of the Act, 21 U.S.C. section 360bbb-3(b)(1), unless the authorization is terminated or revoked.  Performed at [facility], [place] [address], [place], [place] [address]          Radiology Studies: No results found.      Scheduled Meds:  amLODipine  10 mg Oral Daily   vitamin C  500 mg Oral Daily   aspirin EC  81 mg Oral Daily  atorvastatin  40 mg Oral q1800   brimonidine  1 drop Left Eye BID   enoxaparin (LOVENOX) injection  40 mg Subcutaneous Q24H   furosemide  40 mg Oral Daily   gabapentin  400 mg Oral BID   ipratropium  2 spray Each Nare BID   latanoprost  1 drop Left Eye QHS   lisinopril  20 mg Oral Daily   metoprolol tartrate  12.5 mg Oral BID   potassium chloride  10 mEq Oral Daily   Vitamin D (Ergocalciferol)  50,000 Units Oral Weekly   zinc sulfate  220 mg Oral Daily   Continuous Infusions:        Aline August, MD [facility] 06/15/2021, 12:16 PM

## 2021-06-16 DIAGNOSIS — Z515 Encounter for palliative care: Secondary | ICD-10-CM

## 2021-06-16 DIAGNOSIS — N3000 Acute cystitis without hematuria: Secondary | ICD-10-CM | POA: Diagnosis not present

## 2021-06-16 DIAGNOSIS — I5022 Chronic systolic (congestive) heart failure: Secondary | ICD-10-CM | POA: Diagnosis not present

## 2021-06-16 DIAGNOSIS — N179 Acute kidney failure, unspecified: Secondary | ICD-10-CM | POA: Diagnosis not present

## 2021-06-16 DIAGNOSIS — U071 COVID-19: Secondary | ICD-10-CM | POA: Diagnosis not present

## 2021-06-16 DIAGNOSIS — R29898 Other symptoms and signs involving the musculoskeletal system: Secondary | ICD-10-CM | POA: Diagnosis not present

## 2021-06-16 DIAGNOSIS — Z7189 Other specified counseling: Secondary | ICD-10-CM

## 2021-06-16 LAB — CBC WITH DIFFERENTIAL/PLATELET
Abs Immature Granulocytes: 0.03 10*3/uL (ref 0.00–0.07)
Basophils Absolute: 0 10*3/uL (ref 0.0–0.1)
Basophils Relative: 0 %
Eosinophils Absolute: 0.3 10*3/uL (ref 0.0–0.5)
Eosinophils Relative: 5 %
HCT: 34.4 % — ABNORMAL LOW (ref 36.0–46.0)
Hemoglobin: 10.8 g/dL — ABNORMAL LOW (ref 12.0–15.0)
Immature Granulocytes: 1 %
Lymphocytes Relative: 22 %
Lymphs Abs: 1.2 10*3/uL (ref 0.7–4.0)
MCH: 26.7 pg (ref 26.0–34.0)
MCHC: 31.4 g/dL (ref 30.0–36.0)
MCV: 85.1 fL (ref 80.0–100.0)
Monocytes Absolute: 0.7 10*3/uL (ref 0.1–1.0)
Monocytes Relative: 13 %
Neutro Abs: 3.3 10*3/uL (ref 1.7–7.7)
Neutrophils Relative %: 59 %
Platelets: 263 10*3/uL (ref 150–400)
RBC: 4.04 MIL/uL (ref 3.87–5.11)
RDW: 13.6 % (ref 11.5–15.5)
WBC: 5.5 10*3/uL (ref 4.0–10.5)
nRBC: 0 % (ref 0.0–0.2)

## 2021-06-16 LAB — COMPREHENSIVE METABOLIC PANEL
ALT: 24 U/L (ref 0–44)
AST: 22 U/L (ref 15–41)
Albumin: 3 g/dL — ABNORMAL LOW (ref 3.5–5.0)
Alkaline Phosphatase: 81 U/L (ref 38–126)
Anion gap: 5 (ref 5–15)
BUN: 25 mg/dL — ABNORMAL HIGH (ref 8–23)
CO2: 30 mmol/L (ref 22–32)
Calcium: 9.2 mg/dL (ref 8.9–10.3)
Chloride: 104 mmol/L (ref 98–111)
Creatinine, Ser: 1.14 mg/dL — ABNORMAL HIGH (ref 0.44–1.00)
GFR, Estimated: 46 mL/min — ABNORMAL LOW (ref >60)
Glucose, Bld: 308 mg/dL — ABNORMAL HIGH (ref 70–99)
Potassium: 4.5 mmol/L (ref 3.5–5.1)
Sodium: 139 mmol/L (ref 135–145)
Total Bilirubin: 0.6 mg/dL (ref 0.3–1.2)
Total Protein: 5.8 g/dL — ABNORMAL LOW (ref 6.5–8.1)

## 2021-06-16 LAB — C-REACTIVE PROTEIN: CRP: 0.8 mg/dL (ref <1.0)

## 2021-06-16 LAB — D-DIMER, QUANTITATIVE: D-Dimer, Quant: 0.87 ug/mL-FEU — ABNORMAL HIGH (ref 0.00–0.50)

## 2021-06-16 LAB — MAGNESIUM: Magnesium: 2.3 mg/dL (ref 1.7–2.4)

## 2021-06-16 MED ORDER — ENOXAPARIN SODIUM 30 MG/0.3ML IJ SOSY
30.0000 mg | PREFILLED_SYRINGE | INTRAMUSCULAR | Status: DC
  Administered 2021-06-17 – 2021-06-20 (×4): 30 mg via SUBCUTANEOUS
  Filled 2021-06-16 (×4): qty 0.3

## 2021-06-16 NOTE — TOC Progression Note (Addendum)
Transition of Care [facility]) - Progression Note    Patient Details  Name: SLOANE PALMER MRN: [id number] Date of Birth: 1930/04/08  [facility] of [facility]) CM/SW Contact  Ross Ludwig, [place] Phone Number: 06/16/2021, 5:40 PM  Clinical Narrative:     CSW checked to see if a different SNF can accept a Covid+ unfortunately the only option is [facility] and they do not have a bed available till Monday.  CSW also spoke to [facility] which is the patient's preferred facility.  [facility] patient should be able to accept patient on Monday.    Patient's twin sister is on hospice services at [facility], which is the reason [facility] is the preferred facility.  CSW to continue to follow patient's progress throughout discharge planning.   Expected Discharge Plan: Skilled Nursing Facility Barriers to Discharge: Other (must enter comment) (COVID test results, d/c summary)  Expected Discharge Plan and Services Expected Discharge Plan: [place]   Discharge Planning Services: CM Consult Post Acute Care Choice: [place] Living arrangements for the past 2 months: Single Family Home Expected Discharge Date: 06/10/21                                     Social Determinants of Health (SDOH) Interventions    Readmission Risk Interventions No flowsheet data found.

## 2021-06-16 NOTE — Plan of Care (Signed)
  Problem: Health Behavior/Discharge Planning: Goal: Ability to manage health-related needs will improve Outcome: Progressing   Problem: Clinical Measurements: Goal: Ability to maintain clinical measurements within normal limits will improve Outcome: Progressing Goal: Will remain free from infection Outcome: Progressing Goal: Diagnostic test results will improve Outcome: Progressing Goal: Respiratory complications will improve Outcome: Progressing Goal: Cardiovascular complication will be avoided Outcome: Progressing   Problem: Nutrition: Goal: Adequate nutrition will be maintained Outcome: Progressing   Problem: Coping: Goal: Level of anxiety will decrease Outcome: Progressing   Problem: Elimination: Goal: Will not experience complications related to bowel motility Outcome: Progressing Goal: Will not experience complications related to urinary retention Outcome: Progressing   Problem: Pain Managment: Goal: General experience of comfort will improve Outcome: Progressing   Problem: Safety: Goal: Ability to remain free from injury will improve Outcome: Progressing   Problem: Skin Integrity: Goal: Risk for impaired skin integrity will decrease Outcome: Progressing   Problem: Urinary Elimination: Goal: Signs and symptoms of infection will decrease Outcome: Progressing

## 2021-06-16 NOTE — Progress Notes (Signed)
Patient ID: Erin Carr, female   DOB: [birth year], 87 y.o.   MRN: [id number]  PROGRESS NOTE    [name]  FIE:[id number] DOB: 11-30-1930 DOA: 06/07/2021 PCP: Vincente [facility], MD   Brief Narrative:  87 year old female with history of unspecified CVA, right foot drop, non-STEMI, chronic diastolic CHF, NSCLC in remission status post radiotherapy, hypertension, diabetes mellitus type 2, asthma and hyperlipidemia presented with bilateral leg weakness along with urinary incontinence.  CT of the head showed no acute stroke, creatinine elevated at 1.46 and a UA had significant pyuria.  She was started on IV fluids and IV Rocephin.  PT recommended SNF placement.  Patient subsequently developed fever and congestion and tested positive for COVID-19.  No evidence of pneumonia.  Treated with 3 doses of IV remdesivir.  Currently medically stable for discharge; SNF rehab complicated due to COVID-positive status.  Assessment & Plan:   Lower extremity weakness -Possibly infection related worsening of general functional decline. -Improving.  PT recommended SNF placement.  CT of the head was negative for acute abnormality.  UTI -Insignificant growth on culture.  Treated with Rocephin and subsequently discontinued  COVID-19 infection -No evidence of pneumonia.  CRP not significantly elevated.  Patient is vaccinated x2 -Status post 3 doses of remdesivir from 06/10/2021-06/12/2021.  Isolate for 10 days -Respiratory status currently stable  Stage IIIb CKD -Creatinine currently stable  Thrombocytopenia -Resolved  Leukopenia--resolved  Chronic diastolic CHF -Euvolemic.  Continue Lasix, lisinopril and metoprolol.  Strict input and output.  Daily weights.  Fluid restriction  History of unspecified CVA -Continue aspirin and statin  Diabetes mellitus type 2 with hyperglycemia -CBGs with SSI  Hyperlipidemia -Continue statin  History of NSCLC -Outpatient follow-up with oncology  Generalized  deconditioning -Palliative care consultation for goals of care discussion   DVT prophylaxis: Lovenox Code Status: Full Family Communication: None at bedside Disposition Plan: Status is: Inpatient  Remains inpatient appropriate because:Inpatient level of care appropriate due to severity of illness  Dispo: The patient is from: Home              Anticipated d/c is to: SNF              Patient currently is medically stable to d/c.   Difficult to place patient Yes   Consultants: None  Procedures: None  Antimicrobials:  Anti-infectives (From admission, onward)    Start     Dose/Rate Route Frequency Ordered Stop   06/11/21 1000  remdesivir 100 mg in sodium chloride 0.9 % 100 mL IVPB       See Hyperspace for full Linked Orders Report.   100 mg 200 mL/hr over 30 Minutes Intravenous Daily 06/10/21 1725 06/12/21 0926   06/10/21 1900  remdesivir 200 mg in sodium chloride 0.9% 250 mL IVPB       See Hyperspace for full Linked Orders Report.   200 mg 580 mL/hr over 30 Minutes Intravenous Once 06/10/21 1725 06/10/21 2128   06/10/21 1800  nirmatrelvir/ritonavir EUA (renal dosing) (PAXLOVID) TABS 2 tablet  Status:  Discontinued        2 tablet Oral 2 times daily 06/10/21 1706 06/10/21 1720   06/10/21 0000  cephALEXin (KEFLEX) 500 MG capsule        500 mg Oral 2 times daily 06/10/21 1555     06/09/21 0000  cefTRIAXone (ROCEPHIN) 1 g in sodium chloride 0.9 % 100 mL IVPB  Status:  Discontinued        1 g 200 mL/hr over 30 Minutes  Intravenous Every 24 hours 06/07/21 2353 06/11/21 0722   06/07/21 2315  cefTRIAXone (ROCEPHIN) 1 g in sodium chloride 0.9 % 100 mL IVPB        1 g 200 mL/hr over 30 Minutes Intravenous  Once 06/07/21 2306 06/08/21 0054        Subjective: Patient seen and examined at bedside.  Patient is a poor historian.  No worsening shortness of breath, vomiting or fever reported.  Objective: Vitals:   06/15/21 1511 06/15/21 2123 06/16/21 0543 06/16/21 0642  BP:  (!)  150/55 (!) 145/56   Pulse:  64 (!) 58   Resp:  15 16   Temp:  97.7 F (36.5 C) 97.6 F (36.4 C)   TempSrc:      SpO2: 100% 100% 100%   Weight:    47.9 kg  Height:        Intake/Output Summary (Last 24 hours) at 06/16/2021 1111 Last data filed at 06/16/2021 0643 Gross per 24 hour  Intake 240 ml  Output 2215 ml  Net -1975 ml    Filed Weights   06/14/21 1325 06/16/21 0642  Weight: 51.7 kg 47.9 kg    Examination:  General exam: On room air currently.  No acute distress.  Poor historian.  Slightly confused. Respiratory system: Decreased breath sounds at bases bilaterally cardiovascular system: Rate controlled, S1-S2 heard Gastrointestinal system: Abdomen is slightly distended, soft and nontender.  Bowel sounds are heard Extremities: No edema or clubbing  Data Reviewed: I have personally reviewed following labs and imaging studies  CBC: Recent Labs  Lab 06/10/21 0505 06/11/21 0345 06/14/21 0401 06/16/21 0344  WBC 3.1* 3.5* 4.4 5.5  NEUTROABS 1.4* 1.8  --  3.3  HGB 10.0* 11.0* 10.7* 10.8*  HCT 32.3* 35.2* 34.6* 34.4*  MCV 87.3 86.3 85.2 85.1  PLT 135* 146* 202 532    Basic Metabolic Panel: Recent Labs  Lab 06/11/21 0345 06/14/21 0401 06/16/21 0344  NA 140 135 139  K 3.6 3.5 4.5  CL 106 105 104  CO2 27 23 30   GLUCOSE 230* 248* 308*  BUN 19 29* 25*  CREATININE 0.97 1.01* 1.14*  CALCIUM 8.6* 8.9 9.2  MG  --   --  2.3    GFR: Estimated Creatinine Clearance: 25.8 mL/min (A) (by C-G formula based on SCr of 1.14 mg/dL (H)). Liver Function Tests: Recent Labs  Lab 06/11/21 0345 06/16/21 0344  AST 30 22  ALT 21 24  ALKPHOS 52 81  BILITOT 0.4 0.6  PROT 6.4* 5.8*  ALBUMIN 3.1* 3.0*    No results for input(s): LIPASE, AMYLASE in the last 168 hours. No results for input(s): AMMONIA in the last 168 hours. Coagulation Profile: No results for input(s): INR, PROTIME in the last 168 hours. Cardiac Enzymes: No results for input(s): CKTOTAL, CKMB, CKMBINDEX,  TROPONINI in the last 168 hours. BNP (last 3 results) No results for input(s): PROBNP in the last 8760 hours. HbA1C: No results for input(s): HGBA1C in the last 72 hours. CBG: No results for input(s): GLUCAP in the last 168 hours. Lipid Profile: No results for input(s): CHOL, HDL, LDLCALC, TRIG, CHOLHDL, LDLDIRECT in the last 72 hours. Thyroid Function Tests: No results for input(s): TSH, T4TOTAL, FREET4, T3FREE, THYROIDAB in the last 72 hours. Anemia Panel: No results for input(s): VITAMINB12, FOLATE, FERRITIN, TIBC, IRON, RETICCTPCT in the last 72 hours. Sepsis Labs: Recent Labs  Lab 06/10/21 1909  PROCALCITON <0.10     Recent Results (from the past 240 hour(s))  Urine  culture     Status: Abnormal   Collection Time: 06/07/21 10:02 PM   Specimen: Urine, Random  Result Value Ref Range Status   Specimen Description   Final    URINE, RANDOM Performed at [facility] [address], [place], [place] [address]    Special Requests   Final    NONE Performed at [facility], [place] [address] [place], [place] [address]    Culture (A)  Final    <10,000 COLONIES/mL INSIGNIFICANT GROWTH Performed at [facility] [address], [place], [place] [address]    Report Status 06/09/2021 FINAL  Final  Resp Panel by RT-PCR (Flu A&B, Covid) Nasopharyngeal Swab     Status: None   Collection Time: 06/07/21 11:19 PM   Specimen: Nasopharyngeal Swab; Nasopharyngeal(NP) swabs in vial transport medium  Result Value Ref Range Status   SARS Coronavirus 2 by RT PCR NEGATIVE NEGATIVE Final    Comment: (NOTE) SARS-CoV-2 target nucleic acids are NOT DETECTED.  The SARS-CoV-2 RNA is generally detectable in upper respiratory specimens during the acute phase of infection. The lowest concentration of SARS-CoV-2 viral copies this assay can detect is 138 copies/mL. A negative result does not preclude SARS-Cov-2 infection and should not be used as the sole  basis for treatment or other patient management decisions. A negative result may occur with  improper specimen collection/handling, submission of specimen other than nasopharyngeal swab, presence of viral mutation(s) within the areas targeted by this assay, and inadequate number of viral copies(<138 copies/mL). A negative result must be combined with clinical observations, patient history, and epidemiological information. The expected result is Negative.  Fact Sheet for Patients:  EntrepreneurPulse.com.au  Fact Sheet for Healthcare Providers:  IncredibleEmployment.be  This test is no t yet approved or cleared by the [place] FDA and  has been authorized for detection and/or diagnosis of SARS-CoV-2 by FDA under an Emergency Use Authorization (EUA). This EUA will remain  in effect (meaning this test can be used) for the duration of the COVID-19 declaration under Section 564(b)(1) of the Act, 21 U.S.C.section 360bbb-3(b)(1), unless the authorization is terminated  or revoked sooner.       Influenza A by PCR NEGATIVE NEGATIVE Final   Influenza B by PCR NEGATIVE NEGATIVE Final    Comment: (NOTE) The Xpert Xpress SARS-CoV-2/FLU/RSV plus assay is intended as an aid in the diagnosis of influenza from Nasopharyngeal swab specimens and should not be used as a sole basis for treatment. Nasal washings and aspirates are unacceptable for Xpert Xpress SARS-CoV-2/FLU/RSV testing.  Fact Sheet for Patients: EntrepreneurPulse.com.au  Fact Sheet for Healthcare Providers: IncredibleEmployment.be  This test is not yet approved or cleared by the [place] FDA and has been authorized for detection and/or diagnosis of SARS-CoV-2 by FDA under an Emergency Use Authorization (EUA). This EUA will remain in effect (meaning this test can be used) for the duration of the COVID-19 declaration under Section 564(b)(1) of the Act,  21 U.S.C. section 360bbb-3(b)(1), unless the authorization is terminated or revoked.  Performed at [facility], [place] [address], [place], [place] [address]   SARS CORONAVIRUS 2 (TAT 6-24 HRS) Nasopharyngeal Nasopharyngeal Swab     Status: Abnormal   Collection Time: 06/10/21 11:48 AM   Specimen: Nasopharyngeal Swab  Result Value Ref Range Status   SARS Coronavirus 2 POSITIVE (A) NEGATIVE Final    Comment: (NOTE) SARS-CoV-2 target nucleic acids are DETECTED.  The SARS-CoV-2 RNA is generally detectable in upper  and lower respiratory specimens during the acute phase of infection. Positive results are indicative of the presence of SARS-CoV-2 RNA. Clinical correlation with patient history and other diagnostic information is  necessary to determine patient infection status. Positive results do not rule out bacterial infection or co-infection with other viruses.  The expected result is Negative.  Fact Sheet for Patients: SugarRoll.be  Fact Sheet for Healthcare Providers: https://www.woods-mathews.com/  This test is not yet approved or cleared by the [place] FDA and  has been authorized for detection and/or diagnosis of SARS-CoV-2 by FDA under an Emergency Use Authorization (EUA). This EUA will remain  in effect (meaning this test can be used) for the duration of the COVID-19 declaration under Section 564(b)(1) of the Act, 21 U. S.C. section 360bbb-3(b)(1), unless the authorization is terminated or revoked sooner.   Performed at [facility], [place] [address], [place], [place] [address]   Resp Panel by RT-PCR (Flu A&B, Covid) Nasopharyngeal Swab     Status: Abnormal   Collection Time: 06/10/21  3:37 PM   Specimen: Nasopharyngeal Swab; Nasopharyngeal(NP) swabs in vial transport medium  Result Value Ref Range Status   SARS Coronavirus 2 by RT PCR POSITIVE (A) NEGATIVE Final    Comment: RESULT CALLED TO, READ BACK  BY AND VERIFIED WITH: Coolidge Breeze RN [id number] 06/10/21 A BROWNING (NOTE) SARS-CoV-2 target nucleic acids are DETECTED.  The SARS-CoV-2 RNA is generally detectable in upper respiratory specimens during the acute phase of infection. Positive results are indicative of the presence of the identified virus, but do not rule out bacterial infection or co-infection with other pathogens not detected by the test. Clinical correlation with patient history and other diagnostic information is necessary to determine patient infection status. The expected result is Negative.  Fact Sheet for Patients: EntrepreneurPulse.com.au  Fact Sheet for Healthcare Providers: IncredibleEmployment.be  This test is not yet approved or cleared by the [place] FDA and  has been authorized for detection and/or diagnosis of SARS-CoV-2 by FDA under an Emergency Use Authorization (EUA).  This EUA will remain in effect (meaning this test can b e used) for the duration of  the COVID-19 declaration under Section 564(b)(1) of the Act, 21 U.S.C. section 360bbb-3(b)(1), unless the authorization is terminated or revoked sooner.     Influenza A by PCR NEGATIVE NEGATIVE Final   Influenza B by PCR NEGATIVE NEGATIVE Final    Comment: (NOTE) The Xpert Xpress SARS-CoV-2/FLU/RSV plus assay is intended as an aid in the diagnosis of influenza from Nasopharyngeal swab specimens and should not be used as a sole basis for treatment. Nasal washings and aspirates are unacceptable for Xpert Xpress SARS-CoV-2/FLU/RSV testing.  Fact Sheet for Patients: EntrepreneurPulse.com.au  Fact Sheet for Healthcare Providers: IncredibleEmployment.be  This test is not yet approved or cleared by the [place] FDA and has been authorized for detection and/or diagnosis of SARS-CoV-2 by FDA under an Emergency Use Authorization (EUA). This EUA will remain in effect (meaning this  test can be used) for the duration of the COVID-19 declaration under Section 564(b)(1) of the Act, 21 U.S.C. section 360bbb-3(b)(1), unless the authorization is terminated or revoked.  Performed at [facility], [place] [address] [place], [place] [address]           Radiology Studies: No results found.      Scheduled Meds:  amLODipine  10 mg Oral Daily   vitamin C  500 mg Oral Daily   aspirin EC  81 mg Oral Daily  atorvastatin  40 mg Oral q1800   brimonidine  1 drop Left Eye BID   enoxaparin (LOVENOX) injection  40 mg Subcutaneous Q24H   furosemide  40 mg Oral Daily   gabapentin  400 mg Oral BID   ipratropium  2 spray Each Nare BID   latanoprost  1 drop Left Eye QHS   lisinopril  20 mg Oral Daily   metoprolol tartrate  12.5 mg Oral BID   potassium chloride  10 mEq Oral Daily   Vitamin D (Ergocalciferol)  50,000 Units Oral Weekly   zinc sulfate  220 mg Oral Daily   Continuous Infusions:        Aline August, MD [facility] 06/16/2021, 11:11 AM

## 2021-06-16 NOTE — Care Management Important Message (Signed)
Important Message  Patient Details IM Letter placed in [employer]. Name: Erin Carr MRN: [id number] Date of Birth: 24-Dec-1929   Medicare Important Message Given:  Yes     [name] 06/16/2021, 2:12 PM

## 2021-06-17 DIAGNOSIS — U071 COVID-19: Secondary | ICD-10-CM

## 2021-06-17 DIAGNOSIS — I5022 Chronic systolic (congestive) heart failure: Secondary | ICD-10-CM | POA: Diagnosis not present

## 2021-06-17 DIAGNOSIS — N3000 Acute cystitis without hematuria: Secondary | ICD-10-CM | POA: Diagnosis not present

## 2021-06-17 DIAGNOSIS — N179 Acute kidney failure, unspecified: Secondary | ICD-10-CM | POA: Diagnosis not present

## 2021-06-17 LAB — COMPREHENSIVE METABOLIC PANEL
ALT: 19 U/L (ref 0–44)
AST: 19 U/L (ref 15–41)
Albumin: 2.7 g/dL — ABNORMAL LOW (ref 3.5–5.0)
Alkaline Phosphatase: 76 U/L (ref 38–126)
Anion gap: 7 (ref 5–15)
BUN: 25 mg/dL — ABNORMAL HIGH (ref 8–23)
CO2: 28 mmol/L (ref 22–32)
Calcium: 9 mg/dL (ref 8.9–10.3)
Chloride: 101 mmol/L (ref 98–111)
Creatinine, Ser: 1.14 mg/dL — ABNORMAL HIGH (ref 0.44–1.00)
GFR, Estimated: 46 mL/min — ABNORMAL LOW (ref >60)
Glucose, Bld: 227 mg/dL — ABNORMAL HIGH (ref 70–99)
Potassium: 4.3 mmol/L (ref 3.5–5.1)
Sodium: 136 mmol/L (ref 135–145)
Total Bilirubin: 0.6 mg/dL (ref 0.3–1.2)
Total Protein: 5.5 g/dL — ABNORMAL LOW (ref 6.5–8.1)

## 2021-06-17 LAB — GLUCOSE, CAPILLARY
Glucose-Capillary: 229 mg/dL — ABNORMAL HIGH (ref 70–99)
Glucose-Capillary: 251 mg/dL — ABNORMAL HIGH (ref 70–99)
Glucose-Capillary: 197 mg/dL — ABNORMAL HIGH (ref 70–99)

## 2021-06-17 LAB — C-REACTIVE PROTEIN: CRP: 0.5 mg/dL (ref <1.0)

## 2021-06-17 LAB — D-DIMER, QUANTITATIVE: D-Dimer, Quant: 0.48 ug/mL-FEU (ref 0.00–0.50)

## 2021-06-17 MED ORDER — INSULIN ASPART 100 UNIT/ML IJ SOLN: 0.0000 [IU] | Freq: Every day | INTRAMUSCULAR | Status: DC

## 2021-06-17 MED ORDER — INSULIN ASPART 100 UNIT/ML IJ SOLN
0.0000 [IU] | Freq: Three times a day (TID) | INTRAMUSCULAR | Status: DC
  Administered 2021-06-17: 5 [IU] via SUBCUTANEOUS
  Administered 2021-06-17: 3 [IU] via SUBCUTANEOUS
  Administered 2021-06-18: 2 [IU] via SUBCUTANEOUS
  Administered 2021-06-18: 5 [IU] via SUBCUTANEOUS
  Administered 2021-06-18 – 2021-06-19 (×3): 2 [IU] via SUBCUTANEOUS
  Administered 2021-06-19: 5 [IU] via SUBCUTANEOUS
  Administered 2021-06-20: 3 [IU] via SUBCUTANEOUS

## 2021-06-17 NOTE — Progress Notes (Signed)
Patient ID: Erin Carr, female   DOB: 07-16-1930, 87 y.o.   MRN: [id number]  PROGRESS NOTE    Erin Carr  CBJ:[id number] DOB: 1930-03-10 DOA: 06/07/2021 PCP: Vincente Liberty, MD   Brief Narrative:  87 year old female with history of unspecified CVA, right foot drop, non-STEMI, chronic diastolic CHF, NSCLC in remission status post radiotherapy, hypertension, diabetes mellitus type 2, asthma and hyperlipidemia presented with bilateral leg weakness along with urinary incontinence.  CT of the head showed no acute stroke, creatinine elevated at 1.46 and a UA had significant pyuria.  She was started on IV fluids and IV Rocephin.  PT recommended SNF placement.  Patient subsequently developed fever and congestion and tested positive for COVID-19.  No evidence of pneumonia.  Treated with 3 doses of IV remdesivir.  Currently medically stable for discharge; SNF rehab complicated due to COVID-positive status.  Assessment & Plan:   Lower extremity weakness -Possibly infection related worsening of general functional decline. -Improving.  PT recommended SNF placement.  CT of the head was negative for acute abnormality.  UTI -Insignificant growth on culture.  Treated with Rocephin and subsequently discontinued  COVID-19 infection -No evidence of pneumonia.  CRP not significantly elevated.  Patient is vaccinated x2 -Status post 3 doses of remdesivir from 06/10/2021-06/12/2021.  Isolate for 10 days -Respiratory status currently stable  Stage IIIb CKD -Creatinine currently stable  Thrombocytopenia -Resolved  Leukopenia--resolved  Chronic diastolic CHF -Euvolemic.  Continue Lasix, lisinopril and metoprolol.  Strict input and output.  Daily weights.  Fluid restriction  History of unspecified CVA -Continue aspirin and statin  Diabetes mellitus type 2 with hyperglycemia -CBGs with SSI  Hyperlipidemia -Continue statin  History of NSCLC -Outpatient follow-up with oncology  Generalized  deconditioning -Palliative care consultation for goals of care discussion is pending   DVT prophylaxis: Lovenox Code Status: Full Family Communication: None at bedside Disposition Plan: Status is: Inpatient  Remains inpatient appropriate because:Inpatient level of care appropriate due to severity of illness  Dispo: The patient is from: Home              Anticipated d/c is to: SNF              Patient currently is medically stable to d/c.   Difficult to place patient Yes   Consultants: Palliative care consultation is pending  Procedures: None  Antimicrobials:  Anti-infectives (From admission, onward)    Start     Dose/Rate Route Frequency Ordered Stop   06/11/21 1000  remdesivir 100 mg in sodium chloride 0.9 % 100 mL IVPB       See Hyperspace for full Linked Orders Report.   100 mg 200 mL/hr over 30 Minutes Intravenous Daily 06/10/21 1725 06/12/21 0926   06/10/21 1900  remdesivir 200 mg in sodium chloride 0.9% 250 mL IVPB       See Hyperspace for full Linked Orders Report.   200 mg 580 mL/hr over 30 Minutes Intravenous Once 06/10/21 1725 06/10/21 2128   06/10/21 1800  nirmatrelvir/ritonavir EUA (renal dosing) (PAXLOVID) TABS 2 tablet  Status:  Discontinued        2 tablet Oral 2 times daily 06/10/21 1706 06/10/21 1720   06/10/21 0000  cephALEXin (KEFLEX) 500 MG capsule        500 mg Oral 2 times daily 06/10/21 1555     06/09/21 0000  cefTRIAXone (ROCEPHIN) 1 g in sodium chloride 0.9 % 100 mL IVPB  Status:  Discontinued        1  g 200 mL/hr over 30 Minutes Intravenous Every 24 hours 06/07/21 2353 06/11/21 0722   06/07/21 2315  cefTRIAXone (ROCEPHIN) 1 g in sodium chloride 0.9 % 100 mL IVPB        1 g 200 mL/hr over 30 Minutes Intravenous  Once 06/07/21 2306 06/08/21 0054        Subjective: Patient seen and examined at bedside.  Patient is a poor historian.  No overnight fever, chest pain, worsening shortness of breath reported. Objective: Vitals:   06/16/21 0642  06/16/21 1232 06/16/21 2105 06/17/21 0658  BP:  133/63 (!) 134/57 124/61  Pulse:  (!) 56 65 (!) 56  Resp:  (!) 22 18 20   Temp:  97.9 F (36.6 C) 97.8 F (36.6 C) 98.2 F (36.8 C)  TempSrc:   Oral Oral  SpO2:  100% 100% 100%  Weight: 47.9 kg     Height:        Intake/Output Summary (Last 24 hours) at 06/17/2021 0757 Last data filed at 06/17/2021 0645 Gross per 24 hour  Intake 240 ml  Output 900 ml  Net -660 ml    Filed Weights   06/14/21 1325 06/16/21 0642  Weight: 51.7 kg 47.9 kg    Examination:  General exam: No distress.  Currently on room air.  Poor historian.  Still slightly confused. Respiratory system: Bilateral decreased breath sounds at bases  cardiovascular system: S1-S2 heard; intermittently bradycardic Gastrointestinal system: Abdomen is distended mildly, soft and nontender.  Normal bowel sounds heard Extremities: No clubbing or cyanosis  Data Reviewed: I have personally reviewed following labs and imaging studies  CBC: Recent Labs  Lab 06/11/21 0345 06/14/21 0401 06/16/21 0344  WBC 3.5* 4.4 5.5  NEUTROABS 1.8  --  3.3  HGB 11.0* 10.7* 10.8*  HCT 35.2* 34.6* 34.4*  MCV 86.3 85.2 85.1  PLT 146* 202 009    Basic Metabolic Panel: Recent Labs  Lab 06/11/21 0345 06/14/21 0401 06/16/21 0344 06/17/21 0353  NA 140 135 139 136  K 3.6 3.5 4.5 4.3  CL 106 105 104 101  CO2 27 23 30 28   GLUCOSE 230* 248* 308* 227*  BUN 19 29* 25* 25*  CREATININE 0.97 1.01* 1.14* 1.14*  CALCIUM 8.6* 8.9 9.2 9.0  MG  --   --  2.3  --     GFR: Estimated Creatinine Clearance: 25.8 mL/min (A) (by C-G formula based on SCr of 1.14 mg/dL (H)). Liver Function Tests: Recent Labs  Lab 06/11/21 0345 06/16/21 0344 06/17/21 0353  AST 30 22 19   ALT 21 24 19   ALKPHOS 52 81 76  BILITOT 0.4 0.6 0.6  PROT 6.4* 5.8* 5.5*  ALBUMIN 3.1* 3.0* 2.7*    No results for input(s): LIPASE, AMYLASE in the last 168 hours. No results for input(s): AMMONIA in the last 168  hours. Coagulation Profile: No results for input(s): INR, PROTIME in the last 168 hours. Cardiac Enzymes: No results for input(s): CKTOTAL, CKMB, CKMBINDEX, TROPONINI in the last 168 hours. BNP (last 3 results) No results for input(s): PROBNP in the last 8760 hours. HbA1C: No results for input(s): HGBA1C in the last 72 hours. CBG: No results for input(s): GLUCAP in the last 168 hours. Lipid Profile: No results for input(s): CHOL, HDL, LDLCALC, TRIG, CHOLHDL, LDLDIRECT in the last 72 hours. Thyroid Function Tests: No results for input(s): TSH, T4TOTAL, FREET4, T3FREE, THYROIDAB in the last 72 hours. Anemia Panel: No results for input(s): VITAMINB12, FOLATE, FERRITIN, TIBC, IRON, RETICCTPCT in the last 72 hours.  Sepsis Labs: Recent Labs  Lab 06/10/21 1909  PROCALCITON <0.10     Recent Results (from the past 240 hour(s))  Urine culture     Status: Abnormal   Collection Time: 06/07/21 10:02 PM   Specimen: Urine, Random  Result Value Ref Range Status   Specimen Description   Final    URINE, RANDOM Performed at [facility] [address] [place], [place] [address]    Special Requests   Final    NONE Performed at [facility], [place] [address] [place], [place] [address]    Culture (A)  Final    <10,000 COLONIES/mL INSIGNIFICANT GROWTH Performed at [facility] [address] [place],  [address]    Report Status 06/09/2021 FINAL  Final  Resp Panel by RT-PCR (Flu A&B, Covid) Nasopharyngeal Swab     Status: None   Collection Time: 06/07/21 11:19 PM   Specimen: Nasopharyngeal Swab; Nasopharyngeal(NP) swabs in vial transport medium  Result Value Ref Range Status   SARS Coronavirus 2 by RT PCR NEGATIVE NEGATIVE Final    Comment: (NOTE) SARS-CoV-2 target nucleic acids are NOT DETECTED.  The SARS-CoV-2 RNA is generally detectable in upper respiratory specimens during the acute phase of infection. The lowest concentration of  SARS-CoV-2 viral copies this assay can detect is 138 copies/mL. A negative result does not preclude SARS-Cov-2 infection and should not be used as the sole basis for treatment or other patient management decisions. A negative result may occur with  improper specimen collection/handling, submission of specimen other than nasopharyngeal swab, presence of viral mutation(s) within the areas targeted by this assay, and inadequate number of viral copies(<138 copies/mL). A negative result must be combined with clinical observations, patient history, and epidemiological information. The expected result is Negative.  Fact Sheet for Patients:  EntrepreneurPulse.com.au  Fact Sheet for Healthcare Providers:  IncredibleEmployment.be  This test is no t yet approved or cleared by the [place] FDA and  has been authorized for detection and/or diagnosis of SARS-CoV-2 by FDA under an Emergency Use Authorization (EUA). This EUA will remain  in effect (meaning this test can be used) for the duration of the COVID-19 declaration under Section 564(b)(1) of the Act, 21 U.S.C.section 360bbb-3(b)(1), unless the authorization is terminated  or revoked sooner.       Influenza A by PCR NEGATIVE NEGATIVE Final   Influenza B by PCR NEGATIVE NEGATIVE Final    Comment: (NOTE) The Xpert Xpress SARS-CoV-2/FLU/RSV plus assay is intended as an aid in the diagnosis of influenza from Nasopharyngeal swab specimens and should not be used as a sole basis for treatment. Nasal washings and aspirates are unacceptable for Xpert Xpress SARS-CoV-2/FLU/RSV testing.  Fact Sheet for Patients: EntrepreneurPulse.com.au  Fact Sheet for Healthcare Providers: IncredibleEmployment.be  This test is not yet approved or cleared by the [place] FDA and has been authorized for detection and/or diagnosis of SARS-CoV-2 by FDA under an Emergency Use  Authorization (EUA). This EUA will remain in effect (meaning this test can be used) for the duration of the COVID-19 declaration under Section 564(b)(1) of the Act, 21 U.S.C. section 360bbb-3(b)(1), unless the authorization is terminated or revoked.  Performed at [facility], [place] [address] [place], [place] [address]   SARS CORONAVIRUS 2 (TAT 6-24 HRS) Nasopharyngeal Nasopharyngeal Swab     Status: Abnormal   Collection Time: 06/10/21 11:48 AM   Specimen: Nasopharyngeal Swab  Result Value Ref Range Status   SARS Coronavirus 2  POSITIVE (A) NEGATIVE Final    Comment: (NOTE) SARS-CoV-2 target nucleic acids are DETECTED.  The SARS-CoV-2 RNA is generally detectable in upper and lower respiratory specimens during the acute phase of infection. Positive results are indicative of the presence of SARS-CoV-2 RNA. Clinical correlation with patient history and other diagnostic information is  necessary to determine patient infection status. Positive results do not rule out bacterial infection or co-infection with other viruses.  The expected result is Negative.  Fact Sheet for Patients: SugarRoll.be  Fact Sheet for Healthcare Providers: https://www.woods-mathews.com/  This test is not yet approved or cleared by the [place] FDA and  has been authorized for detection and/or diagnosis of SARS-CoV-2 by FDA under an Emergency Use Authorization (EUA). This EUA will remain  in effect (meaning this test can be used) for the duration of the COVID-19 declaration under Section 564(b)(1) of the Act, 21 U. S.C. section 360bbb-3(b)(1), unless the authorization is terminated or revoked sooner.   Performed at [facility], [place] [address], [place], [place] [address]   Resp Panel by RT-PCR (Flu A&B, Covid) Nasopharyngeal Swab     Status: Abnormal   Collection Time: 06/10/21  3:37 PM   Specimen: Nasopharyngeal Swab;  Nasopharyngeal(NP) swabs in vial transport medium  Result Value Ref Range Status   SARS Coronavirus 2 by RT PCR POSITIVE (A) NEGATIVE Final    Comment: RESULT CALLED TO, READ BACK BY AND VERIFIED WITH: Coolidge Breeze RN [id number] 06/10/21 A BROWNING (NOTE) SARS-CoV-2 target nucleic acids are DETECTED.  The SARS-CoV-2 RNA is generally detectable in upper respiratory specimens during the acute phase of infection. Positive results are indicative of the presence of the identified virus, but do not rule out bacterial infection or co-infection with other pathogens not detected by the test. Clinical correlation with patient history and other diagnostic information is necessary to determine patient infection status. The expected result is Negative.  Fact Sheet for Patients: EntrepreneurPulse.com.au  Fact Sheet for Healthcare Providers: IncredibleEmployment.be  This test is not yet approved or cleared by the [place] FDA and  has been authorized for detection and/or diagnosis of SARS-CoV-2 by FDA under an Emergency Use Authorization (EUA).  This EUA will remain in effect (meaning this test can b e used) for the duration of  the COVID-19 declaration under Section 564(b)(1) of the Act, 21 U.S.C. section 360bbb-3(b)(1), unless the authorization is terminated or revoked sooner.     Influenza A by PCR NEGATIVE NEGATIVE Final   Influenza B by PCR NEGATIVE NEGATIVE Final    Comment: (NOTE) The Xpert Xpress SARS-CoV-2/FLU/RSV plus assay is intended as an aid in the diagnosis of influenza from Nasopharyngeal swab specimens and should not be used as a sole basis for treatment. Nasal washings and aspirates are unacceptable for Xpert Xpress SARS-CoV-2/FLU/RSV testing.  Fact Sheet for Patients: EntrepreneurPulse.com.au  Fact Sheet for Healthcare Providers: IncredibleEmployment.be  This test is not yet approved or cleared by the  [place] FDA and has been authorized for detection and/or diagnosis of SARS-CoV-2 by FDA under an Emergency Use Authorization (EUA). This EUA will remain in effect (meaning this test can be used) for the duration of the COVID-19 declaration under Section 564(b)(1) of the Act, 21 U.S.C. section 360bbb-3(b)(1), unless the authorization is terminated or revoked.  Performed at [facility], [place] [address], [place], [place] [address]           Radiology Studies: No results found.      Scheduled Meds:  amLODipine  10 mg Oral Daily   vitamin C  500 mg Oral Daily   aspirin EC  81 mg Oral Daily   atorvastatin  40 mg Oral q1800   brimonidine  1 drop Left Eye BID   enoxaparin (LOVENOX) injection  30 mg Subcutaneous Q24H   furosemide  40 mg Oral Daily   gabapentin  400 mg Oral BID   ipratropium  2 spray Each Nare BID   latanoprost  1 drop Left Eye QHS   lisinopril  20 mg Oral Daily   metoprolol tartrate  12.5 mg Oral BID   potassium chloride  10 mEq Oral Daily   Vitamin D (Ergocalciferol)  50,000 Units Oral Weekly   zinc sulfate  220 mg Oral Daily   Continuous Infusions:        Aline August, MD [facility] 06/17/2021, 7:57 AM

## 2021-06-17 NOTE — Consult Note (Signed)
Consultation Note Date: 06/17/2021   Patient Name: Erin Carr  DOB: Jan 17, 1930  MRN: [id number]  Age / Sex: 87 y.o., female  PCP: Vincente Liberty, MD Referring Physician: Aline August, MD  Reason for Consultation: Establishing goals of care  HPI/Patient Profile: 87 y.o. female  with past medical history of CVA, NSTEMI, CHF, NSCLC, HTN, DM, asthma, HLP admitted on 06/07/2021 with weakness and incontinence.  She was treated for UTI with plan for transition to skilled facility for rehab but she was subsequently found to be COVID-positive.  Palliative consulted for goals of care.  Clinical Assessment and Goals of Care: I met today with Erin Carr.   I introduced palliative care as specialized medical care for people living with serious illness. It focuses on providing relief from the symptoms and stress of a serious illness. The goal is to improve quality of life for both the patient and the family.  We discussed clinical course as well as wishes moving forward in regard to advanced directives.  Concepts specific to code status and rehospitalization discussed.  We discussed difference between a aggressive medical intervention path and more of a palliative, comfort focused care path.  Values and goals of care important to patient and family were attempted to be elicited.  We discussed that she lives alone but has a niece who lives next door to her.  She also has other niece that family would like to help her when she needs assistance.  She had previously lived with her twin sister, but chart review notes that her sister is currently on long-term care with hospice support.   Concept of Hospice and Palliative Care were discussed.   Questions and concerns addressed.   PMT will continue to support holistically.   SUMMARY OF RECOMMENDATIONS   -Full code/full scope: She tells me today that she may not actually  desire attempts at resuscitation in the event of cardiac or respiratory arrest.  She is going to discuss this further with her family. -She would like to see how she does with a trial of rehab.  She may be open to more of a comfort based approach if she is not able to make functional gains with attempts at rehab. -I would recommend completion of a MOST form prior to discharge.  I reviewed this with her today.  She would like some time to consider.  I will plan to follow-up again sometime this weekend.  Code Status/Advance Care Planning: Full code  Palliative Prophylaxis:  Bowel Regimen and Frequent Pain Assessment  Additional Recommendations (Limitations, Scope, Preferences): Full Scope Treatment  Psycho-social/Spiritual:  Desire for further Chaplaincy support:no Additional Recommendations: Caregiving  Support/Resources  Prognosis:  > 12 months  Discharge Planning: [place] for rehab with Palliative care service follow-up      Primary Diagnoses: Present on Admission:  UTI (urinary tract infection)  Hypertensive disorder   I have reviewed the medical record, interviewed the patient and family, and examined the patient. The following aspects are pertinent.  Past Medical History:  Diagnosis Date   Adenocarcinoma of right lung, stage 1 ([place]) 11/16/2016   Arthritis    Asthma    COPD (chronic obstructive pulmonary disease) (HCC)    Diabetes mellitus without complication (HCC)    Type II   Family history of adverse reaction to anesthesia    "aunt had PONV"   History of radiation therapy 12/19/16-12/27/16   SBRT to right upper lobe 54 Gy in 3 fractions   Hypercholesteremia    Hypertension    Osteoporosis    Positive PPD 12/16   PER PCP   Shortness of breath dyspnea    Tremors of nervous system    Left hand   Urinary frequency    Vitamin D deficiency    Wears glasses    Social History   Socioeconomic History   Marital status: Single    Spouse name: Not  on file   Number of children: Not on file   Years of education: Not on file   Highest education level: Not on file  Occupational History   Not on file  Tobacco Use   Smoking status: Former    Packs/day: 0.50    Years: 40.00    Pack years: 20.00    Types: Cigarettes    Quit date: 12/08/2001    Years since quitting: 19.5   Smokeless tobacco: Never   Tobacco comments:    quit about 15-16 years ago; 12/09/2015  Substance and Sexual Activity   Alcohol use: No   Drug use: No   Sexual activity: Not on file  Other Topics Concern   Not on file  Social History Narrative   Not on file   Social Determinants of Health   Financial Resource Strain: Not on file  Food Insecurity: Not on file  Transportation Needs: Not on file  Physical Activity: Not on file  Stress: Not on file  Social Connections: Not on file   Family History  Problem Relation Age of Onset   Hypertension Father    Diabetes Father    Scheduled Meds:  amLODipine  10 mg Oral Daily   vitamin C  500 mg Oral Daily   aspirin EC  81 mg Oral Daily   atorvastatin  40 mg Oral q1800   brimonidine  1 drop Left Eye BID   enoxaparin (LOVENOX) injection  30 mg Subcutaneous Q24H   furosemide  40 mg Oral Daily   gabapentin  400 mg Oral BID   ipratropium  2 spray Each Nare BID   latanoprost  1 drop Left Eye QHS   lisinopril  20 mg Oral Daily   metoprolol tartrate  12.5 mg Oral BID   potassium chloride  10 mEq Oral Daily   Vitamin D (Ergocalciferol)  50,000 Units Oral Weekly   zinc sulfate  220 mg Oral Daily   Continuous Infusions: PRN Meds:.acetaminophen, guaiFENesin-dextromethorphan Medications Prior to Admission:  Prior to Admission medications   Medication Sig Start Date End Date Taking? Authorizing Provider  albuterol (PROVENTIL HFA;VENTOLIN HFA) 108 (90 Base) MCG/ACT inhaler Inhale 1 puff into the lungs every 4 (four) hours as needed for wheezing or shortness of breath.   Yes [provider]  amLODipine  (NORVASC) 10 MG tablet Take 10 mg by mouth daily. 10/12/20  Yes [provider]  aspirin EC 81 MG tablet Take 81 mg by mouth daily.   Yes [provider]  atorvastatin (LIPITOR) 40 MG tablet Take 1 tablet (40 mg total) by mouth daily at 6 PM. 10/25/17  Yes Danford, Suann Larry, MD  brimonidine (ALPHAGAN) 0.2 % ophthalmic solution Place 1 drop into the left eye 2 (two) times daily.   Yes [provider]  cephALEXin (KEFLEX) 500 MG capsule Take 1 capsule (500 mg total) by mouth 2 (two) times daily. 06/10/21  Yes Patrecia Pour, MD  ergocalciferol (VITAMIN D2) 50000 units capsule Take 50,000 Units by mouth once a week. On Monday   Yes [provider]  latanoprost (XALATAN) 0.005 % ophthalmic solution Place 1 drop into the left eye at bedtime. 10/16/20  Yes [provider]  lisinopril (PRINIVIL,ZESTRIL) 20 MG tablet Take 20 mg by mouth 2 (two) times daily.   Yes [provider]  metFORMIN (GLUCOPHAGE-XR) 500 MG 24 hr tablet Take 500 mg by mouth once. 06/20/17  Yes [provider]  metoprolol tartrate (LOPRESSOR) 25 MG tablet Take 0.5 tablets (12.5 mg total) by mouth 2 (two) times daily. Patient taking differently: Take 25 mg by mouth 2 (two) times daily. 10/25/17  Yes Danford, Suann Larry, MD  potassium chloride (K-DUR) 10 MEQ tablet Take 10 mEq by mouth daily.   Yes [provider]  furosemide (LASIX) 20 MG tablet Take 2 tablets (40 mg total) by mouth 2 (two) times daily. 06/10/21   Patrecia Pour, MD  gabapentin (NEURONTIN) 100 MG capsule Take 2 capsules (200 mg total) by mouth 2 (two) times daily. 06/10/21   Patrecia Pour, MD   No Known Allergies Review of Systems  Constitutional:  Positive for fatigue.  Neurological:  Positive for weakness.  Psychiatric/Behavioral:  Positive for sleep disturbance.    Physical Exam General: Alert, awake, in no acute distress.   HEENT: No bruits, no goiter, no JVD Heart: Regular rate and rhythm.  No murmur appreciated. Lungs: Good air movement, clear Abdomen: Soft, nontender, nondistended, positive bowel sounds.   Ext: No significant edema Skin: Warm and dry  Vital Signs: BP 124/61   Pulse (!) 56   Temp 98.2 F (36.8 C) (Oral)   Resp 20   Ht '5\' 2"'  (1.575 m)   Wt 47.9 kg   SpO2 100%   BMI 19.31 kg/m  Pain Scale: 0-10 POSS *See Group Information*: 1-Acceptable,Awake and alert Pain Score: 0-No pain   SpO2: SpO2: 100 % O2 Device:SpO2: 100 % O2 Flow Rate: .O2 Flow Rate (L/min): 0 L/min  IO: Intake/output summary:  Intake/Output Summary (Last 24 hours) at 06/17/2021 0758 Last data filed at 06/17/2021 0645 Gross per 24 hour  Intake 240 ml  Output 900 ml  Net -660 ml    LBM: Last BM Date: 06/15/21 Baseline Weight: Weight: 51.7 kg Most recent weight: Weight: 47.9 kg     Palliative Assessment/Data:   Flowsheet Rows    Flowsheet Row Most Recent Value  Intake Tab   Referral Department Hospitalist  Unit at Time of Referral Med/Surg Unit  Palliative Care Primary Diagnosis Sepsis/Infectious Disease  Date Notified 06/15/21  Palliative Care Type New Palliative care  Reason for referral Clarify Goals of Care  Date of Admission 06/07/21  Date first seen by Palliative Care 06/16/21  # of days Palliative referral response time 1 Day(s)  # of days IP prior to Palliative referral 8  Clinical Assessment   Palliative Performance Scale Score 50%  Psychosocial & Spiritual Assessment   Palliative Care Outcomes   Patient/Family meeting held? Yes  Who was at the meeting? Patient       Time In: 1700 Time Out: 1825 Time Total: 75 Greater than  50%  of this time was spent counseling and coordinating care related to the above assessment and plan.  Signed by: Micheline Rough, MD   Please contact Palliative Medicine Team phone at [phone] for questions and concerns.  For individual provider: See Shea Evans

## 2021-06-17 NOTE — Progress Notes (Signed)
Physical Therapy Treatment Patient Details Name: Erin Carr MRN: [id number] DOB: 1930-08-25 Today's Date: 06/17/2021    History of Present Illness Erin Carr is a 87 y.o. female with medical history significant for CVA with right LE foot drop, CHF, NSTEMI, NSCLC of the right lung s/p radiotherapy in remission, hypertension, asthma, type 2 diabetes, anemia and hyperlipidemia who presents with new onset lower extremity weakness suspect secondary to UTI    PT Comments    Pt assisted to bathroom and then ambulated short distance around room.  Pt anticipates d/c to SNF to improve strength and independence prior to returning home.  Pt pleasant and cooperative and left in recliner awaiting lunch tray.    Follow Up Recommendations  SNF     Equipment Recommendations  None recommended by PT    Recommendations for Other Services       Precautions / Restrictions Precautions Precautions: Fall Precaution Comments: wears depends Required Braces or Orthoses: Other Brace Other Brace: right AFO    Mobility  Bed Mobility               General bed mobility comments: pt in recliner on arrival    Transfers Overall transfer level: Needs assistance Equipment used: Rolling walker (2 wheeled) Transfers: Sit to/from Stand Sit to Stand: Min guard         General transfer comment: min/guard for safety  Ambulation/Gait Ambulation/Gait assistance: Min guard Gait Distance (Feet): 60 Feet Assistive device: Rolling walker (2 wheeled) Gait Pattern/deviations: Step-through pattern;Trunk flexed Gait velocity: decr   General Gait Details: verbal cues for posture, pt ambulated to bathroom and then a couple times around room   Stairs             Wheelchair Mobility    Modified Rankin (Stroke Patients Only)       Balance Overall balance assessment: History of Falls;Needs assistance         Standing balance support: During functional activity;Bilateral upper  extremity supported Standing balance-Leahy Scale: Poor Standing balance comment: reliant on UE's                            Cognition Arousal/Alertness: Awake/alert Behavior During Therapy: WFL for tasks assessed/performed Overall Cognitive Status: Within Functional Limits for tasks assessed                                        Exercises      General Comments        Pertinent Vitals/Pain Pain Assessment: No/denies pain    Home Living                      Prior Function            PT Goals (current goals can now be found in the care plan section) Progress towards PT goals: Progressing toward goals    Frequency    Min 2X/week      PT Plan Current plan remains appropriate    Co-evaluation              AM-PAC PT "6 Clicks" Mobility   Outcome Measure  Help needed turning from your back to your side while in a flat bed without using bedrails?: A Little Help needed moving from lying on your back to sitting on the side of a flat bed without  using bedrails?: A Little Help needed moving to and from a bed to a chair (including a wheelchair)?: A Little Help needed standing up from a chair using your arms (e.g., wheelchair or bedside chair)?: A Little Help needed to walk in hospital room?: A Little Help needed climbing 3-5 steps with a railing? : A Lot 6 Click Score: 17    End of Session Equipment Utilized During Treatment: Gait belt Activity Tolerance: Patient tolerated treatment well Patient left: in chair;with call bell/phone within reach;with chair alarm set   PT Visit Diagnosis: Unsteadiness on feet (R26.81);Difficulty in walking, not elsewhere classified (R26.2)     Time: 1281-1886 PT Time Calculation (min) (ACUTE ONLY): 13 min  Charges:  $Gait Training: 8-22 mins                    Jannette Spanner PT, DPT Acute Rehabilitation Services Pager: [phone] Office: [phone]    York Ram E 06/17/2021, 1:25  PM

## 2021-06-18 DIAGNOSIS — U071 COVID-19: Secondary | ICD-10-CM | POA: Diagnosis not present

## 2021-06-18 LAB — COMPREHENSIVE METABOLIC PANEL
ALT: 20 U/L (ref 0–44)
AST: 19 U/L (ref 15–41)
Albumin: 2.8 g/dL — ABNORMAL LOW (ref 3.5–5.0)
Alkaline Phosphatase: 77 U/L (ref 38–126)
Anion gap: 5 (ref 5–15)
BUN: 31 mg/dL — ABNORMAL HIGH (ref 8–23)
CO2: 29 mmol/L (ref 22–32)
Calcium: 9.1 mg/dL (ref 8.9–10.3)
Chloride: 102 mmol/L (ref 98–111)
Creatinine, Ser: 1.42 mg/dL — ABNORMAL HIGH (ref 0.44–1.00)
GFR, Estimated: 36 mL/min — ABNORMAL LOW (ref >60)
Glucose, Bld: 185 mg/dL — ABNORMAL HIGH (ref 70–99)
Potassium: 4.6 mmol/L (ref 3.5–5.1)
Sodium: 136 mmol/L (ref 135–145)
Total Bilirubin: 0.4 mg/dL (ref 0.3–1.2)
Total Protein: 5.7 g/dL — ABNORMAL LOW (ref 6.5–8.1)

## 2021-06-18 LAB — C-REACTIVE PROTEIN: CRP: 0.6 mg/dL (ref <1.0)

## 2021-06-18 LAB — GLUCOSE, CAPILLARY
Glucose-Capillary: 160 mg/dL — ABNORMAL HIGH (ref 70–99)
Glucose-Capillary: 300 mg/dL — ABNORMAL HIGH (ref 70–99)
Glucose-Capillary: 155 mg/dL — ABNORMAL HIGH (ref 70–99)
Glucose-Capillary: 131 mg/dL — ABNORMAL HIGH (ref 70–99)

## 2021-06-18 LAB — HEMOGLOBIN A1C
Hgb A1c MFr Bld: 8.2 % — ABNORMAL HIGH (ref 4.8–5.6)
Mean Plasma Glucose: 188.64 mg/dL

## 2021-06-18 LAB — D-DIMER, QUANTITATIVE: D-Dimer, Quant: 0.61 ug/mL-FEU — ABNORMAL HIGH (ref 0.00–0.50)

## 2021-06-18 MED ORDER — POLYETHYLENE GLYCOL 3350 17 G PO PACK
17.0000 g | PACK | Freq: Every day | ORAL | Status: DC | PRN
  Administered 2021-06-18 – 2021-06-19 (×2): 17 g via ORAL
  Filled 2021-06-18 (×2): qty 1

## 2021-06-18 MED ORDER — SENNOSIDES-DOCUSATE SODIUM 8.6-50 MG PO TABS
1.0000 | ORAL_TABLET | Freq: Two times a day (BID) | ORAL | Status: DC
  Administered 2021-06-18 – 2021-06-20 (×5): 1 via ORAL
  Filled 2021-06-18 (×5): qty 1

## 2021-06-18 NOTE — Progress Notes (Signed)
Patient ID: Erin Carr, female   DOB: [birth year], 87 y.o.   MRN: [id number]  PROGRESS NOTE    MARYNELL BIES  PJA:[id number] DOB: 04/21/1930 DOA: 06/07/2021 PCP: Vincente [place], MD   Brief Narrative:  87 year old female with history of unspecified CVA, right foot drop, non-STEMI, chronic diastolic CHF, NSCLC in remission status post radiotherapy, hypertension, diabetes mellitus type 2, asthma and hyperlipidemia presented with bilateral leg weakness along with urinary incontinence.  CT of the head showed no acute stroke, creatinine elevated at 1.46 and a UA had significant pyuria.  She was started on IV fluids and IV Rocephin.  PT recommended SNF placement.  Patient subsequently developed fever and congestion and tested positive for COVID-19.  No evidence of pneumonia.  Treated with 3 doses of IV remdesivir.  Currently medically stable for discharge; SNF rehab complicated due to COVID-positive status.  Assessment & Plan:   Lower extremity weakness -Possibly infection related worsening of general functional decline. -Improving.  PT recommended SNF placement.  CT of the head was negative for acute abnormality.  UTI -Insignificant growth on culture.  Treated with Rocephin and subsequently discontinued  COVID-19 infection -No evidence of pneumonia.  CRP not significantly elevated.  Patient is vaccinated x2 -Status post 3 doses of remdesivir from 06/10/2021-06/12/2021.  Isolate for 10 days -Respiratory status currently stable and she is currently still on room air.  Stage IIIb CKD -Creatinine currently stable  Thrombocytopenia -Resolved  Leukopenia--resolved  Chronic diastolic CHF -Euvolemic.  Continue Lasix, lisinopril and metoprolol.  Strict input and output.  Daily weights.  Fluid restriction  History of unspecified CVA -Continue aspirin and statin  Diabetes mellitus type 2 with hyperglycemia -CBGs with SSI  Hyperlipidemia -Continue statin  History of NSCLC -Outpatient  follow-up with oncology  Generalized deconditioning -Palliative care following.  Patient remains full code.   DVT prophylaxis: Lovenox Code Status: Full Family Communication: None at bedside Disposition Plan: Status is: Inpatient  Remains inpatient appropriate because:Inpatient level of care appropriate due to severity of illness  Dispo: The patient is from: Home              Anticipated d/c is to: SNF              Patient currently is medically stable to d/c.   Difficult to place patient Yes   Consultants: Palliative care  Procedures: None  Antimicrobials:  Anti-infectives (From admission, onward)    Start     Dose/Rate Route Frequency Ordered Stop   06/11/21 1000  remdesivir 100 mg in sodium chloride 0.9 % 100 mL IVPB       See Hyperspace for full Linked Orders Report.   100 mg 200 mL/hr over 30 Minutes Intravenous Daily 06/10/21 1725 06/12/21 0926   06/10/21 1900  remdesivir 200 mg in sodium chloride 0.9% 250 mL IVPB       See Hyperspace for full Linked Orders Report.   200 mg 580 mL/hr over 30 Minutes Intravenous Once 06/10/21 1725 06/10/21 2128   06/10/21 1800  nirmatrelvir/ritonavir EUA (renal dosing) (PAXLOVID) TABS 2 tablet  Status:  Discontinued        2 tablet Oral 2 times daily 06/10/21 1706 06/10/21 1720   06/10/21 0000  cephALEXin (KEFLEX) 500 MG capsule        500 mg Oral 2 times daily 06/10/21 1555     06/09/21 0000  cefTRIAXone (ROCEPHIN) 1 g in sodium chloride 0.9 % 100 mL IVPB  Status:  Discontinued  1 g 200 mL/hr over 30 Minutes Intravenous Every 24 hours 06/07/21 2353 06/11/21 0722   06/07/21 2315  cefTRIAXone (ROCEPHIN) 1 g in sodium chloride 0.9 % 100 mL IVPB        1 g 200 mL/hr over 30 Minutes Intravenous  Once 06/07/21 2306 06/08/21 0054        Subjective: Patient seen and examined at bedside.  Patient is a poor historian.  No overnight worsening shortness of breath, fever or vomiting reported.   Objective: Vitals:   06/17/21 1118  06/17/21 2100 06/18/21 0339 06/18/21 0339  BP: (!) 135/53 (!) 133/56  134/67  Pulse: (!) 56 73  61  Resp: 18 18  16   Temp: 98.2 F (36.8 C) 98.9 F (37.2 C)  98.5 F (36.9 C)  TempSrc: Oral Oral  Oral  SpO2: 99% 100%  100%  Weight:   50.2 kg   Height:        Intake/Output Summary (Last 24 hours) at 06/18/2021 0829 Last data filed at 06/18/2021 0200 Gross per 24 hour  Intake --  Output 1150 ml  Net -1150 ml    Filed Weights   06/14/21 1325 06/16/21 0642 06/18/21 0339  Weight: 51.7 kg 47.9 kg 50.2 kg    Examination:  General exam: On room air currently.  No acute distress.  Poor historian.  Still slightly confused to time. Respiratory system: Decreased breath sounds at bases bilaterally cardiovascular system: Rate controlled, S1-S2 heard  gastrointestinal system: Abdomen is slightly distended, soft and nontender.  Bowel sounds are heard Extremities: Trace lower extremity edema; no cyanosis Data Reviewed: I have personally reviewed following labs and imaging studies  CBC: Recent Labs  Lab 06/14/21 0401 06/16/21 0344  WBC 4.4 5.5  NEUTROABS  --  3.3  HGB 10.7* 10.8*  HCT 34.6* 34.4*  MCV 85.2 85.1  PLT 202 008    Basic Metabolic Panel: Recent Labs  Lab 06/14/21 0401 06/16/21 0344 06/17/21 0353 06/18/21 0315  NA 135 139 136 136  K 3.5 4.5 4.3 4.6  CL 105 104 101 102  CO2 23 30 28 29   GLUCOSE 248* 308* 227* 185*  BUN 29* 25* 25* 31*  CREATININE 1.01* 1.14* 1.14* 1.42*  CALCIUM 8.9 9.2 9.0 9.1  MG  --  2.3  --   --     GFR: Estimated Creatinine Clearance: 21.7 mL/min (A) (by C-G formula based on SCr of 1.42 mg/dL (H)). Liver Function Tests: Recent Labs  Lab 06/16/21 0344 06/17/21 0353 06/18/21 0315  AST 22 19 19   ALT 24 19 20   ALKPHOS 81 76 77  BILITOT 0.6 0.6 0.4  PROT 5.8* 5.5* 5.7*  ALBUMIN 3.0* 2.7* 2.8*    No results for input(s): LIPASE, AMYLASE in the last 168 hours. No results for input(s): AMMONIA in the last 168 hours. Coagulation  Profile: No results for input(s): INR, PROTIME in the last 168 hours. Cardiac Enzymes: No results for input(s): CKTOTAL, CKMB, CKMBINDEX, TROPONINI in the last 168 hours. BNP (last 3 results) No results for input(s): PROBNP in the last 8760 hours. HbA1C: Recent Labs    06/18/21 0315  HGBA1C 8.2*   CBG: Recent Labs  Lab 06/17/21 1112 06/17/21 1651 06/17/21 2202 06/18/21 0742  GLUCAP 229* 251* 197* 160*   Lipid Profile: No results for input(s): CHOL, HDL, LDLCALC, TRIG, CHOLHDL, LDLDIRECT in the last 72 hours. Thyroid Function Tests: No results for input(s): TSH, T4TOTAL, FREET4, T3FREE, THYROIDAB in the last 72 hours. Anemia Panel: No results  for input(s): VITAMINB12, FOLATE, FERRITIN, TIBC, IRON, RETICCTPCT in the last 72 hours. Sepsis Labs: No results for input(s): PROCALCITON, LATICACIDVEN in the last 168 hours.   Recent Results (from the past 240 hour(s))  SARS CORONAVIRUS 2 (TAT 6-24 HRS) Nasopharyngeal Nasopharyngeal Swab     Status: Abnormal   Collection Time: 06/10/21 11:48 AM   Specimen: Nasopharyngeal Swab  Result Value Ref Range Status   SARS Coronavirus 2 POSITIVE (A) NEGATIVE Final    Comment: (NOTE) SARS-CoV-2 target nucleic acids are DETECTED.  The SARS-CoV-2 RNA is generally detectable in upper and lower respiratory specimens during the acute phase of infection. Positive results are indicative of the presence of SARS-CoV-2 RNA. Clinical correlation with patient history and other diagnostic information is  necessary to determine patient infection status. Positive results do not rule out bacterial infection or co-infection with other viruses.  The expected result is Negative.  Fact Sheet for Patients: SugarRoll.be  Fact Sheet for Healthcare Providers: https://www.woods-mathews.com/  This test is not yet approved or cleared by the [place] FDA and  has been authorized for detection and/or diagnosis of  SARS-CoV-2 by FDA under an Emergency Use Authorization (EUA). This EUA will remain  in effect (meaning this test can be used) for the duration of the COVID-19 declaration under Section 564(b)(1) of the Act, 21 U. S.C. section 360bbb-3(b)(1), unless the authorization is terminated or revoked sooner.   Performed at [facility], [place] [address] [place], [place] [address]   Resp Panel by RT-PCR (Flu A&B, Covid) Nasopharyngeal Swab     Status: Abnormal   Collection Time: 06/10/21  3:37 PM   Specimen: Nasopharyngeal Swab; Nasopharyngeal(NP) swabs in vial transport medium  Result Value Ref Range Status   SARS Coronavirus 2 by RT PCR POSITIVE (A) NEGATIVE Final    Comment: RESULT CALLED TO, READ BACK BY AND VERIFIED WITH: Coolidge Breeze RN [id number] 06/10/21 A BROWNING (NOTE) SARS-CoV-2 target nucleic acids are DETECTED.  The SARS-CoV-2 RNA is generally detectable in upper respiratory specimens during the acute phase of infection. Positive results are indicative of the presence of the identified virus, but do not rule out bacterial infection or co-infection with other pathogens not detected by the test. Clinical correlation with patient history and other diagnostic information is necessary to determine patient infection status. The expected result is Negative.  Fact Sheet for Patients: EntrepreneurPulse.com.au  Fact Sheet for Healthcare Providers: IncredibleEmployment.be  This test is not yet approved or cleared by the [place] FDA and  has been authorized for detection and/or diagnosis of SARS-CoV-2 by FDA under an Emergency Use Authorization (EUA).  This EUA will remain in effect (meaning this test can b e used) for the duration of  the COVID-19 declaration under Section 564(b)(1) of the Act, 21 U.S.C. section 360bbb-3(b)(1), unless the authorization is terminated or revoked sooner.     Influenza A by PCR NEGATIVE NEGATIVE Final   Influenza B  by PCR NEGATIVE NEGATIVE Final    Comment: (NOTE) The Xpert Xpress SARS-CoV-2/FLU/RSV plus assay is intended as an aid in the diagnosis of influenza from Nasopharyngeal swab specimens and should not be used as a sole basis for treatment. Nasal washings and aspirates are unacceptable for Xpert Xpress SARS-CoV-2/FLU/RSV testing.  Fact Sheet for Patients: EntrepreneurPulse.com.au  Fact Sheet for Healthcare Providers: IncredibleEmployment.be  This test is not yet approved or cleared by the [place] FDA and has been authorized for detection and/or diagnosis of SARS-CoV-2 by FDA under an Emergency Use Authorization (  EUA). This EUA will remain in effect (meaning this test can be used) for the duration of the COVID-19 declaration under Section 564(b)(1) of the Act, 21 U.S.C. section 360bbb-3(b)(1), unless the authorization is terminated or revoked.  Performed at [facility], [place] [address] [place],  [address]           Radiology Studies: No results found.      Scheduled Meds:  amLODipine  10 mg Oral Daily   vitamin C  500 mg Oral Daily   aspirin EC  81 mg Oral Daily   atorvastatin  40 mg Oral q1800   brimonidine  1 drop Left Eye BID   enoxaparin (LOVENOX) injection  30 mg Subcutaneous Q24H   furosemide  40 mg Oral Daily   gabapentin  400 mg Oral BID   insulin aspart  0-5 Units Subcutaneous QHS   insulin aspart  0-9 Units Subcutaneous TID WC   ipratropium  2 spray Each Nare BID   latanoprost  1 drop Left Eye QHS   lisinopril  20 mg Oral Daily   metoprolol tartrate  12.5 mg Oral BID   potassium chloride  10 mEq Oral Daily   Vitamin D (Ergocalciferol)  50,000 Units Oral Weekly   zinc sulfate  220 mg Oral Daily   Continuous Infusions:        Aline August, MD [facility] 06/18/2021, 8:29 AM

## 2021-06-19 DIAGNOSIS — N3000 Acute cystitis without hematuria: Secondary | ICD-10-CM | POA: Diagnosis not present

## 2021-06-19 DIAGNOSIS — I5022 Chronic systolic (congestive) heart failure: Secondary | ICD-10-CM | POA: Diagnosis not present

## 2021-06-19 DIAGNOSIS — U071 COVID-19: Secondary | ICD-10-CM | POA: Diagnosis not present

## 2021-06-19 LAB — COMPREHENSIVE METABOLIC PANEL
ALT: 19 U/L (ref 0–44)
AST: 20 U/L (ref 15–41)
Albumin: 2.7 g/dL — ABNORMAL LOW (ref 3.5–5.0)
Alkaline Phosphatase: 75 U/L (ref 38–126)
Anion gap: 6 (ref 5–15)
BUN: 27 mg/dL — ABNORMAL HIGH (ref 8–23)
CO2: 28 mmol/L (ref 22–32)
Calcium: 9.1 mg/dL (ref 8.9–10.3)
Chloride: 99 mmol/L (ref 98–111)
Creatinine, Ser: 1.09 mg/dL — ABNORMAL HIGH (ref 0.44–1.00)
GFR, Estimated: 49 mL/min — ABNORMAL LOW (ref >60)
Glucose, Bld: 213 mg/dL — ABNORMAL HIGH (ref 70–99)
Potassium: 4.6 mmol/L (ref 3.5–5.1)
Sodium: 133 mmol/L — ABNORMAL LOW (ref 135–145)
Total Bilirubin: 0.7 mg/dL (ref 0.3–1.2)
Total Protein: 5.5 g/dL — ABNORMAL LOW (ref 6.5–8.1)

## 2021-06-19 LAB — C-REACTIVE PROTEIN: CRP: 0.6 mg/dL (ref <1.0)

## 2021-06-19 LAB — GLUCOSE, CAPILLARY
Glucose-Capillary: 190 mg/dL — ABNORMAL HIGH (ref 70–99)
Glucose-Capillary: 261 mg/dL — ABNORMAL HIGH (ref 70–99)
Glucose-Capillary: 166 mg/dL — ABNORMAL HIGH (ref 70–99)
Glucose-Capillary: 181 mg/dL — ABNORMAL HIGH (ref 70–99)

## 2021-06-19 LAB — D-DIMER, QUANTITATIVE: D-Dimer, Quant: 1.76 ug/mL-FEU — ABNORMAL HIGH (ref 0.00–0.50)

## 2021-06-19 MED ORDER — BISACODYL 10 MG RE SUPP
10.0000 mg | Freq: Every day | RECTAL | Status: DC | PRN
  Administered 2021-06-19: 10 mg via RECTAL
  Filled 2021-06-19: qty 1

## 2021-06-19 MED ORDER — POLYETHYLENE GLYCOL 3350 17 G PO PACK
17.0000 g | PACK | Freq: Two times a day (BID) | ORAL | Status: DC
  Administered 2021-06-19 – 2021-06-20 (×2): 17 g via ORAL
  Filled 2021-06-19 (×2): qty 1

## 2021-06-19 NOTE — Progress Notes (Signed)
Patient ID: Erin Carr, female   DOB: 1930/09/11, 87 y.o.   MRN: [id number]  PROGRESS NOTE    Erin Carr  ION:[id number] DOB: 23-Aug-1930 DOA: 06/07/2021 PCP: Vincente Liberty, MD   Brief Narrative:  86 year old female with history of unspecified CVA, right foot drop, non-STEMI, chronic diastolic CHF, NSCLC in remission status post radiotherapy, hypertension, diabetes mellitus type 2, asthma and hyperlipidemia presented with bilateral leg weakness along with urinary incontinence.  CT of the head showed no acute stroke, creatinine elevated at 1.46 and a UA had significant pyuria.  She was started on IV fluids and IV Rocephin.  PT recommended SNF placement.  Patient subsequently developed fever and congestion and tested positive for COVID-19.  No evidence of pneumonia.  Treated with 3 doses of IV remdesivir.  Currently medically stable for discharge; SNF placement complicated due to COVID-positive status.  Assessment & Plan:   Lower extremity weakness -Possibly infection related worsening of general functional decline. -Improving.  PT recommended SNF placement.  CT of the head was negative for acute abnormality.  UTI -Insignificant growth on culture.  Treated with Rocephin and subsequently discontinued  COVID-19 infection -No evidence of pneumonia.  CRP not significantly elevated.  Patient is vaccinated x2 -Status post 3 doses of remdesivir from 06/10/2021-06/12/2021.  Isolate for 10 days -Respiratory status currently stable and she is currently still on room air.  Stage IIIb CKD -Creatinine currently stable  Thrombocytopenia -Resolved  Leukopenia--resolved  Chronic diastolic CHF -Euvolemic.  Continue Lasix, lisinopril and metoprolol.  Strict input and output.  Daily weights.  Fluid restriction  History of unspecified CVA -Continue aspirin and statin  Diabetes mellitus type 2 with hyperglycemia -CBGs with SSI  Hyperlipidemia -Continue statin  History of  NSCLC -Outpatient follow-up with oncology  Generalized deconditioning -Palliative care following.  Patient remains full code.   DVT prophylaxis: Lovenox Code Status: Full Family Communication: None at bedside Disposition Plan: Status is: Inpatient  Remains inpatient appropriate because:Inpatient level of care appropriate due to severity of illness  Dispo: The patient is from: Home              Anticipated d/c is to: SNF              Patient currently is medically stable to d/c.   Difficult to place patient Yes   Consultants: Palliative care  Procedures: None  Antimicrobials:  Anti-infectives (From admission, onward)    Start     Dose/Rate Route Frequency Ordered Stop   06/11/21 1000  remdesivir 100 mg in sodium chloride 0.9 % 100 mL IVPB       See Hyperspace for full Linked Orders Report.   100 mg 200 mL/hr over 30 Minutes Intravenous Daily 06/10/21 1725 06/12/21 0926   06/10/21 1900  remdesivir 200 mg in sodium chloride 0.9% 250 mL IVPB       See Hyperspace for full Linked Orders Report.   200 mg 580 mL/hr over 30 Minutes Intravenous Once 06/10/21 1725 06/10/21 2128   06/10/21 1800  nirmatrelvir/ritonavir EUA (renal dosing) (PAXLOVID) TABS 2 tablet  Status:  Discontinued        2 tablet Oral 2 times daily 06/10/21 1706 06/10/21 1720   06/10/21 0000  cephALEXin (KEFLEX) 500 MG capsule        500 mg Oral 2 times daily 06/10/21 1555     06/09/21 0000  cefTRIAXone (ROCEPHIN) 1 g in sodium chloride 0.9 % 100 mL IVPB  Status:  Discontinued  1 g 200 mL/hr over 30 Minutes Intravenous Every 24 hours 06/07/21 2353 06/11/21 0722   06/07/21 2315  cefTRIAXone (ROCEPHIN) 1 g in sodium chloride 0.9 % 100 mL IVPB        1 g 200 mL/hr over 30 Minutes Intravenous  Once 06/07/21 2306 06/08/21 0054        Subjective: Patient seen and examined at bedside.  Patient is a poor historian.  No fever, abdominal vomiting or worsening shortness of breath reported.   Objective: Vitals:    06/18/21 1314 06/18/21 2145 06/19/21 0438 06/19/21 0440  BP: (!) 105/54 (!) 126/54 (!) 122/56   Pulse: 64 65 (!) 57   Resp: 16 16 18    Temp: 98.6 F (37 C) 98.4 F (36.9 C) 97.9 F (36.6 C)   TempSrc: Oral     SpO2: 99% 100% 100%   Weight:    54.1 kg  Height:        Intake/Output Summary (Last 24 hours) at 06/19/2021 0802 Last data filed at 06/19/2021 0500 Gross per 24 hour  Intake --  Output 1450 ml  Net -1450 ml    Filed Weights   06/16/21 0642 06/18/21 0339 06/19/21 0440  Weight: 47.9 kg 50.2 kg 54.1 kg    Examination:  General exam: No distress.  Still on room air currently.  Poor historian.  Still slightly confused to time. Respiratory system: Bilateral decreased breath sounds at bases  cardiovascular system: S1-S2 heard, intermittently bradycardic gastrointestinal system: Abdomen is distended mildly, soft and nontender.  Normal bowel sounds heard Extremities: No clubbing; mild lower extremity edema present  data Reviewed: I have personally reviewed following labs and imaging studies  CBC: Recent Labs  Lab 06/14/21 0401 06/16/21 0344  WBC 4.4 5.5  NEUTROABS  --  3.3  HGB 10.7* 10.8*  HCT 34.6* 34.4*  MCV 85.2 85.1  PLT 202 269    Basic Metabolic Panel: Recent Labs  Lab 06/14/21 0401 06/16/21 0344 06/17/21 0353 06/18/21 0315 06/19/21 0647  NA 135 139 136 136 133*  K 3.5 4.5 4.3 4.6 4.6  CL 105 104 101 102 99  CO2 23 30 28 29 28   GLUCOSE 248* 308* 227* 185* 213*  BUN 29* 25* 25* 31* 27*  CREATININE 1.01* 1.14* 1.14* 1.42* 1.09*  CALCIUM 8.9 9.2 9.0 9.1 9.1  MG  --  2.3  --   --   --     GFR: Estimated Creatinine Clearance: 28.2 mL/min (A) (by C-G formula based on SCr of 1.09 mg/dL (H)). Liver Function Tests: Recent Labs  Lab 06/16/21 0344 06/17/21 0353 06/18/21 0315 06/19/21 0647  AST 22 19 19 20   ALT 24 19 20 19   ALKPHOS 81 76 77 75  BILITOT 0.6 0.6 0.4 0.7  PROT 5.8* 5.5* 5.7* 5.5*  ALBUMIN 3.0* 2.7* 2.8* 2.7*    No results  for input(s): LIPASE, AMYLASE in the last 168 hours. No results for input(s): AMMONIA in the last 168 hours. Coagulation Profile: No results for input(s): INR, PROTIME in the last 168 hours. Cardiac Enzymes: No results for input(s): CKTOTAL, CKMB, CKMBINDEX, TROPONINI in the last 168 hours. BNP (last 3 results) No results for input(s): PROBNP in the last 8760 hours. HbA1C: Recent Labs    06/18/21 0315  HGBA1C 8.2*    CBG: Recent Labs  Lab 06/18/21 0742 06/18/21 1122 06/18/21 1653 06/18/21 2154 06/19/21 0751  GLUCAP 160* 300* 155* 131* 190*    Lipid Profile: No results for input(s): CHOL, HDL, LDLCALC,  TRIG, CHOLHDL, LDLDIRECT in the last 72 hours. Thyroid Function Tests: No results for input(s): TSH, T4TOTAL, FREET4, T3FREE, THYROIDAB in the last 72 hours. Anemia Panel: No results for input(s): VITAMINB12, FOLATE, FERRITIN, TIBC, IRON, RETICCTPCT in the last 72 hours. Sepsis Labs: No results for input(s): PROCALCITON, LATICACIDVEN in the last 168 hours.   Recent Results (from the past 240 hour(s))  SARS CORONAVIRUS 2 (TAT 6-24 HRS) Nasopharyngeal Nasopharyngeal Swab     Status: Abnormal   Collection Time: 06/10/21 11:48 AM   Specimen: Nasopharyngeal Swab  Result Value Ref Range Status   SARS Coronavirus 2 POSITIVE (A) NEGATIVE Final    Comment: (NOTE) SARS-CoV-2 target nucleic acids are DETECTED.  The SARS-CoV-2 RNA is generally detectable in upper and lower respiratory specimens during the acute phase of infection. Positive results are indicative of the presence of SARS-CoV-2 RNA. Clinical correlation with patient history and other diagnostic information is  necessary to determine patient infection status. Positive results do not rule out bacterial infection or co-infection with other viruses.  The expected result is Negative.  Fact Sheet for Patients: SugarRoll.be  Fact Sheet for Healthcare  Providers: https://www.woods-mathews.com/  This test is not yet approved or cleared by the [place] FDA and  has been authorized for detection and/or diagnosis of SARS-CoV-2 by FDA under an Emergency Use Authorization (EUA). This EUA will remain  in effect (meaning this test can be used) for the duration of the COVID-19 declaration under Section 564(b)(1) of the Act, 21 U. S.C. section 360bbb-3(b)(1), unless the authorization is terminated or revoked sooner.   Performed at [facility], [place] [address], [place], [place] [address]   Resp Panel by RT-PCR (Flu A&B, Covid) Nasopharyngeal Swab     Status: Abnormal   Collection Time: 06/10/21  3:37 PM   Specimen: Nasopharyngeal Swab; Nasopharyngeal(NP) swabs in vial transport medium  Result Value Ref Range Status   SARS Coronavirus 2 by RT PCR POSITIVE (A) NEGATIVE Final    Comment: RESULT CALLED TO, READ BACK BY AND VERIFIED WITH: Coolidge Breeze RN [id number] 06/10/21 A BROWNING (NOTE) SARS-CoV-2 target nucleic acids are DETECTED.  The SARS-CoV-2 RNA is generally detectable in upper respiratory specimens during the acute phase of infection. Positive results are indicative of the presence of the identified virus, but do not rule out bacterial infection or co-infection with other pathogens not detected by the test. Clinical correlation with patient history and other diagnostic information is necessary to determine patient infection status. The expected result is Negative.  Fact Sheet for Patients: EntrepreneurPulse.com.au  Fact Sheet for Healthcare Providers: IncredibleEmployment.be  This test is not yet approved or cleared by the [place] FDA and  has been authorized for detection and/or diagnosis of SARS-CoV-2 by FDA under an Emergency Use Authorization (EUA).  This EUA will remain in effect (meaning this test can b e used) for the duration of  the COVID-19 declaration under Section  564(b)(1) of the Act, 21 U.S.C. section 360bbb-3(b)(1), unless the authorization is terminated or revoked sooner.     Influenza A by PCR NEGATIVE NEGATIVE Final   Influenza B by PCR NEGATIVE NEGATIVE Final    Comment: (NOTE) The Xpert Xpress SARS-CoV-2/FLU/RSV plus assay is intended as an aid in the diagnosis of influenza from Nasopharyngeal swab specimens and should not be used as a sole basis for treatment. Nasal washings and aspirates are unacceptable for Xpert Xpress SARS-CoV-2/FLU/RSV testing.  Fact Sheet for Patients: EntrepreneurPulse.com.au  Fact Sheet for Healthcare Providers: IncredibleEmployment.be  This  test is not yet approved or cleared by the [place] and has been authorized for detection and/or diagnosis of SARS-CoV-2 by FDA under an Emergency Use Authorization (EUA). This EUA will remain in effect (meaning this test can be used) for the duration of the COVID-19 declaration under Section 564(b)(1) of the Act, 21 U.S.C. section 360bbb-3(b)(1), unless the authorization is terminated or revoked.  Performed at [facility], [place] [address] [place], [place] [address]           Radiology Studies: No results found.      Scheduled Meds:  amLODipine  10 mg Oral Daily   vitamin C  500 mg Oral Daily   aspirin EC  81 mg Oral Daily   atorvastatin  40 mg Oral q1800   brimonidine  1 drop Left Eye BID   enoxaparin (LOVENOX) injection  30 mg Subcutaneous Q24H   furosemide  40 mg Oral Daily   gabapentin  400 mg Oral BID   insulin aspart  0-5 Units Subcutaneous QHS   insulin aspart  0-9 Units Subcutaneous TID WC   ipratropium  2 spray Each Nare BID   latanoprost  1 drop Left Eye QHS   lisinopril  20 mg Oral Daily   metoprolol tartrate  12.5 mg Oral BID   potassium chloride  10 mEq Oral Daily   senna-docusate  1 tablet Oral BID   Vitamin D (Ergocalciferol)  50,000 Units Oral Weekly   zinc sulfate  220 mg Oral  Daily   Continuous Infusions:        Aline August, MD [facility] 06/19/2021, 8:02 AM

## 2021-06-20 DIAGNOSIS — D509 Iron deficiency anemia, unspecified: Secondary | ICD-10-CM | POA: Diagnosis not present

## 2021-06-20 DIAGNOSIS — E1140 Type 2 diabetes mellitus with diabetic neuropathy, unspecified: Secondary | ICD-10-CM | POA: Diagnosis not present

## 2021-06-20 DIAGNOSIS — Z7982 Long term (current) use of aspirin: Secondary | ICD-10-CM | POA: Diagnosis not present

## 2021-06-20 DIAGNOSIS — I130 Hypertensive heart and chronic kidney disease with heart failure and stage 1 through stage 4 chronic kidney disease, or unspecified chronic kidney disease: Secondary | ICD-10-CM | POA: Diagnosis not present

## 2021-06-20 DIAGNOSIS — Z8673 Personal history of transient ischemic attack (TIA), and cerebral infarction without residual deficits: Secondary | ICD-10-CM | POA: Diagnosis not present

## 2021-06-20 DIAGNOSIS — I69398 Other sequelae of cerebral infarction: Secondary | ICD-10-CM | POA: Diagnosis not present

## 2021-06-20 DIAGNOSIS — L89626 Pressure-induced deep tissue damage of left heel: Secondary | ICD-10-CM | POA: Diagnosis not present

## 2021-06-20 DIAGNOSIS — U099 Post covid-19 condition, unspecified: Secondary | ICD-10-CM | POA: Diagnosis not present

## 2021-06-20 DIAGNOSIS — Z87440 Personal history of urinary (tract) infections: Secondary | ICD-10-CM | POA: Diagnosis not present

## 2021-06-20 DIAGNOSIS — J449 Chronic obstructive pulmonary disease, unspecified: Secondary | ICD-10-CM | POA: Diagnosis not present

## 2021-06-20 DIAGNOSIS — D696 Thrombocytopenia, unspecified: Secondary | ICD-10-CM | POA: Diagnosis not present

## 2021-06-20 DIAGNOSIS — I252 Old myocardial infarction: Secondary | ICD-10-CM | POA: Diagnosis not present

## 2021-06-20 DIAGNOSIS — I959 Hypotension, unspecified: Secondary | ICD-10-CM | POA: Diagnosis not present

## 2021-06-20 DIAGNOSIS — N3000 Acute cystitis without hematuria: Secondary | ICD-10-CM | POA: Diagnosis not present

## 2021-06-20 DIAGNOSIS — E1165 Type 2 diabetes mellitus with hyperglycemia: Secondary | ICD-10-CM | POA: Diagnosis not present

## 2021-06-20 DIAGNOSIS — U071 COVID-19: Secondary | ICD-10-CM | POA: Diagnosis not present

## 2021-06-20 DIAGNOSIS — Z970 Presence of artificial eye: Secondary | ICD-10-CM | POA: Diagnosis not present

## 2021-06-20 DIAGNOSIS — I5022 Chronic systolic (congestive) heart failure: Secondary | ICD-10-CM | POA: Diagnosis not present

## 2021-06-20 DIAGNOSIS — I5032 Chronic diastolic (congestive) heart failure: Secondary | ICD-10-CM | POA: Diagnosis not present

## 2021-06-20 DIAGNOSIS — R5383 Other fatigue: Secondary | ICD-10-CM | POA: Diagnosis not present

## 2021-06-20 DIAGNOSIS — E46 Unspecified protein-calorie malnutrition: Secondary | ICD-10-CM | POA: Diagnosis not present

## 2021-06-20 DIAGNOSIS — I5040 Unspecified combined systolic (congestive) and diastolic (congestive) heart failure: Secondary | ICD-10-CM | POA: Diagnosis not present

## 2021-06-20 DIAGNOSIS — R051 Acute cough: Secondary | ICD-10-CM | POA: Diagnosis not present

## 2021-06-20 DIAGNOSIS — R29898 Other symptoms and signs involving the musculoskeletal system: Secondary | ICD-10-CM | POA: Diagnosis not present

## 2021-06-20 DIAGNOSIS — R2689 Other abnormalities of gait and mobility: Secondary | ICD-10-CM | POA: Diagnosis not present

## 2021-06-20 DIAGNOSIS — N390 Urinary tract infection, site not specified: Secondary | ICD-10-CM | POA: Diagnosis not present

## 2021-06-20 DIAGNOSIS — J45909 Unspecified asthma, uncomplicated: Secondary | ICD-10-CM | POA: Diagnosis not present

## 2021-06-20 DIAGNOSIS — I10 Essential (primary) hypertension: Secondary | ICD-10-CM | POA: Diagnosis not present

## 2021-06-20 DIAGNOSIS — N1832 Chronic kidney disease, stage 3b: Secondary | ICD-10-CM | POA: Diagnosis not present

## 2021-06-20 DIAGNOSIS — M6281 Muscle weakness (generalized): Secondary | ICD-10-CM | POA: Diagnosis not present

## 2021-06-20 DIAGNOSIS — E559 Vitamin D deficiency, unspecified: Secondary | ICD-10-CM | POA: Diagnosis not present

## 2021-06-20 DIAGNOSIS — L89152 Pressure ulcer of sacral region, stage 2: Secondary | ICD-10-CM | POA: Diagnosis not present

## 2021-06-20 DIAGNOSIS — Z8616 Personal history of COVID-19: Secondary | ICD-10-CM | POA: Diagnosis not present

## 2021-06-20 DIAGNOSIS — M48061 Spinal stenosis, lumbar region without neurogenic claudication: Secondary | ICD-10-CM | POA: Diagnosis not present

## 2021-06-20 DIAGNOSIS — N179 Acute kidney failure, unspecified: Secondary | ICD-10-CM | POA: Diagnosis not present

## 2021-06-20 DIAGNOSIS — R0989 Other specified symptoms and signs involving the circulatory and respiratory systems: Secondary | ICD-10-CM | POA: Diagnosis not present

## 2021-06-20 DIAGNOSIS — R5381 Other malaise: Secondary | ICD-10-CM | POA: Diagnosis not present

## 2021-06-20 DIAGNOSIS — E785 Hyperlipidemia, unspecified: Secondary | ICD-10-CM | POA: Diagnosis not present

## 2021-06-20 DIAGNOSIS — Z9181 History of falling: Secondary | ICD-10-CM | POA: Diagnosis not present

## 2021-06-20 DIAGNOSIS — Z7401 Bed confinement status: Secondary | ICD-10-CM | POA: Diagnosis not present

## 2021-06-20 DIAGNOSIS — Z515 Encounter for palliative care: Secondary | ICD-10-CM | POA: Diagnosis not present

## 2021-06-20 DIAGNOSIS — Z85118 Personal history of other malignant neoplasm of bronchus and lung: Secondary | ICD-10-CM | POA: Diagnosis not present

## 2021-06-20 DIAGNOSIS — E1122 Type 2 diabetes mellitus with diabetic chronic kidney disease: Secondary | ICD-10-CM | POA: Diagnosis not present

## 2021-06-20 DIAGNOSIS — K5900 Constipation, unspecified: Secondary | ICD-10-CM | POA: Diagnosis not present

## 2021-06-20 DIAGNOSIS — M21371 Foot drop, right foot: Secondary | ICD-10-CM | POA: Diagnosis not present

## 2021-06-20 DIAGNOSIS — Z209 Contact with and (suspected) exposure to unspecified communicable disease: Secondary | ICD-10-CM | POA: Diagnosis not present

## 2021-06-20 DIAGNOSIS — N39498 Other specified urinary incontinence: Secondary | ICD-10-CM | POA: Diagnosis not present

## 2021-06-20 DIAGNOSIS — M810 Age-related osteoporosis without current pathological fracture: Secondary | ICD-10-CM | POA: Diagnosis not present

## 2021-06-20 DIAGNOSIS — H409 Unspecified glaucoma: Secondary | ICD-10-CM | POA: Diagnosis not present

## 2021-06-20 LAB — COMPREHENSIVE METABOLIC PANEL
ALT: 18 U/L (ref 0–44)
AST: 18 U/L (ref 15–41)
Albumin: 2.9 g/dL — ABNORMAL LOW (ref 3.5–5.0)
Alkaline Phosphatase: 78 U/L (ref 38–126)
Anion gap: 6 (ref 5–15)
BUN: 26 mg/dL — ABNORMAL HIGH (ref 8–23)
CO2: 30 mmol/L (ref 22–32)
Calcium: 9.2 mg/dL (ref 8.9–10.3)
Chloride: 100 mmol/L (ref 98–111)
Creatinine, Ser: 1.29 mg/dL — ABNORMAL HIGH (ref 0.44–1.00)
GFR, Estimated: 40 mL/min — ABNORMAL LOW (ref >60)
Glucose, Bld: 160 mg/dL — ABNORMAL HIGH (ref 70–99)
Potassium: 4.7 mmol/L (ref 3.5–5.1)
Sodium: 136 mmol/L (ref 135–145)
Total Bilirubin: 0.5 mg/dL (ref 0.3–1.2)
Total Protein: 5.5 g/dL — ABNORMAL LOW (ref 6.5–8.1)

## 2021-06-20 LAB — GLUCOSE, CAPILLARY
Glucose-Capillary: 117 mg/dL — ABNORMAL HIGH (ref 70–99)
Glucose-Capillary: 233 mg/dL — ABNORMAL HIGH (ref 70–99)

## 2021-06-20 LAB — D-DIMER, QUANTITATIVE: D-Dimer, Quant: 0.76 ug/mL-FEU — ABNORMAL HIGH (ref 0.00–0.50)

## 2021-06-20 LAB — C-REACTIVE PROTEIN: CRP: 0.6 mg/dL (ref <1.0)

## 2021-06-20 MED ORDER — FUROSEMIDE 20 MG PO TABS
40.0000 mg | ORAL_TABLET | Freq: Every day | ORAL | Status: DC
Start: 2021-06-20 — End: 2022-01-04

## 2021-06-20 NOTE — Progress Notes (Signed)
[facility]p to be D/C'd to [facility] per MD order. Report called to [place], LPN. IV catheter discontinued intact. Site without signs and symptoms of complications. Dressing and pressure applied.  An After Visit Summary was printed and given to PTAR.  Patient escorted via stretcher, and D/C via [facility].  Erin Carr  06/20/2021 1:36 PM

## 2021-06-20 NOTE — Progress Notes (Signed)
Patient ID: Erin Carr, female   DOB: [birth year], 87 y.o.   MRN: [id number]  PROGRESS NOTE    Erin Carr  GEX:[id number] DOB: 09/07/1930 DOA: 06/07/2021 PCP: Vincente Liberty, MD   Brief Narrative:  87 year old female with history of unspecified CVA, right foot drop, non-STEMI, chronic diastolic CHF, NSCLC in remission status post radiotherapy, hypertension, diabetes mellitus type 2, asthma and hyperlipidemia presented with bilateral leg weakness along with urinary incontinence.  CT of the head showed no acute stroke, creatinine elevated at 1.46 and a UA had significant pyuria.  She was started on IV fluids and IV Rocephin.  PT recommended SNF placement.  Patient subsequently developed fever and congestion and tested positive for COVID-19.  No evidence of pneumonia.  Treated with 3 doses of IV remdesivir.  Currently medically stable for discharge; SNF placement complicated due to COVID-positive status.  Assessment & Plan:   Lower extremity weakness -Possibly infection related worsening of general functional decline. -Improving.  PT recommended SNF placement.  CT of the head was negative for acute abnormality.  UTI -Insignificant growth on culture.  Treated with Rocephin and subsequently discontinued  COVID-19 infection -No evidence of pneumonia.  CRP not significantly elevated.  Patient is vaccinated x2 -Status post 3 doses of remdesivir from 06/10/2021-06/12/2021.  Isolate for 10 days -Respiratory status currently stable and she is currently still on room air.  Stage IIIb CKD -Creatinine currently stable  Thrombocytopenia -Resolved  Leukopenia--resolved  Chronic diastolic CHF -Euvolemic.  Continue Lasix, lisinopril and metoprolol.  Strict input and output.  Daily weights.  Fluid restriction  History of unspecified CVA -Continue aspirin and statin  Diabetes mellitus type 2 with hyperglycemia -CBGs with SSI  Hyperlipidemia -Continue statin  History of  NSCLC -Outpatient follow-up with oncology  Generalized deconditioning -Palliative care following.  Patient remains full code.   DVT prophylaxis: Lovenox Code Status: Full Family Communication: None at bedside Disposition Plan: Status is: Inpatient  Remains inpatient appropriate because:Inpatient level of care appropriate due to severity of illness  Dispo: The patient is from: Home              Anticipated d/c is to: SNF              Patient currently is medically stable to d/c.   Difficult to place patient Yes   Consultants: Palliative care  Procedures: None  Antimicrobials:  Anti-infectives (From admission, onward)    Start     Dose/Rate Route Frequency Ordered Stop   06/11/21 1000  remdesivir 100 mg in sodium chloride 0.9 % 100 mL IVPB       See Hyperspace for full Linked Orders Report.   100 mg 200 mL/hr over 30 Minutes Intravenous Daily 06/10/21 1725 06/12/21 0926   06/10/21 1900  remdesivir 200 mg in sodium chloride 0.9% 250 mL IVPB       See Hyperspace for full Linked Orders Report.   200 mg 580 mL/hr over 30 Minutes Intravenous Once 06/10/21 1725 06/10/21 2128   06/10/21 1800  nirmatrelvir/ritonavir EUA (renal dosing) (PAXLOVID) TABS 2 tablet  Status:  Discontinued        2 tablet Oral 2 times daily 06/10/21 1706 06/10/21 1720   06/10/21 0000  cephALEXin (KEFLEX) 500 MG capsule        500 mg Oral 2 times daily 06/10/21 1555     06/09/21 0000  cefTRIAXone (ROCEPHIN) 1 g in sodium chloride 0.9 % 100 mL IVPB  Status:  Discontinued  1 g 200 mL/hr over 30 Minutes Intravenous Every 24 hours 06/07/21 2353 06/11/21 0722   06/07/21 2315  cefTRIAXone (ROCEPHIN) 1 g in sodium chloride 0.9 % 100 mL IVPB        1 g 200 mL/hr over 30 Minutes Intravenous  Once 06/07/21 2306 06/08/21 0054        Subjective: Patient seen and examined at bedside.  Patient is a poor historian.  No worsening shortness of breath, nausea or vomiting reported. Objective: Vitals:    06/19/21 1113 06/19/21 2130 06/20/21 0424 06/20/21 0426  BP: (!) 114/49 (!) 118/51 108/63   Pulse: (!) 58 67 63   Resp: 16 14 15    Temp: 98.2 F (36.8 C) 98.2 F (36.8 C) 98.8 F (37.1 C)   TempSrc: Oral  Oral   SpO2: 100%  100%   Weight:    52.5 kg  Height:        Intake/Output Summary (Last 24 hours) at 06/20/2021 0753 Last data filed at 06/20/2021 0426 Gross per 24 hour  Intake 240 ml  Output 2400 ml  Net -2160 ml    Filed Weights   06/18/21 0339 06/19/21 0440 06/20/21 0426  Weight: 50.2 kg 54.1 kg 52.5 kg    Examination:  General exam: On room air. No distress. Poor historian.  Still slightly confused to time. Respiratory system: Decreased breath sounds at bases bilaterally cardiovascular system: currently rate controlled; S1-S2 heard gastrointestinal system: Abdomen is mildly distended, soft and nontender.  Bowel sounds are heard Extremities: Trace lower extremity edema present; no clubbing  data Reviewed: I have personally reviewed following labs and imaging studies  CBC: Recent Labs  Lab 06/14/21 0401 06/16/21 0344  WBC 4.4 5.5  NEUTROABS  --  3.3  HGB 10.7* 10.8*  HCT 34.6* 34.4*  MCV 85.2 85.1  PLT 202 767    Basic Metabolic Panel: Recent Labs  Lab 06/16/21 0344 06/17/21 0353 06/18/21 0315 06/19/21 0647 06/20/21 0336  NA 139 136 136 133* 136  K 4.5 4.3 4.6 4.6 4.7  CL 104 101 102 99 100  CO2 30 28 29 28 30   GLUCOSE 308* 227* 185* 213* 160*  BUN 25* 25* 31* 27* 26*  CREATININE 1.14* 1.14* 1.42* 1.09* 1.29*  CALCIUM 9.2 9.0 9.1 9.1 9.2  MG 2.3  --   --   --   --     GFR: Estimated Creatinine Clearance: 23.8 mL/min (A) (by C-G formula based on SCr of 1.29 mg/dL (H)). Liver Function Tests: Recent Labs  Lab 06/16/21 0344 06/17/21 0353 06/18/21 0315 06/19/21 0647 06/20/21 0336  AST 22 19 19 20 18   ALT 24 19 20 19 18   ALKPHOS 81 76 77 75 78  BILITOT 0.6 0.6 0.4 0.7 0.5  PROT 5.8* 5.5* 5.7* 5.5* 5.5*  ALBUMIN 3.0* 2.7* 2.8* 2.7* 2.9*     No results for input(s): LIPASE, AMYLASE in the last 168 hours. No results for input(s): AMMONIA in the last 168 hours. Coagulation Profile: No results for input(s): INR, PROTIME in the last 168 hours. Cardiac Enzymes: No results for input(s): CKTOTAL, CKMB, CKMBINDEX, TROPONINI in the last 168 hours. BNP (last 3 results) No results for input(s): PROBNP in the last 8760 hours. HbA1C: Recent Labs    06/18/21 0315  HGBA1C 8.2*    CBG: Recent Labs  Lab 06/18/21 2154 06/19/21 0751 06/19/21 1115 06/19/21 1658 06/19/21 2136  GLUCAP 131* 190* 261* 166* 181*    Lipid Profile: No results for input(s): CHOL, HDL,  LDLCALC, TRIG, CHOLHDL, LDLDIRECT in the last 72 hours. Thyroid Function Tests: No results for input(s): TSH, T4TOTAL, FREET4, T3FREE, THYROIDAB in the last 72 hours. Anemia Panel: No results for input(s): VITAMINB12, FOLATE, FERRITIN, TIBC, IRON, RETICCTPCT in the last 72 hours. Sepsis Labs: No results for input(s): PROCALCITON, LATICACIDVEN in the last 168 hours.   Recent Results (from the past 240 hour(s))  SARS CORONAVIRUS 2 (TAT 6-24 HRS) Nasopharyngeal Nasopharyngeal Swab     Status: Abnormal   Collection Time: 06/10/21 11:48 AM   Specimen: Nasopharyngeal Swab  Result Value Ref Range Status   SARS Coronavirus 2 POSITIVE (A) NEGATIVE Final    Comment: (NOTE) SARS-CoV-2 target nucleic acids are DETECTED.  The SARS-CoV-2 RNA is generally detectable in upper and lower respiratory specimens during the acute phase of infection. Positive results are indicative of the presence of SARS-CoV-2 RNA. Clinical correlation with patient history and other diagnostic information is  necessary to determine patient infection status. Positive results do not rule out bacterial infection or co-infection with other viruses.  The expected result is Negative.  Fact Sheet for Patients: SugarRoll.be  Fact Sheet for Healthcare  Providers: https://www.woods-mathews.com/  This test is not yet approved or cleared by the [place] FDA and  has been authorized for detection and/or diagnosis of SARS-CoV-2 by FDA under an Emergency Use Authorization (EUA). This EUA will remain  in effect (meaning this test can be used) for the duration of the COVID-19 declaration under Section 564(b)(1) of the Act, 21 U. S.C. section 360bbb-3(b)(1), unless the authorization is terminated or revoked sooner.   Performed at [facility], [place] [address] [place], [place] [address]   Resp Panel by RT-PCR (Flu A&B, Covid) Nasopharyngeal Swab     Status: Abnormal   Collection Time: 06/10/21  3:37 PM   Specimen: Nasopharyngeal Swab; Nasopharyngeal(NP) swabs in vial transport medium  Result Value Ref Range Status   SARS Coronavirus 2 by RT PCR POSITIVE (A) NEGATIVE Final    Comment: RESULT CALLED TO, READ BACK BY AND VERIFIED WITH: Coolidge Breeze RN [id number] 06/10/21 A BROWNING (NOTE) SARS-CoV-2 target nucleic acids are DETECTED.  The SARS-CoV-2 RNA is generally detectable in upper respiratory specimens during the acute phase of infection. Positive results are indicative of the presence of the identified virus, but do not rule out bacterial infection or co-infection with other pathogens not detected by the test. Clinical correlation with patient history and other diagnostic information is necessary to determine patient infection status. The expected result is Negative.  Fact Sheet for Patients: EntrepreneurPulse.com.au  Fact Sheet for Healthcare Providers: IncredibleEmployment.be  This test is not yet approved or cleared by the [place] FDA and  has been authorized for detection and/or diagnosis of SARS-CoV-2 by FDA under an Emergency Use Authorization (EUA).  This EUA will remain in effect (meaning this test can b e used) for the duration of  the COVID-19 declaration under Section  564(b)(1) of the Act, 21 U.S.C. section 360bbb-3(b)(1), unless the authorization is terminated or revoked sooner.     Influenza A by PCR NEGATIVE NEGATIVE Final   Influenza B by PCR NEGATIVE NEGATIVE Final    Comment: (NOTE) The Xpert Xpress SARS-CoV-2/FLU/RSV plus assay is intended as an aid in the diagnosis of influenza from Nasopharyngeal swab specimens and should not be used as a sole basis for treatment. Nasal washings and aspirates are unacceptable for Xpert Xpress SARS-CoV-2/FLU/RSV testing.  Fact Sheet for Patients: EntrepreneurPulse.com.au  Fact Sheet for Healthcare Providers: IncredibleEmployment.be  This test is not yet approved or cleared by the [place] and has been authorized for detection and/or diagnosis of SARS-CoV-2 by FDA under an Emergency Use Authorization (EUA). This EUA will remain in effect (meaning this test can be used) for the duration of the COVID-19 declaration under Section 564(b)(1) of the Act, 21 U.S.C. section 360bbb-3(b)(1), unless the authorization is terminated or revoked.  Performed at [facility], [place] [address], [place], [place] [address]           Radiology Studies: No results found.      Scheduled Meds:  amLODipine  10 mg Oral Daily   vitamin C  500 mg Oral Daily   aspirin EC  81 mg Oral Daily   atorvastatin  40 mg Oral q1800   brimonidine  1 drop Left Eye BID   enoxaparin (LOVENOX) injection  30 mg Subcutaneous Q24H   furosemide  40 mg Oral Daily   gabapentin  400 mg Oral BID   insulin aspart  0-5 Units Subcutaneous QHS   insulin aspart  0-9 Units Subcutaneous TID WC   ipratropium  2 spray Each Nare BID   latanoprost  1 drop Left Eye QHS   lisinopril  20 mg Oral Daily   metoprolol tartrate  12.5 mg Oral BID   polyethylene glycol  17 g Oral BID   potassium chloride  10 mEq Oral Daily   senna-docusate  1 tablet Oral BID   Vitamin D (Ergocalciferol)  50,000 Units  Oral Weekly   zinc sulfate  220 mg Oral Daily   Continuous Infusions:        Aline August, MD [facility] 06/20/2021, 7:53 AM

## 2021-06-20 NOTE — Progress Notes (Signed)
I was paged to the unit to be present with Erin Carr when she learned of her twin sister's passing from her niece by phone.  I provided support to [facility] and prayer.  I also spoke with her niece and her reverend to help coordinate support for her during this time.  [place], [place] Pager, [phone] 10:11 AM

## 2021-06-20 NOTE — Discharge Summary (Signed)
Physician Discharge Summary  Erin Carr:[id number] DOB: [birth year] DOA: 06/07/2021  PCP: Vincente Liberty, MD  Admit date: 06/07/2021 Discharge date: 06/20/2021  Admitted From: Home Disposition: SNF  Recommendations for Outpatient Follow-up:  Follow up with SNF provider at earliest convenience with repeat CBC/BMP in the next few days Recommend outpatient evaluation and follow-up by palliative care Outpatient follow-up with oncology and cardiology Follow up in ED if symptoms worsen or new appear   Home Health: No Equipment/Devices: None  Discharge Condition: Guarded CODE STATUS: Full Diet recommendation: Heart healthy/fluid restriction of up to 1500 cc a day/carb modified diet   Brief/Interim Summary: 87 year old female with history of unspecified CVA, right foot drop, non-STEMI, chronic diastolic CHF, NSCLC in remission status post radiotherapy, hypertension, diabetes mellitus type 2, asthma and hyperlipidemia presented with bilateral leg weakness along with urinary incontinence.  CT of the head showed no acute stroke, creatinine elevated at 1.46 and a UA had significant pyuria.  She was started on IV fluids and IV Rocephin.  PT recommended SNF placement.  Patient subsequently developed fever and congestion and tested positive for COVID-19.  No evidence of pneumonia.  Treated with 3 doses of IV remdesivir.  Currently medically stable for discharge.  She is currently on room air.  She will be discharged to SNF once bed is available.  Discharge Diagnoses:   Lower extremity weakness -Possibly infection related worsening of general functional decline. -Improving.  PT recommended SNF placement.  CT of the head was negative for acute abnormality. -Discharge to SNF once bed is available.   UTI -Insignificant growth on culture.  Treated with Rocephin and subsequently discontinued   COVID-19 infection -No evidence of pneumonia.  CRP not significantly elevated.  Patient is  vaccinated x2 -Status post 3 doses of remdesivir from 06/10/2021-06/12/2021.  Isolate for 10 days -Respiratory status currently stable and she is currently still on room air.   Stage IIIb CKD -Creatinine currently stable  Thrombocytopenia -Resolved  Leukopenia--resolved  Chronic diastolic CHF -Euvolemic.  Continue Lasix, lisinopril and metoprolol.  Continue compliance with diet and also fluid restriction.  Outpatient follow-up with cardiology.  History of unspecified CVA -Continue aspirin and statin   Diabetes mellitus type 2 with hyperglycemia -Carb modified diet.  Continue prior regimen.   Hyperlipidemia -Continue statin   History of NSCLC -Outpatient follow-up with oncology   Generalized deconditioning -Palliative care evaluated the patient.  Patient remains full code.  Recommend outpatient evaluation and follow-up by palliative care.     Discharge Instructions  Discharge Instructions     Amb Referral to Palliative Care   Complete by: As directed    Diet - low sodium heart healthy   Complete by: As directed    Diet Carb Modified   Complete by: As directed    Increase activity slowly   Complete by: As directed       Allergies as of 06/20/2021   No Known Allergies      Medication List     STOP taking these medications    metFORMIN 500 MG 24 hr tablet Commonly known as: GLUCOPHAGE-XR       TAKE these medications    albuterol 108 (90 Base) MCG/ACT inhaler Commonly known as: VENTOLIN HFA Inhale 1 puff into the lungs every 4 (four) hours as needed for wheezing or shortness of breath.   amLODipine 10 MG tablet Commonly known as: NORVASC Take 10 mg by mouth daily.   aspirin EC 81 MG tablet Take 81 mg by mouth daily.  atorvastatin 40 MG tablet Commonly known as: LIPITOR Take 1 tablet (40 mg total) by mouth daily at 6 PM.   brimonidine 0.2 % ophthalmic solution Commonly known as: ALPHAGAN Place 1 drop into the left eye 2 (two) times daily.    ergocalciferol 1.25 MG (50000 UT) capsule Commonly known as: VITAMIN D2 Take 50,000 Units by mouth once a week. On Monday   furosemide 20 MG tablet Commonly known as: LASIX Take 2 tablets (40 mg total) by mouth daily. What changed:  how much to take when to take this   gabapentin 100 MG capsule Commonly known as: NEURONTIN Take 2 capsules (200 mg total) by mouth 2 (two) times daily. What changed:  medication strength how much to take   latanoprost 0.005 % ophthalmic solution Commonly known as: XALATAN Place 1 drop into the left eye at bedtime.   lisinopril 20 MG tablet Commonly known as: ZESTRIL Take 20 mg by mouth 2 (two) times daily.   potassium chloride 10 MEQ tablet Commonly known as: KLOR-CON Take 10 mEq by mouth daily.       ASK your doctor about these medications    metoprolol tartrate 25 MG tablet Commonly known as: LOPRESSOR Take 0.5 tablets (12.5 mg total) by mouth 2 (two) times daily.        Follow-up Information     Vincente Liberty, MD Follow up.   Specialty: Pulmonary Disease Contact information: [place] [address] [phone]                No Known Allergies  Consultations: Palliative care   Procedures/Studies: DG Chest 2 View  Result Date: 06/07/2021 CLINICAL DATA:  Weakness history of right lung cancer EXAM: CHEST - 2 VIEW COMPARISON:  10/22/2017, CT chest 11/24/2020 FINDINGS: Bandlike opacity in the right upper lobe consistent with post treatment change. No acute airspace disease or effusion. Normal cardiomediastinal silhouette with aortic atherosclerosis. No pneumothorax IMPRESSION: No active cardiopulmonary disease. Bandlike opacity in the right upper lobe consistent with post treatment changes. Electronically Signed   By: Donavan Foil M.D.   On: 06/07/2021 19:16   CT Head Wo Contrast  Result Date: 06/07/2021 CLINICAL DATA:  Leg weakness.  Stroke suspected.  History of stroke. EXAM: CT HEAD  WITHOUT CONTRAST TECHNIQUE: Contiguous axial images were obtained from the base of the skull through the vertex without intravenous contrast. COMPARISON:  CT 10/22/2017.  MRI brain 10/22/2017 FINDINGS: Brain: No evidence of acute infarction, hemorrhage, hydrocephalus, extra-axial collection or mass lesion/mass effect. Mild diffuse cerebral atrophy. Mild ventricular dilatation consistent with central atrophy. Patchy low-attenuation changes in the deep white matter consistent small vessel ischemia. Vascular: Moderate intracranial arterial vascular calcifications. Skull: Calvarium appears intact. Sinuses/Orbits: Mucosal thickening in the paranasal sinuses. Possible air-fluid level in the left maxillary antrum. Mastoid air cells are clear. Other: None. IMPRESSION: 1. No acute intracranial abnormalities. Chronic atrophy and small vessel ischemic changes. 2. Inflammatory changes in the paranasal sinuses. Electronically Signed   By: Lucienne Capers M.D.   On: 06/07/2021 19:30      Subjective:  Patient is a poor historian.  No worsening shortness of breath, nausea or vomiting reported.  Discharge Exam: Vitals:   06/20/21 0424 06/20/21 0827  BP: 108/63 (!) 130/44  Pulse: 63 69  Resp: 15   Temp: 98.8 F (37.1 C)   SpO2: 100%     General exam: On room air. No distress. Poor historian.  Still slightly confused to time. Respiratory system: Decreased breath sounds  at bases bilaterally cardiovascular system: currently rate controlled; S1-S2 heard gastrointestinal system: Abdomen is mildly distended, soft and nontender.  Bowel sounds are heard Extremities: Trace lower extremity edema present; no clubbing    The results of significant diagnostics from this hospitalization (including imaging, microbiology, ancillary and laboratory) are listed below for reference.     Microbiology: Recent Results (from the past 240 hour(s))  SARS CORONAVIRUS 2 (TAT 6-24 HRS) Nasopharyngeal Nasopharyngeal Swab      Status: Abnormal   Collection Time: 06/10/21 11:48 AM   Specimen: Nasopharyngeal Swab  Result Value Ref Range Status   SARS Coronavirus 2 POSITIVE (A) NEGATIVE Final    Comment: (NOTE) SARS-CoV-2 target nucleic acids are DETECTED.  The SARS-CoV-2 RNA is generally detectable in upper and lower respiratory specimens during the acute phase of infection. Positive results are indicative of the presence of SARS-CoV-2 RNA. Clinical correlation with patient history and other diagnostic information is  necessary to determine patient infection status. Positive results do not rule out bacterial infection or co-infection with other viruses.  The expected result is Negative.  Fact Sheet for Patients: SugarRoll.be  Fact Sheet for Healthcare Providers: https://www.woods-mathews.com/  This test is not yet approved or cleared by the [place] FDA and  has been authorized for detection and/or diagnosis of SARS-CoV-2 by FDA under an Emergency Use Authorization (EUA). This EUA will remain  in effect (meaning this test can be used) for the duration of the COVID-19 declaration under Section 564(b)(1) of the Act, 21 U. S.C. section 360bbb-3(b)(1), unless the authorization is terminated or revoked sooner.   Performed at [facility], [place] [address] [place], [place] [address]   Resp Panel by RT-PCR (Flu A&B, Covid) Nasopharyngeal Swab     Status: Abnormal   Collection Time: 06/10/21  3:37 PM   Specimen: Nasopharyngeal Swab; Nasopharyngeal(NP) swabs in vial transport medium  Result Value Ref Range Status   SARS Coronavirus 2 by RT PCR POSITIVE (A) NEGATIVE Final    Comment: RESULT CALLED TO, READ BACK BY AND VERIFIED WITH: Coolidge Breeze RN [id number] 06/10/21 A BROWNING (NOTE) SARS-CoV-2 target nucleic acids are DETECTED.  The SARS-CoV-2 RNA is generally detectable in upper respiratory specimens during the acute phase of infection. Positive results  are indicative of the presence of the identified virus, but do not rule out bacterial infection or co-infection with other pathogens not detected by the test. Clinical correlation with patient history and other diagnostic information is necessary to determine patient infection status. The expected result is Negative.  Fact Sheet for Patients: EntrepreneurPulse.com.au  Fact Sheet for Healthcare Providers: IncredibleEmployment.be  This test is not yet approved or cleared by the [place] FDA and  has been authorized for detection and/or diagnosis of SARS-CoV-2 by FDA under an Emergency Use Authorization (EUA).  This EUA will remain in effect (meaning this test can b e used) for the duration of  the COVID-19 declaration under Section 564(b)(1) of the Act, 21 U.S.C. section 360bbb-3(b)(1), unless the authorization is terminated or revoked sooner.     Influenza A by PCR NEGATIVE NEGATIVE Final   Influenza B by PCR NEGATIVE NEGATIVE Final    Comment: (NOTE) The Xpert Xpress SARS-CoV-2/FLU/RSV plus assay is intended as an aid in the diagnosis of influenza from Nasopharyngeal swab specimens and should not be used as a sole basis for treatment. Nasal washings and aspirates are unacceptable for Xpert Xpress SARS-CoV-2/FLU/RSV testing.  Fact Sheet for Patients: EntrepreneurPulse.com.au  Fact Sheet for Healthcare Providers: IncredibleEmployment.be  This test is not yet approved or cleared by the [place] and has been authorized for detection and/or diagnosis of SARS-CoV-2 by FDA under an Emergency Use Authorization (EUA). This EUA will remain in effect (meaning this test can be used) for the duration of the COVID-19 declaration under Section 564(b)(1) of the Act, 21 U.S.C. section 360bbb-3(b)(1), unless the authorization is terminated or revoked.  Performed at [facility], [place] [address],  [place], [place] [address]      Labs: BNP (last 3 results) No results for input(s): BNP in the last 8760 hours. Basic Metabolic Panel: Recent Labs  Lab 06/16/21 0344 06/17/21 0353 06/18/21 0315 06/19/21 0647 06/20/21 0336  NA 139 136 136 133* 136  K 4.5 4.3 4.6 4.6 4.7  CL 104 101 102 99 100  CO2 30 28 29 28 30   GLUCOSE 308* 227* 185* 213* 160*  BUN 25* 25* 31* 27* 26*  CREATININE 1.14* 1.14* 1.42* 1.09* 1.29*  CALCIUM 9.2 9.0 9.1 9.1 9.2  MG 2.3  --   --   --   --    Liver Function Tests: Recent Labs  Lab 06/16/21 0344 06/17/21 0353 06/18/21 0315 06/19/21 0647 06/20/21 0336  AST 22 19 19 20 18   ALT 24 19 20 19 18   ALKPHOS 81 76 77 75 78  BILITOT 0.6 0.6 0.4 0.7 0.5  PROT 5.8* 5.5* 5.7* 5.5* 5.5*  ALBUMIN 3.0* 2.7* 2.8* 2.7* 2.9*   No results for input(s): LIPASE, AMYLASE in the last 168 hours. No results for input(s): AMMONIA in the last 168 hours. CBC: Recent Labs  Lab 06/14/21 0401 06/16/21 0344  WBC 4.4 5.5  NEUTROABS  --  3.3  HGB 10.7* 10.8*  HCT 34.6* 34.4*  MCV 85.2 85.1  PLT 202 263   Cardiac Enzymes: No results for input(s): CKTOTAL, CKMB, CKMBINDEX, TROPONINI in the last 168 hours. BNP: Invalid input(s): POCBNP CBG: Recent Labs  Lab 06/19/21 0751 06/19/21 1115 06/19/21 1658 06/19/21 2136 06/20/21 0813  GLUCAP 190* 261* 166* 181* 117*   D-Dimer Recent Labs    06/19/21 0647 06/20/21 0336  DDIMER 1.76* 0.76*   Hgb A1c Recent Labs    06/18/21 0315  HGBA1C 8.2*   Lipid Profile No results for input(s): CHOL, HDL, LDLCALC, TRIG, CHOLHDL, LDLDIRECT in the last 72 hours. Thyroid function studies No results for input(s): TSH, T4TOTAL, T3FREE, THYROIDAB in the last 72 hours.  Invalid input(s): FREET3 Anemia work up No results for input(s): VITAMINB12, FOLATE, FERRITIN, TIBC, IRON, RETICCTPCT in the last 72 hours. Urinalysis    Component Value Date/Time   COLORURINE YELLOW 06/07/2021 2202   APPEARANCEUR CLEAR 06/07/2021 2202    LABSPEC 1.010 06/07/2021 2202   PHURINE 7.0 06/07/2021 2202   GLUCOSEU NEGATIVE 06/07/2021 2202   HGBUR MODERATE (A) 06/07/2021 2202   BILIRUBINUR NEGATIVE 06/07/2021 2202   KETONESUR NEGATIVE 06/07/2021 2202   PROTEINUR 30 (A) 06/07/2021 2202   NITRITE NEGATIVE 06/07/2021 2202   LEUKOCYTESUR LARGE (A) 06/07/2021 2202   Sepsis Labs Invalid input(s): PROCALCITONIN,  WBC,  LACTICIDVEN Microbiology Recent Results (from the past 240 hour(s))  SARS CORONAVIRUS 2 (TAT 6-24 HRS) Nasopharyngeal Nasopharyngeal Swab     Status: Abnormal   Collection Time: 06/10/21 11:48 AM   Specimen: Nasopharyngeal Swab  Result Value Ref Range Status   SARS Coronavirus 2 POSITIVE (A) NEGATIVE Final    Comment: (NOTE) SARS-CoV-2 target nucleic acids are DETECTED.  The SARS-CoV-2 RNA is generally detectable in upper and lower  respiratory specimens during the acute phase of infection. Positive results are indicative of the presence of SARS-CoV-2 RNA. Clinical correlation with patient history and other diagnostic information is  necessary to determine patient infection status. Positive results do not rule out bacterial infection or co-infection with other viruses.  The expected result is Negative.  Fact Sheet for Patients: SugarRoll.be  Fact Sheet for Healthcare Providers: https://www.woods-mathews.com/  This test is not yet approved or cleared by the [place] FDA and  has been authorized for detection and/or diagnosis of SARS-CoV-2 by FDA under an Emergency Use Authorization (EUA). This EUA will remain  in effect (meaning this test can be used) for the duration of the COVID-19 declaration under Section 564(b)(1) of the Act, 21 U. S.C. section 360bbb-3(b)(1), unless the authorization is terminated or revoked sooner.   Performed at [facility], [place] [address] [place], [place] [address]   Resp Panel by RT-PCR (Flu A&B, Covid) Nasopharyngeal Swab      Status: Abnormal   Collection Time: 06/10/21  3:37 PM   Specimen: Nasopharyngeal Swab; Nasopharyngeal(NP) swabs in vial transport medium  Result Value Ref Range Status   SARS Coronavirus 2 by RT PCR POSITIVE (A) NEGATIVE Final    Comment: RESULT CALLED TO, READ BACK BY AND VERIFIED WITH: Coolidge Breeze RN [id number] 06/10/21 A BROWNING (NOTE) SARS-CoV-2 target nucleic acids are DETECTED.  The SARS-CoV-2 RNA is generally detectable in upper respiratory specimens during the acute phase of infection. Positive results are indicative of the presence of the identified virus, but do not rule out bacterial infection or co-infection with other pathogens not detected by the test. Clinical correlation with patient history and other diagnostic information is necessary to determine patient infection status. The expected result is Negative.  Fact Sheet for Patients: EntrepreneurPulse.com.au  Fact Sheet for Healthcare Providers: IncredibleEmployment.be  This test is not yet approved or cleared by the [place] FDA and  has been authorized for detection and/or diagnosis of SARS-CoV-2 by FDA under an Emergency Use Authorization (EUA).  This EUA will remain in effect (meaning this test can b e used) for the duration of  the COVID-19 declaration under Section 564(b)(1) of the Act, 21 U.S.C. section 360bbb-3(b)(1), unless the authorization is terminated or revoked sooner.     Influenza A by PCR NEGATIVE NEGATIVE Final   Influenza B by PCR NEGATIVE NEGATIVE Final    Comment: (NOTE) The Xpert Xpress SARS-CoV-2/FLU/RSV plus assay is intended as an aid in the diagnosis of influenza from Nasopharyngeal swab specimens and should not be used as a sole basis for treatment. Nasal washings and aspirates are unacceptable for Xpert Xpress SARS-CoV-2/FLU/RSV testing.  Fact Sheet for Patients: EntrepreneurPulse.com.au  Fact Sheet for Healthcare  Providers: IncredibleEmployment.be  This test is not yet approved or cleared by the [place] FDA and has been authorized for detection and/or diagnosis of SARS-CoV-2 by FDA under an Emergency Use Authorization (EUA). This EUA will remain in effect (meaning this test can be used) for the duration of the COVID-19 declaration under Section 564(b)(1) of the Act, 21 U.S.C. section 360bbb-3(b)(1), unless the authorization is terminated or revoked.  Performed at [facility], [place] [address], [place], [place] [address]      Time coordinating discharge: 35 minutes  SIGNED:   Aline August, MD  [facility] 06/20/2021, 11:31 AM

## 2021-06-20 NOTE — Care Management Important Message (Signed)
Important Message  Patient Details IM Letter placed in [employer]. Name: Erin Carr MRN: [id number] Date of Birth: 1930/12/01   Medicare Important Message Given:  Yes     [name] 06/20/2021, 10:55 AM

## 2021-06-20 NOTE — TOC Progression Note (Signed)
Transition of Care [facility]) - Progression Note    Patient Details  Name: Erin Carr MRN: [id number] Date of Birth: [birth year]  [facility]) CM/SW Contact  Purcell Mouton, RN Phone Number: 06/20/2021, 11:35 AM  Clinical Narrative:    Spoke with pt concerning discharge to [facility] today. Pt's niece Judeen Hammans was called to inform her of pt's discharge.    Expected Discharge Plan: Skilled Nursing Facility Barriers to Discharge: Other (must enter comment) (COVID test results, d/c summary)  Expected Discharge Plan and Services Expected Discharge Plan: [place]   Discharge Planning Services: CM Consult Post Acute Care Choice: [place] Living arrangements for the past 2 months: Single Family Home Expected Discharge Date: 06/20/21                                     Social Determinants of Health (SDOH) Interventions    Readmission Risk Interventions No flowsheet data found.

## 2021-06-22 DIAGNOSIS — E1140 Type 2 diabetes mellitus with diabetic neuropathy, unspecified: Secondary | ICD-10-CM | POA: Diagnosis not present

## 2021-06-22 DIAGNOSIS — L89626 Pressure-induced deep tissue damage of left heel: Secondary | ICD-10-CM | POA: Diagnosis not present

## 2021-06-22 DIAGNOSIS — L89152 Pressure ulcer of sacral region, stage 2: Secondary | ICD-10-CM | POA: Diagnosis not present

## 2021-06-23 DIAGNOSIS — U099 Post covid-19 condition, unspecified: Secondary | ICD-10-CM | POA: Diagnosis not present

## 2021-06-23 DIAGNOSIS — I5032 Chronic diastolic (congestive) heart failure: Secondary | ICD-10-CM | POA: Diagnosis not present

## 2021-06-23 DIAGNOSIS — R29898 Other symptoms and signs involving the musculoskeletal system: Secondary | ICD-10-CM | POA: Diagnosis not present

## 2021-06-23 DIAGNOSIS — I10 Essential (primary) hypertension: Secondary | ICD-10-CM | POA: Diagnosis not present

## 2021-06-27 DIAGNOSIS — L89152 Pressure ulcer of sacral region, stage 2: Secondary | ICD-10-CM | POA: Diagnosis not present

## 2021-06-27 DIAGNOSIS — L89626 Pressure-induced deep tissue damage of left heel: Secondary | ICD-10-CM | POA: Diagnosis not present

## 2021-06-27 DIAGNOSIS — E1140 Type 2 diabetes mellitus with diabetic neuropathy, unspecified: Secondary | ICD-10-CM | POA: Diagnosis not present

## 2021-06-28 DIAGNOSIS — K5900 Constipation, unspecified: Secondary | ICD-10-CM | POA: Diagnosis not present

## 2021-06-28 DIAGNOSIS — R051 Acute cough: Secondary | ICD-10-CM | POA: Diagnosis not present

## 2021-06-28 DIAGNOSIS — Z8616 Personal history of COVID-19: Secondary | ICD-10-CM | POA: Diagnosis not present

## 2021-06-28 DIAGNOSIS — R0989 Other specified symptoms and signs involving the circulatory and respiratory systems: Secondary | ICD-10-CM | POA: Diagnosis not present

## 2021-07-06 DIAGNOSIS — L89626 Pressure-induced deep tissue damage of left heel: Secondary | ICD-10-CM | POA: Diagnosis not present

## 2021-07-06 DIAGNOSIS — E1140 Type 2 diabetes mellitus with diabetic neuropathy, unspecified: Secondary | ICD-10-CM | POA: Diagnosis not present

## 2021-07-06 DIAGNOSIS — L89152 Pressure ulcer of sacral region, stage 2: Secondary | ICD-10-CM | POA: Diagnosis not present

## 2021-07-13 ENCOUNTER — Other Ambulatory Visit: Payer: Self-pay | Admitting: *Deleted

## 2021-07-13 DIAGNOSIS — L89626 Pressure-induced deep tissue damage of left heel: Secondary | ICD-10-CM | POA: Diagnosis not present

## 2021-07-13 DIAGNOSIS — E1140 Type 2 diabetes mellitus with diabetic neuropathy, unspecified: Secondary | ICD-10-CM | POA: Diagnosis not present

## 2021-07-13 NOTE — Patient Outreach (Signed)
Member screened for potential [facility] Care Management services.   Communication sent to [facility] SNF SW to inquire about anticipated transition plans and potential [facility] Care Management needs.   Will continue to follow while member resides in SNF.   Marthenia Rolling, MSN, RN,BSN [place] Acute Care Coordinator [phone] [facility]) [phone]  (Toll free office)

## 2021-07-20 ENCOUNTER — Other Ambulatory Visit: Payer: Self-pay | Admitting: *Deleted

## 2021-07-20 DIAGNOSIS — E1140 Type 2 diabetes mellitus with diabetic neuropathy, unspecified: Secondary | ICD-10-CM | POA: Diagnosis not present

## 2021-07-20 DIAGNOSIS — L89626 Pressure-induced deep tissue damage of left heel: Secondary | ICD-10-CM | POA: Diagnosis not present

## 2021-07-20 NOTE — Patient Outreach (Signed)
[facility] Post- Acute Care Coordinator follow up. Ms. Carapia resides in [facility] SNF.   Telephone call made to [facility] SNF SW who reports member's transition plan is to return home alone. States niece lives next door. Transition date not set yet.   Will continue to follow while member resides in SNF.   Marthenia Rolling, MSN, RN,BSN [place] Acute Care Coordinator [phone] [facility]) [phone]  (Toll free office)

## 2021-07-29 ENCOUNTER — Other Ambulatory Visit: Payer: Medicare Other | Admitting: Hospice

## 2021-07-29 ENCOUNTER — Other Ambulatory Visit: Payer: Self-pay

## 2021-07-29 ENCOUNTER — Other Ambulatory Visit: Payer: Self-pay | Admitting: *Deleted

## 2021-07-29 DIAGNOSIS — Z515 Encounter for palliative care: Secondary | ICD-10-CM

## 2021-07-29 DIAGNOSIS — N390 Urinary tract infection, site not specified: Secondary | ICD-10-CM | POA: Diagnosis not present

## 2021-07-29 DIAGNOSIS — U071 COVID-19: Secondary | ICD-10-CM | POA: Diagnosis not present

## 2021-07-29 DIAGNOSIS — I5022 Chronic systolic (congestive) heart failure: Secondary | ICD-10-CM

## 2021-07-29 NOTE — Progress Notes (Signed)
[facility] Consult Note Telephone: [phone]  Fax: [phone]  PATIENT NAME: Erin Carr [place] [id number] [phone] (home)  DOB: 06-17-1930 MRN: [id number]  PRIMARY CARE PROVIDER:    Vincente Liberty, MD,  [address] [place] [address] [phone]  REFERRING PROVIDER:   Vincente Liberty, [place],  [place] [address] [phone]  RESPONSIBLE PARTY:    Contact Information     Name Relation Home Work Mobile   Smith,Marie Niece   [phone]   Ronna Polio [phone]     Laverle Patter Niece [phone]   Christeen Douglas Niece   [phone]        I met face to face with patient at facility. Palliative Care was asked to follow this patient by consultation request of  Vincente Liberty, MD to address advance care planning, complex medical decision making and goals of care clarification.  Patient endorsed palliative service.  This is the initial visit.    ASSESSMENT AND / RECOMMENDATIONS:   Advance Care Planning: Our advance care planning conversation included a discussion about:    The value and importance of advance care planning  Difference between Hospice and Palliative care Exploration of goals of care in the event of a sudden injury or illness  Identification and preparation of a healthcare agent  Review and updating or creation of an  advance directive document . Decision not to resuscitate or to de-escalate disease focused treatments due to poor prognosis.  CODE STATUS: Discussion on ramifications and implications of CODE STATUS.  Patient affirmed she is a full code.  Goals of Care: Goals include to maximize quality of life and symptom management  I spent 20 minutes providing this initial consultation. More than 50% of the time in this consultation was spent on counseling patient and coordinating  communication. --------------------------------------------------------------------------------------------------------------------------------------  Symptom Management/Plan: Weakness: Continue PT OT for strengthening and gait training.  Balance of rest and performance activity.  Provide assistance as needed to ensure adequate oral intake UTI: Resolved with treatment with antibiotics.  Patient denies urinary symptoms. CHF: Continue with fluid restriction 1500 cc/day, no added salt, diuretic as ordered. Routine CBC BMP Follow up: Palliative care will continue to follow for complex medical decision making, advance care planning, and clarification of goals. Return 6 weeks or prn.Encouraged to call provider sooner with any concerns.   Family /Caregiver/Community Supports: Patient in SNF for ongoing care  HOSPICE ELIGIBILITY/DIAGNOSIS: TBD  Chief Complaint: Initial Palliative care visit  HISTORY OF PRESENT ILLNESS:  Erin Carr is a 87 y.o. year old female  with multiple medical conditions including acute on chronic weakness for which patient was recently admitted 7/5-7/18/2022; patient was also found to have COVID-19 virus infection and UTI, treated and discharged to SNF for ongoing care.  Weakness is mostly intermittent especially during ADLs impairing her independence.  Weakness is worse as the day goes by; ongoing PT OT is helpful.  History of CVA, right foot drop, non-STEMI, protein caloric malnutrition, chronic diastolic CHF, NSCLC in remission status post radiotherapy, hypertension, diabetes mellitus type 2, asthma and hyperlipidemia , COVID- 19 virus infection.  Patient denies pain/discomfort, no cough or respiratory distress. History obtained from review of EMR, discussion with primary team, caregiver, family and/or Erin Carr.  Review and summarization of Epic records shows history from other than patient. Rest of 10 point ROS asked and negative.    Review of lab tests/diagnostics    Results  for Erin, Carr (MRN [id number]) as of 07/29/2021 12:02  Ref. Range 06/20/2021 03:36  Sodium Latest Ref Range: 135 - 145 mmol/L 136  Potassium Latest Ref Range: 3.5 - 5.1 mmol/L 4.7  Chloride Latest Ref Range: 98 - 111 mmol/L 100  CO2 Latest Ref Range: 22 - 32 mmol/L 30  Glucose Latest Ref Range: 70 - 99 mg/dL 160 (H)  BUN Latest Ref Range: 8 - 23 mg/dL 26 (H)  Creatinine Latest Ref Range: 0.44 - 1.00 mg/dL 1.29 (H)  Calcium Latest Ref Range: 8.9 - 10.3 mg/dL 9.2  Anion gap Latest Ref Range: 5 - 15  6  Alkaline Phosphatase Latest Ref Range: 38 - 126 U/L 78  Albumin Latest Ref Range: 3.5 - 5.0 g/dL 2.9 (L)  AST Latest Ref Range: 15 - 41 U/L 18  ALT Latest Ref Range: 0 - 44 U/L 18  Total Protein Latest Ref Range: 6.5 - 8.1 g/dL 5.5 (L)  Total Bilirubin Latest Ref Range: 0.3 - 1.2 mg/dL 0.5  GFR, Estimated Latest Ref Range: >60 mL/min 40 (L)  Results for Erin, Carr (MRN [id number]) as of 07/29/2021 12:02  Ref. Range 06/16/2021 03:44  WBC Latest Ref Range: 4.0 - 10.5 K/uL 5.5  RBC Latest Ref Range: 3.87 - 5.11 MIL/uL 4.04  Hemoglobin Latest Ref Range: 12.0 - 15.0 g/dL 10.8 (L)  HCT Latest Ref Range: 36.0 - 46.0 % 34.4 (L)  MCV Latest Ref Range: 80.0 - 100.0 fL 85.1  MCH Latest Ref Range: 26.0 - 34.0 pg 26.7  MCHC Latest Ref Range: 30.0 - 36.0 g/dL 31.4  RDW Latest Ref Range: 11.5 - 15.5 % 13.6  Platelets Latest Ref Range: 150 - 400 K/uL 263  nRBC Latest Ref Range: 0.0 - 0.2 % 0.0    ROS General: NAD EYES: denies vision changes ENMT: denies dysphagia Cardiovascular: denies chest pain/discomfort Pulmonary: denies cough, denies SOB Abdomen: endorses good appetite, denies constipation/diarrhea GU: denies dysuria, urinary frequency MSK:  endorses weakness,  no falls reported Skin: denies rashes or wounds Neurological: denies pain, denies insomnia Psych: Endorses positive mood Heme/lymph/immuno: denies bruises, abnormal bleeding  Physical Exam: Height/Weight: 5  feet 2 inches/119 pounds Constitutional: NAD General: Well groomed, cooperative EYES: anicteric sclera, lids intact, no discharge  ENMT: Moist mucous membrane CV: S1 S2, RRR, no LE edema Pulmonary: LCTA, no increased work of breathing, no cough, Abdomen: active BS + 4 quadrants, soft and non tender GU: no suprapubic tenderness MSK: weakness, sarcopenia, limited ROM Skin: warm and dry, no rashes or wounds on visible skin Neuro:  weakness, otherwise non focal Psych: non-anxious affect Hem/lymph/immuno: no widespread bruising   PAST MEDICAL HISTORY:  Active Ambulatory Problems    Diagnosis Date Noted   Pelvic prolapse 12/16/2014   Neoplasm of uncertain behavior of right upper lobe of lung 12/01/2015   Positive PPD    Adenocarcinoma of right lung, stage 1 (HCC) 11/16/2016   Microcytic anemia 07/18/2017   CAP (community acquired pneumonia) 07/18/2017   Asthma, chronic, unspecified asthma severity, with acute exacerbation 07/18/2017   Type 2 diabetes mellitus without complication ([place]) 21/19/4174   Shortness of breath 08/09/2017   Acute CHF (congestive heart failure) ([place]) 10/22/2017   Foot drop 10/22/2017   Facial droop 10/22/2017   NSTEMI (non-ST elevated myocardial infarction) ([place]) 10/22/2017   Fall 10/22/2017   Leukocytosis 10/22/2017   Radiation pneumonitis ([place]) 10/22/2017   Foot drop, right    Overweight with body mass index (BMI) 25.0-29.9 04/03/2018   History of cystocele  04/03/2018   Candidiasis of vagina 04/03/2018   Diabetes mellitus ([place]) 04/03/2018   Hyperlipidemia 04/03/2018   Hypertensive disorder 04/03/2018   Postmenopausal bleeding 04/03/2018   Urinary incontinence 04/03/2018   UTI (urinary tract infection) 06/07/2021   Weakness of both legs 32/35/5732   Chronic systolic CHF (congestive heart failure) ([place]) 06/08/2021   History of lung cancer 06/08/2021   AKI (acute kidney injury) ([place]) 06/08/2021   Resolved Ambulatory Problems    Diagnosis Date Noted    Pleural effusion...RIGHT 08/09/2017   Past Medical History:  Diagnosis Date   Arthritis    Asthma    COPD (chronic obstructive pulmonary disease) ([place])    Diabetes mellitus without complication ([place])    Family history of adverse reaction to anesthesia    History of radiation therapy 12/19/16-12/27/16   Hypercholesteremia    Hypertension    Osteoporosis    Shortness of breath dyspnea    Tremors of nervous system    Urinary frequency    Vitamin D deficiency    Wears glasses     SOCIAL HX:  Social History   Tobacco Use   Smoking status: Former    Packs/day: 0.50    Years: 40.00    Pack years: 20.00    Types: Cigarettes    Quit date: 12/08/2001    Years since quitting: 19.6   Smokeless tobacco: Never   Tobacco comments:    quit about 15-16 years ago; 12/09/2015  Substance Use Topics   Alcohol use: No     FAMILY HX:  Family History  Problem Relation Age of Onset   Hypertension Father    Diabetes Father       ALLERGIES: No Known Allergies    PERTINENT MEDICATIONS:  Outpatient Encounter Medications as of 07/29/2021  Medication Sig   albuterol (PROVENTIL HFA;VENTOLIN HFA) 108 (90 Base) MCG/ACT inhaler Inhale 1 puff into the lungs every 4 (four) hours as needed for wheezing or shortness of breath.   amLODipine (NORVASC) 10 MG tablet Take 10 mg by mouth daily.   aspirin EC 81 MG tablet Take 81 mg by mouth daily.   atorvastatin (LIPITOR) 40 MG tablet Take 1 tablet (40 mg total) by mouth daily at 6 PM.   brimonidine (ALPHAGAN) 0.2 % ophthalmic solution Place 1 drop into the left eye 2 (two) times daily.   ergocalciferol (VITAMIN D2) 50000 units capsule Take 50,000 Units by mouth once a week. On Monday   furosemide (LASIX) 20 MG tablet Take 2 tablets (40 mg total) by mouth daily.   gabapentin (NEURONTIN) 100 MG capsule Take 2 capsules (200 mg total) by mouth 2 (two) times daily.   latanoprost (XALATAN) 0.005 % ophthalmic solution Place 1 drop into the left eye at bedtime.    lisinopril (PRINIVIL,ZESTRIL) 20 MG tablet Take 20 mg by mouth 2 (two) times daily.   metoprolol tartrate (LOPRESSOR) 25 MG tablet Take 0.5 tablets (12.5 mg total) by mouth 2 (two) times daily. (Patient taking differently: Take 25 mg by mouth 2 (two) times daily.)   potassium chloride (K-DUR) 10 MEQ tablet Take 10 mEq by mouth daily.   No facility-administered encounter medications on file as of 07/29/2021.     Thank you for the opportunity to participate in the care of Erin Carr.  The palliative care team will continue to follow. Please call our office at [phone] if we can be of additional assistance.   Note: Portions of this note were generated with [employer]. Dictation errors may occur  despite best attempts at proofreading.  Teodoro Spray, NP

## 2021-07-29 NOTE — Patient Outreach (Signed)
[facility] Post-Acute Care Coordinator. Ms. Kiernan resides in [facility].   Update received from [place] on 07/29/21.   Marthenia Rolling, MSN, RN,BSN [place] Acute Care Coordinator [phone] [facility]) [phone]  (Toll free office)

## 2021-08-01 DIAGNOSIS — E1140 Type 2 diabetes mellitus with diabetic neuropathy, unspecified: Secondary | ICD-10-CM | POA: Diagnosis not present

## 2021-08-02 NOTE — Patient Outreach (Signed)
[facility] Post Acute Care Coordinator follow up. Member screened for potential [facility] Care Management needs.   Update received from [place] indicating member is doing well. Plans to return home soon.  Will plan outreach to Ms. Horlacher to discuss [place] Management follow up.    Marthenia Rolling, MSN, RN,BSN [place] Acute Care Coordinator [phone] [facility]) [phone]  (Toll free office)

## 2021-08-03 DIAGNOSIS — E1140 Type 2 diabetes mellitus with diabetic neuropathy, unspecified: Secondary | ICD-10-CM | POA: Diagnosis not present

## 2021-08-17 DIAGNOSIS — I5040 Unspecified combined systolic (congestive) and diastolic (congestive) heart failure: Secondary | ICD-10-CM | POA: Diagnosis not present

## 2021-08-17 DIAGNOSIS — E1140 Type 2 diabetes mellitus with diabetic neuropathy, unspecified: Secondary | ICD-10-CM | POA: Diagnosis not present

## 2021-08-17 DIAGNOSIS — M6281 Muscle weakness (generalized): Secondary | ICD-10-CM | POA: Diagnosis not present

## 2021-08-17 DIAGNOSIS — Z8616 Personal history of COVID-19: Secondary | ICD-10-CM | POA: Diagnosis not present

## 2021-08-17 DIAGNOSIS — I130 Hypertensive heart and chronic kidney disease with heart failure and stage 1 through stage 4 chronic kidney disease, or unspecified chronic kidney disease: Secondary | ICD-10-CM | POA: Diagnosis not present

## 2021-08-17 DIAGNOSIS — J449 Chronic obstructive pulmonary disease, unspecified: Secondary | ICD-10-CM | POA: Diagnosis not present

## 2021-08-17 DIAGNOSIS — E46 Unspecified protein-calorie malnutrition: Secondary | ICD-10-CM | POA: Diagnosis not present

## 2021-08-17 DIAGNOSIS — L89626 Pressure-induced deep tissue damage of left heel: Secondary | ICD-10-CM | POA: Diagnosis not present

## 2021-08-17 DIAGNOSIS — E1165 Type 2 diabetes mellitus with hyperglycemia: Secondary | ICD-10-CM | POA: Diagnosis not present

## 2021-08-17 DIAGNOSIS — N1832 Chronic kidney disease, stage 3b: Secondary | ICD-10-CM | POA: Diagnosis not present

## 2021-08-18 DIAGNOSIS — I5032 Chronic diastolic (congestive) heart failure: Secondary | ICD-10-CM | POA: Diagnosis not present

## 2021-08-18 DIAGNOSIS — I130 Hypertensive heart and chronic kidney disease with heart failure and stage 1 through stage 4 chronic kidney disease, or unspecified chronic kidney disease: Secondary | ICD-10-CM | POA: Diagnosis not present

## 2021-08-18 DIAGNOSIS — M21371 Foot drop, right foot: Secondary | ICD-10-CM | POA: Diagnosis not present

## 2021-08-18 DIAGNOSIS — E1122 Type 2 diabetes mellitus with diabetic chronic kidney disease: Secondary | ICD-10-CM | POA: Diagnosis not present

## 2021-08-18 DIAGNOSIS — M48061 Spinal stenosis, lumbar region without neurogenic claudication: Secondary | ICD-10-CM | POA: Diagnosis not present

## 2021-08-18 DIAGNOSIS — M6281 Muscle weakness (generalized): Secondary | ICD-10-CM | POA: Diagnosis not present

## 2021-08-22 DIAGNOSIS — M21371 Foot drop, right foot: Secondary | ICD-10-CM | POA: Diagnosis not present

## 2021-08-22 DIAGNOSIS — M6281 Muscle weakness (generalized): Secondary | ICD-10-CM | POA: Diagnosis not present

## 2021-08-22 DIAGNOSIS — I5032 Chronic diastolic (congestive) heart failure: Secondary | ICD-10-CM | POA: Diagnosis not present

## 2021-08-22 DIAGNOSIS — E1122 Type 2 diabetes mellitus with diabetic chronic kidney disease: Secondary | ICD-10-CM | POA: Diagnosis not present

## 2021-08-22 DIAGNOSIS — I130 Hypertensive heart and chronic kidney disease with heart failure and stage 1 through stage 4 chronic kidney disease, or unspecified chronic kidney disease: Secondary | ICD-10-CM | POA: Diagnosis not present

## 2021-08-22 DIAGNOSIS — M48061 Spinal stenosis, lumbar region without neurogenic claudication: Secondary | ICD-10-CM | POA: Diagnosis not present

## 2021-08-23 ENCOUNTER — Other Ambulatory Visit: Payer: Self-pay | Admitting: *Deleted

## 2021-08-23 ENCOUNTER — Ambulatory Visit: Payer: Medicare Other | Admitting: Podiatry

## 2021-08-23 DIAGNOSIS — I5032 Chronic diastolic (congestive) heart failure: Secondary | ICD-10-CM | POA: Diagnosis not present

## 2021-08-23 DIAGNOSIS — E1122 Type 2 diabetes mellitus with diabetic chronic kidney disease: Secondary | ICD-10-CM | POA: Diagnosis not present

## 2021-08-23 DIAGNOSIS — I5022 Chronic systolic (congestive) heart failure: Secondary | ICD-10-CM

## 2021-08-23 DIAGNOSIS — I130 Hypertensive heart and chronic kidney disease with heart failure and stage 1 through stage 4 chronic kidney disease, or unspecified chronic kidney disease: Secondary | ICD-10-CM | POA: Diagnosis not present

## 2021-08-23 DIAGNOSIS — M6281 Muscle weakness (generalized): Secondary | ICD-10-CM | POA: Diagnosis not present

## 2021-08-23 DIAGNOSIS — M21371 Foot drop, right foot: Secondary | ICD-10-CM | POA: Diagnosis not present

## 2021-08-23 DIAGNOSIS — M48061 Spinal stenosis, lumbar region without neurogenic claudication: Secondary | ICD-10-CM | POA: Diagnosis not present

## 2021-08-23 NOTE — Patient Outreach (Signed)
[place] Coordinator follow up. Member screened for potential [facility] Care Management needs.   Erin Carr transitioned to home from [facility] SNF on Thursday, 08/18/21.   Telephone call made to Erin Carr at [phone]. Patient identifiers confirmed. Attempted to discuss [facility] Care Management follow up. Erin Carr asked that writer call her niece Erin Carr (lives next door to member [phone].  Erin Carr reports member has [facility] home health for PT/OT/SW services. Confirms Erin Carr lives alone and Erin Carr lives next door. Erin Carr is primary caregiver and states Erin Carr tells everyone to contact her.   Explained [place] Management services. Erin Carr is agreeable. Explained [facility] Care Management will not interfere or replace services provided by home health.   Erin Carr has medical history of CVA, right foot drop, NSTEMI, CHF, NSCLC in remission s/p radiotherapy, HTN, T2DM, asthma, and HLD.   Erin Carr was followed by [facility] palliative when in SNF. Message sent to [facility] palliative, if not already aware, that member is home from SNF.   Will make referral to [place] for complex case management.    Marthenia Rolling, MSN, RN,BSN [place] Acute Care Coordinator [phone] [facility]) [phone]  (Toll free office)

## 2021-08-24 ENCOUNTER — Encounter: Payer: Self-pay | Admitting: *Deleted

## 2021-08-24 ENCOUNTER — Other Ambulatory Visit: Payer: Self-pay | Admitting: *Deleted

## 2021-08-24 NOTE — Patient Outreach (Signed)
[facility]) Care Management  [place]  08/24/2021   Erin Carr 10/19/1930 [id number]   Referral Date:  9/20 Referral Source: Post Acute Care Coordinator Date of Admission: 7/18 from hospital to SNF Diagnosis:  UTI and Covid Date of Discharge: 9/15 from SNF Facility: [employer]: [facility]   Outreach attempt #1, successful to member and niece.  Identity verified.  This care manager introduced self and stated purpose of call.  [facility] care management services explained.    Social: Lives alone but niece lives next door and manages health care.  Report member is able to bathe and dress, has mobile meals daily for dinner, niece helps with other meals.  She uses walker, will also have wheelchair delivered, ramp already in place at the home.    Conditions: Per chart, member has recent history of UTI and Covid, also has history of HTN, CHF, Lung cancer, DM, and HLD.  Niece helps with daily monitoring of BP, blood sugar, HR, and weights.  Medications: Reviewed with member's niece, she helps with medication management.  Denies need for financial assistance.   Consent:  Agrees to [facility] services.  Encounter Medications:  Outpatient Encounter Medications as of 08/24/2021  Medication Sig Note   amLODipine (NORVASC) 10 MG tablet Take 10 mg by mouth daily.    aspirin EC 81 MG tablet Take 81 mg by mouth daily.    atorvastatin (LIPITOR) 40 MG tablet Take 1 tablet (40 mg total) by mouth daily at 6 PM.    brimonidine (ALPHAGAN) 0.2 % ophthalmic solution Place 1 drop into the left eye 2 (two) times daily. 06/08/2021: 1 dose as of 07/05   ergocalciferol (VITAMIN D2) 50000 units capsule Take 50,000 Units by mouth once a week. On Monday    furosemide (LASIX) 20 MG tablet Take 2 tablets (40 mg total) by mouth daily.    gabapentin (NEURONTIN) 100 MG capsule Take 2 capsules (200 mg total) by mouth 2 (two) times daily.    latanoprost (XALATAN) 0.005 % ophthalmic  solution Place 1 drop into the left eye at bedtime.    lisinopril (PRINIVIL,ZESTRIL) 20 MG tablet Take 20 mg by mouth 2 (two) times daily. 06/08/2021: 1 dose as of 07/05   metoprolol tartrate (LOPRESSOR) 25 MG tablet Take 0.5 tablets (12.5 mg total) by mouth 2 (two) times daily. (Patient taking differently: Take 25 mg by mouth 2 (two) times daily.) 06/08/2021: 1 dose as of 07/05   potassium chloride (K-DUR) 10 MEQ tablet Take 10 mEq by mouth daily.    albuterol (PROVENTIL HFA;VENTOLIN HFA) 108 (90 Base) MCG/ACT inhaler Inhale 1 puff into the lungs every 4 (four) hours as needed for wheezing or shortness of breath. (Patient not taking: Reported on 08/24/2021)    No facility-administered encounter medications on file as of 08/24/2021.    Functional Status:  In your present state of health, do you have any difficulty performing the following activities: 06/08/2021  Hearing? N  Vision? Y  Difficulty concentrating or making decisions? Y  Walking or climbing stairs? Y  Comment secondary to weakness  Dressing or bathing? Y  Doing errands, shopping? Y  Some recent data might be hidden    Fall/Depression Screening: Fall Risk  08/24/2021 06/28/2017 06/07/2017  Falls in the past year? 0 No No  Number falls in past yr: 0 - -  Injury with Fall? 0 - -  Risk for fall due to : No Fall Risks - -  Follow up Falls evaluation completed - -  PHQ 2/9 Scores 08/24/2021 06/28/2017 06/07/2017 02/08/2017 12/06/2016  PHQ - 2 Score 0 0 0 0 0    Assessment:   Care Plan Care Plan : General Plan of Care (Adult)  Updates made by Valente David, RN since 08/24/2021 12:00 AM     Problem: CHL AMB "PATIENT-SPECIFIC PROBLEM"      Long-Range Goal: Patient Stated   Start Date: 08/24/2021  Expected End Date: 02/20/2022  Priority: High  Note:   Current Barriers:  Knowledge Deficits related to plan of care for management of Covid and UTI  Chronic Disease Management support and education needs related to Covid and UTI  RNCM  Clinical Goal(s):  Patient will verbalize understanding of plan for management of Covid and UTI attend all scheduled medical appointments: PCP not experience hospital admission. Hospital Admissions in last 6 months = 2  through collaboration with RN Care manager, provider, and care team.   Interventions: 1:1 collaboration with primary care provider regarding development and update of comprehensive plan of care as evidenced by provider attestation and co-signature Inter-disciplinary care team collaboration (see longitudinal plan of care) Evaluation of current treatment plan related to  self management and patient's adherence to plan as established by provider   COVID:  (Status: New goal.) Screening for signs and symptoms of depression; , Discussed effects, symptoms, and management of "long COVID"';, and Assessed social determinant of health barriers;   UTI  (Status: New goal.) Evaluation of current treatment plan related to  Covid and UTI ,  self-management and patient's adherence to plan as established by provider. Discussed plans with patient for ongoing care management follow up and provided patient with direct contact information for care management team Advised patient to contact this RNCM with questions; Reviewed medications with patient and discussed importance of medication adherence; Reviewed scheduled/upcoming provider appointments including PCP;  Patient Goals/Self-Care Activities: Patient will attend all scheduled provider appointments Patient will continue to perform ADL's independently Patient will call provider office for new concerns or questions        Goals Addressed             This Visit's Progress    [facility] - Make and Keep All Appointments       Timeframe:  Short-Term Goal Priority:  Medium Start Date:         9/21                    Expected End Date:        12/21  Barriers: None                Follow Up Date 08/31/2021    - call to cancel if  needed - keep a calendar with appointment dates    Why is this important?   Part of staying healthy is seeing the doctor for follow-up care.  If you forget your appointments, there are some things you can do to stay on track.    Notes:   9/21 - Already has appointment scheduled with PCP on 10/25, niece will call tomorrow to schedule one for sooner.     [facility] - [place] My Quality of Life evidenced by member's stated ability to manage ADLs       Timeframe:  Long-Range Goal Priority:  High Start Date:         9/21                    Expected End Date:  02/21/2022  Barriers: Knowledge              Follow Up Date 08/31/2021    - discuss my treatment options with the doctor or nurse - make shared treatment decisions with doctor    Why is this important?   Having a long-term illness can be scary.  It can also be stressful for you and your caregiver.  These steps may help.    Notes:   9/21 - Working with [employer] for [facility], already started sessions for PT, OT, and RN.        Plan:  Follow-up: Patient agrees to Care Plan and Follow-up. Follow-up in 1 week(s).  Will notify PCP of [facility] involvement.    Valente David, [place], MSN [place] [phone]

## 2021-08-25 DIAGNOSIS — M21371 Foot drop, right foot: Secondary | ICD-10-CM | POA: Diagnosis not present

## 2021-08-25 DIAGNOSIS — I5032 Chronic diastolic (congestive) heart failure: Secondary | ICD-10-CM | POA: Diagnosis not present

## 2021-08-25 DIAGNOSIS — M48061 Spinal stenosis, lumbar region without neurogenic claudication: Secondary | ICD-10-CM | POA: Diagnosis not present

## 2021-08-25 DIAGNOSIS — M6281 Muscle weakness (generalized): Secondary | ICD-10-CM | POA: Diagnosis not present

## 2021-08-25 DIAGNOSIS — E1122 Type 2 diabetes mellitus with diabetic chronic kidney disease: Secondary | ICD-10-CM | POA: Diagnosis not present

## 2021-08-25 DIAGNOSIS — I130 Hypertensive heart and chronic kidney disease with heart failure and stage 1 through stage 4 chronic kidney disease, or unspecified chronic kidney disease: Secondary | ICD-10-CM | POA: Diagnosis not present

## 2021-08-30 DIAGNOSIS — E1122 Type 2 diabetes mellitus with diabetic chronic kidney disease: Secondary | ICD-10-CM | POA: Diagnosis not present

## 2021-08-30 DIAGNOSIS — I5032 Chronic diastolic (congestive) heart failure: Secondary | ICD-10-CM | POA: Diagnosis not present

## 2021-08-30 DIAGNOSIS — M48061 Spinal stenosis, lumbar region without neurogenic claudication: Secondary | ICD-10-CM | POA: Diagnosis not present

## 2021-08-30 DIAGNOSIS — M6281 Muscle weakness (generalized): Secondary | ICD-10-CM | POA: Diagnosis not present

## 2021-08-30 DIAGNOSIS — M21371 Foot drop, right foot: Secondary | ICD-10-CM | POA: Diagnosis not present

## 2021-08-30 DIAGNOSIS — I130 Hypertensive heart and chronic kidney disease with heart failure and stage 1 through stage 4 chronic kidney disease, or unspecified chronic kidney disease: Secondary | ICD-10-CM | POA: Diagnosis not present

## 2021-08-31 ENCOUNTER — Other Ambulatory Visit: Payer: Self-pay | Admitting: *Deleted

## 2021-08-31 DIAGNOSIS — M21371 Foot drop, right foot: Secondary | ICD-10-CM | POA: Diagnosis not present

## 2021-08-31 DIAGNOSIS — I5032 Chronic diastolic (congestive) heart failure: Secondary | ICD-10-CM | POA: Diagnosis not present

## 2021-08-31 DIAGNOSIS — I10 Essential (primary) hypertension: Secondary | ICD-10-CM

## 2021-08-31 DIAGNOSIS — E1122 Type 2 diabetes mellitus with diabetic chronic kidney disease: Secondary | ICD-10-CM | POA: Diagnosis not present

## 2021-08-31 DIAGNOSIS — M48061 Spinal stenosis, lumbar region without neurogenic claudication: Secondary | ICD-10-CM | POA: Diagnosis not present

## 2021-08-31 DIAGNOSIS — M6281 Muscle weakness (generalized): Secondary | ICD-10-CM | POA: Diagnosis not present

## 2021-08-31 DIAGNOSIS — I130 Hypertensive heart and chronic kidney disease with heart failure and stage 1 through stage 4 chronic kidney disease, or unspecified chronic kidney disease: Secondary | ICD-10-CM | POA: Diagnosis not present

## 2021-08-31 NOTE — Patient Outreach (Signed)
[facility]) Care Management  08/31/2021  Erin Carr 12/20/1929 [id number]   Weekly transition of care call placed to niece, state member is "good."  She is interested in having member get in home assistance, would like to apply for Medicaid.  Also state member may have been paying into a long term care policy, she will check and see if this accurate.  Will discuss further with [facility] social worker.  Denies any urgent concerns, encouraged to contact this care manager with questions.  Agrees to follow up within the next week.   Goals Addressed             This Visit's Progress    [facility] - Make and Keep All Appointments   On track    Timeframe:  Short-Term Goal Priority:  Medium Start Date:         9/21                    Expected End Date:        12/21  Barriers: None                Follow Up Date 08/31/2021    - call to cancel if needed - keep a calendar with appointment dates    Why is this important?   Part of staying healthy is seeing the doctor for follow-up care.  If you forget your appointments, there are some things you can do to stay on track.    Notes:   9/21 - Already has appointment scheduled with PCP on 10/25, niece will call tomorrow to schedule one for sooner.  9/28 - Attempted to schedule sooner appointment with current PCP, niece was having trouble.  Decided to establish with new provider, appointment scheduled for 10/10     [facility] - [place] My Quality of Life evidenced by member's stated ability to manage ADLs   On track    Timeframe:  Long-Range Goal Priority:  High Start Date:         9/21                    Expected End Date:          02/21/2022  Barriers: Knowledge              Follow Up Date 08/31/2021    - discuss my treatment options with the doctor or nurse - make shared treatment decisions with doctor    Why is this important?   Having a long-term illness can be scary.  It can also be stressful for you and your caregiver.   These steps may help.    Notes:   9/21 - Working with [facility] for [facility], already started sessions for PT, OT, and RN.  9/28 - Niece report member's sessions have been going well.  Therapy visited yesterday as well as scheduled for today       Valente David, Therapist, sports, MSN [place] Manager [phone]

## 2021-09-01 DIAGNOSIS — M21371 Foot drop, right foot: Secondary | ICD-10-CM | POA: Diagnosis not present

## 2021-09-01 DIAGNOSIS — I130 Hypertensive heart and chronic kidney disease with heart failure and stage 1 through stage 4 chronic kidney disease, or unspecified chronic kidney disease: Secondary | ICD-10-CM | POA: Diagnosis not present

## 2021-09-01 DIAGNOSIS — M6281 Muscle weakness (generalized): Secondary | ICD-10-CM | POA: Diagnosis not present

## 2021-09-01 DIAGNOSIS — I5032 Chronic diastolic (congestive) heart failure: Secondary | ICD-10-CM | POA: Diagnosis not present

## 2021-09-01 DIAGNOSIS — E1122 Type 2 diabetes mellitus with diabetic chronic kidney disease: Secondary | ICD-10-CM | POA: Diagnosis not present

## 2021-09-01 DIAGNOSIS — M48061 Spinal stenosis, lumbar region without neurogenic claudication: Secondary | ICD-10-CM | POA: Diagnosis not present

## 2021-09-06 DIAGNOSIS — I130 Hypertensive heart and chronic kidney disease with heart failure and stage 1 through stage 4 chronic kidney disease, or unspecified chronic kidney disease: Secondary | ICD-10-CM | POA: Diagnosis not present

## 2021-09-06 DIAGNOSIS — E1122 Type 2 diabetes mellitus with diabetic chronic kidney disease: Secondary | ICD-10-CM | POA: Diagnosis not present

## 2021-09-06 DIAGNOSIS — M6281 Muscle weakness (generalized): Secondary | ICD-10-CM | POA: Diagnosis not present

## 2021-09-06 DIAGNOSIS — M48061 Spinal stenosis, lumbar region without neurogenic claudication: Secondary | ICD-10-CM | POA: Diagnosis not present

## 2021-09-06 DIAGNOSIS — I5032 Chronic diastolic (congestive) heart failure: Secondary | ICD-10-CM | POA: Diagnosis not present

## 2021-09-06 DIAGNOSIS — M21371 Foot drop, right foot: Secondary | ICD-10-CM | POA: Diagnosis not present

## 2021-09-07 DIAGNOSIS — I130 Hypertensive heart and chronic kidney disease with heart failure and stage 1 through stage 4 chronic kidney disease, or unspecified chronic kidney disease: Secondary | ICD-10-CM | POA: Diagnosis not present

## 2021-09-07 DIAGNOSIS — M6281 Muscle weakness (generalized): Secondary | ICD-10-CM | POA: Diagnosis not present

## 2021-09-07 DIAGNOSIS — I5032 Chronic diastolic (congestive) heart failure: Secondary | ICD-10-CM | POA: Diagnosis not present

## 2021-09-07 DIAGNOSIS — M21371 Foot drop, right foot: Secondary | ICD-10-CM | POA: Diagnosis not present

## 2021-09-07 DIAGNOSIS — M48061 Spinal stenosis, lumbar region without neurogenic claudication: Secondary | ICD-10-CM | POA: Diagnosis not present

## 2021-09-07 DIAGNOSIS — E1122 Type 2 diabetes mellitus with diabetic chronic kidney disease: Secondary | ICD-10-CM | POA: Diagnosis not present

## 2021-09-08 ENCOUNTER — Other Ambulatory Visit: Payer: Self-pay | Admitting: *Deleted

## 2021-09-08 DIAGNOSIS — M21371 Foot drop, right foot: Secondary | ICD-10-CM | POA: Diagnosis not present

## 2021-09-08 DIAGNOSIS — M6281 Muscle weakness (generalized): Secondary | ICD-10-CM | POA: Diagnosis not present

## 2021-09-08 DIAGNOSIS — I130 Hypertensive heart and chronic kidney disease with heart failure and stage 1 through stage 4 chronic kidney disease, or unspecified chronic kidney disease: Secondary | ICD-10-CM | POA: Diagnosis not present

## 2021-09-08 DIAGNOSIS — M48061 Spinal stenosis, lumbar region without neurogenic claudication: Secondary | ICD-10-CM | POA: Diagnosis not present

## 2021-09-08 DIAGNOSIS — E1122 Type 2 diabetes mellitus with diabetic chronic kidney disease: Secondary | ICD-10-CM | POA: Diagnosis not present

## 2021-09-08 DIAGNOSIS — I5032 Chronic diastolic (congestive) heart failure: Secondary | ICD-10-CM | POA: Diagnosis not present

## 2021-09-08 NOTE — Patient Outreach (Signed)
[facility]) Care Management  09/08/2021  Erin Carr [birth year] [id number]   Weekly transition of care call placed to member and niece.  Member denies any pain or discomfort, improving with therapy sessions.  Denies any urgent concerns, encouraged to contact this care manager with questions.  Agrees to follow up within the next week.    Goals Addressed             This Visit's Progress    THN - Make and Keep All Appointments   On track    Timeframe:  Short-Term Goal Priority:  Medium Start Date:         9/21                    Expected End Date:        12/21  Barriers: None                Follow Up Date 08/31/2021    - call to cancel if needed - keep a calendar with appointment dates    Why is this important?   Part of staying healthy is seeing the doctor for follow-up care.  If you forget your appointments, there are some things you can do to stay on track.    Notes:   9/21 - Already has appointment scheduled with PCP on 10/25, niece will call tomorrow to schedule one for sooner.  9/28 - Attempted to schedule sooner appointment with current PCP, niece was having trouble.  Decided to establish with new provider, appointment scheduled for 10/10  10/6 - Niece report call back from current PCP however she will keep appointment with new provider.  If member is comfortable with new provider, all appointments with current provider will be canceled and records transferred.     THN - Matintain My Quality of Life evidenced by member's stated ability to manage ADLs   On track    Timeframe:  Long-Range Goal Priority:  High Start Date:         9/21                    Expected End Date:          02/21/2022  Barriers: Knowledge              Follow Up Date 09/15/2021   - discuss my treatment options with the doctor or nurse - make shared treatment decisions with doctor    Why is this important?   Having a long-term illness can be scary.  It can also  be stressful for you and your caregiver.  These steps may help.    Notes:   9/21 - Working with [facility] for [facility], already started sessions for PT, OT, and RN.  9/28 - Niece report member's sessions have been going well.  Therapy visited yesterday as well as scheduled for today  10/6 - Member state she has not had BM in the last 2 days, will start Mirilax for relief and discuss ongoing methods for consistent BM.  Contact information for [employer] (long term insurance) provided to niece, encouraged to call to inquire about coverage for in home aide assistance       Valente David, Therapist, sports, MSN [place] Manager [phone]

## 2021-09-12 ENCOUNTER — Other Ambulatory Visit: Payer: Self-pay

## 2021-09-12 ENCOUNTER — Ambulatory Visit (INDEPENDENT_AMBULATORY_CARE_PROVIDER_SITE_OTHER): Payer: Medicare Other | Admitting: Family

## 2021-09-12 ENCOUNTER — Encounter: Payer: Self-pay | Admitting: Family

## 2021-09-12 VITALS — BP 160/70 | HR 66 | Temp 97.5°F | Resp 16 | Ht 62.0 in | Wt 127.6 lb

## 2021-09-12 DIAGNOSIS — Z8673 Personal history of transient ischemic attack (TIA), and cerebral infarction without residual deficits: Secondary | ICD-10-CM | POA: Diagnosis not present

## 2021-09-12 DIAGNOSIS — I129 Hypertensive chronic kidney disease with stage 1 through stage 4 chronic kidney disease, or unspecified chronic kidney disease: Secondary | ICD-10-CM

## 2021-09-12 DIAGNOSIS — R399 Unspecified symptoms and signs involving the genitourinary system: Secondary | ICD-10-CM | POA: Diagnosis not present

## 2021-09-12 DIAGNOSIS — R32 Unspecified urinary incontinence: Secondary | ICD-10-CM

## 2021-09-12 DIAGNOSIS — E782 Mixed hyperlipidemia: Secondary | ICD-10-CM | POA: Diagnosis not present

## 2021-09-12 DIAGNOSIS — E559 Vitamin D deficiency, unspecified: Secondary | ICD-10-CM | POA: Diagnosis not present

## 2021-09-12 DIAGNOSIS — Z7689 Persons encountering health services in other specified circumstances: Secondary | ICD-10-CM

## 2021-09-12 DIAGNOSIS — L853 Xerosis cutis: Secondary | ICD-10-CM

## 2021-09-12 DIAGNOSIS — E1142 Type 2 diabetes mellitus with diabetic polyneuropathy: Secondary | ICD-10-CM | POA: Diagnosis not present

## 2021-09-12 DIAGNOSIS — N183 Hypertensive chronic kidney disease with stage 1 through stage 4 chronic kidney disease, or unspecified chronic kidney disease: Secondary | ICD-10-CM

## 2021-09-12 DIAGNOSIS — Z66 Do not resuscitate: Secondary | ICD-10-CM

## 2021-09-12 DIAGNOSIS — K5901 Slow transit constipation: Secondary | ICD-10-CM | POA: Diagnosis not present

## 2021-09-12 DIAGNOSIS — Z23 Encounter for immunization: Secondary | ICD-10-CM

## 2021-09-12 DIAGNOSIS — N3945 Continuous leakage: Secondary | ICD-10-CM

## 2021-09-12 DIAGNOSIS — I5022 Chronic systolic (congestive) heart failure: Secondary | ICD-10-CM | POA: Diagnosis not present

## 2021-09-12 DIAGNOSIS — Z85118 Personal history of other malignant neoplasm of bronchus and lung: Secondary | ICD-10-CM | POA: Diagnosis not present

## 2021-09-12 LAB — POCT URINALYSIS DIPSTICK
Bilirubin, UA: NEGATIVE
Glucose, UA: POSITIVE — AB
Nitrite, UA: NEGATIVE
Protein, UA: NEGATIVE
Spec Grav, UA: <=1.005 — AB (ref 1.010–1.025)
Urobilinogen, UA: 0.2 E.U./dL
pH, UA: 6.5 (ref 5.0–8.0)

## 2021-09-12 MED ORDER — POTASSIUM CHLORIDE ER 10 MEQ PO TBCR: 10.0000 meq | EXTENDED_RELEASE_TABLET | Freq: Every day | ORAL | 1 refills | Status: AC

## 2021-09-12 MED ORDER — METOPROLOL TARTRATE 25 MG PO TABS: 12.5000 mg | ORAL_TABLET | Freq: Two times a day (BID) | ORAL | 1 refills | Status: DC

## 2021-09-12 MED ORDER — ERGOCALCIFEROL 1.25 MG (50000 UT) PO CAPS
50000.0000 [IU] | ORAL_CAPSULE | ORAL | 2 refills | Status: AC
Start: 2021-09-12 — End: 2021-12-11

## 2021-09-12 MED ORDER — AMLODIPINE BESYLATE 10 MG PO TABS: 10.0000 mg | ORAL_TABLET | Freq: Every day | ORAL | 1 refills | Status: AC

## 2021-09-12 MED ORDER — POLYETHYLENE GLYCOL 3350 17 GM/SCOOP PO POWD: 17.0000 g | Freq: Every day | ORAL | 1 refills | Status: AC

## 2021-09-12 MED ORDER — ATORVASTATIN CALCIUM 40 MG PO TABS: 40.0000 mg | ORAL_TABLET | Freq: Every day | ORAL | 1 refills | Status: AC

## 2021-09-12 NOTE — Progress Notes (Signed)
Provider: Marlowe Sax FNP-C   Ismeal Heider, Nelda Bucks, NP  Patient Care Team: Kamilia Carollo, Nelda Bucks, NP as PCP - General (Family Medicine) Valente David, RN as [place] Management  Extended Emergency Contact Information Primary Emergency Contact: [place] of [place] Phone: [phone] Relation: Niece Secondary Emergency Contact: Armstead Peaks Address: [address]          [place], [place] [address] Johnnette Litter of [place] Phone: [phone] Relation: Sister  Code Status:  DNR  Goals of care: Advanced Directive information Advanced Directives 09/12/2021  Does Patient Have a Medical Advance Directive? No  Would patient like information on creating a medical advance directive? No - Patient declined     Chief Complaint  Patient presents with   Establish Care    New Patient.    HPI:  Pt is a 87 y.o. female seen today to [place] [facility] for medical management of chronic diseases.Has medical history of Hypertension,chronic diastolic congestive Heart Failure,chronic Kidney disease stage 3 a,Type 2 DM with Peripheral Neuropathy,Hyperlipidemia,right eye blindness, Aortic Atherosclerosis per CT scan done 11/24/2020, hx of CVA,Vitamin D deficiency,Adenocarcinoma of right lung ( NSCLC) in remission follows up with Oncologist,Asthma,unsteady gait ,right foot drop among other conditions  She is here with her niece who is her POA.states status post Hospital admission 06/07/2021 - 06/20/2021 for Urinary tract infection and Generalized weakness.she was discharged to [facility] facility for Rehab due to generalized weakness.she was in rehab for > 1 month discharged 3 weeks ago.No records for review.States has [facility] PT/ OT twice per week.Doing much better since discharged home.  Has constipation with no BM 3-4 days sometimes.Appetite is good.drinks plenty of water.Prune juice and laxative  tablets are effective.   Her CBG ranges from 130's - 190's with few readings in low 200's.daughter states Metformin was discontinued during hospital admission since she was not eating and blood sugars were normal.Wonders whether she should restart metformin.I've discussed getting blood work then determine need for metformin.   She lives by herself and daughter visit.  She requires assistance with meal preparation and some assistance with bathing and grooming.overall,manages her medication and her finances.No difficulties affording her medication.  Does not smoke or use smokeless tobacco.she does not drink any alcohol. Worked in [place] at [employer]   Would like to discuss goals of care.States does not want to be resuscitated in case a Cardiopulmonary event.she would like Comfort Care to be provided no ICU admission or intubation/ventilator or BiPap.She would like I.V fluids and antibiotic for defined trial period if indicated but no feeding tube. MOST form has been filled out today. Reviewed goals of care and filled MOST form between 10:20-10:50 am. Answered question/ concern from daughter/HCPOA.    Past Medical History:  Diagnosis Date   Adenocarcinoma of right lung, stage 1 ([place]) 11/16/2016   Arthritis    Asthma    COPD (chronic obstructive pulmonary disease) (HCC)    Diabetes mellitus without complication ([place])    Type II   Family history of adverse reaction to anesthesia    "aunt had PONV"   History of radiation therapy 12/19/16-12/27/16   SBRT to right upper lobe 54 Gy in 3 fractions   Hypercholesteremia    Hypertension    Osteoporosis    Positive PPD 12/16   PER PCP   Shortness of breath dyspnea    Tremors of nervous system    Left hand  Urinary frequency    Vitamin D deficiency    Wears glasses    Past Surgical History:  Procedure Laterality Date   ABDOMINAL HYSTERECTOMY     ANTERIOR AND POSTERIOR REPAIR N/A 12/16/2014   Procedure: ANTERIOR  (CYSTOCELE) AND POSTERIOR REPAIR (RECTOCELE);  Surgeon: Crawford Givens, MD;  Location: [facility];  Service: Gynecology;  Laterality: N/A;   APPENDECTOMY     BOWEL RESECTION     BREAST BIOPSY Left    COLONOSCOPY     VIDEO BRONCHOSCOPY WITH ENDOBRONCHIAL ULTRASOUND N/A 12/17/2015   Procedure: VIDEO BRONCHOSCOPY WITH ENDOBRONCHIAL ULTRASOUND;  Surgeon: Ivin Poot, MD;  [facility];  Service: Thoracic;  Laterality: N/A;    No Known Allergies  Allergies as of 09/12/2021   No Known Allergies      Medication List        Accurate as of September 12, 2021 12:58 PM. If you have any questions, ask your nurse or doctor.          STOP taking these medications    albuterol 108 (90 Base) MCG/ACT inhaler Commonly known as: VENTOLIN HFA Stopped by: Nelda Bucks Anayiah Howden, NP       TAKE these medications    amLODipine 10 MG tablet Commonly known as: NORVASC Take 1 tablet (10 mg total) by mouth daily.   aspirin EC 81 MG tablet Take 81 mg by mouth daily.   atorvastatin 40 MG tablet Commonly known as: LIPITOR Take 1 tablet (40 mg total) by mouth daily at 6 PM.   brimonidine 0.2 % ophthalmic solution Commonly known as: ALPHAGAN Place 1 drop into the left eye 2 (two) times daily.   docusate sodium 100 MG capsule Commonly known as: COLACE Take 100 mg by mouth at bedtime.   ergocalciferol 1.25 MG (50000 UT) capsule Commonly known as: VITAMIN D2 Take 1 capsule (50,000 Units total) by mouth once a week. On Monday   furosemide 20 MG tablet Commonly known as: LASIX Take 2 tablets (40 mg total) by mouth daily.   gabapentin 100 MG capsule Commonly known as: NEURONTIN Take 2 capsules (200 mg total) by mouth 2 (two) times daily.   latanoprost 0.005 % ophthalmic solution Commonly known as: XALATAN Place 1 drop into the left eye at bedtime.   lisinopril 20 MG tablet Commonly known as: ZESTRIL Take 20 mg by mouth 2 (two) times daily.   metoprolol tartrate 25 MG tablet Commonly  known as: LOPRESSOR Take 0.5 tablets (12.5 mg total) by mouth 2 (two) times daily.   polyethylene glycol powder 17 GM/SCOOP powder Commonly known as: GLYCOLAX/MIRALAX Take 17 g by mouth daily. Hold for loose stool Started by: Sandrea Hughs, NP   potassium chloride 10 MEQ tablet Commonly known as: KLOR-CON Take 1 tablet (10 mEq total) by mouth daily.        Review of Systems  Constitutional:  Negative for appetite change, chills, fatigue, fever and unexpected weight change.  HENT:  Positive for dental problem. Negative for congestion, ear discharge, ear pain, facial swelling, hearing loss, nosebleeds, postnasal drip, rhinorrhea, sinus pressure, sinus pain, sneezing, sore throat, tinnitus and trouble swallowing.        Wears dentures   Eyes:  Positive for visual disturbance. Negative for pain, discharge, redness and itching.       Glaucoma uses eye drops   Respiratory:  Negative for cough, chest tightness, shortness of breath and wheezing.   Cardiovascular:  Negative for chest pain, palpitations and leg swelling.  Gastrointestinal:  Negative for abdominal distention, abdominal pain, blood in stool, constipation, diarrhea, nausea and vomiting.  Endocrine: Negative for cold intolerance, heat intolerance, polydipsia, polyphagia and polyuria.  Genitourinary:  Positive for frequency. Negative for difficulty urinating, dysuria, flank pain and urgency.       Incontinent   Musculoskeletal:  Positive for gait problem. Negative for arthralgias, back pain, joint swelling, myalgias, neck pain and neck stiffness.  Skin:  Negative for color change, pallor and rash.       Left heel sore area previous bruised.   Neurological:  Negative for dizziness, syncope, speech difficulty, weakness, light-headedness, numbness and headaches.  Hematological:  Does not bruise/bleed easily.  Psychiatric/Behavioral:  Negative for agitation, behavioral problems, confusion, hallucinations, self-injury, sleep  disturbance and suicidal ideas. The patient is nervous/anxious.        Hand tremors worst when nervous/anxious    Immunization History  Administered Date(s) Administered   Fluad Quad(high Dose 65+) 09/12/2021   Influenza, High Dose Seasonal PF 11/06/2018   Influenza-Unspecified 10/14/2015   PFIZER(Purple Top)SARS-COV-2 Vaccination 02/03/2020, 03/02/2020   Pertinent  Health Maintenance Due  Topic Date Due   OPHTHALMOLOGY EXAM  Never done   DEXA SCAN  Never done   FOOT EXAM  10/20/2021   HEMOGLOBIN A1C  12/19/2021   INFLUENZA VACCINE  Completed   Fall Risk  09/12/2021 08/24/2021 06/28/2017 06/07/2017 02/08/2017  Falls in the past year? 0 0 No No Yes  Number falls in past yr: 0 0 - - 1  Injury with Fall? 0 0 - - No  Risk for fall due to : No Fall Risks No Fall Risks - - -  Follow up Falls evaluation completed Falls evaluation completed - - -   Functional Status Survey:    Vitals:   09/12/21 1038  BP: (!) 160/70  Pulse: 66  Resp: 16  Temp: (!) 97.5 F (36.4 C)  SpO2: 97%  Weight: 127 lb 9.6 oz (57.9 kg)  Height: '5\' 2"'  (1.575 m)   Body mass index is 23.34 kg/m. Physical Exam Vitals reviewed.  Constitutional:      General: She is not in acute distress.    Appearance: Normal appearance. She is normal weight. She is not ill-appearing or diaphoretic.  HENT:     Head: Normocephalic.     Right Ear: Tympanic membrane, ear canal and external ear normal. There is no impacted cerumen.     Left Ear: Tympanic membrane, ear canal and external ear normal. There is no impacted cerumen.     Nose: Nose normal. No congestion or rhinorrhea.     Mouth/Throat:     Mouth: Mucous membranes are moist.     Pharynx: Oropharynx is clear. No oropharyngeal exudate or posterior oropharyngeal erythema.  Eyes:     General: No scleral icterus.       Right eye: No discharge.        Left eye: No discharge.     Conjunctiva/sclera: Conjunctivae normal.     Comments: Right eye blind  Left eye Pupil round  and reactive to light   Neck:     Vascular: No carotid bruit.  Cardiovascular:     Rate and Rhythm: Normal rate and regular rhythm.     Pulses: Normal pulses.     Heart sounds: Murmur heard.    No friction rub. No gallop.  Pulmonary:     Effort: Pulmonary effort is normal. No respiratory distress.     Breath sounds: Normal breath sounds. No wheezing, rhonchi or rales.  Chest:     Chest wall: No tenderness.  Abdominal:     General: Bowel sounds are normal. There is no distension.     Palpations: Abdomen is soft. There is no mass.     Tenderness: There is no abdominal tenderness. There is no right CVA tenderness, left CVA tenderness, guarding or rebound.  Musculoskeletal:        General: No swelling or tenderness. Normal range of motion.     Cervical back: Normal range of motion. No rigidity or tenderness.     Right lower leg: No edema.     Left lower leg: No edema.     Comments: Unsteady gait   Lymphadenopathy:     Cervical: No cervical adenopathy.  Skin:    General: Skin is warm and dry.     Coloration: Skin is not pale.     Findings: No bruising, erythema, lesion or rash.     Comments: Left heel previous ulcer area dry and scaly.   Neurological:     Mental Status: She is alert and oriented to person, place, and time.     Cranial Nerves: No cranial nerve deficit.     Sensory: No sensory deficit.     Motor: No weakness.     Coordination: Coordination normal.     Gait: Gait abnormal.  Psychiatric:        Mood and Affect: Mood normal.        Speech: Speech normal.        Behavior: Behavior normal.        Thought Content: Thought content normal.        Judgment: Judgment normal.    Labs reviewed: Recent Labs    06/16/21 0344 06/17/21 0353 06/18/21 0315 06/19/21 0647 06/20/21 0336  NA 139   < > 136 133* 136  K 4.5   < > 4.6 4.6 4.7  CL 104   < > 102 99 100  CO2 30   < > '29 28 30  ' GLUCOSE 308*   < > 185* 213* 160*  BUN 25*   < > 31* 27* 26*  CREATININE 1.14*   < >  1.42* 1.09* 1.29*  CALCIUM 9.2   < > 9.1 9.1 9.2  MG 2.3  --   --   --   --    < > = values in this interval not displayed.   Recent Labs    06/18/21 0315 06/19/21 0647 06/20/21 0336  AST '19 20 18  ' ALT '20 19 18  ' ALKPHOS 77 75 78  BILITOT 0.4 0.7 0.5  PROT 5.7* 5.5* 5.5*  ALBUMIN 2.8* 2.7* 2.9*   Recent Labs    06/10/21 0505 06/11/21 0345 06/14/21 0401 06/16/21 0344  WBC 3.1* 3.5* 4.4 5.5  NEUTROABS 1.4* 1.8  --  3.3  HGB 10.0* 11.0* 10.7* 10.8*  HCT 32.3* 35.2* 34.6* 34.4*  MCV 87.3 86.3 85.2 85.1  PLT 135* 146* 202 263   No results found for: TSH Lab Results  Component Value Date   HGBA1C 8.2 (H) 06/18/2021   Lab Results  Component Value Date   CHOL 106 10/23/2017   HDL 43 10/23/2017   LDLCALC 49 10/23/2017   TRIG 68 10/23/2017   CHOLHDL 2.5 10/23/2017    Significant Diagnostic Results in last 30 days:  No results found.  Assessment/Plan  1. Need for influenza vaccination Afebrile. Flut shot administered by CMA no acute reaction reported.  - Flu Vaccine QUAD High Dose(Fluad)  2.  Encounter to establish care  Immunization reviewed due for Tdap,Sinigrix vaccine and COVID19 1st boost vaccine but thinks had Tdap and sinigrin vaccine with previous PCP will wait for medical records from previous PCP. Medication and labs reviewed. patient counselled regarding yearly exam,diet, regular sustained exercise for at least 30 minutes x 3 /week, recommended schedule for routine labs. Fall screening. Goals of care discussion done.  3. Chronic systolic CHF (congestive heart failure) (HCC) No signs of fluid overload. Continue on Furosemide  - potassium chloride (KLOR-CON) 10 MEQ tablet; Take 1 tablet (10 mEq total) by mouth daily.  Dispense: 90 tablet; Refill: 1  4. DNR (do not resuscitate) Reviewed goals of care over telephone with daughter Adolph Pollack. Social worker present during this conversation. Pt is DNR. No further hospitalization for now. Iv fluids and  antibiotic for defined trial period if indicated. No feeding tube. MOST form has been filled out today. Reviewed goals of care and filled MOST form between 11:20-11:50 am. Answered question/ concern from daughter/HCPOA. - Do not attempt resuscitation (DNR)  5. Mixed hyperlipidemia Continue on Furosemide 40 mg tablet  - Lipid panel; Future - atorvastatin (LIPITOR) 40 MG tablet; Take 1 tablet (40 mg total) by mouth daily at 6 PM.  Dispense: 90 tablet; Refill: 1  6. DM type 2 with diabetic peripheral neuropathy (HCC) Lab Results  Component Value Date   HGBA1C 8.2 (H) 06/18/2021  Continue on ASA and Atorvastatin  - on ACE inhibitor for renal protection  - continue on Gabapentin for Neuropathy - continue to follow up with Ophthalmology for eye exam  - follow up with Podiatrist for foot exam  - CBC with Differential/Platelet; Future - CMP with eGFR([facility]); Future - TSH; Future - Hemoglobin A1c; Future  7. Vitamin D deficiency Continue on vitamin D supplement  - ergocalciferol (VITAMIN D2) 1.25 MG (50000 UT) capsule; Take 1 capsule (50,000 Units total) by mouth once a week. On Monday  Dispense: 4 capsule; Refill: 2 - will check Vitamin D level with next lab work  8. Symptoms of urinary tract infection Reports incontinence with frequency. - POCT urinalysis dipstick indicates yellow clear urine positive for ketones,moderate blood and large Leukocytes 3 + but negative for Nitrites.  Will send urine for culture Made aware culture results will return in 3 days then will call with results  Advised to notify provider if symptoms worsen or running any fever   9. Slow transit constipation - encouraged to increase fiber in diet  - increase water intake to 6-8 glasses of water daily and exercise as tolerated  - continue on colace,Prune Juice and laxative as needed - start on Miralax one box of samples given. - polyethylene glycol powder (GLYCOLAX/MIRALAX) 17 GM/SCOOP powder; Take 17 g by mouth  daily. Hold for loose stool  Dispense: 3350 g; Refill: 1  10. History of CVA (cerebrovascular accident) Continue to control high risk factors   11. History of lung cancer NSCLC follows up with Oncologist   12.Benign hypertension with CKD (chronic kidney disease) stage III (HCC) B/p elevated today. Medication refilled  Continue on Lisinopril,metoprolol,furosemide and amlodipine   13. Urinary incontinence, unspecified type - Urine specimen to rule out UTI as above  - continue with peri- care  - water intake 6-8 glasses daily  - Urine Culture  14.  Dry skin on left heel  Left heel dry scaly skin.Advised to apply moisturize at  least twice daily and prevent putting pressure on heel to prevent skin breakdown. Consider floating heel on  pillow when lying on the supine position.   Family/ staff Communication: Reviewed plan of care with patient and daughter verbalized understanding   Labs/tests ordered:  - CBC with Differential/Platelet - CMP with eGFR([facility]) - TSH - Hgb A1C - Lipid panel - POCT urinalysis dipstick  - Urine Culture  Next Appointment : 6 months for medical management of chronic issues.Fasting Labs in one week or sooner.     Sandrea Hughs, NP

## 2021-09-12 NOTE — Patient Instructions (Signed)
-   continue on current medication  - continue with Physical therapy

## 2021-09-13 DIAGNOSIS — I5032 Chronic diastolic (congestive) heart failure: Secondary | ICD-10-CM | POA: Diagnosis not present

## 2021-09-13 DIAGNOSIS — E1122 Type 2 diabetes mellitus with diabetic chronic kidney disease: Secondary | ICD-10-CM | POA: Diagnosis not present

## 2021-09-13 DIAGNOSIS — M6281 Muscle weakness (generalized): Secondary | ICD-10-CM | POA: Diagnosis not present

## 2021-09-13 DIAGNOSIS — I130 Hypertensive heart and chronic kidney disease with heart failure and stage 1 through stage 4 chronic kidney disease, or unspecified chronic kidney disease: Secondary | ICD-10-CM | POA: Diagnosis not present

## 2021-09-13 DIAGNOSIS — M48061 Spinal stenosis, lumbar region without neurogenic claudication: Secondary | ICD-10-CM | POA: Diagnosis not present

## 2021-09-13 DIAGNOSIS — M21371 Foot drop, right foot: Secondary | ICD-10-CM | POA: Diagnosis not present

## 2021-09-14 ENCOUNTER — Telehealth: Payer: Self-pay

## 2021-09-14 DIAGNOSIS — I130 Hypertensive heart and chronic kidney disease with heart failure and stage 1 through stage 4 chronic kidney disease, or unspecified chronic kidney disease: Secondary | ICD-10-CM | POA: Diagnosis not present

## 2021-09-14 DIAGNOSIS — M6281 Muscle weakness (generalized): Secondary | ICD-10-CM | POA: Diagnosis not present

## 2021-09-14 DIAGNOSIS — M21371 Foot drop, right foot: Secondary | ICD-10-CM | POA: Diagnosis not present

## 2021-09-14 DIAGNOSIS — I5032 Chronic diastolic (congestive) heart failure: Secondary | ICD-10-CM | POA: Diagnosis not present

## 2021-09-14 DIAGNOSIS — E1122 Type 2 diabetes mellitus with diabetic chronic kidney disease: Secondary | ICD-10-CM | POA: Diagnosis not present

## 2021-09-14 DIAGNOSIS — M48061 Spinal stenosis, lumbar region without neurogenic claudication: Secondary | ICD-10-CM | POA: Diagnosis not present

## 2021-09-14 LAB — URINE CULTURE
MICRO NUMBER:: 12484741
Result:: NO GROWTH
SPECIMEN QUALITY:: ADEQUATE

## 2021-09-14 NOTE — Telephone Encounter (Signed)
   Telephone encounter was:  Successful.  09/14/2021 Name: Erin Carr MRN: [id number] DOB: [birth year]  Erin Carr is a 87 y.o. year old female who is a primary care patient of Ngetich, Dinah C, NP . The community resource team was consulted for assistance with  Medicaid.  Care guide performed the following interventions: Spoke with patient's niece [name] confirmed email [email or web address].  Emailed information for [employer]. Letter saved in Epic.  Follow Up Plan:  Care guide will follow up with patient by phone over the next 7 days  Malissa Slay, AAS Paralegal, [place] Management  [address], [place] [address] ??[email or web address]  ?? [phone]   [email or web address]

## 2021-09-15 ENCOUNTER — Telehealth: Payer: Self-pay

## 2021-09-15 ENCOUNTER — Other Ambulatory Visit: Payer: Self-pay | Admitting: *Deleted

## 2021-09-15 NOTE — Telephone Encounter (Signed)
   Telephone encounter was:  Successful.  09/15/2021 Name: Erin Carr MRN: [id number] DOB: [birth year]  Erin Carr is a 87 y.o. year old female who is a primary care patient of Ngetich, Dinah C, NP . The community resource team was consulted for assistance with  Medicaid application information.  Care guide performed the following interventions:  Received confirmation email from patient's niece [name].  Follow Up Plan:  No further follow up planned at this time. The patient has been provided with needed resources.  Nahun Kronberg, AAS Paralegal, [place] Management  [address], [place] [address] ??[email or web address]  ?? [phone]   [email or web address]

## 2021-09-15 NOTE — Patient Outreach (Signed)
[facility]) Care Management  09/15/2021  BRADLEIGH SONNEN 18-Apr-1930 [id number]   Weekly transition of care call placed to member/niece.  Denies any urgent concerns, encouraged to contact this care manager with questions.  Agrees to follow up within the next week.   Goals Addressed             This Visit's Progress    THN - Make and Keep All Appointments   On track    Timeframe:  Short-Term Goal Priority:  Medium Start Date:         9/21                    Expected End Date:        12/21  Barriers: None                Follow Up Date 08/31/2021    - call to cancel if needed - keep a calendar with appointment dates    Why is this important?   Part of staying healthy is seeing the doctor for follow-up care.  If you forget your appointments, there are some things you can do to stay on track.    Notes:   9/21 - Already has appointment scheduled with PCP on 10/25, niece will call tomorrow to schedule one for sooner.  9/28 - Attempted to schedule sooner appointment with current PCP, niece was having trouble.  Decided to establish with new provider, appointment scheduled for 10/10  10/6 - Niece report call back from current PCP however she will keep appointment with new provider.  If member is comfortable with new provider, all appointments with current provider will be canceled and records transferred.  10/13 - Confirmed appointment with new PCP completed.  They have decided to keep new provider.  Only medication change was to add Mirilax daily.  She will have blood work done tomorrow to review A1C and kidney function, will follow up in 6 months     THN - Matintain My Quality of Life evidenced by member's stated ability to manage ADLs   On track    Timeframe:  Long-Range Goal Priority:  High Start Date:         9/21                    Expected End Date:          02/21/2022  Barriers: Knowledge              Follow Up Date 09/15/2021   - discuss my  treatment options with the doctor or nurse - make shared treatment decisions with doctor    Why is this important?   Having a long-term illness can be scary.  It can also be stressful for you and your caregiver.  These steps may help.    Notes:   9/21 - Working with [facility] for [facility], already started sessions for PT, OT, and RN.  9/28 - Niece report member's sessions have been going well.  Therapy visited yesterday as well as scheduled for today  10/6 - Member state she has not had BM in the last 2 days, will start Mirilax for relief and discuss ongoing methods for consistent BM.  Contact information for [employer] (long term insurance) provided to niece, encouraged to call to inquire about coverage for in home aide assistance  10/13 - Confirmed Mirilax has been prescribed for constipation relief.  Member has been able to continue to perform  ADLs with assistance       Valente David, RN, MSN [place] [phone]

## 2021-09-16 ENCOUNTER — Other Ambulatory Visit: Payer: Self-pay

## 2021-09-16 ENCOUNTER — Other Ambulatory Visit: Payer: Medicare Other

## 2021-09-16 DIAGNOSIS — E1142 Type 2 diabetes mellitus with diabetic polyneuropathy: Secondary | ICD-10-CM

## 2021-09-16 DIAGNOSIS — E782 Mixed hyperlipidemia: Secondary | ICD-10-CM | POA: Diagnosis not present

## 2021-09-17 DIAGNOSIS — Z970 Presence of artificial eye: Secondary | ICD-10-CM | POA: Diagnosis not present

## 2021-09-17 DIAGNOSIS — Z85118 Personal history of other malignant neoplasm of bronchus and lung: Secondary | ICD-10-CM | POA: Diagnosis not present

## 2021-09-17 DIAGNOSIS — I252 Old myocardial infarction: Secondary | ICD-10-CM | POA: Diagnosis not present

## 2021-09-17 DIAGNOSIS — E785 Hyperlipidemia, unspecified: Secondary | ICD-10-CM | POA: Diagnosis not present

## 2021-09-17 DIAGNOSIS — J449 Chronic obstructive pulmonary disease, unspecified: Secondary | ICD-10-CM | POA: Diagnosis not present

## 2021-09-17 DIAGNOSIS — D509 Iron deficiency anemia, unspecified: Secondary | ICD-10-CM | POA: Diagnosis not present

## 2021-09-17 DIAGNOSIS — Z8673 Personal history of transient ischemic attack (TIA), and cerebral infarction without residual deficits: Secondary | ICD-10-CM | POA: Diagnosis not present

## 2021-09-17 DIAGNOSIS — M48061 Spinal stenosis, lumbar region without neurogenic claudication: Secondary | ICD-10-CM | POA: Diagnosis not present

## 2021-09-17 DIAGNOSIS — N39498 Other specified urinary incontinence: Secondary | ICD-10-CM | POA: Diagnosis not present

## 2021-09-17 DIAGNOSIS — Z7982 Long term (current) use of aspirin: Secondary | ICD-10-CM | POA: Diagnosis not present

## 2021-09-17 DIAGNOSIS — M810 Age-related osteoporosis without current pathological fracture: Secondary | ICD-10-CM | POA: Diagnosis not present

## 2021-09-17 DIAGNOSIS — Z9181 History of falling: Secondary | ICD-10-CM | POA: Diagnosis not present

## 2021-09-17 DIAGNOSIS — I130 Hypertensive heart and chronic kidney disease with heart failure and stage 1 through stage 4 chronic kidney disease, or unspecified chronic kidney disease: Secondary | ICD-10-CM | POA: Diagnosis not present

## 2021-09-17 DIAGNOSIS — E1122 Type 2 diabetes mellitus with diabetic chronic kidney disease: Secondary | ICD-10-CM | POA: Diagnosis not present

## 2021-09-17 DIAGNOSIS — I5032 Chronic diastolic (congestive) heart failure: Secondary | ICD-10-CM | POA: Diagnosis not present

## 2021-09-17 DIAGNOSIS — M6281 Muscle weakness (generalized): Secondary | ICD-10-CM | POA: Diagnosis not present

## 2021-09-17 DIAGNOSIS — M21371 Foot drop, right foot: Secondary | ICD-10-CM | POA: Diagnosis not present

## 2021-09-17 DIAGNOSIS — Z87440 Personal history of urinary (tract) infections: Secondary | ICD-10-CM | POA: Diagnosis not present

## 2021-09-17 DIAGNOSIS — E46 Unspecified protein-calorie malnutrition: Secondary | ICD-10-CM | POA: Diagnosis not present

## 2021-09-17 DIAGNOSIS — Z8616 Personal history of COVID-19: Secondary | ICD-10-CM | POA: Diagnosis not present

## 2021-09-17 DIAGNOSIS — N1832 Chronic kidney disease, stage 3b: Secondary | ICD-10-CM | POA: Diagnosis not present

## 2021-09-17 DIAGNOSIS — H409 Unspecified glaucoma: Secondary | ICD-10-CM | POA: Diagnosis not present

## 2021-09-17 DIAGNOSIS — E1140 Type 2 diabetes mellitus with diabetic neuropathy, unspecified: Secondary | ICD-10-CM | POA: Diagnosis not present

## 2021-09-17 LAB — LIPID PANEL
Cholesterol: 110 mg/dL (ref <200)
HDL: 48 mg/dL — ABNORMAL LOW (ref >=50)
LDL Cholesterol (Calc): 46 mg/dL (calc)
Non-HDL Cholesterol (Calc): 62 mg/dL (calc) (ref <130)
Total CHOL/HDL Ratio: 2.3 (calc) (ref <5.0)
Triglycerides: 75 mg/dL (ref <150)

## 2021-09-17 LAB — COMPLETE METABOLIC PANEL WITH GFR
AG Ratio: 1.3 (calc) (ref 1.0–2.5)
ALT: 6 U/L (ref 6–29)
AST: 11 U/L (ref 10–35)
Albumin: 3.9 g/dL (ref 3.6–5.1)
Alkaline phosphatase (APISO): 135 U/L (ref 37–153)
BUN/Creatinine Ratio: 14 (calc) (ref 6–22)
BUN: 16 mg/dL (ref 7–25)
CO2: 28 mmol/L (ref 20–32)
Calcium: 9.3 mg/dL (ref 8.6–10.4)
Chloride: 105 mmol/L (ref 98–110)
Creat: 1.14 mg/dL — ABNORMAL HIGH (ref 0.60–0.95)
Globulin: 3.1 g/dL (calc) (ref 1.9–3.7)
Glucose, Bld: 194 mg/dL — ABNORMAL HIGH (ref 65–99)
Potassium: 3.9 mmol/L (ref 3.5–5.3)
Sodium: 139 mmol/L (ref 135–146)
Total Bilirubin: 0.4 mg/dL (ref 0.2–1.2)
Total Protein: 7 g/dL (ref 6.1–8.1)
eGFR: 46 mL/min/{1.73_m2} — ABNORMAL LOW (ref >=60)

## 2021-09-17 LAB — CBC WITH DIFFERENTIAL/PLATELET
Absolute Monocytes: 683 cells/uL (ref 200–950)
Basophils Absolute: 40 cells/uL (ref 0–200)
Basophils Relative: 0.6 %
Eosinophils Absolute: 643 cells/uL — ABNORMAL HIGH (ref 15–500)
Eosinophils Relative: 9.6 %
HCT: 35.3 % (ref 35.0–45.0)
Hemoglobin: 10.7 g/dL — ABNORMAL LOW (ref 11.7–15.5)
Lymphs Abs: 1179 cells/uL (ref 850–3900)
MCH: 24.6 pg — ABNORMAL LOW (ref 27.0–33.0)
MCHC: 30.3 g/dL — ABNORMAL LOW (ref 32.0–36.0)
MCV: 81.1 fL (ref 80.0–100.0)
MPV: 9.7 fL (ref 7.5–12.5)
Monocytes Relative: 10.2 %
Neutro Abs: 4154 cells/uL (ref 1500–7800)
Neutrophils Relative %: 62 %
Platelets: 278 10*3/uL (ref 140–400)
RBC: 4.35 10*6/uL (ref 3.80–5.10)
RDW: 13 % (ref 11.0–15.0)
Total Lymphocyte: 17.6 %
WBC: 6.7 10*3/uL (ref 3.8–10.8)

## 2021-09-17 LAB — HEMOGLOBIN A1C
Hgb A1c MFr Bld: 10.2 % of total Hgb — ABNORMAL HIGH (ref <5.7)
Mean Plasma Glucose: 246 mg/dL
eAG (mmol/L): 13.6 mmol/L

## 2021-09-17 LAB — TSH: TSH: 2.48 mIU/L (ref 0.40–4.50)

## 2021-09-20 DIAGNOSIS — I130 Hypertensive heart and chronic kidney disease with heart failure and stage 1 through stage 4 chronic kidney disease, or unspecified chronic kidney disease: Secondary | ICD-10-CM | POA: Diagnosis not present

## 2021-09-20 DIAGNOSIS — I5032 Chronic diastolic (congestive) heart failure: Secondary | ICD-10-CM | POA: Diagnosis not present

## 2021-09-20 DIAGNOSIS — M21371 Foot drop, right foot: Secondary | ICD-10-CM | POA: Diagnosis not present

## 2021-09-20 DIAGNOSIS — M48061 Spinal stenosis, lumbar region without neurogenic claudication: Secondary | ICD-10-CM | POA: Diagnosis not present

## 2021-09-20 DIAGNOSIS — E1122 Type 2 diabetes mellitus with diabetic chronic kidney disease: Secondary | ICD-10-CM | POA: Diagnosis not present

## 2021-09-20 DIAGNOSIS — M6281 Muscle weakness (generalized): Secondary | ICD-10-CM | POA: Diagnosis not present

## 2021-09-21 ENCOUNTER — Other Ambulatory Visit: Payer: Self-pay

## 2021-09-21 MED ORDER — METFORMIN HCL 500 MG PO TABS: 500.0000 mg | ORAL_TABLET | Freq: Every day | ORAL | Status: DC

## 2021-09-21 NOTE — Progress Notes (Signed)
This encounter was created in error - please disregard.

## 2021-09-21 NOTE — Telephone Encounter (Signed)
Tried to add medication to chart , but it will not let me sign because of high warning. Medication pended and sent to Marlowe Sax, NP. Medication has not been sent to pharmacy because patient has supply of medication

## 2021-09-22 DIAGNOSIS — M21371 Foot drop, right foot: Secondary | ICD-10-CM | POA: Diagnosis not present

## 2021-09-22 DIAGNOSIS — M6281 Muscle weakness (generalized): Secondary | ICD-10-CM | POA: Diagnosis not present

## 2021-09-22 DIAGNOSIS — M48061 Spinal stenosis, lumbar region without neurogenic claudication: Secondary | ICD-10-CM | POA: Diagnosis not present

## 2021-09-22 DIAGNOSIS — I130 Hypertensive heart and chronic kidney disease with heart failure and stage 1 through stage 4 chronic kidney disease, or unspecified chronic kidney disease: Secondary | ICD-10-CM | POA: Diagnosis not present

## 2021-09-22 DIAGNOSIS — E1122 Type 2 diabetes mellitus with diabetic chronic kidney disease: Secondary | ICD-10-CM | POA: Diagnosis not present

## 2021-09-22 DIAGNOSIS — I5032 Chronic diastolic (congestive) heart failure: Secondary | ICD-10-CM | POA: Diagnosis not present

## 2021-09-23 ENCOUNTER — Other Ambulatory Visit: Payer: Self-pay | Admitting: *Deleted

## 2021-09-23 NOTE — Patient Outreach (Signed)
[facility]) Care Management  09/23/2021  Erin Carr 1929-12-11 [id number]   Weekly transition of care call placed to member/niece.  Denies any urgent concerns, encouraged to contact this care manager with questions.  Agrees to follow up within the next month.   Goals Addressed             This Visit's Progress    [facility] - Make and Keep All Appointments   On track    Timeframe:  Short-Term Goal Priority:  Medium Start Date:         9/21                    Expected End Date:        12/21  Barriers: None              - call to cancel if needed - keep a calendar with appointment dates    Why is this important?   Part of staying healthy is seeing the doctor for follow-up care.  If you forget your appointments, there are some things you can do to stay on track.    Notes:   9/21 - Already has appointment scheduled with PCP on 10/25, niece will call tomorrow to schedule one for sooner.  9/28 - Attempted to schedule sooner appointment with current PCP, niece was having trouble.  Decided to establish with new provider, appointment scheduled for 10/10  10/6 - Niece report call back from current PCP however she will keep appointment with new provider.  If member is comfortable with new provider, all appointments with current provider will be canceled and records transferred.  10/13 - Confirmed appointment with new PCP completed.  They have decided to keep new provider.  Only medication change was to add Mirilax daily.  She will have blood work done tomorrow to review A1C and kidney function, will follow up in 6 months  10/21 - Reviewed member's A1C with niece (10.2). Discussed follow up with PCP to review A1C since restarting Metformin.  Blood sugar today was 154     TH[facility] - [place] My Quality of Life evidenced by member's stated ability to manage ADLs   On track    Timeframe:  Long-Range Goal Priority:  High Start Date:         9/21                     Expected End Date:          02/21/2022  Barriers: Knowledge              Follow Up Date 09/15/2021   - discuss my treatment options with the doctor or nurse - make shared treatment decisions with doctor    Why is this important?   Having a long-term illness can be scary.  It can also be stressful for you and your caregiver.  These steps may help.    Notes:   9/21 - Working with [facility] for [facility], already started sessions for PT, OT, and RN.  9/28 - Niece report member's sessions have been going well.  Therapy visited yesterday as well as scheduled for today  10/6 - Member state she has not had BM in the last 2 days, will start Mirilax for relief and discuss ongoing methods for consistent BM.  Contact information for [employer] (long term insurance) provided to niece, encouraged to call to inquire about coverage for in home aide assistance  10/13 -  Confirmed Mirilax has been prescribed for constipation relief.  Member has been able to continue to perform ADLs with assistance  10/21 - Per niece, member continues to progress with goal toward independence       Valente David, Therapist, sports, MSN [place] Manager [phone]

## 2021-09-27 ENCOUNTER — Encounter: Payer: Self-pay | Admitting: Podiatry

## 2021-09-27 ENCOUNTER — Ambulatory Visit (INDEPENDENT_AMBULATORY_CARE_PROVIDER_SITE_OTHER): Payer: Medicare Other | Admitting: Podiatry

## 2021-09-27 ENCOUNTER — Other Ambulatory Visit: Payer: Self-pay

## 2021-09-27 DIAGNOSIS — M79674 Pain in right toe(s): Secondary | ICD-10-CM

## 2021-09-27 DIAGNOSIS — L89620 Pressure ulcer of left heel, unstageable: Secondary | ICD-10-CM

## 2021-09-27 DIAGNOSIS — M79675 Pain in left toe(s): Secondary | ICD-10-CM | POA: Diagnosis not present

## 2021-09-27 DIAGNOSIS — E1142 Type 2 diabetes mellitus with diabetic polyneuropathy: Secondary | ICD-10-CM

## 2021-09-27 DIAGNOSIS — B351 Tinea unguium: Secondary | ICD-10-CM | POA: Diagnosis not present

## 2021-09-27 DIAGNOSIS — L84 Corns and callosities: Secondary | ICD-10-CM | POA: Diagnosis not present

## 2021-09-29 DIAGNOSIS — I5032 Chronic diastolic (congestive) heart failure: Secondary | ICD-10-CM | POA: Diagnosis not present

## 2021-09-29 DIAGNOSIS — M48061 Spinal stenosis, lumbar region without neurogenic claudication: Secondary | ICD-10-CM | POA: Diagnosis not present

## 2021-09-29 DIAGNOSIS — M6281 Muscle weakness (generalized): Secondary | ICD-10-CM | POA: Diagnosis not present

## 2021-09-29 DIAGNOSIS — M21371 Foot drop, right foot: Secondary | ICD-10-CM | POA: Diagnosis not present

## 2021-09-29 DIAGNOSIS — E1122 Type 2 diabetes mellitus with diabetic chronic kidney disease: Secondary | ICD-10-CM | POA: Diagnosis not present

## 2021-09-29 DIAGNOSIS — I130 Hypertensive heart and chronic kidney disease with heart failure and stage 1 through stage 4 chronic kidney disease, or unspecified chronic kidney disease: Secondary | ICD-10-CM | POA: Diagnosis not present

## 2021-10-03 NOTE — Progress Notes (Signed)
  Subjective:  Patient ID: Erin Carr, female    DOB: [birth year],  MRN: [id number]  Erin Carr presents to clinic today for at risk foot care with history of diabetic neuropathy and corn(s) b/l feet , callus(es) left heel and painful mycotic nails.  Pain interferes with ambulation. Aggravating factors include wearing enclosed shoe gear. Painful toenails interfere with ambulation. Aggravating factors include wearing enclosed shoe gear. Pain is relieved with periodic professional debridement. Painful corns and calluses are aggravated when weightbearing with and without shoegear. Pain is relieved with periodic professional debridement.  Patient states blood glucose was 154 mg/dl today.    She is accompanied by her niece/POA, Erin Carr.   Patient states her left heel is sore. Niece states patient was in rehab and developed a DTI which has healed.  PCP is Ngetich, Dinah C, NP , and last visit was 09/12/2021.  No Known Allergies  Review of Systems: Negative except as noted in the HPI. Objective:   Constitutional Erin Carr is a pleasant 87 y.o. African American female, WD, WN in NAD. AAO x 3.   Vascular CFT <3 seconds b/l LE. Palpable DP pulse(s) b/l lower extremities Nonpalpable PT pulse(s) b/l lower extremities. Pedal hair absent. Lower extremity skin temperature gradient within normal limits. No pain with calf compression b/l. No edema noted b/l LE. No cyanosis or clubbing noted.  Neurologic Normal speech. Oriented to person, place, and time. Protective sensation diminished with 10g monofilament b/l.  Dermatologic Pedal skin is warm and supple b/l LE. No interdigital macerations noted b/l LE. Toenails 1-5 b/l elongated, discolored, dystrophic, thickened, crumbly with subungual debris and tenderness to dorsal palpation. Dime size unstageable decubitus ulcer posterior aspect left heel. No edema, no erythema, no drainage, no fluctuance.. Hyperkeratotic lesion(s) R 2nd toe and R 5th toe.   No erythema, no edema, no drainage, no fluctuance.  Orthopedic: Normal muscle strength 5/5 to all lower extremity muscle groups bilaterally. Wearing AFO on right lower extremity. Hammertoe deformity noted 2-5 b/l.   Radiographs: None Assessment:   1. Pain due to onychomycosis of toenails of both feet   2. Corns   3. Decubitus ulcer of heel, left, unstageable ([place])   4. Diabetic peripheral neuropathy associated with type 2 diabetes mellitus ([place])    Plan:  Patient was evaluated and treated and all questions answered. Consent given for treatment as described below: -Examined patient. -Patient had pressure sore on left heel. Per niece, it has healed. I recommended floating heels when in bed or recliner. Niece related understanding.. -Continue diabetic foot care principles: inspect feet daily, monitor glucose as recommended by PCP and/or Endocrinologist, and follow prescribed diet per PCP, Endocrinologist and/or dietician. -Corn(s) R 2nd toe and R 5th toe pared utilizing sterile scalpel blade without complication or incident. Total number debrided=2. -Patient/POA to call should there be question/concern in the interim.  Return in about 3 months (around 12/28/2021).  Marzetta Board, DPM

## 2021-10-04 DIAGNOSIS — I5032 Chronic diastolic (congestive) heart failure: Secondary | ICD-10-CM | POA: Diagnosis not present

## 2021-10-04 DIAGNOSIS — E1122 Type 2 diabetes mellitus with diabetic chronic kidney disease: Secondary | ICD-10-CM | POA: Diagnosis not present

## 2021-10-04 DIAGNOSIS — M6281 Muscle weakness (generalized): Secondary | ICD-10-CM | POA: Diagnosis not present

## 2021-10-04 DIAGNOSIS — I25118 Atherosclerotic heart disease of native coronary artery with other forms of angina pectoris: Secondary | ICD-10-CM | POA: Diagnosis not present

## 2021-10-04 DIAGNOSIS — I5020 Unspecified systolic (congestive) heart failure: Secondary | ICD-10-CM | POA: Diagnosis not present

## 2021-10-04 DIAGNOSIS — M21371 Foot drop, right foot: Secondary | ICD-10-CM | POA: Diagnosis not present

## 2021-10-04 DIAGNOSIS — I130 Hypertensive heart and chronic kidney disease with heart failure and stage 1 through stage 4 chronic kidney disease, or unspecified chronic kidney disease: Secondary | ICD-10-CM | POA: Diagnosis not present

## 2021-10-04 DIAGNOSIS — M48061 Spinal stenosis, lumbar region without neurogenic claudication: Secondary | ICD-10-CM | POA: Diagnosis not present

## 2021-10-05 DIAGNOSIS — I130 Hypertensive heart and chronic kidney disease with heart failure and stage 1 through stage 4 chronic kidney disease, or unspecified chronic kidney disease: Secondary | ICD-10-CM | POA: Diagnosis not present

## 2021-10-05 DIAGNOSIS — M6281 Muscle weakness (generalized): Secondary | ICD-10-CM | POA: Diagnosis not present

## 2021-10-05 DIAGNOSIS — E1122 Type 2 diabetes mellitus with diabetic chronic kidney disease: Secondary | ICD-10-CM | POA: Diagnosis not present

## 2021-10-05 DIAGNOSIS — M21371 Foot drop, right foot: Secondary | ICD-10-CM | POA: Diagnosis not present

## 2021-10-05 DIAGNOSIS — M48061 Spinal stenosis, lumbar region without neurogenic claudication: Secondary | ICD-10-CM | POA: Diagnosis not present

## 2021-10-05 DIAGNOSIS — I5032 Chronic diastolic (congestive) heart failure: Secondary | ICD-10-CM | POA: Diagnosis not present

## 2021-10-06 ENCOUNTER — Telehealth: Payer: Self-pay | Admitting: *Deleted

## 2021-10-06 MED ORDER — METOPROLOL TARTRATE 25 MG PO TABS: 25.0000 mg | ORAL_TABLET | Freq: Two times a day (BID) | ORAL | 1 refills | Status: DC

## 2021-10-06 NOTE — Telephone Encounter (Signed)
Metoprolol 25 mg tablet take 12.5 mg ( 1/2 tablet ) was changed during hospital admission.If blood pressure still in the 130-140's and Heart rate is > 60 b/min.May continue on Metoprolol 25 mg tablet one by mouth twice daily.

## 2021-10-06 NOTE — Telephone Encounter (Signed)
Patient niece, Judeen Hammans called and stated that she went to get a refill on Patient's Metoprolol and the pharmacy will not refill it until 11/7.   Niece stated that she has been giving the patient One tablet twice daily. Stated that no one told her that it was decreased to 1/2 twice daily.   Patient's blood pressures have been running good with the top number being between 130-140.  Can medication keep being given at ONE tablet twice daily and a Rx be sent in for this.   Please Advise.

## 2021-10-06 NOTE — Telephone Encounter (Signed)
Patient caregiver notified and Rx sent to pharmacy.

## 2021-10-12 DIAGNOSIS — I5032 Chronic diastolic (congestive) heart failure: Secondary | ICD-10-CM | POA: Diagnosis not present

## 2021-10-12 DIAGNOSIS — M21371 Foot drop, right foot: Secondary | ICD-10-CM | POA: Diagnosis not present

## 2021-10-12 DIAGNOSIS — I130 Hypertensive heart and chronic kidney disease with heart failure and stage 1 through stage 4 chronic kidney disease, or unspecified chronic kidney disease: Secondary | ICD-10-CM | POA: Diagnosis not present

## 2021-10-12 DIAGNOSIS — M48061 Spinal stenosis, lumbar region without neurogenic claudication: Secondary | ICD-10-CM | POA: Diagnosis not present

## 2021-10-12 DIAGNOSIS — E1122 Type 2 diabetes mellitus with diabetic chronic kidney disease: Secondary | ICD-10-CM | POA: Diagnosis not present

## 2021-10-12 DIAGNOSIS — M6281 Muscle weakness (generalized): Secondary | ICD-10-CM | POA: Diagnosis not present

## 2021-10-13 DIAGNOSIS — M21371 Foot drop, right foot: Secondary | ICD-10-CM | POA: Diagnosis not present

## 2021-10-13 DIAGNOSIS — E1122 Type 2 diabetes mellitus with diabetic chronic kidney disease: Secondary | ICD-10-CM | POA: Diagnosis not present

## 2021-10-13 DIAGNOSIS — M48061 Spinal stenosis, lumbar region without neurogenic claudication: Secondary | ICD-10-CM | POA: Diagnosis not present

## 2021-10-13 DIAGNOSIS — I5032 Chronic diastolic (congestive) heart failure: Secondary | ICD-10-CM | POA: Diagnosis not present

## 2021-10-13 DIAGNOSIS — M6281 Muscle weakness (generalized): Secondary | ICD-10-CM | POA: Diagnosis not present

## 2021-10-13 DIAGNOSIS — I130 Hypertensive heart and chronic kidney disease with heart failure and stage 1 through stage 4 chronic kidney disease, or unspecified chronic kidney disease: Secondary | ICD-10-CM | POA: Diagnosis not present

## 2021-10-21 ENCOUNTER — Other Ambulatory Visit: Payer: Self-pay | Admitting: *Deleted

## 2021-10-21 NOTE — Patient Outreach (Signed)
[place] [facility]) Care Management  10/21/2021  Erin Carr 1930/06/18 [id number]   Outgoing call placed to member's niece Judeen Hammans, successful.  Denies any urgent concerns, encouraged to contact this care manager with questions.  Agrees to follow up within the next month.  Care Plan : General Plan of Care (Adult)  Updates made by Valente David, RN since 10/21/2021 12:00 AM     Problem: Knowledge deficit of management of acute/chronic conditions evidenced by recent admission requiring stay at [facility]      Long-Range Goal: Patient will show ability to manage chronic conditions and recover from acute conditions as evidenced by no hospital readmissions in the next 6 months   Start Date: 08/24/2021  Expected End Date: 02/20/2022  This Visit's Progress: On track  Recent Progress: On track  Priority: High  Note:   Current Barriers:  Knowledge Deficits related to plan of care for management of Covid and UTI  Chronic Disease Management support and education needs related to Covid and UTI  RNCM Clinical Goal(s):  Patient will verbalize understanding of plan for management of Covid and UTI attend all scheduled medical appointments: PCP not experience hospital admission. Hospital Admissions in last 6 months = 2  through collaboration with RN Care manager, provider, and care team.   Interventions: 1:1 collaboration with primary care provider regarding development and update of comprehensive plan of care as evidenced by provider attestation and co-signature Inter-disciplinary care team collaboration (see longitudinal plan of care) Evaluation of current treatment plan related to  self management and patient's adherence to plan as established by provider   COVID:  (Status: Goal on track: YES.) Screening for signs and symptoms of depression; , Discussed effects, symptoms, and management of "long COVID"';, and Assessed social determinant of health barriers;   UTI  (Status: Goal on  track: YES.) Evaluation of current treatment plan related to  Covid and UTI ,  self-management and patient's adherence to plan as established by provider. Discussed plans with patient for ongoing care management follow up and provided patient with direct contact information for care management team Advised patient to contact this RNCM with questions; Reviewed medications with patient and discussed importance of medication adherence; Reviewed scheduled/upcoming provider appointments including PCP;   Diabetes:  (Status: Goal on Track (progressing): YES.) Lab Results  Component Value Date   HGBA1C 10.2 (H) 09/16/2021  Assessed patient's understanding of A1c goal: <7% Provided education to patient about basic DM disease process; Reviewed medications with patient and discussed importance of medication adherence;        Reviewed prescribed diet with patient low carb/low sugar; Reviewed scheduled/upcoming provider appointments including: PCP;         Advised patient, providing education and rationale, to check cbg daily and record         Patient Goals/Self-Care Activities: Patient will attend all scheduled provider appointments Patient will continue to perform ADL's independently Patient will call provider office for new concerns or questions   Update 11/18 - Per niece, member has completed PT and OT sessions with home health, continues to regain strength.  Since restarting Metformin, state blood sugars have been much better as well as blood pressure being "stable."  Denies any hypo/hyperglycemic episodes, following diabetic plan of care.     Valente David, [place], MSN [place] [phone]

## 2021-11-17 ENCOUNTER — Other Ambulatory Visit: Payer: Self-pay | Admitting: *Deleted

## 2021-11-17 NOTE — Patient Outreach (Signed)
[facility] [facility]) Care Management  11/17/2021  Erin Carr Sep 28, 1930 [id number]   Outgoing call placed to member's niece, no answer, HIPAA compliant voice message left. Will follow up within the next 3-4 business days.  Valente David, RN, MSN, [place] Manager [phone]

## 2021-11-18 ENCOUNTER — Ambulatory Visit: Payer: Self-pay | Admitting: *Deleted

## 2021-11-22 ENCOUNTER — Other Ambulatory Visit: Payer: Self-pay | Admitting: *Deleted

## 2021-11-22 NOTE — Patient Outreach (Signed)
[place] [facility]) Care Management  11/22/2021  Erin Carr 30-Oct-1930 [id number]   Outgoing call placed to member's niece/POA, no answer, HIPAA compliant voice message left.  Will send outreach letter and follow up within the next 3-4 business days.  Valente David, RN, MSN, [place] Manager [phone]

## 2021-11-23 ENCOUNTER — Ambulatory Visit (HOSPITAL_COMMUNITY)
Admission: RE | Admit: 2021-11-23 | Discharge: 2021-11-23 | Disposition: A | Discharge status: Home / Self Care | Payer: Medicare Other | Source: Ambulatory Visit | Attending: Internal Medicine | Admitting: Internal Medicine

## 2021-11-23 ENCOUNTER — Other Ambulatory Visit: Payer: Self-pay

## 2021-11-23 ENCOUNTER — Inpatient Hospital Stay: Payer: Medicare Other | Attending: Internal Medicine

## 2021-11-23 DIAGNOSIS — C3490 Malignant neoplasm of unspecified part of unspecified bronchus or lung: Secondary | ICD-10-CM | POA: Insufficient documentation

## 2021-11-23 DIAGNOSIS — I700 Atherosclerosis of aorta: Secondary | ICD-10-CM | POA: Diagnosis not present

## 2021-11-23 DIAGNOSIS — J439 Emphysema, unspecified: Secondary | ICD-10-CM | POA: Diagnosis not present

## 2021-11-23 DIAGNOSIS — R911 Solitary pulmonary nodule: Secondary | ICD-10-CM | POA: Diagnosis not present

## 2021-11-23 LAB — CMP (CANCER CENTER ONLY)
ALT: 8 U/L (ref 0–44)
AST: 11 U/L — ABNORMAL LOW (ref 15–41)
Albumin: 3.9 g/dL (ref 3.5–5.0)
Alkaline Phosphatase: 106 U/L (ref 38–126)
Anion gap: 7 (ref 5–15)
BUN: 19 mg/dL (ref 8–23)
CO2: 28 mmol/L (ref 22–32)
Calcium: 9.7 mg/dL (ref 8.9–10.3)
Chloride: 101 mmol/L (ref 98–111)
Creatinine: 1.4 mg/dL — ABNORMAL HIGH (ref 0.44–1.00)
GFR, Estimated: 36 mL/min — ABNORMAL LOW (ref >60)
Glucose, Bld: 348 mg/dL — ABNORMAL HIGH (ref 70–99)
Potassium: 4.3 mmol/L (ref 3.5–5.1)
Sodium: 136 mmol/L (ref 135–145)
Total Bilirubin: 0.4 mg/dL (ref 0.3–1.2)
Total Protein: 7 g/dL (ref 6.5–8.1)

## 2021-11-23 LAB — CBC WITH DIFFERENTIAL (CANCER CENTER ONLY)
Abs Immature Granulocytes: 0.02 10*3/uL (ref 0.00–0.07)
Basophils Absolute: 0 10*3/uL (ref 0.0–0.1)
Basophils Relative: 1 %
Eosinophils Absolute: 0.5 10*3/uL (ref 0.0–0.5)
Eosinophils Relative: 8 %
HCT: 36 % (ref 36.0–46.0)
Hemoglobin: 10.9 g/dL — ABNORMAL LOW (ref 12.0–15.0)
Immature Granulocytes: 0 %
Lymphocytes Relative: 19 %
Lymphs Abs: 1.2 10*3/uL (ref 0.7–4.0)
MCH: 24.1 pg — ABNORMAL LOW (ref 26.0–34.0)
MCHC: 30.3 g/dL (ref 30.0–36.0)
MCV: 79.5 fL — ABNORMAL LOW (ref 80.0–100.0)
Monocytes Absolute: 0.7 10*3/uL (ref 0.1–1.0)
Monocytes Relative: 11 %
Neutro Abs: 3.7 10*3/uL (ref 1.7–7.7)
Neutrophils Relative %: 61 %
Platelet Count: 197 10*3/uL (ref 150–400)
RBC: 4.53 MIL/uL (ref 3.87–5.11)
RDW: 17.8 % — ABNORMAL HIGH (ref 11.5–15.5)
WBC Count: 6 10*3/uL (ref 4.0–10.5)
nRBC: 0 % (ref 0.0–0.2)

## 2021-11-24 ENCOUNTER — Inpatient Hospital Stay (HOSPITAL_BASED_OUTPATIENT_CLINIC_OR_DEPARTMENT_OTHER): Payer: Medicare Other | Admitting: Internal Medicine

## 2021-11-24 VITALS — BP 191/81 | HR 72 | Temp 98.1°F | Resp 16 | Ht 62.0 in | Wt 120.6 lb

## 2021-11-24 DIAGNOSIS — C3491 Malignant neoplasm of unspecified part of right bronchus or lung: Secondary | ICD-10-CM

## 2021-11-24 DIAGNOSIS — C3490 Malignant neoplasm of unspecified part of unspecified bronchus or lung: Secondary | ICD-10-CM | POA: Diagnosis not present

## 2021-11-24 NOTE — Progress Notes (Signed)
[place] [phone]   [phone]  OFFICE PROGRESS NOTE  Ngetich, Nelda Bucks, NP [place] [address]  DIAGNOSIS: Stage IA (T1b, N0, M0) non-small cell lung cancer, adenocarcinoma presented with right upper lobe lung nodule diagnosed in November 2017.  PRIOR THERAPY: Curative stereotactic body radiotherapy to the right upper lobe lung nodule under the care of Dr. Sondra Come completed on 12/27/2016.  CURRENT THERAPY: Observation.  INTERVAL HISTORY: Erin Carr 87 y.o. female returns to the clinic today for follow-up visit accompanied by her daughter.  The patient is feeling fine today with no concerning complaints.  She denied having any current chest pain, shortness of breath, cough or hemoptysis.  She denied having any fever or chills.  She has no nausea, vomiting, diarrhea or constipation.  She has no headache or visual changes.  She is here today for evaluation with repeat CT scan of the chest for restaging of her disease.  The patient mentions that her blood pressure is usually normal at home.  She just took her treatment with Norvasc an hour ago.  MEDICAL HISTORY: Past Medical History:  Diagnosis Date   Adenocarcinoma of right lung, stage 1 ([place]) 11/16/2016   Arthritis    Asthma    COPD (chronic obstructive pulmonary disease) (HCC)    Diabetes mellitus without complication ([place])    Type II   Family history of adverse reaction to anesthesia    "aunt had PONV"   High cholesterol    History of radiation therapy 12/19/16-12/27/16   SBRT to right upper lobe 54 Gy in 3 fractions   Hypercholesteremia    Hypertension    Osteoporosis    Positive PPD 11/2015   PER PCP   Shortness of breath dyspnea    Tremors of nervous system    Left hand   Urinary frequency    Vitamin D deficiency    Wears glasses     ALLERGIES:  has No Known Allergies.  MEDICATIONS:  Current Outpatient Medications  Medication Sig Dispense Refill    amLODipine (NORVASC) 10 MG tablet Take 1 tablet (10 mg total) by mouth daily. 90 tablet 1   aspirin EC 81 MG tablet Take 81 mg by mouth daily.     atorvastatin (LIPITOR) 40 MG tablet Take 1 tablet (40 mg total) by mouth daily at 6 PM. 90 tablet 1   brimonidine (ALPHAGAN) 0.2 % ophthalmic solution Place 1 drop into the left eye 2 (two) times daily.     docusate sodium (COLACE) 100 MG capsule Take 100 mg by mouth at bedtime.     ergocalciferol (VITAMIN D2) 1.25 MG (50000 UT) capsule Take 1 capsule (50,000 Units total) by mouth once a week. On Monday 4 capsule 2   furosemide (LASIX) 20 MG tablet Take 2 tablets (40 mg total) by mouth daily.     gabapentin (NEURONTIN) 100 MG capsule Take 2 capsules (200 mg total) by mouth 2 (two) times daily.     latanoprost (XALATAN) 0.005 % ophthalmic solution Place 1 drop into the left eye at bedtime.     lisinopril (PRINIVIL,ZESTRIL) 20 MG tablet Take 20 mg by mouth 2 (two) times daily.     metFORMIN (GLUCOPHAGE) 500 MG tablet Take 1 tablet (500 mg total) by mouth daily.     metoprolol tartrate (LOPRESSOR) 25 MG tablet Take 1 tablet (25 mg total) by mouth 2 (two) times daily. 90 tablet 1   polyethylene glycol powder (GLYCOLAX/MIRALAX)  17 GM/SCOOP powder Take 17 g by mouth daily. Hold for loose stool 3350 g 1   potassium chloride (KLOR-CON) 10 MEQ tablet Take 1 tablet (10 mEq total) by mouth daily. 90 tablet 1   No current facility-administered medications for this visit.    SURGICAL HISTORY:  Past Surgical History:  Procedure Laterality Date   ABDOMINAL HYSTERECTOMY     ANTERIOR AND POSTERIOR REPAIR N/A 12/16/2014   Procedure: ANTERIOR (CYSTOCELE) AND POSTERIOR REPAIR (RECTOCELE);  Surgeon: Crawford Givens, MD;  Location: [facility];  Service: Gynecology;  Laterality: N/A;   APPENDECTOMY     BOWEL RESECTION     BREAST BIOPSY Left    COLONOSCOPY     VIDEO BRONCHOSCOPY WITH ENDOBRONCHIAL ULTRASOUND N/A 12/17/2015   Procedure: VIDEO BRONCHOSCOPY WITH ENDOBRONCHIAL  ULTRASOUND;  Surgeon: Ivin Poot, MD;  Location: [facility];  Service: Thoracic;  Laterality: N/A;    REVIEW OF SYSTEMS:  A comprehensive review of systems was negative.   PHYSICAL EXAMINATION: General appearance: alert, cooperative, and no distress Head: Normocephalic, without obvious abnormality, atraumatic Neck: no adenopathy, no JVD, supple, symmetrical, trachea midline, and thyroid not enlarged, symmetric, no tenderness/mass/nodules Lymph nodes: Cervical, supraclavicular, and axillary nodes normal. Resp: clear to auscultation bilaterally Back: symmetric, no curvature. ROM normal. No CVA tenderness. Cardio: regular rate and rhythm, S1, S2 normal, no murmur, click, rub or gallop GI: soft, non-tender; bowel sounds normal; no masses,  no organomegaly Extremities: extremities normal, atraumatic, no cyanosis or edema  ECOG PERFORMANCE STATUS: 1 - Symptomatic but completely ambulatory  Blood pressure (!) 191/81, pulse 72, temperature 98.1 F (36.7 C), temperature source Tympanic, resp. rate 16, height 5\' 2"  (1.575 m), weight 120 lb 9 oz (54.7 kg), SpO2 100 %.  LABORATORY DATA: Lab Results  Component Value Date   WBC 6.0 11/23/2021   HGB 10.9 (L) 11/23/2021   HCT 36.0 11/23/2021   MCV 79.5 (L) 11/23/2021   PLT 197 11/23/2021      Chemistry      Component Value Date/Time   NA 136 11/23/2021 1013   NA 133 (L) 08/01/2017 1005   K 4.3 11/23/2021 1013   K 4.1 08/01/2017 1005   CL 101 11/23/2021 1013   CO2 28 11/23/2021 1013   CO2 27 08/01/2017 1005   BUN 19 11/23/2021 1013   BUN 10.4 08/01/2017 1005   CREATININE 1.40 (H) 11/23/2021 1013   CREATININE 1.14 (H) 09/16/2021 0932   CREATININE 0.7 08/01/2017 1005      Component Value Date/Time   CALCIUM 9.7 11/23/2021 1013   CALCIUM 9.1 08/01/2017 1005   ALKPHOS 106 11/23/2021 1013   ALKPHOS 77 08/01/2017 1005   AST 11 (L) 11/23/2021 1013   AST 51 (H) 08/01/2017 1005   ALT 8 11/23/2021 1013   ALT 63 (H) 08/01/2017 1005    BILITOT 0.4 11/23/2021 1013   BILITOT 0.40 08/01/2017 1005       RADIOGRAPHIC STUDIES: CT Chest Wo Contrast  Result Date: 11/24/2021 CLINICAL DATA:  Non-small-cell lung cancer.  Restaging. EXAM: CT CHEST WITHOUT CONTRAST TECHNIQUE: Multidetector CT imaging of the chest was performed following the standard protocol without IV contrast. COMPARISON:  11/24/2020 FINDINGS: Cardiovascular: The heart size is normal. No substantial pericardial effusion. Coronary artery calcification is evident. Moderate atherosclerotic calcification is noted in the wall of the thoracic aorta. Mediastinum/Nodes: No mediastinal lymphadenopathy. No evidence for gross hilar lymphadenopathy although assessment is limited by the lack of intravenous contrast on the current study. The esophagus has normal imaging features.  There is no axillary lymphadenopathy. Lungs/Pleura: Centrilobular emphsyema noted. Stable parahilar post treatment scarring in the right lung. Stable 4 mm paraspinal left lower lobe nodule on 101/5. No new suspicious pulmonary nodule or mass. No focal airspace consolidation. No pleural effusion. Upper Abdomen: Stable cyst upper pole right kidney. Musculoskeletal: No worrisome lytic or sclerotic osseous abnormality. IMPRESSION: 1. Stable exam. No new or progressive findings to suggest recurrent or metastatic disease. 2. Stable post treatment scarring in the right parahilar lung. 3. Aortic Atherosclerosis (ICD10-I70.0) and Emphysema (ICD10-J43.9). Electronically Signed   By: Misty Stanley M.D.   On: 11/24/2021 07:37      ASSESSMENT AND PLAN: This is a very pleasant 87 years old stage IA non-small cell lung cancer presented with right upper lobe pulmonary nodule status post curative stereotactic body radiotherapy. The patient has been in observation for the last 5 years and she is doing fine. Repeat CT scan of the chest showed no concerning findings for disease recurrence or metastasis. I recommended for the patient  to start seeing her primary care physician from now on for close monitoring of her disease as well as her other medical issues.  I do not need to see her at regular basis at this point but I will be happy to see her in the future if needed. For the hypertension she mentions that her blood pressure is usually normal at home.  She just took her treatment with Norvasc an hour ago.  Her daughter will recheck her blood pressure after going home and if it still elevated she will reach out to her primary care physician for recommendation. The patient was advised to call if she has any concerning issues. The patient voices understanding of current disease status and treatment options and is in agreement with the current care plan. All questions were answered. The patient knows to call the clinic with any problems, questions or concerns. We can certainly see the patient much sooner if necessary.  Disclaimer: This note was dictated with voice recognition software. Similar sounding words can inadvertently be transcribed and may not be corrected upon review.

## 2021-11-29 ENCOUNTER — Other Ambulatory Visit: Payer: Self-pay | Admitting: *Deleted

## 2021-11-29 NOTE — Patient Outreach (Signed)
[place] [facility]) Care Management  [place]  11/29/2021   MANPREET KEMMER 29-Mar-1930 [id number]   Outreach attempt #3, successful to niece.  Denies any urgent concerns, encouraged to contact this care manager with questions.    Encounter Medications:  Outpatient Encounter Medications as of 11/29/2021  Medication Sig   amLODipine (NORVASC) 10 MG tablet Take 1 tablet (10 mg total) by mouth daily.   aspirin EC 81 MG tablet Take 81 mg by mouth daily.   atorvastatin (LIPITOR) 40 MG tablet Take 1 tablet (40 mg total) by mouth daily at 6 PM.   brimonidine (ALPHAGAN) 0.2 % ophthalmic solution Place 1 drop into the left eye 2 (two) times daily.   docusate sodium (COLACE) 100 MG capsule Take 100 mg by mouth at bedtime.   ergocalciferol (VITAMIN D2) 1.25 MG (50000 UT) capsule Take 1 capsule (50,000 Units total) by mouth once a week. On Monday   furosemide (LASIX) 20 MG tablet Take 2 tablets (40 mg total) by mouth daily.   gabapentin (NEURONTIN) 100 MG capsule Take 2 capsules (200 mg total) by mouth 2 (two) times daily.   latanoprost (XALATAN) 0.005 % ophthalmic solution Place 1 drop into the left eye at bedtime.   lisinopril (PRINIVIL,ZESTRIL) 20 MG tablet Take 20 mg by mouth 2 (two) times daily.   metFORMIN (GLUCOPHAGE) 500 MG tablet Take 1 tablet (500 mg total) by mouth daily.   metoprolol tartrate (LOPRESSOR) 25 MG tablet Take 1 tablet (25 mg total) by mouth 2 (two) times daily.   Netarsudil Dimesylate (RHOPRESSA) 0.02 % SOLN Place 1 drop into the left eye daily.   polyethylene glycol powder (GLYCOLAX/MIRALAX) 17 GM/SCOOP powder Take 17 g by mouth daily. Hold for loose stool   potassium chloride (KLOR-CON) 10 MEQ tablet Take 1 tablet (10 mEq total) by mouth daily.   No facility-administered encounter medications on file as of 11/29/2021.    Functional Status:  In your present state of health, do you have any difficulty performing the following activities: 06/08/2021   Hearing? N  Vision? Y  Difficulty concentrating or making decisions? Y  Walking or climbing stairs? Y  Comment secondary to weakness  Dressing or bathing? Y  Doing errands, shopping? Y  Some recent data might be hidden    Fall/Depression Screening: Fall Risk  09/12/2021 08/24/2021 06/28/2017  Falls in the past year? 0 0 No  Number falls in past yr: 0 0 -  Injury with Fall? 0 0 -  Risk for fall due to : No Fall Risks No Fall Risks -  Follow up Falls evaluation completed Falls evaluation completed -   PHQ 2/9 Scores 08/24/2021 06/28/2017 06/07/2017 02/08/2017 12/06/2016  PHQ - 2 Score 0 0 0 0 0    Assessment:   Care Plan Care Plan : General Plan of Care (Adult)  Updates made by Valente David, RN since 11/29/2021 12:00 AM     Problem: Knowledge deficit of management of acute/chronic conditions evidenced by recent admission requiring stay at [facility]      Long-Range Goal: Patient will show ability to manage chronic conditions and recover from acute conditions as evidenced by no hospital readmissions in the next 6 months   Start Date: 08/24/2021  Expected End Date: 02/20/2022  This Visit's Progress: On track  Recent Progress: On track  Priority: High  Note:   Current Barriers:  Knowledge Deficits related to plan of care for management of Covid and UTI  Chronic Disease Management support and education  needs related to Covid and UTI  RNCM Clinical Goal(s):  Patient will verbalize understanding of plan for management of Covid and UTI attend all scheduled medical appointments: PCP not experience hospital admission. Hospital Admissions in last 6 months = 2  through collaboration with RN Care manager, provider, and care team.   Interventions: 1:1 collaboration with primary care provider regarding development and update of comprehensive plan of care as evidenced by provider attestation and co-signature Inter-disciplinary care team collaboration (see longitudinal plan of care) Evaluation of  current treatment plan related to  self management and patient's adherence to plan as established by provider   COVID:  (Status: Goal on track: YES.) Screening for signs and symptoms of depression; , Discussed effects, symptoms, and management of "long COVID"';, and Assessed social determinant of health barriers;   UTI  (Status: Goal on track: YES.) Evaluation of current treatment plan related to  Covid and UTI ,  self-management and patient's adherence to plan as established by provider. Discussed plans with patient for ongoing care management follow up and provided patient with direct contact information for care management team Advised patient to contact this RNCM with questions; Reviewed medications with patient and discussed importance of medication adherence; Reviewed scheduled/upcoming provider appointments including PCP;   Diabetes:  (Status: Goal on Track (progressing): YES.) Lab Results  Component Value Date   HGBA1C 10.2 (H) 09/16/2021  Assessed patient's understanding of A1c goal: <7% Provided education to patient about basic DM disease process; Reviewed medications with patient and discussed importance of medication adherence;        Reviewed prescribed diet with patient low carb/low sugar; Reviewed scheduled/upcoming provider appointments including: PCP;         Advised patient, providing education and rationale, to check cbg daily and record         Patient Goals/Self-Care Activities: Patient will attend all scheduled provider appointments Patient will continue to perform ADL's independently Patient will call provider office for new concerns or questions   Update 11/18 - Per niece, member has completed PT and OT sessions with home health, continues to regain strength.  Since restarting Metformin, state blood sugars have been much better as well as blood pressure being "stable."  Denies any hypo/hyperglycemic episodes, following diabetic plan of care.    Update 12/27 -  Niece report member is doing well.  Recently had cough and congestion, was taking diabetic robitussin for cough.  It incidentally increased member's blood sugars greater then 200, normal range 110-130's.  Niece state readings have decreased since she stopped taking the medication, today was 187.  Encouraged to continue to monitor and notify PCP if numbers done continue to decline over the next couple days.  Next PCP appointment scheduled for February.            Plan:  Follow-up: Patient agrees to Care Plan and Follow-up. Follow-up in 1 month(s).  Valente David, RN, MSN, [place] Manager [phone]

## 2021-12-01 ENCOUNTER — Telehealth: Payer: Medicare Other | Admitting: Family

## 2021-12-01 ENCOUNTER — Encounter: Payer: Self-pay | Admitting: Family

## 2021-12-01 ENCOUNTER — Other Ambulatory Visit: Payer: Self-pay

## 2021-12-01 ENCOUNTER — Telehealth (INDEPENDENT_AMBULATORY_CARE_PROVIDER_SITE_OTHER): Payer: Medicare Other | Admitting: Family

## 2021-12-01 DIAGNOSIS — E1142 Type 2 diabetes mellitus with diabetic polyneuropathy: Secondary | ICD-10-CM

## 2021-12-01 MED ORDER — METFORMIN HCL 500 MG PO TABS
500.0000 mg | ORAL_TABLET | Freq: Two times a day (BID) | ORAL | 0 refills | Status: AC
Start: 2021-12-01 — End: 2022-03-01

## 2021-12-01 NOTE — Progress Notes (Signed)
This service is provided via telemedicine  No vital signs collected/recorded due to the encounter was a telemedicine visit.   Location of patient (ex: home, work):  Home.  Patient consents to a telephone visit:  Yes  Location of the provider (ex: office, home):  [employer].  Name of any referring provider:  Zebedee Segundo, Nelda Bucks, NP   Names of all persons participating in the telemedicine service and their role in the encounter:  Patient, Christeen Douglas Carlena Sax, RMA, Marlowe Sax, NP.    Time spent on call:  8 minutes spent on the phone with Medical Assistant.       Provider: Marlowe Sax FNP-C  Heavenly Christine, Nelda Bucks, NP  Patient Care Team: Chosen Garron, Nelda Bucks, NP as PCP - General (Family Medicine) Valente David, RN as [place] Management  Extended Emergency Contact Information Primary Emergency Contact: [place] Phone: [phone] Relation: Niece Secondary Emergency Contact: Parker,Tonecka Address: [address]          [place], [place] [address] [place] Phone: [phone] Work Phone: [phone] Mobile Phone: [phone] Relation: Niece  Code Status:  DNR Goals of care: Advanced Directive information Advanced Directives 12/01/2021  Does Patient Have a Medical Advance Directive? Yes  Type of Paramedic of [place];Living will  Does patient want to make changes to medical advance directive? No - Patient declined  Copy of [place] in Chart? Yes - validated most recent copy scanned in chart (See row information)  Would patient like information on creating a medical advance directive? -     Chief Complaint  Patient presents with   Acute Visit    Patient complains of blood sugar running high. This started over 1 week ago.    HPI:  Pt is a 87 y.o. female seen today for an acute visit for evaluation of high blood sugar x 1  week. States 200's she had cough and daughter gave her cough syrup.denies any fever or chills.CBG was 189 today.Daughter states Prior to one week ago CBG have been in the 130's -180's.latest Hgb A 1 C 10.2 Currently on metformin 500 mg tablet daily  She denies any symptoms of hyperglycemia.    Past Medical History:  Diagnosis Date   Adenocarcinoma of right lung, stage 1 ([place]) 11/16/2016   Arthritis    Asthma    COPD (chronic obstructive pulmonary disease) (HCC)    Diabetes mellitus without complication ([place])    Type II   Family history of adverse reaction to anesthesia    "aunt had PONV"   High cholesterol    History of radiation therapy 12/19/16-12/27/16   SBRT to right upper lobe 54 Gy in 3 fractions   Hypercholesteremia    Hypertension    Osteoporosis    Positive PPD 11/2015   PER PCP   Shortness of breath dyspnea    Tremors of nervous system    Left hand   Urinary frequency    Vitamin D deficiency    Wears glasses    Past Surgical History:  Procedure Laterality Date   ABDOMINAL HYSTERECTOMY     ANTERIOR AND POSTERIOR REPAIR N/A 12/16/2014   Procedure: ANTERIOR (CYSTOCELE) AND POSTERIOR REPAIR (RECTOCELE);  Surgeon: Crawford Givens, MD;  Location: [facility];  Service: Gynecology;  Laterality: N/A;   APPENDECTOMY     BOWEL RESECTION     BREAST BIOPSY Left    COLONOSCOPY     VIDEO  BRONCHOSCOPY WITH ENDOBRONCHIAL ULTRASOUND N/A 12/17/2015   Procedure: VIDEO BRONCHOSCOPY WITH ENDOBRONCHIAL ULTRASOUND;  Surgeon: Ivin Poot, MD;  Location[facility];  Service: Thoracic;  Laterality: N/A;    No Known Allergies  Outpatient Encounter Medications as of 12/01/2021  Medication Sig   amLODipine (NORVASC) 10 MG tablet Take 1 tablet (10 mg total) by mouth daily.   aspirin EC 81 MG tablet Take 81 mg by mouth daily.   atorvastatin (LIPITOR) 40 MG tablet Take 1 tablet (40 mg total) by mouth daily at 6 PM.   brimonidine (ALPHAGAN) 0.2 % ophthalmic solution Place 1 drop into the left eye 2  (two) times daily.   docusate sodium (COLACE) 100 MG capsule Take 100 mg by mouth at bedtime.   ergocalciferol (VITAMIN D2) 1.25 MG (50000 UT) capsule Take 1 capsule (50,000 Units total) by mouth once a week. On Monday   furosemide (LASIX) 20 MG tablet Take 2 tablets (40 mg total) by mouth daily.   gabapentin (NEURONTIN) 100 MG capsule Take 2 capsules (200 mg total) by mouth 2 (two) times daily.   latanoprost (XALATAN) 0.005 % ophthalmic solution Place 1 drop into the left eye at bedtime.   lisinopril (PRINIVIL,ZESTRIL) 20 MG tablet Take 20 mg by mouth 2 (two) times daily.   metFORMIN (GLUCOPHAGE) 500 MG tablet Take 1 tablet (500 mg total) by mouth daily.   metoprolol tartrate (LOPRESSOR) 25 MG tablet Take 1 tablet (25 mg total) by mouth 2 (two) times daily.   Netarsudil Dimesylate (RHOPRESSA) 0.02 % SOLN Place 1 drop into the left eye daily.   polyethylene glycol powder (GLYCOLAX/MIRALAX) 17 GM/SCOOP powder Take 17 g by mouth daily. Hold for loose stool   potassium chloride (KLOR-CON) 10 MEQ tablet Take 1 tablet (10 mEq total) by mouth daily.   No facility-administered encounter medications on file as of 12/01/2021.    Review of Systems  Constitutional:  Negative for appetite change, chills, fatigue, fever and unexpected weight change.  HENT:  Negative for congestion, dental problem, ear discharge, ear pain, facial swelling, hearing loss, nosebleeds, postnasal drip, rhinorrhea, sinus pressure, sinus pain, sneezing, sore throat, tinnitus and trouble swallowing.   Eyes:  Negative for pain, discharge, redness, itching and visual disturbance.  Respiratory:  Negative for cough, chest tightness, shortness of breath and wheezing.   Cardiovascular:  Negative for chest pain, palpitations and leg swelling.  Endocrine: Negative for cold intolerance, heat intolerance, polydipsia, polyphagia and polyuria.  Genitourinary:  Negative for difficulty urinating, dysuria, flank pain, frequency and urgency.   Musculoskeletal:  Negative for arthralgias, back pain, gait problem, joint swelling, myalgias, neck pain and neck stiffness.  Skin:  Negative for color change, pallor, rash and wound.  Neurological:  Negative for dizziness, syncope, speech difficulty, weakness, light-headedness, numbness and headaches.  Hematological:  Does not bruise/bleed easily.  Psychiatric/Behavioral:  Negative for agitation, behavioral problems, confusion, hallucinations, self-injury, sleep disturbance and suicidal ideas. The patient is not nervous/anxious.    Immunization History  Administered Date(s) Administered   Fluad Quad(high Dose 65+) 09/12/2021   Influenza, High Dose Seasonal PF 11/06/2018   Influenza-Unspecified 10/14/2015   PFIZER(Purple Top)SARS-COV-2 Vaccination 02/03/2020, 03/02/2020   Pertinent  Health Maintenance Due  Topic Date Due   OPHTHALMOLOGY EXAM  Never done   DEXA SCAN  Never done   FOOT EXAM  10/20/2021   HEMOGLOBIN A1C  03/17/2022   INFLUENZA VACCINE  Completed   Fall Risk 06/20/2021 06/20/2021 08/24/2021 09/12/2021 12/01/2021  Falls in the past year? - -  0 0 0  Was there an injury with Fall? - - 0 0 0  Fall Risk Category Calculator - - 0 0 0  Fall Risk Category - - Low Low Low  Patient Fall Risk Level High fall risk High fall risk Low fall risk Low fall risk Low fall risk  Patient at Risk for Falls Due to - - No Fall Risks No Fall Risks No Fall Risks  Fall risk Follow up - - Falls evaluation completed Falls evaluation completed Falls evaluation completed   Functional Status Survey:    There were no vitals filed for this visit. There is no height or weight on file to calculate BMI. Physical Exam Constitutional:      General: She is not in acute distress.    Appearance: She is not ill-appearing.  Pulmonary:     Effort: Pulmonary effort is normal. No respiratory distress.  Neurological:     Mental Status: She is alert. Mental status is at baseline.  Psychiatric:        Mood and  Affect: Mood normal.        Behavior: Behavior normal.    Labs reviewed: Recent Labs    06/16/21 0344 06/17/21 0353 06/20/21 0336 09/16/21 0932 11/23/21 1013  NA 139   < > 136 139 136  K 4.5   < > 4.7 3.9 4.3  CL 104   < > 100 105 101  CO2 30   < > 30 28 28   GLUCOSE 308*   < > 160* 194* 348*  BUN 25*   < > 26* 16 19  CREATININE 1.14*   < > 1.29* 1.14* 1.40*  CALCIUM 9.2   < > 9.2 9.3 9.7  MG 2.3  --   --   --   --    < > = values in this interval not displayed.   Recent Labs    06/19/21 0647 06/20/21 0336 09/16/21 0932 11/23/21 1013  AST 20 18 11  11*  ALT 19 18 6 8   ALKPHOS 75 78  --  106  BILITOT 0.7 0.5 0.4 0.4  PROT 5.5* 5.5* 7.0 7.0  ALBUMIN 2.7* 2.9*  --  3.9   Recent Labs    06/16/21 0344 09/16/21 0932 11/23/21 1013  WBC 5.5 6.7 6.0  NEUTROABS 3.3 4,154 3.7  HGB 10.8* 10.7* 10.9*  HCT 34.4* 35.3 36.0  MCV 85.1 81.1 79.5*  PLT 263 278 197   Lab Results  Component Value Date   TSH 2.48 09/16/2021   Lab Results  Component Value Date   HGBA1C 10.2 (H) 09/16/2021   Lab Results  Component Value Date   CHOL 110 09/16/2021   HDL 48 (L) 09/16/2021   LDLCALC 46 09/16/2021   TRIG 75 09/16/2021   CHOLHDL 2.3 09/16/2021    Significant Diagnostic Results in last 30 days:  CT Chest Wo Contrast  Result Date: 11/24/2021 CLINICAL DATA:  Non-small-cell lung cancer.  Restaging. EXAM: CT CHEST WITHOUT CONTRAST TECHNIQUE: Multidetector CT imaging of the chest was performed following the standard protocol without IV contrast. COMPARISON:  11/24/2020 FINDINGS: Cardiovascular: The heart size is normal. No substantial pericardial effusion. Coronary artery calcification is evident. Moderate atherosclerotic calcification is noted in the wall of the thoracic aorta. Mediastinum/Nodes: No mediastinal lymphadenopathy. No evidence for gross hilar lymphadenopathy although assessment is limited by the lack of intravenous contrast on the current study. The esophagus has normal  imaging features. There is no axillary lymphadenopathy. Lungs/Pleura: Centrilobular emphsyema noted. Stable parahilar  post treatment scarring in the right lung. Stable 4 mm paraspinal left lower lobe nodule on 101/5. No new suspicious pulmonary nodule or mass. No focal airspace consolidation. No pleural effusion. Upper Abdomen: Stable cyst upper pole right kidney. Musculoskeletal: No worrisome lytic or sclerotic osseous abnormality. IMPRESSION: 1. Stable exam. No new or progressive findings to suggest recurrent or metastatic disease. 2. Stable post treatment scarring in the right parahilar lung. 3. Aortic Atherosclerosis (ICD10-I70.0) and Emphysema (ICD10-J43.9). Electronically Signed   By: Misty Stanley M.D.   On: 11/24/2021 07:37    Assessment/Plan  DM type 2 with diabetic peripheral neuropathy (HCC) Lab Results  Component Value Date   HGBA1C 10.2 (H) 09/16/2021  CBG readings have been high for the past 1 week unclear if due to recent cough which has improved. CBG 189 today. Advised to increase metformin 500 mg tablet to twice daily.  Continue to monitor CBG Notify provider if CBG> 200's consistently or < 75  Continue on ACE inhibitor Continue on Statin and ASA On Gabapentin for Neuropathy - metFORMIN (GLUCOPHAGE) 500 MG tablet; Take 1 tablet (500 mg total) by mouth 2 (two) times daily with a meal.  Dispense: 180 tablet; Refill: 0  Family/ staff Communication: Reviewed plan of care with patient and daughter verbalized understanding.   Labs/tests ordered: None   Next Appointment: 4 months for fasting labs.   I connected with  Anahy Esh Freund on 12/01/21 by a video enabled telemedicine application and verified that I am speaking with the correct person using two identifiers.   I discussed the limitations of evaluation and management by telemedicine. The patient expressed understanding and agreed to proceed.   Spent 17 minutes of non-face to face with patient  >50% time spent counseling;  reviewing medical record; labs; and developing future plan of care.  Sandrea Hughs, NP

## 2021-12-26 ENCOUNTER — Other Ambulatory Visit: Payer: Self-pay | Admitting: *Deleted

## 2021-12-26 NOTE — Patient Outreach (Signed)
[facility]) Care Management    12/26/2021   Erin Carr 10-20-1930 [id number]   Outgoing call placed to member/niece, successful.  Denies any urgent concerns, encouraged to contact this care manager with questions.    Encounter Medications:  Outpatient Encounter Medications as of 12/26/2021  Medication Sig   amLODipine (NORVASC) 10 MG tablet Take 1 tablet (10 mg total) by mouth daily.   aspirin EC 81 MG tablet Take 81 mg by mouth daily.   atorvastatin (LIPITOR) 40 MG tablet Take 1 tablet (40 mg total) by mouth daily at 6 PM.   brimonidine (ALPHAGAN) 0.2 % ophthalmic solution Place 1 drop into the left eye 2 (two) times daily.   docusate sodium (COLACE) 100 MG capsule Take 100 mg by mouth at bedtime.   furosemide (LASIX) 20 MG tablet Take 2 tablets (40 mg total) by mouth daily.   gabapentin (NEURONTIN) 100 MG capsule Take 2 capsules (200 mg total) by mouth 2 (two) times daily.   latanoprost (XALATAN) 0.005 % ophthalmic solution Place 1 drop into the left eye at bedtime.   lisinopril (PRINIVIL,ZESTRIL) 20 MG tablet Take 20 mg by mouth 2 (two) times daily.   metFORMIN (GLUCOPHAGE) 500 MG tablet Take 1 tablet (500 mg total) by mouth 2 (two) times daily with a meal.   metoprolol tartrate (LOPRESSOR) 25 MG tablet Take 1 tablet (25 mg total) by mouth 2 (two) times daily.   Netarsudil Dimesylate (RHOPRESSA) 0.02 % SOLN Place 1 drop into the left eye daily.   polyethylene glycol powder (GLYCOLAX/MIRALAX) 17 GM/SCOOP powder Take 17 g by mouth daily. Hold for loose stool   potassium chloride (KLOR-CON) 10 MEQ tablet Take 1 tablet (10 mEq total) by mouth daily.   No facility-administered encounter medications on file as of 12/26/2021.    Functional Status:  In your present state of health, do you have any difficulty performing the following activities: 06/08/2021  Hearing? N  Vision? Y  Difficulty concentrating or making decisions? Y  Walking or climbing  stairs? Y  Comment secondary to weakness  Dressing or bathing? Y  Doing errands, shopping? Y  Some recent data might be hidden    Fall/Depression Screening: Fall Risk  12/01/2021 09/12/2021 08/24/2021  Falls in the past year? 0 0 0  Number falls in past yr: 0 0 0  Injury with Fall? 0 0 0  Risk for fall due to : No Fall Risks No Fall Risks No Fall Risks  Follow up Falls evaluation completed Falls evaluation completed Falls evaluation completed   PHQ 2/9 Scores 08/24/2021 06/28/2017 06/07/2017 02/08/2017 12/06/2016  PHQ - 2 Score 0 0 0 0 0    Assessment:   Care Plan Care Plan : General Plan of Care (Adult)  Updates made by Valente David, RN since 12/26/2021 12:00 AM     Problem: Knowledge deficit of management of acute/chronic conditions evidenced by recent admission requiring stay at [facility]      Long-Range Goal: Patient will show ability to manage chronic conditions and recover from acute conditions as evidenced by no hospital readmissions in the next 6 months   Start Date: 08/24/2021  Expected End Date: 02/20/2022  This Visit's Progress: On track  Recent Progress: On track  Priority: High  Note:   Current Barriers:  Knowledge Deficits related to plan of care for management of Covid and UTI  Chronic Disease Management support and education needs related to Covid and UTI  RNCM Clinical Goal(s):  Patient will  verbalize understanding of plan for management of Covid and UTI attend all scheduled medical appointments: PCP not experience hospital admission. Hospital Admissions in last 6 months = 2  through collaboration with RN Care manager, provider, and care team.   Interventions: 1:1 collaboration with primary care provider regarding development and update of comprehensive plan of care as evidenced by provider attestation and co-signature Inter-disciplinary care team collaboration (see longitudinal plan of care) Evaluation of current treatment plan related to  self management and patient's  adherence to plan as established by provider   COVID:  (Status: Goal on track: YES.) Screening for signs and symptoms of depression; , Discussed effects, symptoms, and management of "long COVID"';, and Assessed social determinant of health barriers;   UTI  (Status: Goal on track: YES.) Evaluation of current treatment plan related to  Covid and UTI ,  self-management and patient's adherence to plan as established by provider. Discussed plans with patient for ongoing care management follow up and provided patient with direct contact information for care management team Advised patient to contact this RNCM with questions; Reviewed medications with patient and discussed importance of medication adherence; Reviewed scheduled/upcoming provider appointments including PCP;   Diabetes:  (Status: Goal on Track (progressing): YES.) Lab Results  Component Value Date   HGBA1C 10.2 (H) 09/16/2021  Assessed patient's understanding of A1c goal: <7% Provided education to patient about basic DM disease process; Reviewed medications with patient and discussed importance of medication adherence;        Reviewed prescribed diet with patient low carb/low sugar; Reviewed scheduled/upcoming provider appointments including: PCP;         Advised patient, providing education and rationale, to check cbg daily and record         Patient Goals/Self-Care Activities: Patient will attend all scheduled provider appointments Patient will continue to perform ADL's independently Patient will call provider office for new concerns or questions   Update 11/18 - Per niece, member has completed PT and OT sessions with home health, continues to regain strength.  Since restarting Metformin, state blood sugars have been much better as well as blood pressure being "stable."  Denies any hypo/hyperglycemic episodes, following diabetic plan of care.    Update 12/27 - Niece report member is doing well.  Recently had cough and  congestion, was taking diabetic robitussin for cough.  It incidentally increased member's blood sugars greater then 200, normal range 110-130's.  Niece state readings have decreased since she stopped taking the medication, today was 187.  Encouraged to continue to monitor and notify PCP if numbers done continue to decline over the next couple days.  Next PCP appointment scheduled for February.     Update 1/23 - Spoke with member and niece Judeen Hammans, state member has started again with congestion, minimal cough.  No fever or other symptoms, taking diabetic robitussin again for relief.  Virtual visit completed with PCP on 12/29, Metformin increased 500mg  twice a day for increased blood sugars and elevated A1C.  Today's blood sugar is reported 131, range in the last week is 112-130.  Report member is adherent to diabetic diet, will try Mucinex for congestion relief.  No other concerns member state she "feel good."            Plan:  Follow-up: Patient agrees to Care Plan and Follow-up. Follow-up in 1 month(s)   Valente David, RN, MSN, [place] [phone]

## 2022-01-04 ENCOUNTER — Other Ambulatory Visit: Payer: Self-pay | Admitting: Family

## 2022-01-04 ENCOUNTER — Telehealth: Payer: Self-pay | Admitting: *Deleted

## 2022-01-04 DIAGNOSIS — I5020 Unspecified systolic (congestive) heart failure: Secondary | ICD-10-CM | POA: Diagnosis not present

## 2022-01-04 DIAGNOSIS — E1169 Type 2 diabetes mellitus with other specified complication: Secondary | ICD-10-CM | POA: Diagnosis not present

## 2022-01-04 DIAGNOSIS — I25118 Atherosclerotic heart disease of native coronary artery with other forms of angina pectoris: Secondary | ICD-10-CM | POA: Diagnosis not present

## 2022-01-04 DIAGNOSIS — I10 Essential (primary) hypertension: Secondary | ICD-10-CM | POA: Diagnosis not present

## 2022-01-04 MED ORDER — METOPROLOL TARTRATE 25 MG PO TABS: 25.0000 mg | ORAL_TABLET | Freq: Two times a day (BID) | ORAL | 1 refills | Status: AC

## 2022-01-04 NOTE — Telephone Encounter (Signed)
Metoprolol 25 mg tablet twice daily refilled.

## 2022-01-04 NOTE — Telephone Encounter (Signed)
Patient caregiver called and stated that pharmacy still has old Rx on file for Metoprolol 1/2 tablet. Caregiver has been giving Metoprolol 25mg  One tablet Twice daily.   Requesting a new Rx to be sent to pharmacy.  Confirmed patient's Blood pressures have been over 130's and Heart Rate has been running 56-71  Pended Rx and sent to [place] for approval.            Ngetich, Dinah C, NP   10/06/2021 11:22 AM Note Metoprolol 25 mg tablet take 12.5 mg ( 1/2 tablet ) was changed during hospital admission.If blood pressure still in the 130-140's and Heart rate is > 60 b/min.Erin Carr continue on Metoprolol 25 mg tablet one by mouth twice daily.

## 2022-01-05 DIAGNOSIS — H401133 Primary open-angle glaucoma, bilateral, severe stage: Secondary | ICD-10-CM | POA: Diagnosis not present

## 2022-01-05 DIAGNOSIS — H2512 Age-related nuclear cataract, left eye: Secondary | ICD-10-CM | POA: Diagnosis not present

## 2022-01-05 DIAGNOSIS — E113293 Type 2 diabetes mellitus with mild nonproliferative diabetic retinopathy without macular edema, bilateral: Secondary | ICD-10-CM | POA: Diagnosis not present

## 2022-01-10 ENCOUNTER — Ambulatory Visit: Payer: Medicare Other

## 2022-01-10 ENCOUNTER — Ambulatory Visit: Payer: Medicare Other | Admitting: Podiatry

## 2022-01-20 ENCOUNTER — Ambulatory Visit: Payer: Medicare Other

## 2022-01-20 ENCOUNTER — Ambulatory Visit: Payer: PRIVATE HEALTH INSURANCE | Admitting: Podiatry

## 2022-01-23 ENCOUNTER — Other Ambulatory Visit: Payer: Self-pay | Admitting: *Deleted

## 2022-01-23 NOTE — Patient Outreach (Signed)
[facility]) Care Management Telephonic RN Care Manager Note   01/23/2022 Name:  Erin Carr MRN:  [id number] DOB:  1930/03/29  Summary: Erin Carr call placed to niece Erin Carr, successful.  Denies any urgent concerns, encouraged to contact this care manager with questions.    Recommendations/Changes made from today's visit: Use barrier cream on sacral irritation, adjust position at least every hour.  Subjective: Erin Carr is an 88 y.o. year old female who is a primary patient of Ngetich, Dinah C, NP. The care management team was consulted for assistance with care management and/or care coordination needs.    Telephonic RN Care Manager completed Telephone Visit today.  Objective:   Medications Reviewed Today     Reviewed by Otis Peak, CMA (Certified Medical Assistant) on 12/01/21 at [place] List Status: <None>   Medication Order Taking? Sig Documenting Provider Last Dose Status Informant  amLODipine (NORVASC) 10 MG tablet [id number] Yes Take 1 tablet (10 mg total) by mouth daily. Ngetich, Dinah C, NP Taking Active   aspirin EC 81 MG tablet [id number] Yes Take 81 mg by mouth daily. [provider] Taking Active Family Member  atorvastatin (LIPITOR) 40 MG tablet [id number] Yes Take 1 tablet (40 mg total) by mouth daily at 6 PM. Ngetich, Dinah C, NP Taking Active   brimonidine (ALPHAGAN) 0.2 % ophthalmic solution [id number] Yes Place 1 drop into the left eye 2 (two) times daily. [provider] Taking Active Family Member           Med Note Charlesetta Garibaldi, JASMINE E   Mon Sep 12, 2021 10:45 AM)    docusate sodium (COLACE) 100 MG capsule [id number] Yes Take 100 mg by mouth at bedtime. [provider] Taking Active   ergocalciferol (VITAMIN D2) 1.25 MG (50000 UT) capsule [id number] Yes Take 1 capsule (50,000 Units total) by mouth once a week. On Monday Ngetich, Dinah C, NP Taking Active   furosemide (LASIX) 20 MG tablet [id number] Yes Take  2 tablets (40 mg total) by mouth daily. Aline August, MD Taking Active   gabapentin (NEURONTIN) 100 MG capsule [id number] Yes Take 2 capsules (200 mg total) by mouth 2 (two) times daily. Patrecia Pour, MD Taking Active   latanoprost (XALATAN) 0.005 % ophthalmic solution [id number] Yes Place 1 drop into the left eye at bedtime. [provider] Taking Active Family Member  lisinopril (PRINIVIL,ZESTRIL) 20 MG tablet [id number] Yes Take 20 mg by mouth 2 (two) times daily. [provider] Taking Active Family Member           Med Note Charlesetta Garibaldi, Fredia Sorrow Sep 12, 2021 10:46 AM)    metFORMIN (GLUCOPHAGE) 500 MG tablet [id number] Yes Take 1 tablet (500 mg total) by mouth daily. Ngetich, Dinah C, NP Taking Active   metoprolol tartrate (LOPRESSOR) 25 MG tablet [id number] Yes Take 1 tablet (25 mg total) by mouth 2 (two) times daily. Ngetich, Dinah C, NP Taking Active   Netarsudil Dimesylate (RHOPRESSA) 0.02 % SOLN [id number] Yes Place 1 drop into the left eye daily. [provider] Taking Active   polyethylene glycol powder (GLYCOLAX/MIRALAX) 17 GM/SCOOP powder [id number] Yes Take 17 g by mouth daily. Hold for loose stool Ngetich, Dinah C, NP Taking Active   potassium chloride (KLOR-CON) 10 MEQ tablet [id number] Yes Take 1 tablet (10 mEq total) by mouth daily. Ngetich, Nelda Bucks, NP Taking Active              SDOH:  (Social  Determinants of Health) assessments and interventions performed:     Care Plan  Review of patient past medical history, allergies, medications, health status, including review of consultants reports, laboratory and other test data, was performed as part of comprehensive evaluation for care management services.   Care Plan : General Plan of Care (Adult)  Updates made by Valente David, RN since 01/23/2022 12:00 AM     Problem: Knowledge deficit of management of acute/chronic conditions evidenced by recent admission requiring stay at [facility]      Long-Range  Goal: Patient will show ability to manage chronic conditions and recover from acute conditions as evidenced by no hospital readmissions in the next 6 months   Start Date: 08/24/2021  Expected End Date: 02/20/2022  This Visit's Progress: On track  Recent Progress: On track  Priority: High  Note:   Current Barriers:  Knowledge Deficits related to plan of care for management of DMII and Covid and UTI  Chronic Disease Management support and education needs related to DMII and Covid and UTI  RNCM Clinical Goal(s):  Patient will verbalize understanding of plan for management of DMII and Gout as evidenced by Niece verbalizing accurate plan of care attend all scheduled medical appointments: PCP as evidenced by PCP        not experience hospital admission as evidenced by review of EMR. Hospital Admissions in last 6 months = 2  through collaboration with RN Care manager, provider, and care team.   Interventions: Inter-disciplinary care team collaboration (see longitudinal plan of care) Evaluation of current treatment plan related to  self management and patient's adherence to plan as established by provider   COVID:  (Status: Goal on track: YES.) Screening for signs and symptoms of depression; , Discussed effects, symptoms, and management of "long COVID"';, and Assessed social determinant of health barriers;   UTI  (Status: Goal on track: YES.) Evaluation of current treatment plan related to  Covid and UTI ,  self-management and patient's adherence to plan as established by provider. Discussed plans with patient for ongoing care management follow up and provided patient with direct contact information for care management team Advised patient to contact this RNCM with questions; Reviewed medications with patient and discussed importance of medication adherence; Reviewed scheduled/upcoming provider appointments including PCP;   Diabetes:  (Status: Goal on Track (progressing): YES.) Lab Results   Component Value Date   HGBA1C 10.2 (H) 09/16/2021  Assessed patient's understanding of A1c goal: <7% Provided education to patient about basic DM disease process; Reviewed medications with patient and discussed importance of medication adherence;        Reviewed prescribed diet with patient low carb/low sugar; Reviewed scheduled/upcoming provider appointments including: PCP;         Advised patient, providing education and rationale, to check cbg daily and record         Patient Goals/Self-Care Activities: Patient will attend all scheduled provider appointments Patient will continue to perform ADL's independently Patient will call provider office for new concerns or questions   Update 11/18 - Per niece, member has completed PT and OT sessions with home health, continues to regain strength.  Since restarting Metformin, state blood sugars have been much better as well as blood pressure being "stable."  Denies any hypo/hyperglycemic episodes, following diabetic plan of care.    Update 12/27 - Niece report member is doing well.  Recently had cough and congestion, was taking diabetic robitussin for cough.  It incidentally increased member's blood sugars greater then 200,  normal range 110-130's.  Niece state readings have decreased since she stopped taking the medication, today was 187.  Encouraged to continue to monitor and notify PCP if numbers done continue to decline over the next couple days.  Next PCP appointment scheduled for February.     Update 1/23 - Spoke with member and niece Erin Carr, state member has started again with congestion, minimal cough.  No fever or other symptoms, taking diabetic robitussin again for relief.  Virtual visit completed with PCP on 12/29, Metformin increased 500mg  twice a day for increased blood sugars and elevated A1C.  Today's blood sugar is reported 131, range in the last week is 112-130.  Report member is adherent to diabetic diet, will try Mucinex for  congestion relief.  No other concerns member state she "feel good."   Update 2/20 - Niece Erin Carr report congestion is no longer an issue.  State member is doing well, blood sugars range 104-126.  She will have repeat A1C before next PCP visit in April, 6 months post last reading.  She continues to take medications as instructed and adhere to diabetic diet.  Niece expresses some concern for small area of irritation on sacrum, will start using cream.  Advised to use cream with zinc oxide, such as desitin, to create a barrier.  She verbalizes understanding.         Plan:  Telephone follow up appointment with care management team member scheduled for:  1 month The patient has been provided with contact information for the care management team and has been advised to call with any health related questions or concerns.   Valente David, RN, MSN, [place] Manager [phone]

## 2022-01-23 NOTE — Patient Instructions (Signed)
Visit Information  Thank you for taking time to visit with me today. Please don't hesitate to contact me if I can be of assistance to you before our next scheduled telephone appointment.  Following are the goals we discussed today:  Monitor sacral skin breakdown, use cream as barrier that is high in zinc oxide (such as desitin)  Our next appointment is by telephone in 1 month  The patient verbalized understanding of instructions, educational materials, and care plan provided today and agreed to receive a mailed copy of patient instructions, educational materials, and care plan.   The patient has been provided with contact information for the care management team and has been advised to call with any health related questions or concerns.   Valente David, RN, MSN, [place] Manager [phone]

## 2022-01-28 ENCOUNTER — Emergency Department (HOSPITAL_COMMUNITY): Payer: Medicare Other

## 2022-01-28 ENCOUNTER — Inpatient Hospital Stay (HOSPITAL_COMMUNITY)
Admission: EM | Admit: 2022-01-28 | Discharge: 2022-02-01 | DRG: 344 | Disposition: E | Payer: Medicare Other | Attending: Internal Medicine | Admitting: Internal Medicine

## 2022-01-28 DIAGNOSIS — E871 Hypo-osmolality and hyponatremia: Secondary | ICD-10-CM

## 2022-01-28 DIAGNOSIS — J449 Chronic obstructive pulmonary disease, unspecified: Secondary | ICD-10-CM | POA: Diagnosis present

## 2022-01-28 DIAGNOSIS — Z79899 Other long term (current) drug therapy: Secondary | ICD-10-CM | POA: Diagnosis not present

## 2022-01-28 DIAGNOSIS — E119 Type 2 diabetes mellitus without complications: Secondary | ICD-10-CM | POA: Diagnosis not present

## 2022-01-28 DIAGNOSIS — K5900 Constipation, unspecified: Secondary | ICD-10-CM | POA: Diagnosis not present

## 2022-01-28 DIAGNOSIS — I110 Hypertensive heart disease with heart failure: Secondary | ICD-10-CM | POA: Diagnosis not present

## 2022-01-28 DIAGNOSIS — E785 Hyperlipidemia, unspecified: Secondary | ICD-10-CM | POA: Diagnosis not present

## 2022-01-28 DIAGNOSIS — E7800 Pure hypercholesterolemia, unspecified: Secondary | ICD-10-CM | POA: Diagnosis not present

## 2022-01-28 DIAGNOSIS — I509 Heart failure, unspecified: Secondary | ICD-10-CM | POA: Diagnosis not present

## 2022-01-28 DIAGNOSIS — I252 Old myocardial infarction: Secondary | ICD-10-CM

## 2022-01-28 DIAGNOSIS — I10 Essential (primary) hypertension: Secondary | ICD-10-CM | POA: Diagnosis not present

## 2022-01-28 DIAGNOSIS — I5042 Chronic combined systolic (congestive) and diastolic (congestive) heart failure: Secondary | ICD-10-CM | POA: Diagnosis present

## 2022-01-28 DIAGNOSIS — L89153 Pressure ulcer of sacral region, stage 3: Secondary | ICD-10-CM | POA: Diagnosis present

## 2022-01-28 DIAGNOSIS — Z7982 Long term (current) use of aspirin: Secondary | ICD-10-CM

## 2022-01-28 DIAGNOSIS — M810 Age-related osteoporosis without current pathological fracture: Secondary | ICD-10-CM | POA: Diagnosis present

## 2022-01-28 DIAGNOSIS — E1165 Type 2 diabetes mellitus with hyperglycemia: Secondary | ICD-10-CM | POA: Diagnosis not present

## 2022-01-28 DIAGNOSIS — Z85118 Personal history of other malignant neoplasm of bronchus and lung: Secondary | ICD-10-CM

## 2022-01-28 DIAGNOSIS — I700 Atherosclerosis of aorta: Secondary | ICD-10-CM | POA: Diagnosis not present

## 2022-01-28 DIAGNOSIS — Z833 Family history of diabetes mellitus: Secondary | ICD-10-CM

## 2022-01-28 DIAGNOSIS — Z8249 Family history of ischemic heart disease and other diseases of the circulatory system: Secondary | ICD-10-CM

## 2022-01-28 DIAGNOSIS — R109 Unspecified abdominal pain: Secondary | ICD-10-CM | POA: Diagnosis not present

## 2022-01-28 DIAGNOSIS — N182 Chronic kidney disease, stage 2 (mild): Secondary | ICD-10-CM | POA: Diagnosis not present

## 2022-01-28 DIAGNOSIS — Z66 Do not resuscitate: Secondary | ICD-10-CM | POA: Diagnosis not present

## 2022-01-28 DIAGNOSIS — Z87891 Personal history of nicotine dependence: Secondary | ICD-10-CM

## 2022-01-28 DIAGNOSIS — K6389 Other specified diseases of intestine: Secondary | ICD-10-CM | POA: Diagnosis not present

## 2022-01-28 DIAGNOSIS — I5032 Chronic diastolic (congestive) heart failure: Secondary | ICD-10-CM | POA: Diagnosis not present

## 2022-01-28 DIAGNOSIS — R1030 Lower abdominal pain, unspecified: Secondary | ICD-10-CM | POA: Diagnosis present

## 2022-01-28 DIAGNOSIS — M1990 Unspecified osteoarthritis, unspecified site: Secondary | ICD-10-CM | POA: Diagnosis not present

## 2022-01-28 DIAGNOSIS — Z515 Encounter for palliative care: Secondary | ICD-10-CM | POA: Diagnosis not present

## 2022-01-28 DIAGNOSIS — R918 Other nonspecific abnormal finding of lung field: Secondary | ICD-10-CM | POA: Diagnosis not present

## 2022-01-28 DIAGNOSIS — N183 Chronic kidney disease, stage 3 unspecified: Secondary | ICD-10-CM

## 2022-01-28 DIAGNOSIS — I2510 Atherosclerotic heart disease of native coronary artery without angina pectoris: Secondary | ICD-10-CM | POA: Diagnosis present

## 2022-01-28 DIAGNOSIS — J9601 Acute respiratory failure with hypoxia: Secondary | ICD-10-CM | POA: Diagnosis not present

## 2022-01-28 DIAGNOSIS — R1110 Vomiting, unspecified: Secondary | ICD-10-CM | POA: Diagnosis not present

## 2022-01-28 DIAGNOSIS — E1122 Type 2 diabetes mellitus with diabetic chronic kidney disease: Secondary | ICD-10-CM | POA: Diagnosis present

## 2022-01-28 DIAGNOSIS — R54 Age-related physical debility: Secondary | ICD-10-CM | POA: Diagnosis present

## 2022-01-28 DIAGNOSIS — Z7984 Long term (current) use of oral hypoglycemic drugs: Secondary | ICD-10-CM | POA: Diagnosis not present

## 2022-01-28 DIAGNOSIS — Z01810 Encounter for preprocedural cardiovascular examination: Secondary | ICD-10-CM | POA: Diagnosis not present

## 2022-01-28 DIAGNOSIS — J690 Pneumonitis due to inhalation of food and vomit: Secondary | ICD-10-CM | POA: Diagnosis not present

## 2022-01-28 DIAGNOSIS — K562 Volvulus: Secondary | ICD-10-CM | POA: Diagnosis not present

## 2022-01-28 DIAGNOSIS — N1832 Chronic kidney disease, stage 3b: Secondary | ICD-10-CM | POA: Diagnosis present

## 2022-01-28 DIAGNOSIS — L8990 Pressure ulcer of unspecified site, unspecified stage: Secondary | ICD-10-CM | POA: Insufficient documentation

## 2022-01-28 DIAGNOSIS — Z20822 Contact with and (suspected) exposure to covid-19: Secondary | ICD-10-CM | POA: Diagnosis not present

## 2022-01-28 DIAGNOSIS — Z8673 Personal history of transient ischemic attack (TIA), and cerebral infarction without residual deficits: Secondary | ICD-10-CM

## 2022-01-28 DIAGNOSIS — T17908A Unspecified foreign body in respiratory tract, part unspecified causing other injury, initial encounter: Secondary | ICD-10-CM

## 2022-01-28 DIAGNOSIS — J984 Other disorders of lung: Secondary | ICD-10-CM | POA: Diagnosis not present

## 2022-01-28 DIAGNOSIS — I130 Hypertensive heart and chronic kidney disease with heart failure and stage 1 through stage 4 chronic kidney disease, or unspecified chronic kidney disease: Secondary | ICD-10-CM | POA: Diagnosis not present

## 2022-01-28 DIAGNOSIS — K56609 Unspecified intestinal obstruction, unspecified as to partial versus complete obstruction: Secondary | ICD-10-CM

## 2022-01-28 DIAGNOSIS — Z923 Personal history of irradiation: Secondary | ICD-10-CM

## 2022-01-28 LAB — CBC WITH DIFFERENTIAL/PLATELET
Abs Immature Granulocytes: 0.06 10*3/uL (ref 0.00–0.07)
Basophils Absolute: 0 10*3/uL (ref 0.0–0.1)
Basophils Relative: 0 %
Eosinophils Absolute: 0.2 10*3/uL (ref 0.0–0.5)
Eosinophils Relative: 2 %
HCT: 40.9 % (ref 36.0–46.0)
Hemoglobin: 12.3 g/dL (ref 12.0–15.0)
Immature Granulocytes: 1 %
Lymphocytes Relative: 10 %
Lymphs Abs: 1.1 10*3/uL (ref 0.7–4.0)
MCH: 26.1 pg (ref 26.0–34.0)
MCHC: 30.1 g/dL (ref 30.0–36.0)
MCV: 86.8 fL (ref 80.0–100.0)
Monocytes Absolute: 0.6 10*3/uL (ref 0.1–1.0)
Monocytes Relative: 6 %
Neutro Abs: 9 10*3/uL — ABNORMAL HIGH (ref 1.7–7.7)
Neutrophils Relative %: 81 %
Platelets: 249 10*3/uL (ref 150–400)
RBC: 4.71 MIL/uL (ref 3.87–5.11)
RDW: 14.8 % (ref 11.5–15.5)
WBC: 11 10*3/uL — ABNORMAL HIGH (ref 4.0–10.5)
nRBC: 0 % (ref 0.0–0.2)

## 2022-01-28 LAB — LIPASE, BLOOD: Lipase: 93 U/L — ABNORMAL HIGH (ref 11–51)

## 2022-01-28 LAB — COMPREHENSIVE METABOLIC PANEL
ALT: 14 U/L (ref 0–44)
AST: 21 U/L (ref 15–41)
Albumin: 4.2 g/dL (ref 3.5–5.0)
Alkaline Phosphatase: 92 U/L (ref 38–126)
Anion gap: 9 (ref 5–15)
BUN: 18 mg/dL (ref 8–23)
CO2: 24 mmol/L (ref 22–32)
Calcium: 9.8 mg/dL (ref 8.9–10.3)
Chloride: 97 mmol/L — ABNORMAL LOW (ref 98–111)
Creatinine, Ser: 1.18 mg/dL — ABNORMAL HIGH (ref 0.44–1.00)
GFR, Estimated: 44 mL/min — ABNORMAL LOW (ref >60)
Glucose, Bld: 344 mg/dL — ABNORMAL HIGH (ref 70–99)
Potassium: 4.4 mmol/L (ref 3.5–5.1)
Sodium: 130 mmol/L — ABNORMAL LOW (ref 135–145)
Total Bilirubin: 0.4 mg/dL (ref 0.3–1.2)
Total Protein: 7.9 g/dL (ref 6.5–8.1)

## 2022-01-28 LAB — MAGNESIUM: Magnesium: 2 mg/dL (ref 1.7–2.4)

## 2022-01-28 NOTE — ED Triage Notes (Signed)
Pt c/o constipation x1wk. Miralax, OTC stool softeners, home remedies unhelpful. Endorses associated abd pain. Pt reports not passing flatus x1 day.

## 2022-01-28 NOTE — ED Provider Triage Note (Signed)
Emergency Medicine Provider Triage Evaluation Note  Erin Carr , a 88 y.o. female  was evaluated in triage.  Pt complains of constipation.  States that she has not had a bowel movement for 1 week.  She has been taking MiraLAX as well as stool softeners daily without relief.  Reports associated abdominal pain.  Denies any flatulence for the past 24 hours.  Denies any chest pain or any urinary complaints.  Physical Exam  BP (!) 184/81 (BP Location: Right Arm)    Pulse 71    Temp 98.5 F (36.9 C) (Oral)    Resp 17    SpO2 99%  Gen:   Awake, no distress   Resp:  Normal effort  MSK:   Moves extremities without difficulty  Other:    Medical Decision Making  Medically screening exam initiated at 11:10 PM.  Appropriate orders placed.  Erin Carr was informed that the remainder of the evaluation will be completed by another provider, this initial triage assessment does not replace that evaluation, and the importance of remaining in the ED until their evaluation is complete.   Rayna Sexton, PA-C 01/23/2022 2311

## 2022-01-29 ENCOUNTER — Encounter (HOSPITAL_COMMUNITY): Payer: Self-pay | Admitting: Family Medicine

## 2022-01-29 ENCOUNTER — Inpatient Hospital Stay (HOSPITAL_COMMUNITY): Payer: Medicare Other

## 2022-01-29 ENCOUNTER — Inpatient Hospital Stay (HOSPITAL_COMMUNITY): Payer: Medicare Other | Admitting: Anesthesiology

## 2022-01-29 ENCOUNTER — Emergency Department (HOSPITAL_COMMUNITY): Payer: Medicare Other

## 2022-01-29 ENCOUNTER — Encounter (HOSPITAL_COMMUNITY): Admission: EM | Disposition: E | Payer: Self-pay | Source: Home / Self Care | Attending: Internal Medicine

## 2022-01-29 DIAGNOSIS — K562 Volvulus: Secondary | ICD-10-CM | POA: Diagnosis present

## 2022-01-29 DIAGNOSIS — I5032 Chronic diastolic (congestive) heart failure: Secondary | ICD-10-CM | POA: Diagnosis present

## 2022-01-29 DIAGNOSIS — J9601 Acute respiratory failure with hypoxia: Secondary | ICD-10-CM | POA: Diagnosis not present

## 2022-01-29 DIAGNOSIS — I252 Old myocardial infarction: Secondary | ICD-10-CM | POA: Diagnosis not present

## 2022-01-29 DIAGNOSIS — M1990 Unspecified osteoarthritis, unspecified site: Secondary | ICD-10-CM | POA: Diagnosis present

## 2022-01-29 DIAGNOSIS — R54 Age-related physical debility: Secondary | ICD-10-CM | POA: Diagnosis present

## 2022-01-29 DIAGNOSIS — I130 Hypertensive heart and chronic kidney disease with heart failure and stage 1 through stage 4 chronic kidney disease, or unspecified chronic kidney disease: Secondary | ICD-10-CM | POA: Diagnosis present

## 2022-01-29 DIAGNOSIS — L89153 Pressure ulcer of sacral region, stage 3: Secondary | ICD-10-CM | POA: Diagnosis present

## 2022-01-29 DIAGNOSIS — E1122 Type 2 diabetes mellitus with diabetic chronic kidney disease: Secondary | ICD-10-CM | POA: Diagnosis present

## 2022-01-29 DIAGNOSIS — J690 Pneumonitis due to inhalation of food and vomit: Secondary | ICD-10-CM | POA: Diagnosis not present

## 2022-01-29 DIAGNOSIS — I110 Hypertensive heart disease with heart failure: Secondary | ICD-10-CM | POA: Diagnosis not present

## 2022-01-29 DIAGNOSIS — E7800 Pure hypercholesterolemia, unspecified: Secondary | ICD-10-CM | POA: Diagnosis present

## 2022-01-29 DIAGNOSIS — Z20822 Contact with and (suspected) exposure to covid-19: Secondary | ICD-10-CM | POA: Diagnosis present

## 2022-01-29 DIAGNOSIS — Z515 Encounter for palliative care: Secondary | ICD-10-CM | POA: Diagnosis not present

## 2022-01-29 DIAGNOSIS — M810 Age-related osteoporosis without current pathological fracture: Secondary | ICD-10-CM | POA: Diagnosis present

## 2022-01-29 DIAGNOSIS — Z833 Family history of diabetes mellitus: Secondary | ICD-10-CM | POA: Diagnosis not present

## 2022-01-29 DIAGNOSIS — Z66 Do not resuscitate: Secondary | ICD-10-CM | POA: Diagnosis not present

## 2022-01-29 DIAGNOSIS — J984 Other disorders of lung: Secondary | ICD-10-CM | POA: Diagnosis not present

## 2022-01-29 DIAGNOSIS — R109 Unspecified abdominal pain: Secondary | ICD-10-CM | POA: Diagnosis not present

## 2022-01-29 DIAGNOSIS — I509 Heart failure, unspecified: Secondary | ICD-10-CM

## 2022-01-29 DIAGNOSIS — E1165 Type 2 diabetes mellitus with hyperglycemia: Secondary | ICD-10-CM | POA: Diagnosis not present

## 2022-01-29 DIAGNOSIS — J449 Chronic obstructive pulmonary disease, unspecified: Secondary | ICD-10-CM | POA: Diagnosis present

## 2022-01-29 DIAGNOSIS — Z8249 Family history of ischemic heart disease and other diseases of the circulatory system: Secondary | ICD-10-CM | POA: Diagnosis not present

## 2022-01-29 DIAGNOSIS — Z87891 Personal history of nicotine dependence: Secondary | ICD-10-CM | POA: Diagnosis not present

## 2022-01-29 DIAGNOSIS — Z85118 Personal history of other malignant neoplasm of bronchus and lung: Secondary | ICD-10-CM | POA: Diagnosis not present

## 2022-01-29 DIAGNOSIS — N1832 Chronic kidney disease, stage 3b: Secondary | ICD-10-CM | POA: Diagnosis present

## 2022-01-29 DIAGNOSIS — R918 Other nonspecific abnormal finding of lung field: Secondary | ICD-10-CM | POA: Diagnosis not present

## 2022-01-29 DIAGNOSIS — Z7982 Long term (current) use of aspirin: Secondary | ICD-10-CM | POA: Diagnosis not present

## 2022-01-29 DIAGNOSIS — N183 Chronic kidney disease, stage 3 unspecified: Secondary | ICD-10-CM

## 2022-01-29 DIAGNOSIS — L8990 Pressure ulcer of unspecified site, unspecified stage: Secondary | ICD-10-CM | POA: Insufficient documentation

## 2022-01-29 DIAGNOSIS — R1030 Lower abdominal pain, unspecified: Secondary | ICD-10-CM | POA: Diagnosis present

## 2022-01-29 DIAGNOSIS — Z79899 Other long term (current) drug therapy: Secondary | ICD-10-CM | POA: Diagnosis not present

## 2022-01-29 DIAGNOSIS — E871 Hypo-osmolality and hyponatremia: Secondary | ICD-10-CM

## 2022-01-29 DIAGNOSIS — I700 Atherosclerosis of aorta: Secondary | ICD-10-CM | POA: Diagnosis not present

## 2022-01-29 DIAGNOSIS — Z7984 Long term (current) use of oral hypoglycemic drugs: Secondary | ICD-10-CM | POA: Diagnosis not present

## 2022-01-29 HISTORY — PX: FLEXIBLE SIGMOIDOSCOPY: SHX5431

## 2022-01-29 HISTORY — PX: BOWEL DECOMPRESSION: SHX5532

## 2022-01-29 LAB — BASIC METABOLIC PANEL
Anion gap: 13 (ref 5–15)
BUN: 19 mg/dL (ref 8–23)
CO2: 21 mmol/L — ABNORMAL LOW (ref 22–32)
Calcium: 9.6 mg/dL (ref 8.9–10.3)
Chloride: 99 mmol/L (ref 98–111)
Creatinine, Ser: 1.1 mg/dL — ABNORMAL HIGH (ref 0.44–1.00)
GFR, Estimated: 48 mL/min — ABNORMAL LOW (ref >60)
Glucose, Bld: 303 mg/dL — ABNORMAL HIGH (ref 70–99)
Potassium: 3.7 mmol/L (ref 3.5–5.1)
Sodium: 133 mmol/L — ABNORMAL LOW (ref 135–145)

## 2022-01-29 LAB — CBC
HCT: 40.7 % (ref 36.0–46.0)
Hemoglobin: 12.6 g/dL (ref 12.0–15.0)
MCH: 26.4 pg (ref 26.0–34.0)
MCHC: 31 g/dL (ref 30.0–36.0)
MCV: 85.3 fL (ref 80.0–100.0)
Platelets: 237 10*3/uL (ref 150–400)
RBC: 4.77 MIL/uL (ref 3.87–5.11)
RDW: 14.8 % (ref 11.5–15.5)
WBC: 13.6 10*3/uL — ABNORMAL HIGH (ref 4.0–10.5)
nRBC: 0 % (ref 0.0–0.2)

## 2022-01-29 LAB — GLUCOSE, CAPILLARY
Glucose-Capillary: 302 mg/dL — ABNORMAL HIGH (ref 70–99)
Glucose-Capillary: 310 mg/dL — ABNORMAL HIGH (ref 70–99)
Glucose-Capillary: 270 mg/dL — ABNORMAL HIGH (ref 70–99)
Glucose-Capillary: 175 mg/dL — ABNORMAL HIGH (ref 70–99)
Glucose-Capillary: 99 mg/dL (ref 70–99)
Glucose-Capillary: 155 mg/dL — ABNORMAL HIGH (ref 70–99)

## 2022-01-29 LAB — RESP PANEL BY RT-PCR (FLU A&B, COVID) ARPGX2
Influenza A by PCR: NEGATIVE
Influenza B by PCR: NEGATIVE
SARS Coronavirus 2 by RT PCR: NEGATIVE

## 2022-01-29 LAB — HEMOGLOBIN A1C
Hgb A1c MFr Bld: 8.2 % — ABNORMAL HIGH (ref 4.8–5.6)
Mean Plasma Glucose: 188.64 mg/dL

## 2022-01-29 SURGERY — SIGMOIDOSCOPY, FLEXIBLE
Anesthesia: Monitor Anesthesia Care

## 2022-01-29 MED ORDER — HYDRALAZINE HCL 20 MG/ML IJ SOLN
INTRAMUSCULAR | Status: DC | PRN
  Administered 2022-01-29 (×2): 5 mg via INTRAVENOUS

## 2022-01-29 MED ORDER — HYDROMORPHONE HCL 1 MG/ML IJ SOLN: 0.5000 mg | INTRAMUSCULAR | Status: DC | PRN

## 2022-01-29 MED ORDER — IOHEXOL 300 MG/ML  SOLN
75.0000 mL | Freq: Once | INTRAMUSCULAR | Status: AC | PRN
  Administered 2022-01-29: 75 mL via INTRAVENOUS

## 2022-01-29 MED ORDER — LACTATED RINGERS IV SOLN: INTRAVENOUS | Status: DC

## 2022-01-29 MED ORDER — SODIUM CHLORIDE 0.9 % IV SOLN: INTRAVENOUS | Status: DC

## 2022-01-29 MED ORDER — SODIUM CHLORIDE (PF) 0.9 % IJ SOLN
INTRAMUSCULAR | Status: AC
  Administered 2022-01-29: 10 mL
  Filled 2022-01-29: qty 10

## 2022-01-29 MED ORDER — PROPOFOL 10 MG/ML IV BOLUS
INTRAVENOUS | Status: DC | PRN
Start: 2022-01-29 — End: 2022-01-29
  Administered 2022-01-29: 25 mg via INTRAVENOUS

## 2022-01-29 MED ORDER — INSULIN ASPART 100 UNIT/ML IJ SOLN
8.0000 [IU] | Freq: Once | INTRAMUSCULAR | Status: AC
  Administered 2022-01-29: 8 [IU] via SUBCUTANEOUS

## 2022-01-29 MED ORDER — LATANOPROST 0.005 % OP SOLN
1.0000 [drp] | Freq: Every day | OPHTHALMIC | Status: DC
  Administered 2022-01-29: 1 [drp] via OPHTHALMIC
  Filled 2022-01-29: qty 2.5

## 2022-01-29 MED ORDER — PROPOFOL 500 MG/50ML IV EMUL
INTRAVENOUS | Status: DC | PRN
  Administered 2022-01-29: 50 ug/kg/min via INTRAVENOUS

## 2022-01-29 MED ORDER — LIDOCAINE HCL (CARDIAC) PF 50 MG/5ML IV SOSY
PREFILLED_SYRINGE | INTRAVENOUS | Status: DC | PRN
  Administered 2022-01-29: 40 mg via INTRAVENOUS

## 2022-01-29 MED ORDER — PANTOPRAZOLE SODIUM 40 MG IV SOLR
40.0000 mg | Freq: Two times a day (BID) | INTRAVENOUS | Status: DC
  Administered 2022-01-29 – 2022-01-30 (×3): 40 mg via INTRAVENOUS
  Filled 2022-01-29 (×3): qty 10

## 2022-01-29 MED ORDER — INSULIN ASPART 100 UNIT/ML IJ SOLN: 0.0000 [IU] | Freq: Three times a day (TID) | INTRAMUSCULAR | Status: DC | PRN

## 2022-01-29 MED ORDER — FENTANYL CITRATE PF 50 MCG/ML IJ SOSY
50.0000 ug | PREFILLED_SYRINGE | Freq: Once | INTRAMUSCULAR | Status: AC
  Administered 2022-01-29: 50 ug via INTRAVENOUS
  Filled 2022-01-29: qty 1

## 2022-01-29 MED ORDER — ONDANSETRON HCL 4 MG/2ML IJ SOLN: 4.0000 mg | Freq: Four times a day (QID) | INTRAMUSCULAR | Status: DC | PRN

## 2022-01-29 MED ORDER — BRIMONIDINE TARTRATE 0.2 % OP SOLN
1.0000 [drp] | Freq: Two times a day (BID) | OPHTHALMIC | Status: DC
  Administered 2022-01-29 – 2022-01-30 (×2): 1 [drp] via OPHTHALMIC
  Filled 2022-01-29: qty 5

## 2022-01-29 MED ORDER — BRIMONIDINE TARTRATE 0.2 % OP SOLN: 1.0000 [drp] | Freq: Two times a day (BID) | OPHTHALMIC | Status: DC

## 2022-01-29 MED ORDER — AMLODIPINE BESYLATE 10 MG PO TABS
10.0000 mg | ORAL_TABLET | Freq: Every day | ORAL | Status: DC
  Administered 2022-01-30: 10 mg via ORAL
  Filled 2022-01-29 (×2): qty 1

## 2022-01-29 MED ORDER — ONDANSETRON HCL 4 MG PO TABS: 4.0000 mg | ORAL_TABLET | Freq: Four times a day (QID) | ORAL | Status: DC | PRN

## 2022-01-29 MED ORDER — LACTATED RINGERS IV SOLN
INTRAVENOUS | Status: AC | PRN
  Administered 2022-01-29: 10 mL/h via INTRAVENOUS

## 2022-01-29 MED ORDER — INSULIN ASPART 100 UNIT/ML IJ SOLN
10.0000 [IU] | Freq: Once | INTRAMUSCULAR | Status: AC
  Administered 2022-01-29: 10 [IU] via SUBCUTANEOUS

## 2022-01-29 MED ORDER — ACETAMINOPHEN 325 MG PO TABS
650.0000 mg | ORAL_TABLET | Freq: Four times a day (QID) | ORAL | Status: DC | PRN
  Administered 2022-01-30: 650 mg via ORAL
  Filled 2022-01-29: qty 2

## 2022-01-29 MED ORDER — ONDANSETRON HCL 4 MG/2ML IJ SOLN
INTRAMUSCULAR | Status: DC | PRN
  Administered 2022-01-29: 4 mg via INTRAVENOUS

## 2022-01-29 MED ORDER — METOPROLOL TARTRATE 5 MG/5ML IV SOLN: 5.0000 mg | Freq: Once | INTRAVENOUS | Status: DC

## 2022-01-29 MED ORDER — SODIUM CHLORIDE 0.9 % IV BOLUS
500.0000 mL | Freq: Once | INTRAVENOUS | Status: AC
  Administered 2022-01-29: 500 mL via INTRAVENOUS

## 2022-01-29 MED ORDER — ACETAMINOPHEN 650 MG RE SUPP: 650.0000 mg | Freq: Four times a day (QID) | RECTAL | Status: DC | PRN

## 2022-01-29 MED ORDER — METOPROLOL TARTRATE 25 MG PO TABS
25.0000 mg | ORAL_TABLET | Freq: Two times a day (BID) | ORAL | Status: DC
  Administered 2022-01-29 – 2022-01-30 (×2): 25 mg via ORAL
  Filled 2022-01-29 (×2): qty 1

## 2022-01-29 MED ORDER — INSULIN ASPART 100 UNIT/ML IJ SOLN
0.0000 [IU] | Freq: Three times a day (TID) | INTRAMUSCULAR | Status: DC
  Administered 2022-01-29: 3 [IU] via SUBCUTANEOUS
  Administered 2022-01-30: 11 [IU] via SUBCUTANEOUS

## 2022-01-29 NOTE — ED Notes (Signed)
Per EDP and surgery consult soap suds enema is being held at this time

## 2022-01-29 NOTE — Anesthesia Procedure Notes (Signed)
Procedure Name: MAC Date/Time: 01/16/2022 6:45 AM Performed by: Suzy Bouchard, CRNA Pre-anesthesia Checklist: Patient identified, Emergency Drugs available, Suction available and Patient being monitored Patient Re-evaluated:Patient Re-evaluated prior to induction Oxygen Delivery Method: Simple face mask Preoxygenation: Pre-oxygenation with 100% oxygen Induction Type: IV induction Placement Confirmation: positive ETCO2 and breath sounds checked- equal and bilateral Dental Injury: Teeth and Oropharynx as per pre-operative assessment

## 2022-01-29 NOTE — Anesthesia Postprocedure Evaluation (Signed)
Anesthesia Post Note  Patient: Erin Carr  Procedure(s) Performed: FLEXIBLE SIGMOIDOSCOPY BOWEL DECOMPRESSION     Patient location during evaluation: Endoscopy Anesthesia Type: MAC Level of consciousness: awake and alert Pain management: pain level controlled Vital Signs Assessment: post-procedure vital signs reviewed and stable Respiratory status: spontaneous breathing, nonlabored ventilation, respiratory function stable and patient connected to nasal cannula oxygen Cardiovascular status: stable and blood pressure returned to baseline Postop Assessment: no apparent nausea or vomiting Anesthetic complications: no   No notable events documented.  Last Vitals:  Vitals:   01/24/2022 1139 01/25/2022 1600  BP: (!) 159/72 (!) 162/74  Pulse: 82 82  Resp: 18 18  Temp: 36.4 C 36.9 C  SpO2: 98% 96%    Last Pain:  Vitals:   01/15/2022 1600  TempSrc: Oral  PainSc:                  March Rummage Lasean Rahming

## 2022-01-29 NOTE — Assessment & Plan Note (Addendum)
Resume metoprolol and Norvasc. Hold lisinopril and Lasix for now.

## 2022-01-29 NOTE — ED Provider Notes (Signed)
[facility] EMERGENCY DEPARTMENT Provider Note   CSN: [id number] Arrival date & time: 01/11/2022  2247     History  Chief Complaint  Patient presents with   Abdominal Pain   Constipation    Erin Carr is a 88 y.o. female.  The history is provided by the patient, a relative and medical records.  Abdominal Pain Associated symptoms: constipation   Constipation Associated symptoms: abdominal pain   Erin Carr is a 88 y.o. female who presents to the Emergency Department complaining of constipation.  She presents to the ED accompanied by her niece for evaluation of constipation for the last 7 days.  Unable to pass flatus.  Has lower abdominal pain that started this morning.    No fever.  Started vomiting this evening.  No dysuria.  Takes miralax and a stool softener daily.  Took prune juice and an extra laxative today.  Has good appetite.  Lives alone but niece lives next door.    S/p hysterectomy.  Hx/o COPD, stage 1 adenocarcinoma of the lung, DM, HTN.     Home Medications Prior to Admission medications   Medication Sig Start Date End Date Taking? Authorizing Provider  amLODipine (NORVASC) 10 MG tablet Take 1 tablet (10 mg total) by mouth daily. 09/12/21   Ngetich, Dinah C, NP  aspirin EC 81 MG tablet Take 81 mg by mouth daily.    [provider]  atorvastatin (LIPITOR) 40 MG tablet Take 1 tablet (40 mg total) by mouth daily at 6 PM. 09/12/21 12/11/21  Ngetich, Dinah C, NP  brimonidine (ALPHAGAN) 0.2 % ophthalmic solution Place 1 drop into the left eye 2 (two) times daily.    [provider]  docusate sodium (COLACE) 100 MG capsule Take 100 mg by mouth at bedtime.    [provider]  furosemide (LASIX) 20 MG tablet TAKE 3 TABLETS BY MOUTH EVERY MORNING AND EVERY EVENING 01/04/22   Ngetich, Dinah C, NP  gabapentin (NEURONTIN) 100 MG capsule Take 2 capsules (200 mg total) by mouth 2 (two) times daily. 06/10/21   Patrecia Pour, MD   latanoprost (XALATAN) 0.005 % ophthalmic solution Place 1 drop into the left eye at bedtime. 10/16/20   [provider]  lisinopril (PRINIVIL,ZESTRIL) 20 MG tablet Take 20 mg by mouth 2 (two) times daily.    [provider]  metFORMIN (GLUCOPHAGE) 500 MG tablet Take 1 tablet (500 mg total) by mouth 2 (two) times daily with a meal. 12/01/21 03/01/22  Ngetich, Dinah C, NP  metoprolol tartrate (LOPRESSOR) 25 MG tablet Take 1 tablet (25 mg total) by mouth 2 (two) times daily. 01/04/22   Ngetich, Dinah C, NP  Netarsudil Dimesylate (RHOPRESSA) 0.02 % SOLN Place 1 drop into the left eye daily.    [provider]  polyethylene glycol powder (GLYCOLAX/MIRALAX) 17 GM/SCOOP powder Take 17 g by mouth daily. Hold for loose stool 09/12/21   Ngetich, Dinah C, NP  potassium chloride (KLOR-CON) 10 MEQ tablet Take 1 tablet (10 mEq total) by mouth daily. 09/12/21   Ngetich, Nelda Bucks, NP      Allergies    Patient has no known allergies.    Review of Systems   Review of Systems  Gastrointestinal:  Positive for abdominal pain and constipation.  All other systems reviewed and are negative.  Physical Exam Updated Vital Signs BP (!) 191/76    Pulse 75    Temp 98.5 F (36.9 C) (Oral)    Resp 20  SpO2 100%  Physical Exam Vitals and nursing note reviewed.  Constitutional:      Appearance: She is well-developed.  HENT:     Head: Normocephalic and atraumatic.  Cardiovascular:     Rate and Rhythm: Normal rate and regular rhythm.  Pulmonary:     Effort: Pulmonary effort is normal.     Breath sounds: Normal breath sounds.  Abdominal:     General: There is distension.     Tenderness: There is abdominal tenderness. There is no guarding or rebound.     Comments: Mild to moderate lower abdominal tenderness  Musculoskeletal:        General: No tenderness.  Skin:    General: Skin is warm and dry.  Neurological:     Mental Status: She is alert and oriented to person, place, and time.   Psychiatric:        Behavior: Behavior normal.    ED Results / Procedures / Treatments   Labs (all labs ordered are listed, but only abnormal results are displayed) Labs Reviewed  COMPREHENSIVE METABOLIC PANEL - Abnormal; Notable for the following components:      Result Value   Sodium 130 (*)    Chloride 97 (*)    Glucose, Bld 344 (*)    Creatinine, Ser 1.18 (*)    GFR, Estimated 44 (*)    All other components within normal limits  CBC WITH DIFFERENTIAL/PLATELET - Abnormal; Notable for the following components:   WBC 11.0 (*)    Neutro Abs 9.0 (*)    All other components within normal limits  LIPASE, BLOOD - Abnormal; Notable for the following components:   Lipase 93 (*)    All other components within normal limits  MAGNESIUM  URINALYSIS, ROUTINE W REFLEX MICROSCOPIC    EKG EKG Interpretation  Date/Time:  Sunday January 29 2022 01:04:57 EST Ventricular Rate:  75 PR Interval:  179 QRS Duration: 77 QT Interval:  392 QTC Calculation: 438 R Axis:   4 Text Interpretation: Sinus rhythm Abnormal R-wave progression, early transition Confirmed by Quintella Reichert [phone]) on 01/19/2022 1:19:14 AM  Radiology DG Abd 2 Views  Result Date: 01/27/2022 CLINICAL DATA:  Constipation EXAM: ABDOMEN - 2 VIEW COMPARISON:  08/29/2005 FINDINGS: Large stool burden throughout the colon. Gaseous distention of the sigmoid colon which extends into the upper abdomen. No free air or organomegaly. Visualized lung bases clear. IMPRESSION: Gaseous distention of the sigmoid colon. Cannot exclude sigmoid volvulus. Large stool burden throughout the colon. Abdominal CT may be helpful for further evaluation. Electronically Signed   By: Rolm Baptise M.D.   On:  23:40    Procedures Procedures   CRITICAL CARE Performed by: Quintella Reichert   Total critical care time: 35 minutes  Critical care time was exclusive of separately billable procedures and treating other patients.  Critical care was  necessary to treat or prevent imminent or life-threatening deterioration.  Critical care was time spent personally by me on the following activities: development of treatment plan with patient and/or surrogate as well as nursing, discussions with consultants, evaluation of patient's response to treatment, examination of patient, obtaining history from patient or surrogate, ordering and performing treatments and interventions, ordering and review of laboratory studies, ordering and review of radiographic studies, pulse oximetry and re-evaluation of patient's condition.  Medications Ordered in ED Medications - No data to display  ED Course/ Medical Decision Making/ A&P  Medical Decision Making Amount and/or Complexity of Data Reviewed Radiology: ordered.  Risk Prescription drug management. Decision regarding hospitalization.   Patient here for evaluation of lower abdominal pain that started today, 7 days of constipation.  She does have tenderness on examination without peritonitis.  CBC with mild leukocytosis.  Plain films of the abdomen concerning for volvulus.  CT abdomen pelvis was obtained, which is consistent with sigmoid volvulus.  Discussed findings of studies with radiology.  Discussed with Dr. Lyndel Safe with GI.  He recommends attempting enema in the emergency department, will see the patient in consult.  Discussed with Dr. Zenia Resides with general surgery, she will see the patient in consult.  Discussed with patient and niece findings of studies and recommendation for admission and they are in agreement with treatment plan.  Hospitalist consulted for admission.        Final Clinical Impression(s) / ED Diagnoses Final diagnoses:  Sigmoid volvulus [facility])    Rx / [facility] Orders ED Discharge Orders     None         Quintella Reichert, MD 01/14/2022 [phone]

## 2022-01-29 NOTE — Assessment & Plan Note (Addendum)
Setting of poor oral intake.  Continue with IV fluids. Component of  pseudohyponatremia due to hyperglycemia

## 2022-01-29 NOTE — Hospital Course (Addendum)
88 year old past medical history significant for diabetes type 2, COPD, heart failure preserved ejection fraction CKD stage III, hypertension, history of adenocarcinoma of right lung status post treatment, presenting for evaluation of worsening abdominal pain.  She has been constipated since last week.  She has a past medical history significant for exploratory laparotomy and additional lysis in 2006, status post hysterectomy.  CT abdomen and pelvis consistent with sigmoid volvulus. GI and surgery consulted.  Patient underwent flexible sigmoidoscopy with decompression of sigmoid volvulus.   Patient had aspiration event while eating clear liquid diet. Chest x ray consistent with PNA> Made NPO, started on Nebulizer. Chest x ray: New patchy opacities through both  lungs. She was started on IV zosyn, placed on NB mask. CCM consulted due to acute hypoxic respiratory failure. She was made DNR. Respiratory failure continue to progress, she became non responsive,  develops fixed upward gaze. Discussed with family at bedside, due to progressive respiratory failure, plan was to transition patient  to comfort measure. Comfort measure medications ordered. Patient died that night at 8:28 PM. Family was at bedside.

## 2022-01-29 NOTE — Op Note (Signed)
[facility] Patient Name: Erin Carr Procedure Date : 01/14/2022 MRN: [id number] Attending MD: Jackquline Denmark , MD Date of Birth: 03/08/1930 CSN: [id number] Age: 88 Admit Type: Outpatient Procedure:                Flexible Sigmoidoscopy with decompression. Indications:              For therapy of volvulus-unprepped Providers:                Jackquline Denmark, MD, Jeanella Cara, RN,                            William Dalton, Technician Referring MD:              Medicines:                Monitored Anesthesia Care Complications:            No immediate complications. Estimated Blood Loss:     Estimated blood loss: none. Procedure:                Pre-Anesthesia Assessment:                           - Prior to the procedure, a History and Physical                            was performed, and patient medications and                            allergies were reviewed. The patient's tolerance of                            previous anesthesia was also reviewed. The risks                            and benefits of the procedure and the sedation                            options and risks were discussed with the patient.                            All questions were answered, and informed consent                            was obtained. Prior Anticoagulants: The patient has                            taken no previous anticoagulant or antiplatelet                            agents. ASA Grade Assessment: III - A patient with                            severe systemic disease. After reviewing the risks  and benefits, the patient was deemed in                            satisfactory condition to undergo the procedure.                           After obtaining informed consent, the scope was                            passed under direct vision. The PCF-HQ190L                            ([id number]) Olympus peds colonscope was introduced                             through the anus and advanced to the the descending                            colon. The flexible sigmoidoscopy was accomplished                            without difficulty. The patient tolerated the                            procedure well. The quality of the bowel                            preparation was poor. Scope In: 6:48:48 AM Scope Out: 6:56:55 AM Total Procedure Duration: 0 hours 8 minutes 7 seconds  Findings:      A volvulus with viable appearing mucosa was found in the mid sigmoid       colon. Decompression of the volvulus was attempted and was successful,       with complete decompression achieved. Estimated blood loss: none. Mod-       severe melanosis coli was noted. Impression:               - Preparation of the colon was poor.                           - Sigmoid volvulus. Successful complete                            decompression achieved.                           - Moderate to severe melanosis coli. No obvious                            ischemic bowel                           - No specimens collected. Recommendation:           - X-ray KUB 2V stat and than in AM                           -  Clear liquid diet. Advance as tolerated.                           - Return patient to hospital ward for ongoing care.                           - Miralax 1 capful (17 grams) in 8 ounces of water                            PO daily.                           - There is a higher chances of recurrence in                            future. Appreciate surgical consultation.                           - The findings and recommendations were discussed                            with the patient's family. Procedure Code(s):        --- Professional ---                           [phone], Sigmoidoscopy, flexible; with decompression                            (for pathologic distention) (eg, volvulus,                            megacolon), including placement of decompression                             tube, when performed Diagnosis Code(s):        --- Professional ---                           K56.2, Volvulus CPT copyright 2019 [employer]. All rights reserved. The codes documented in this report are preliminary and upon coder review may  be revised to meet current compliance requirements. Jackquline Denmark, MD 01/09/2022 7:08:02 AM This report has been signed electronically. Number of Addenda: 0

## 2022-01-29 NOTE — Transfer of Care (Signed)
Immediate Anesthesia Transfer of Care Note  Patient: ISAAC LACSON  Procedure(s) Performed: FLEXIBLE SIGMOIDOSCOPY BOWEL DECOMPRESSION  Patient Location: PACU and Endoscopy Unit  Anesthesia Type:MAC  Level of Consciousness: awake and patient cooperative  Airway & Oxygen Therapy: Patient Spontanous Breathing and Patient connected to face mask oxygen  Post-op Assessment: Report given to RN and Post -op Vital signs reviewed and stable  Post vital signs: Reviewed and stable  Last Vitals:  Vitals Value Taken Time  BP 155/68 01/04/2022 0705  Temp    Pulse 78 01/05/2022 0710  Resp 20 01/20/2022 0710  SpO2 100 % 01/20/2022 0710  Vitals shown include unvalidated device data.  Last Pain:  Vitals:   01/07/2022 0551  TempSrc: Temporal  PainSc: 7          Complications: No notable events documented.

## 2022-01-29 NOTE — Progress Notes (Signed)
°  Progress Note   Patient: Erin Carr LHT:[id number] DOB: 1930-09-02 DOA: 01/13/2022     0 DOS: the patient was seen and examined on 01/11/2022   Brief hospital course: 88 year old past medical history significant for diabetes type 2, COPD, heart failure preserved ejection fraction CKD stage III, hypertension, history of adenocarcinoma of right lung status post treatment, presenting for evaluation of worsening abdominal pain.  She has been constipated since last week.  She has a past medical history significant for exploratory laparotomy and additional lysis in 2006, status post hysterectomy.  CT abdomen and pelvis consistent with sigmoid volvulus. GI and surgery consulted.  Patient underwent flexible sigmoidoscopy with decompression of sigmoid volvulus.    Assessment and Plan: * Sigmoid volvulus ([place])- (present on admission) Patient presented with abdominal pain, constipation for more than 1 week, CT abdomen and pelvis consistent with sigmoid volvulus. -GI  consulted, underwent sigmoidoscopy for decompression, which  was successful. -Surgery following, to determine if patient will need Sx  Hyponatremia Setting of poor oral intake.  Continue with IV fluids. Component of  pseudohyponatremia due to hyperglycemia  CKD (chronic kidney disease) stage 3, GFR 30-59 ml/min (HCC) CKD stage IIIb Creatinine baseline 1.1--1.2 Monitor, continue with IV fluid  Hypertension- (present on admission) Resume metoprolol and Norvasc. Hold lisinopril and Lasix for now.  Type 2 diabetes mellitus without complication (HCC) Type 2 diabetes uncontrolled, with hyperglycemia. Hold metformin. Continue with sliding scale insulin.  If CBG continue to be elevated might need long-acting insulin         Subjective:  She report feeling better, denies abdominal pain.    Physical Exam: Vitals:   01/17/2022 0735 01/10/2022 0751 01/17/2022 0757 01/22/2022 1139  BP: (!) 146/65 (!) 151/67  (!) 159/72  Pulse: 81  82  82  Resp: 20 17  18   Temp:  98.3 F (36.8 C)  97.6 F (36.4 C)  TempSrc:    Oral  SpO2: 100% 100% 100% 98%  Weight:       Genera; NAD, Pleasant.  CVS; S 1, S2  RRR Abdomen; Soft, nt, no distended.   Data Reviewed:  Cbc, bmet CT scan   Family Communication: Na family at bedside.   Disposition: Status is: Inpatient Remains inpatient appropriate because: Remain inpatient for management of Volvulus.           Planned Discharge Destination: Home     Time spent: 45 minutes  Author: Elmarie Shiley, MD 01/27/2022 1:59 PM  For on call review [email or web address].

## 2022-01-29 NOTE — Assessment & Plan Note (Addendum)
Patient presented with abdominal pain, constipation for more than 1 week, CT abdomen and pelvis consistent with sigmoid volvulus. -GI  consulted, underwent sigmoidoscopy for decompression, which  was successful. -Surgery following, recommending lap assisted vs open and primary anastomosis vs colostomy.  Cardiology consulted for risk assessment. Patient was thought to be high risk for procedure.

## 2022-01-29 NOTE — Anesthesia Preprocedure Evaluation (Signed)
Anesthesia Evaluation  °Patient identified by MRN, date of birth, ID band °Patient awake ° ° ° °Reviewed: °Allergy & Precautions, NPO status , Patient's Chart, lab work & pertinent test results ° °Airway °Mallampati: II ° °TM Distance: >3 FB °Neck ROM: Full ° ° ° Dental ° °(+) Upper Dentures, Lower Dentures °  °Pulmonary °with exertion, asthma , COPD, former smoker,  °  °Pulmonary exam normal ° ° ° ° ° ° ° Cardiovascular °hypertension, Pt. on medications and Pt. on home beta blockers °+ Past MI and +CHF  ° °Rhythm:Regular Rate:Normal ° ° °  °Neuro/Psych °negative neurological ROS ° negative psych ROS  ° GI/Hepatic °Neg liver ROS, volvulus °  °Endo/Other  °diabetes, Poorly Controlled, Type 2, Oral Hypoglycemic Agents ° Renal/GU °Renal disease  °negative genitourinary °  °Musculoskeletal ° °(+) Arthritis , Osteoarthritis,   ° Abdominal °Normal abdominal exam  (+)   °Peds ° Hematology ° °(+) Blood dyscrasia, anemia ,   °Anesthesia Other Findings ° ° Reproductive/Obstetrics ° °  ° ° ° ° ° ° ° ° ° ° ° ° ° °  °  ° ° ° ° ° ° ° ° °Anesthesia Physical °Anesthesia Plan ° °ASA: 3 and emergent ° °Anesthesia Plan: MAC  ° °Post-op Pain Management:   ° °Induction: Intravenous ° °PONV Risk Score and Plan: 2 and Propofol infusion and Treatment may vary due to age or medical condition ° °Airway Management Planned: Simple Face Mask, Natural Airway and Nasal Cannula ° °Additional Equipment: None ° °Intra-op Plan:  ° °Post-operative Plan:  ° °Informed Consent: I have reviewed the patients History and Physical, chart, labs and discussed the procedure including the risks, benefits and alternatives for the proposed anesthesia with the patient or authorized representative who has indicated his/her understanding and acceptance.  ° ° ° °Dental advisory given ° °Plan Discussed with: CRNA ° °Anesthesia Plan Comments: (Lab Results °     Component                Value               Date                 °     WBC                       11.0 (H)            01/23/2022           °     HGB                      12.3                01/27/2022           °     HCT                      40.9                01/21/2022           °     MCV                      86.8                01/27/2022           °     PLT                        249                 01/22/2022           °Lab Results °     Component                Value               Date                 °     NA                       130 (L)             01/17/2022           °     K                        4.4                 01/06/2022           °     CO2                      24                  01/29/2022           °     GLUCOSE                  344 (H)             01/24/2022           °     BUN                      18                  01/17/2022           °     CREATININE               1.18 (H)            01/04/2022           °     CALCIUM                  9.8                 01/27/2022           °     EGFR                     46 (L)              09/16/2021           °     GFRNONAA                 44 (L)              01/22/2022          )  ° ° ° ° ° ° °Anesthesia Quick Evaluation ° °

## 2022-01-29 NOTE — Assessment & Plan Note (Addendum)
Type 2 diabetes uncontrolled, with hyperglycemia. Hold metformin. Continue with sliding scale insulin.  If CBG continue to be elevated might need long-acting insulin

## 2022-01-29 NOTE — H&P (Addendum)
History and Physical    Patient: Erin Carr KGM:[id number] DOB: Sep 17, 1930 DOA: 01/23/2022 DOS: the patient was seen and examined on 01/05/2022 PCP: Sandrea Hughs, NP  Patient coming from: Home  Chief Complaint:  Chief Complaint  Patient presents with   Abdominal Pain   Constipation    HPI: Erin Carr is a 88 y.o. female with medical history significant of DMT2, COPD, HFpEF, CKD 3, HTN, hx of adenocarcinoma of right lung s/p therapy, HLD who presents for evaluation of the patient and worsening abdominal pain.  She reports she has been constipated since last week and has not had a bowel movement in the last 6 to 7 days.  She reports yesterday she began to have abdominal pain that is progressively gotten worse.  She has not passed any flatus since yesterday.  She reports she has had some nausea at home but did not vomit until she got to the emergency room when she had 1 episode of vomiting.  She has a history of bowel obstruction due to adhesion in the past and underwent exploratory laparotomy and adhesion lysis in 2006.  She also has history of hysterectomy.  She denies having any fever, chills, shortness of breath, chest pain, syncope, urinary symptoms.  She lives alone and ambulates with a walker.  Niece lives next door to her and helps her with her ADLs. Denies tobacco alcohol or illicit drug use.   She has been hemodynamically stable in the emergency room and afebrile.  She was found to have sigmoid volvulus on abdominal x-ray and CT of the abdomen pelvis.  GI and general surgery been consulted.   Review of Systems: As mentioned in the history of present illness. All other systems reviewed and are negative. Past Medical History:  Diagnosis Date   Adenocarcinoma of right lung, stage 1 ([place]) 11/16/2016   Arthritis    Asthma    COPD (chronic obstructive pulmonary disease) (HCC)    Diabetes mellitus without complication ([place])    Type II   Family history of adverse reaction to  anesthesia    "aunt had PONV"   High cholesterol    History of radiation therapy 12/19/16-12/27/16   SBRT to right upper lobe 54 Gy in 3 fractions   Hypercholesteremia    Hypertension    Osteoporosis    Positive PPD 11/2015   PER PCP   Shortness of breath dyspnea    Tremors of nervous system    Left hand   Urinary frequency    Vitamin D deficiency    Wears glasses    Past Surgical History:  Procedure Laterality Date   ABDOMINAL HYSTERECTOMY     ANTERIOR AND POSTERIOR REPAIR N/A 12/16/2014   Procedure: ANTERIOR (CYSTOCELE) AND POSTERIOR REPAIR (RECTOCELE);  Surgeon: Crawford Givens, MD;  Location: [facility];  Service: Gynecology;  Laterality: N/A;   APPENDECTOMY     BOWEL RESECTION     BREAST BIOPSY Left    COLONOSCOPY     VIDEO BRONCHOSCOPY WITH ENDOBRONCHIAL ULTRASOUND N/A 12/17/2015   Procedure: VIDEO BRONCHOSCOPY WITH ENDOBRONCHIAL ULTRASOUND;  Surgeon: Ivin Poot, MD;  Location: [facility];  Service: Thoracic;  Laterality: N/A;   Social History:  reports that she quit smoking about 20 years ago. Her smoking use included cigarettes. She has a 20.00 pack-year smoking history. She has never used smokeless tobacco. She reports that she does not drink alcohol and does not use drugs.  No Known Allergies  Family History  Problem Relation Age of  Onset   Hypertension Father    Diabetes Father     Prior to Admission medications   Medication Sig Start Date End Date Taking? Authorizing Provider  amLODipine (NORVASC) 10 MG tablet Take 1 tablet (10 mg total) by mouth daily. 09/12/21   Ngetich, Dinah C, NP  aspirin EC 81 MG tablet Take 81 mg by mouth daily.    [provider]  atorvastatin (LIPITOR) 40 MG tablet Take 1 tablet (40 mg total) by mouth daily at 6 PM. 09/12/21 12/11/21  Ngetich, Dinah C, NP  brimonidine (ALPHAGAN) 0.2 % ophthalmic solution Place 1 drop into the left eye 2 (two) times daily.    [provider]  docusate sodium (COLACE) 100 MG capsule Take 100 mg by  mouth at bedtime.    [provider]  furosemide (LASIX) 20 MG tablet TAKE 3 TABLETS BY MOUTH EVERY MORNING AND EVERY EVENING 01/04/22   Ngetich, Dinah C, NP  gabapentin (NEURONTIN) 100 MG capsule Take 2 capsules (200 mg total) by mouth 2 (two) times daily. 06/10/21   Patrecia Pour, MD  latanoprost (XALATAN) 0.005 % ophthalmic solution Place 1 drop into the left eye at bedtime. 10/16/20   [provider]  lisinopril (PRINIVIL,ZESTRIL) 20 MG tablet Take 20 mg by mouth 2 (two) times daily.    [provider]  metFORMIN (GLUCOPHAGE) 500 MG tablet Take 1 tablet (500 mg total) by mouth 2 (two) times daily with a meal. 12/01/21 03/01/22  Ngetich, Dinah C, NP  metoprolol tartrate (LOPRESSOR) 25 MG tablet Take 1 tablet (25 mg total) by mouth 2 (two) times daily. 01/04/22   Ngetich, Dinah C, NP  Netarsudil Dimesylate (RHOPRESSA) 0.02 % SOLN Place 1 drop into the left eye daily.    [provider]  polyethylene glycol powder (GLYCOLAX/MIRALAX) 17 GM/SCOOP powder Take 17 g by mouth daily. Hold for loose stool 09/12/21   Ngetich, Dinah C, NP  potassium chloride (KLOR-CON) 10 MEQ tablet Take 1 tablet (10 mEq total) by mouth daily. 09/12/21   Ngetich, Nelda Bucks, NP    Physical Exam: Vitals:   01/18/2022 0315 01/06/2022 0345 01/13/2022 0415 01/16/2022 0445  BP: (!) 177/77 (!) 160/123 (!) 167/78 (!) 171/81  Pulse: 81 88 86 84  Resp: 19 16 13 16   Temp:      TempSrc:      SpO2: 97% 100% 97% 97%   General: WDWN, Alert and oriented x3.  Eyes: EOMI, PERRL, conjunctivae normal.  Sclera nonicteric HENT:  [place]/AT, external ears normal.  Nares patent without epistasis.  Mucous membranes are dry. Posterior pharynx clear of any exudate  Neck: Soft, normal range of motion, supple, no masses,Trachea midline Respiratory: clear to auscultation bilaterally, no wheezing, no crackles. Normal respiratory effort.  Cardiovascular: Regular rate and rhythm, no murmurs / rubs / gallops. No extremity edema. 2+  pedal pulses Abdomen: Soft, generalized tenderness to palpation, nondistended, no rebound or guarding.  No masses palpated. Bowel sounds hypoactive Musculoskeletal: FROM. no cyanosis.Normal muscle tone.  Skin: Warm, dry, intact no rashes, lesions, ulcers. No induration Neurologic: CN 2-12 grossly intact.  Normal speech.  Sensation intact Psychiatric: Normal mood.    Data Reviewed: Lab Work:   WBC 11,000 hemoglobin 12.3 hematocrit 40.9 platelets 249,000   sodium 130 potassium 4.4 chloride 97 bicarb 24 creatinine 1.18 BUN 18 glucose 344 calcium 9.8 magnesium 2.0 alkaline phosphatase 92 AST 21 ALT 14 BUN 4.2 lipase 93 COVID-negative  Influenza A and B negative  Abdominal x-ray-gaseous distention of the  sigmoid colon with large volume of stool throughout the colon. CT abdomen pelvis with contrast-twisting of the sigmoid colon with dilated sigmoid colon extending to the upper abdomen compatible with sigmoid volvulus.  Large amount of stool in the colon proximal to and within the sigmoid volvulus; small to moderate hiatal hernia.  EKG shows normal sinus rhythm with no acute ST elevation or depression.  QTc 438  Assessment and Plan: * Sigmoid volvulus ([place])- (present on admission) Ms. Demby is admitted to med surg floor.  Enema is ordered by the ER physician  GI consulted and will see pt and take for decompression this am.  Surgery is consulted and will follow  Pain control provided.  IVF hydration with LR  Type 2 diabetes mellitus without complication (HCC) Monitor Blood sugar and provide SSI for glycemic control.  Check HgbA1c.  Hold oral medication while NPO  CKD (chronic kidney disease) stage 3, GFR 30-59 ml/min (HCC) IVF hydration with LR at 80 ml/hr Check electrolytes and renal function in am  Hypertension- (present on admission) Given metoprolol IV now for elevated BP.  Monitor BP.  Resume oral antihypertensive once diet resumed  Hyponatremia IVF hydration. Monitor  electrolytes  Advance Care Planning:   Code Status:  Full code   Consults: Gastroenterology and surgery consulted by ER physician and will see patient  Family Communication: Diagnosis and plan discussed with patient.  Patient verbalized understanding agrees with plan.  Further recommendations to follow as clinically indicated  Author: Eben Burow, MD 01/31/2022 5:20 AM  For on call review [email or web address].

## 2022-01-29 NOTE — Consult Note (Signed)
Consultation  Referring Provider:    ED  Primary Care Physician:  Ngetich, Nelda Bucks, NP Primary Gastroenterologist:    none     Reason for Consultation:     Sigmoid volvulus         HPI:   Erin Carr is a 88 y.o. female  With H/O COPD, DM2, HLD, HTN, osteoporosis, CHF but with normal EF, H/O SBO s/p x-lap and LOA 2006 Lives at home by herself (niece lives next door) With chronic constipation on MiraLAX Had progressive abdominal distention and constipation. She did not have any bowel movements in the last 7 to 8 days Came into ED CT scan Abdo/pelvis With sigmoid volvulus GI been consulted for decompression.  No fever chills or night sweats.  No melena or hematochezia.  Past Medical History:  Diagnosis Date   Adenocarcinoma of right lung, stage 1 ([place]) 11/16/2016   Arthritis    Asthma    COPD (chronic obstructive pulmonary disease) (HCC)    Diabetes mellitus without complication ([place])    Type II   Family history of adverse reaction to anesthesia    "aunt had PONV"   High cholesterol    History of radiation therapy 12/19/16-12/27/16   SBRT to right upper lobe 54 Gy in 3 fractions   Hypercholesteremia    Hypertension    Osteoporosis    Positive PPD 11/2015   PER PCP   Shortness of breath dyspnea    Tremors of nervous system    Left hand   Urinary frequency    Vitamin D deficiency    Wears glasses     Past Surgical History:  Procedure Laterality Date   ABDOMINAL HYSTERECTOMY     ANTERIOR AND POSTERIOR REPAIR N/A 12/16/2014   Procedure: ANTERIOR (CYSTOCELE) AND POSTERIOR REPAIR (RECTOCELE);  Surgeon: Crawford Givens, MD;  Location: [facility];  Service: Gynecology;  Laterality: N/A;   APPENDECTOMY     BOWEL RESECTION     BREAST BIOPSY Left    COLONOSCOPY     VIDEO BRONCHOSCOPY WITH ENDOBRONCHIAL ULTRASOUND N/A 12/17/2015   Procedure: VIDEO BRONCHOSCOPY WITH ENDOBRONCHIAL ULTRASOUND;  Surgeon: Ivin Poot, MD;  Location: [facility];  Service: Thoracic;  Laterality:  N/A;    Family History  Problem Relation Age of Onset   Hypertension Father    Diabetes Father      Social History   Tobacco Use   Smoking status: Former    Packs/day: 0.50    Years: 40.00    Pack years: 20.00    Types: Cigarettes    Quit date: 12/08/2001    Years since quitting: 20.1   Smokeless tobacco: Never   Tobacco comments:    quit about 15-16 years ago; 12/09/2015  Substance Use Topics   Alcohol use: No   Drug use: No    Prior to Admission medications   Medication Sig Start Date End Date Taking? Authorizing Provider  amLODipine (NORVASC) 10 MG tablet Take 1 tablet (10 mg total) by mouth daily. 09/12/21   Ngetich, Dinah C, NP  aspirin EC 81 MG tablet Take 81 mg by mouth daily.    [provider]  atorvastatin (LIPITOR) 40 MG tablet Take 1 tablet (40 mg total) by mouth daily at 6 PM. 09/12/21 12/11/21  Ngetich, Dinah C, NP  brimonidine (ALPHAGAN) 0.2 % ophthalmic solution Place 1 drop into the left eye 2 (two) times daily.    [provider]  docusate sodium (COLACE) 100 MG capsule Take 100  mg by mouth at bedtime.    [provider]  furosemide (LASIX) 20 MG tablet TAKE 3 TABLETS BY MOUTH EVERY MORNING AND EVERY EVENING 01/04/22   Ngetich, Dinah C, NP  gabapentin (NEURONTIN) 100 MG capsule Take 2 capsules (200 mg total) by mouth 2 (two) times daily. 06/10/21   Patrecia Pour, MD  latanoprost (XALATAN) 0.005 % ophthalmic solution Place 1 drop into the left eye at bedtime. 10/16/20   [provider]  lisinopril (PRINIVIL,ZESTRIL) 20 MG tablet Take 20 mg by mouth 2 (two) times daily.    [provider]  metFORMIN (GLUCOPHAGE) 500 MG tablet Take 1 tablet (500 mg total) by mouth 2 (two) times daily with a meal. 12/01/21 03/01/22  Ngetich, Dinah C, NP  metoprolol tartrate (LOPRESSOR) 25 MG tablet Take 1 tablet (25 mg total) by mouth 2 (two) times daily. 01/04/22   Ngetich, Dinah C, NP  Netarsudil Dimesylate (RHOPRESSA) 0.02 % SOLN Place 1 drop  into the left eye daily.    [provider]  polyethylene glycol powder (GLYCOLAX/MIRALAX) 17 GM/SCOOP powder Take 17 g by mouth daily. Hold for loose stool 09/12/21   Ngetich, Dinah C, NP  potassium chloride (KLOR-CON) 10 MEQ tablet Take 1 tablet (10 mEq total) by mouth daily. 09/12/21   Ngetich, Nelda Bucks, NP    Current Facility-Administered Medications  Medication Dose Route Frequency Provider Last Rate Last Admin   0.9 %  sodium chloride infusion   Intravenous Continuous Jackquline Denmark, MD       lactated ringers infusion    Continuous PRN Jackquline Denmark, MD 10 mL/hr at 01/11/2022 0553 10 mL/hr at 01/04/2022 0553   [MAR Hold] metoprolol tartrate (LOPRESSOR) injection 5 mg  5 mg Intravenous Once Chotiner, Yevonne Aline, MD        Allergies as of 01/06/2022   (No Known Allergies)     Review of Systems:    As per HPI, otherwise negative    Physical Exam:  Vital signs in last 24 hours: Temp:  [98.5 F (36.9 C)] 98.5 F (36.9 C) (02/25 2304) Pulse Rate:  [71-88] 88 (02/26 0551) Resp:  [13-23] 20 (02/26 0551) BP: (160-206)/(73-123) 206/93 (02/26 0551) SpO2:  [97 %-100 %] 98 % (02/26 0551)   General:   Pleasant female in NAD Head:  Normocephalic and atraumatic. Eyes:   No icterus.   Conjunctiva pink. Ears:  Normal auditory acuity. Neck:  Supple Lungs: Respirations even and unlabored. Lungs clear to auscultation bilaterally.   No wheezes, crackles, or rhonchi.  Heart:  Regular rate and rhythm; no MRG Abdomen: Distended, tympanic.  Minimal bowel sounds.   Rectal:  Not performed.  Msk:  Symmetrical without gross deformities.  Extremities:  Without edema. Neurologic:  Alert and  oriented x4;  grossly normal neurologically. Skin:  Intact without significant lesions or rashes. Psych:  Alert and cooperative. Normal affect.  LAB RESULTS: Recent Labs    01/25/2022 2325  WBC 11.0*  HGB 12.3  HCT 40.9  PLT 249   BMET Recent Labs    01/14/2022 2325  NA 130*  K 4.4  CL 97*  CO2  24  GLUCOSE 344*  BUN 18  CREATININE 1.18*  CALCIUM 9.8   LFT Recent Labs    01/15/2022 2325  PROT 7.9  ALBUMIN 4.2  AST 21  ALT 14  ALKPHOS 92  BILITOT 0.4   PT/INR No results for input(s): LABPROT, INR in the last 72 hours.  STUDIES: CT Abdomen Pelvis W Contrast  Result Date: 01/27/2022 CLINICAL DATA:  Nausea, vomiting, abdominal pain EXAM: CT ABDOMEN AND PELVIS WITH CONTRAST TECHNIQUE: Multidetector CT imaging of the abdomen and pelvis was performed using the standard protocol following bolus administration of intravenous contrast. RADIATION DOSE REDUCTION: This exam was performed according to the departmental dose-optimization program which includes automated exposure control, adjustment of the mA and/or kV according to patient size and/or use of iterative reconstruction technique. CONTRAST:  63mL OMNIPAQUE IOHEXOL 300 MG/ML  SOLN COMPARISON:  02/16/2010 FINDINGS: Lower chest: Small to moderate-sized hiatal hernia, enlarged. Coronary artery and aortic calcifications. No acute abnormality. Hepatobiliary: No focal hepatic abnormality. Gallbladder unremarkable. Pancreas: No focal abnormality or ductal dilatation. Spleen: No focal abnormality.  Normal size. Adrenals/Urinary Tract: Bilateral renal cysts. No hydronephrosis. X renal pelves bilaterally. Urinary bladder and adrenal glands unremarkable. Stomach/Bowel: Gaseous distention of the sigmoid colon. Inferiorly in the pelvis, the sigmoid colon twists compatible with sigmoid volvulus. Large stool burden in the sigmoid colon and proximal to the sigmoid volvulus. Distally, the rectum is decompressed. Small bowel decompressed. Vascular/Lymphatic: Aortic atherosclerosis. No evidence of aneurysm or adenopathy. Reproductive: Prior hysterectomy.  No adnexal masses. Other: No free fluid or free air. Musculoskeletal: No acute bony abnormality. Degenerative changes in the lumbar spine. IMPRESSION: Twisting of the sigmoid colon with dilated sigmoid colon  extending into the upper abdomen compatible with sigmoid volvulus. Large amount of stool in the colon within the sigmoid volvulus and proximal to the volvulus. Small to moderate hiatal hernia. Bilateral renal cysts. Aortic atherosclerosis. These results were called by telephone at the time of interpretation on 01/16/2022 at 2:38 am to provider [facility] , who verbally acknowledged these results. Electronically Signed   By: Rolm Baptise M.D.   On: 01/22/2022 02:44   DG Abd 2 Views  Result Date: 01/11/2022 CLINICAL DATA:  Constipation EXAM: ABDOMEN - 2 VIEW COMPARISON:  08/29/2005 FINDINGS: Large stool burden throughout the colon. Gaseous distention of the sigmoid colon which extends into the upper abdomen. No free air or organomegaly. Visualized lung bases clear. IMPRESSION: Gaseous distention of the sigmoid colon. Cannot exclude sigmoid volvulus. Large stool burden throughout the colon. Abdominal CT may be helpful for further evaluation. Electronically Signed   By: Rolm Baptise M.D.   On: 01/18/2022 23:40        Impression / Plan:   Sigmoid volvulus. CT reviewed. No S/S of ischemic colitis Chronic constipation  Plan: -Unprepped colonoscopy/sig urgent decompression -Intensive lactulose therapy thereafter -Appreciate surgical consultation.  -If continued constipation, change amlodipine to some other antihypertensive meds.  Discussed risks and benefits including higher risks of recurrence.   Carmell Austria, MD [facility]    LOS: 0 days   Jackquline Denmark  01/26/2022, 5:57 AM

## 2022-01-29 NOTE — Consult Note (Signed)
CODI FOLKERTS [birth year]  [id number].    Requesting MD: Dr. Ralene Bathe Chief Complaint/Reason for Consult: sigmoid volvulus  HPI:  [name] is an 88 yo female who presented to the ED with worsening abdominal pain and constipation. She has been constipated for the last week and has not had a bowel movement in 8 days. Last night she began having abdominal pain, and has not passed flatus since yesterday. She has been hemodynamically stable since arrival to the ED. She has had one episode of vomiting in the ED but no emesis prior to that. An abdominal XR showed a dilated loop of sigmoid colon, and a CT scan confirmed sigmoid volvulus.  The patient has previously had a hysterectomy and in 2006 underwent an ex lap and adhesiolysis by Dr. Margot Chimes for a small bowel obstruction. She lives alone and ambulates with the assistance of a walker. Her niece, who is here with her, lives next door and helps her daily with many of her ADLs. She has a history of chronic diastolic CHF documented, last echo in 2018 showed an EF of 55-60%.  ROS: Review of Systems  Constitutional:  Negative for chills and fever.  Eyes:  Negative for blurred vision.  Respiratory:  Negative for shortness of breath and stridor.   Cardiovascular:  Negative for chest pain.  Gastrointestinal:  Positive for abdominal pain, constipation, nausea and vomiting.  Musculoskeletal:  Negative for falls.  Neurological:  Negative for dizziness and speech change.   Family History  Problem Relation Age of Onset   Hypertension Father    Diabetes Father     Past Medical History:  Diagnosis Date   Adenocarcinoma of right lung, stage 1 ([place]) 11/16/2016   Arthritis    Asthma    COPD (chronic obstructive pulmonary disease) ([place])    Diabetes mellitus without complication ([place])    Type II   Family history of adverse reaction to anesthesia    "aunt had PONV"   High cholesterol    History of radiation therapy 12/19/16-12/27/16   SBRT to right  upper lobe 54 Gy in 3 fractions   Hypercholesteremia    Hypertension    Osteoporosis    Positive PPD 11/2015   PER PCP   Shortness of breath dyspnea    Tremors of nervous system    Left hand   Urinary frequency    Vitamin D deficiency    Wears glasses     Past Surgical History:  Procedure Laterality Date   ABDOMINAL HYSTERECTOMY     ANTERIOR AND POSTERIOR REPAIR N/A 12/16/2014   Procedure: ANTERIOR (CYSTOCELE) AND POSTERIOR REPAIR (RECTOCELE);  Surgeon: Crawford Givens, MD;  Location: [facility];  Service: Gynecology;  Laterality: N/A;   APPENDECTOMY     BOWEL RESECTION     BREAST BIOPSY Left    COLONOSCOPY     VIDEO BRONCHOSCOPY WITH ENDOBRONCHIAL ULTRASOUND N/A 12/17/2015   Procedure: VIDEO BRONCHOSCOPY WITH ENDOBRONCHIAL ULTRASOUND;  Surgeon: Ivin Poot, MD;  Location: [facility];  Service: Thoracic;  Laterality: N/A;    Social History:  reports that she quit smoking about 20 years ago. Her smoking use included cigarettes. She has a 20.00 pack-year smoking history. She has never used smokeless tobacco. She reports that she does not drink alcohol and does not use drugs.  Allergies: No Known Allergies  (Not in a hospital admission)    Physical Exam: Blood pressure (!) 160/123, pulse 88, temperature 98.5 F (36.9 C), temperature source Oral, resp. rate 16, SpO2 100 %.  General: resting comfortably, appears stated age, no apparent distress Neurological: alert and oriented, no focal deficits, cranial nerves grossly in tact HEENT: normocephalic, atraumatic, no scleral icterus CV: regular rate and rhythm, extremities warm and well-perfused Respiratory: normal work of breathing on room air, symmetric chest wall expansion Abdomen: soft, nondistended, minimally tender to palpation in the lower abdomen, no rebound tenderness or guarding. No masses or organomegaly. Extremities: warm and well-perfused, no deformities, moving all extremities spontaneously Psychiatric: normal mood and  affect Skin: warm and dry, no jaundice, no rashes or lesions   Results for orders placed or performed during the hospital encounter of 01/20/2022 (from the past 48 hour(s))  Comprehensive metabolic panel     Status: Abnormal   Collection Time: 01/06/2022 11:25 PM  Result Value Ref Range   Sodium 130 (L) 135 - 145 mmol/L   Potassium 4.4 3.5 - 5.1 mmol/L   Chloride 97 (L) 98 - 111 mmol/L   CO2 24 22 - 32 mmol/L   Glucose, Bld 344 (H) 70 - 99 mg/dL    Comment: Glucose reference range applies only to samples taken after fasting for at least 8 hours.   BUN 18 8 - 23 mg/dL   Creatinine, Ser 1.18 (H) 0.44 - 1.00 mg/dL   Calcium 9.8 8.9 - 10.3 mg/dL   Total Protein 7.9 6.5 - 8.1 g/dL   Albumin 4.2 3.5 - 5.0 g/dL   AST 21 15 - 41 U/L   ALT 14 0 - 44 U/L   Alkaline Phosphatase 92 38 - 126 U/L   Total Bilirubin 0.4 0.3 - 1.2 mg/dL   GFR, Estimated 44 (L) >60 mL/min    Comment: (NOTE) Calculated using the CKD-EPI Creatinine Equation (2021)    Anion gap 9 5 - 15    Comment: Performed at [facility] [address], [place], [place] [address]  CBC with Differential     Status: Abnormal   Collection Time: 01/15/2022 11:25 PM  Result Value Ref Range   WBC 11.0 (H) 4.0 - 10.5 K/uL   RBC 4.71 3.87 - 5.11 MIL/uL   Hemoglobin 12.3 12.0 - 15.0 g/dL   HCT 40.9 36.0 - 46.0 %   MCV 86.8 80.0 - 100.0 fL   MCH 26.1 26.0 - 34.0 pg   MCHC 30.1 30.0 - 36.0 g/dL   RDW 14.8 11.5 - 15.5 %   Platelets 249 150 - 400 K/uL   nRBC 0.0 0.0 - 0.2 %   Neutrophils Relative % 81 %   Neutro Abs 9.0 (H) 1.7 - 7.7 K/uL   Lymphocytes Relative 10 %   Lymphs Abs 1.1 0.7 - 4.0 K/uL   Monocytes Relative 6 %   Monocytes Absolute 0.6 0.1 - 1.0 K/uL   Eosinophils Relative 2 %   Eosinophils Absolute 0.2 0.0 - 0.5 K/uL   Basophils Relative 0 %   Basophils Absolute 0.0 0.0 - 0.1 K/uL   Immature Granulocytes 1 %   Abs Immature Granulocytes 0.06 0.00 - 0.07 K/uL    Comment: Performed at [facility]  [address] [place], [place] [address]  Lipase, blood     Status: Abnormal   Collection Time: 01/06/2022 11:25 PM  Result Value Ref Range   Lipase 93 (H) 11 - 51 U/L    Comment: Performed at [facility] [address] [place], [place] [address]  Magnesium     Status: None   Collection Time: 01/15/2022 11:25 PM  Result Value  Ref Range   Magnesium 2.0 1.7 - 2.4 mg/dL    Comment: Performed at [facility] [address] [place], [place] [address]  Resp Panel by RT-PCR (Flu A&B, Covid) Nasopharyngeal Swab     Status: None   Collection Time: 01/14/2022  2:47 AM   Specimen: Nasopharyngeal Swab; Nasopharyngeal(NP) swabs in vial transport medium  Result Value Ref Range   SARS Coronavirus 2 by RT PCR NEGATIVE NEGATIVE    Comment: (NOTE) SARS-CoV-2 target nucleic acids are NOT DETECTED.  The SARS-CoV-2 RNA is generally detectable in upper respiratory specimens during the acute phase of infection. The lowest concentration of SARS-CoV-2 viral copies this assay can detect is 138 copies/mL. A negative result does not preclude SARS-Cov-2 infection and should not be used as the sole basis for treatment or other patient management decisions. A negative result may occur with  improper specimen collection/handling, submission of specimen other than nasopharyngeal swab, presence of viral mutation(s) within the areas targeted by this assay, and inadequate number of viral copies(<138 copies/mL). A negative result must be combined with clinical observations, patient history, and epidemiological information. The expected result is Negative.  Fact Sheet for Patients:  EntrepreneurPulse.com.au  Fact Sheet for Healthcare Providers:  IncredibleEmployment.be  This test is no t yet approved or cleared by the [place] FDA and  has been authorized for detection and/or diagnosis of SARS-CoV-2 by FDA under an Emergency Use Authorization (EUA). This EUA will remain  in  effect (meaning this test can be used) for the duration of the COVID-19 declaration under Section 564(b)(1) of the Act, 21 U.S.C.section 360bbb-3(b)(1), unless the authorization is terminated  or revoked sooner.       Influenza A by PCR NEGATIVE NEGATIVE   Influenza B by PCR NEGATIVE NEGATIVE    Comment: (NOTE) The Xpert Xpress SARS-CoV-2/FLU/RSV plus assay is intended as an aid in the diagnosis of influenza from Nasopharyngeal swab specimens and should not be used as a sole basis for treatment. Nasal washings and aspirates are unacceptable for Xpert Xpress SARS-CoV-2/FLU/RSV testing.  Fact Sheet for Patients: EntrepreneurPulse.com.au  Fact Sheet for Healthcare Providers: IncredibleEmployment.be  This test is not yet approved or cleared by the [place] FDA and has been authorized for detection and/or diagnosis of SARS-CoV-2 by FDA under an Emergency Use Authorization (EUA). This EUA will remain in effect (meaning this test can be used) for the duration of the COVID-19 declaration under Section 564(b)(1) of the Act, 21 U.S.C. section 360bbb-3(b)(1), unless the authorization is terminated or revoked.  Performed at [facility], [place] [address], [place], [place] [address]    CT Abdomen Pelvis W Contrast  Result Date: 01/19/2022 CLINICAL DATA:  Nausea, vomiting, abdominal pain EXAM: CT ABDOMEN AND PELVIS WITH CONTRAST TECHNIQUE: Multidetector CT imaging of the abdomen and pelvis was performed using the standard protocol following bolus administration of intravenous contrast. RADIATION DOSE REDUCTION: This exam was performed according to the departmental dose-optimization program which includes automated exposure control, adjustment of the mA and/or kV according to patient size and/or use of iterative reconstruction technique. CONTRAST:  62mL OMNIPAQUE IOHEXOL 300 MG/ML  SOLN COMPARISON:  02/16/2010 FINDINGS: Lower chest: Small to  moderate-sized hiatal hernia, enlarged. Coronary artery and aortic calcifications. No acute abnormality. Hepatobiliary: No focal hepatic abnormality. Gallbladder unremarkable. Pancreas: No focal abnormality or ductal dilatation. Spleen: No focal abnormality.  Normal size. Adrenals/Urinary Tract: Bilateral renal cysts. No hydronephrosis. X renal pelves bilaterally. Urinary bladder and adrenal glands unremarkable. Stomach/Bowel: Gaseous distention  of the sigmoid colon. Inferiorly in the pelvis, the sigmoid colon twists compatible with sigmoid volvulus. Large stool burden in the sigmoid colon and proximal to the sigmoid volvulus. Distally, the rectum is decompressed. Small bowel decompressed. Vascular/Lymphatic: Aortic atherosclerosis. No evidence of aneurysm or adenopathy. Reproductive: Prior hysterectomy.  No adnexal masses. Other: No free fluid or free air. Musculoskeletal: No acute bony abnormality. Degenerative changes in the lumbar spine. IMPRESSION: Twisting of the sigmoid colon with dilated sigmoid colon extending into the upper abdomen compatible with sigmoid volvulus. Large amount of stool in the colon within the sigmoid volvulus and proximal to the volvulus. Small to moderate hiatal hernia. Bilateral renal cysts. Aortic atherosclerosis. These results were called by telephone at the time of interpretation on 01/27/2022 at 2:38 am to provider [facility] , who verbally acknowledged these results. Electronically Signed   By: Rolm Baptise M.D.   On: 01/20/2022 02:44   DG Abd 2 Views  Result Date: 01/12/2022 CLINICAL DATA:  Constipation EXAM: ABDOMEN - 2 VIEW COMPARISON:  08/29/2005 FINDINGS: Large stool burden throughout the colon. Gaseous distention of the sigmoid colon which extends into the upper abdomen. No free air or organomegaly. Visualized lung bases clear. IMPRESSION: Gaseous distention of the sigmoid colon. Cannot exclude sigmoid volvulus. Large stool burden throughout the colon. Abdominal CT may  be helpful for further evaluation. Electronically Signed   By: Rolm Baptise M.D.   On: 01/25/2022 23:40      Assessment/Plan This is an 88 yo female presenting with obstipation and abdominal pain secondary to sigmoid volvulus. I personally reviewed her imaging, which confirms volvulus of the sigmoid colon. There is a large stool burden in the colon but no distension of the cecum to suggest impending perforation. The small bowel is decompressed. There are no peritoneal signs on abdominal exam. GI has been consulted and plans to proceed with colonoscopic decompression this morning. I discussed with the patient and her niece that she will be at high risk for recurrence even after successful decompression, so sigmoid colectomy is typically recommended to definitively treat sigmoid volvulus, ideally after a bowel prep has been completed. I also discussed that if endoscopic decompression is not successful, she will require emergent surgery, with the possibility of a colostomy. The patient expressed understanding and is agreeable to this.  The patient is being admitted to a medical service. Surgery will follow while admitted.  Michaelle Birks, [facility], Hepatobiliary and Pancreatic Surgery 01/06/2022 4:38 AM

## 2022-01-29 NOTE — Assessment & Plan Note (Addendum)
CKD stage IIIb Creatinine baseline 1.1--1.2 Monitor, Treated  with IV fluid

## 2022-01-30 ENCOUNTER — Inpatient Hospital Stay (HOSPITAL_COMMUNITY): Payer: Medicare Other

## 2022-01-30 ENCOUNTER — Encounter (HOSPITAL_COMMUNITY): Payer: Self-pay | Admitting: Gastroenterology

## 2022-01-30 DIAGNOSIS — J9601 Acute respiratory failure with hypoxia: Secondary | ICD-10-CM

## 2022-01-30 DIAGNOSIS — K562 Volvulus: Secondary | ICD-10-CM | POA: Diagnosis not present

## 2022-01-30 DIAGNOSIS — L8990 Pressure ulcer of unspecified site, unspecified stage: Secondary | ICD-10-CM | POA: Insufficient documentation

## 2022-01-30 DIAGNOSIS — J690 Pneumonitis due to inhalation of food and vomit: Secondary | ICD-10-CM

## 2022-01-30 LAB — CBC
HCT: 42.1 % (ref 36.0–46.0)
Hemoglobin: 13.4 g/dL (ref 12.0–15.0)
MCH: 26.7 pg (ref 26.0–34.0)
MCHC: 31.8 g/dL (ref 30.0–36.0)
MCV: 84 fL (ref 80.0–100.0)
Platelets: 253 10*3/uL (ref 150–400)
RBC: 5.01 MIL/uL (ref 3.87–5.11)
RDW: 15 % (ref 11.5–15.5)
WBC: 15.3 10*3/uL — ABNORMAL HIGH (ref 4.0–10.5)
nRBC: 0 % (ref 0.0–0.2)

## 2022-01-30 LAB — BASIC METABOLIC PANEL
Anion gap: 10 (ref 5–15)
BUN: 22 mg/dL (ref 8–23)
CO2: 24 mmol/L (ref 22–32)
Calcium: 9.2 mg/dL (ref 8.9–10.3)
Chloride: 102 mmol/L (ref 98–111)
Creatinine, Ser: 1.2 mg/dL — ABNORMAL HIGH (ref 0.44–1.00)
GFR, Estimated: 43 mL/min — ABNORMAL LOW (ref >60)
Glucose, Bld: 345 mg/dL — ABNORMAL HIGH (ref 70–99)
Potassium: 4 mmol/L (ref 3.5–5.1)
Sodium: 136 mmol/L (ref 135–145)

## 2022-01-30 LAB — ECHOCARDIOGRAM COMPLETE
Area-P 1/2: 2.2 cm2
Calc EF: 59 %
S' Lateral: 1.8 cm
Single Plane A2C EF: 53.3 %
Single Plane A4C EF: 65.1 %
Weight: 2160 oz

## 2022-01-30 LAB — GLUCOSE, CAPILLARY
Glucose-Capillary: 312 mg/dL — ABNORMAL HIGH (ref 70–99)
Glucose-Capillary: 309 mg/dL — ABNORMAL HIGH (ref 70–99)
Glucose-Capillary: 275 mg/dL — ABNORMAL HIGH (ref 70–99)

## 2022-01-30 LAB — HEMOGLOBIN A1C
Hgb A1c MFr Bld: 8.2 % — ABNORMAL HIGH (ref 4.8–5.6)
Mean Plasma Glucose: 188.64 mg/dL

## 2022-01-30 MED ORDER — INSULIN ASPART 100 UNIT/ML IJ SOLN: 0.0000 [IU] | Freq: Every day | INTRAMUSCULAR | Status: DC

## 2022-01-30 MED ORDER — ACETAMINOPHEN 500 MG PO TABS: 1000.0000 mg | ORAL_TABLET | ORAL | Status: DC

## 2022-01-30 MED ORDER — HYDROMORPHONE HCL 1 MG/ML IJ SOLN
0.5000 mg | INTRAMUSCULAR | Status: DC | PRN
  Administered 2022-01-30: 1 mg via INTRAVENOUS
  Filled 2022-01-30: qty 1

## 2022-01-30 MED ORDER — HALOPERIDOL 0.5 MG PO TABS
0.5000 mg | ORAL_TABLET | ORAL | Status: DC | PRN
Start: 2022-01-30 — End: 2022-01-31
  Filled 2022-01-30: qty 1

## 2022-01-30 MED ORDER — LORAZEPAM 1 MG PO TABS: 1.0000 mg | ORAL_TABLET | ORAL | Status: DC | PRN

## 2022-01-30 MED ORDER — ENSURE PRE-SURGERY PO LIQD
296.0000 mL | Freq: Once | ORAL | Status: DC
  Filled 2022-01-30: qty 296

## 2022-01-30 MED ORDER — PANTOPRAZOLE SODIUM 40 MG IV SOLR: 40.0000 mg | INTRAVENOUS | Status: DC

## 2022-01-30 MED ORDER — HALOPERIDOL LACTATE 2 MG/ML PO CONC
0.5000 mg | ORAL | Status: DC | PRN
Start: 2022-01-30 — End: 2022-01-31
  Filled 2022-01-30: qty 0.3

## 2022-01-30 MED ORDER — ENOXAPARIN SODIUM 40 MG/0.4ML IJ SOSY: 40.0000 mg | PREFILLED_SYRINGE | Freq: Once | INTRAMUSCULAR | Status: DC

## 2022-01-30 MED ORDER — CHLORHEXIDINE GLUCONATE CLOTH 2 % EX PADS: 6.0000 | MEDICATED_PAD | Freq: Once | CUTANEOUS | Status: DC

## 2022-01-30 MED ORDER — FUROSEMIDE 10 MG/ML IJ SOLN
60.0000 mg | Freq: Once | INTRAMUSCULAR | Status: DC
  Filled 2022-01-30: qty 6

## 2022-01-30 MED ORDER — INSULIN ASPART 100 UNIT/ML IJ SOLN
0.0000 [IU] | Freq: Three times a day (TID) | INTRAMUSCULAR | Status: DC
  Administered 2022-01-30: 7 [IU] via SUBCUTANEOUS

## 2022-01-30 MED ORDER — LORAZEPAM 2 MG/ML IJ SOLN
1.0000 mg | INTRAMUSCULAR | Status: DC | PRN
  Administered 2022-01-30: 1 mg via INTRAVENOUS
  Filled 2022-01-30: qty 1

## 2022-01-30 MED ORDER — INSULIN ASPART 100 UNIT/ML IJ SOLN: 0.0000 [IU] | INTRAMUSCULAR | Status: DC

## 2022-01-30 MED ORDER — ALVIMOPAN 12 MG PO CAPS
12.0000 mg | ORAL_CAPSULE | ORAL | Status: DC
  Filled 2022-01-30: qty 1

## 2022-01-30 MED ORDER — SODIUM CHLORIDE 0.9 % IV SOLN
2.0000 g | INTRAVENOUS | Status: DC
  Filled 2022-01-30: qty 2

## 2022-01-30 MED ORDER — NEOMYCIN SULFATE 500 MG PO TABS
1000.0000 mg | ORAL_TABLET | ORAL | Status: DC
  Filled 2022-01-30 (×3): qty 2

## 2022-01-30 MED ORDER — ENSURE PRE-SURGERY PO LIQD
592.0000 mL | Freq: Once | ORAL | Status: DC
  Filled 2022-01-30: qty 592

## 2022-01-30 MED ORDER — POLYETHYLENE GLYCOL 3350 17 GM/SCOOP PO POWD
1.0000 | Freq: Once | ORAL | Status: AC
  Administered 2022-01-30: 255 g via ORAL
  Filled 2022-01-30: qty 255

## 2022-01-30 MED ORDER — IPRATROPIUM-ALBUTEROL 0.5-2.5 (3) MG/3ML IN SOLN: 3.0000 mL | Freq: Four times a day (QID) | RESPIRATORY_TRACT | Status: DC | PRN

## 2022-01-30 MED ORDER — PIPERACILLIN-TAZOBACTAM 3.375 G IVPB
3.3750 g | Freq: Three times a day (TID) | INTRAVENOUS | Status: DC
Start: 2022-01-30 — End: 2022-01-31
  Filled 2022-01-30 (×3): qty 50

## 2022-01-30 MED ORDER — IPRATROPIUM-ALBUTEROL 0.5-2.5 (3) MG/3ML IN SOLN
3.0000 mL | Freq: Four times a day (QID) | RESPIRATORY_TRACT | Status: DC
  Administered 2022-01-30: 3 mL via RESPIRATORY_TRACT
  Filled 2022-01-30: qty 3

## 2022-01-30 MED ORDER — METRONIDAZOLE 500 MG PO TABS
1000.0000 mg | ORAL_TABLET | ORAL | Status: DC
  Administered 2022-01-30: 1000 mg via ORAL
  Filled 2022-01-30: qty 2

## 2022-01-30 MED ORDER — HALOPERIDOL LACTATE 5 MG/ML IJ SOLN: 0.5000 mg | INTRAMUSCULAR | Status: DC | PRN

## 2022-01-30 MED ORDER — LORAZEPAM 2 MG/ML PO CONC: 1.0000 mg | ORAL | Status: DC | PRN

## 2022-01-30 MED ORDER — SODIUM CHLORIDE 0.9 % IV SOLN: INTRAVENOUS | Status: DC

## 2022-01-30 MED ORDER — INSULIN GLARGINE-YFGN 100 UNIT/ML ~~LOC~~ SOLN
5.0000 [IU] | Freq: Every day | SUBCUTANEOUS | Status: DC
  Administered 2022-01-30: 5 [IU] via SUBCUTANEOUS
  Filled 2022-01-30: qty 0.05

## 2022-01-31 ENCOUNTER — Other Ambulatory Visit: Payer: Self-pay | Admitting: *Deleted

## 2022-01-31 ENCOUNTER — Telehealth: Payer: Self-pay | Admitting: Podiatry

## 2022-01-31 ENCOUNTER — Telehealth: Payer: Self-pay | Admitting: *Deleted

## 2022-01-31 NOTE — Telephone Encounter (Signed)
Transition Care Management Unsuccessful Follow-up Telephone Call  Date of discharge and from where:  02/06/2022 [facility]  Attempts:  1st Attempt  Reason for unsuccessful TCM follow-up call:  Unable to reach patient Patient passed on 2022/02/06

## 2022-01-31 NOTE — Telephone Encounter (Signed)
Pts niece called to let us know pt passed away yesterday. I did send our condolences.

## 2022-01-31 NOTE — Patient Outreach (Signed)
[facility]) Care Management  01/31/2022  Erin Carr 1930-07-30 [id number]   Member admitted to hospital on 2/25, comfort care measures in place, expired on 2/27.  Will close case this time.  Valente David, RN, MSN, [place] Manager [phone]

## 2022-02-01 NOTE — Progress Notes (Signed)
°  Echocardiogram 2D Echocardiogram has been performed.  Erin Carr 02-18-2022, 1:39 PM

## 2022-02-01 NOTE — Progress Notes (Signed)
HOSPITAL MEDICINE OVERNIGHT EVENT NOTE    Notified by nursing that patient has unfortunately just expired.  Chart reviewed.  Patient initially hospitalized for sigmoid volvulus complicated by a aspiration pneumonia and secondary acute hypoxic respiratory failure.  Patient was made comfort measures.  Patient developed progressive hypoxic failure before ultimately expiring.  Time of death 8:28 PM, 2 RNs pronounced.  Family is at the bedside at time of death.  All questions and concerns were addressed and answered to their satisfaction according to nursing.  Mortality services will be notified.    Vernelle Emerald  MD [facility]

## 2022-02-01 NOTE — Assessment & Plan Note (Signed)
See, problem Acute Hypoxic Respiratory failure.  She was started on nebulizer, made NPO, IV antibiotics.

## 2022-02-01 NOTE — Consult Note (Signed)
[facility] Nurse requested for preoperative stoma site marking  Discussed surgical procedure and stoma creation with patient and family.  Explained role of the [facility] nurse team.  Provided the patient with educational booklet and provided samples of pouching options.  Answered patient questions.   Examined patient sitting in recliner chair, she was able to sit up straight and sit back in order to place the marking in the patient's visual field, away from any creases or abdominal contour issues and within the rectus muscle.  Attempted to mark below the patient's belt line, states she wears her briefs right at the [employer].   Marked for colostomy in the LLQ 5.5 cm to the left of the umbilicus and 2 cm above the umbilicus.  Patient's abdomen cleansed with CHG wipes at site markings, allowed to air dry prior to marking. Covered mark with thin film transparent dressing to preserve mark until date of surgery.   [facility] Nurse team will follow up with patient after surgery for continue ostomy care and teaching.   Cathlean Marseilles Tamala Julian, MSN, RN, [place], Lysle Pearl, [facility] Wound Treatment Associate Pager [phone]

## 2022-02-01 NOTE — Consult Note (Signed)
Reason for Consult:Preop cardiac clearance for sigmoidectomy /colostomy Referring Physician: [facility] hospital[facility]  Erin Carr is an 88 y.o. female.  [id number] is 88 year old female with past medical history significant for coronary artery disease, history of non-Q-wave myocardial infarction in November 2018.  Noted to have mild diastolic congestive heart failure with preserved LV systolic function, moderate mitral regurgitation,hypertension, diabetes mellitus, hyperlipidemia, history of lacunar infarct in the past, adenocarcinoma of the right lung, status post radiation treatment in the past, history of radiation pneumonitis, COPD, asthma, degenerative joint disease, chronic kidney disease stage II, was admitted because of abdominal pain associated with constipation and was noted to have sigmoid volvulus.  Patient subsequently underwent colonoscopy and decompression of the sigmoid volvulus yesterday.  Patient tolerated the procedure well.  Cardiac consultation is called for cardiac clearance for possible sigmoidectomy/colostomy.  Patient denies any abdominal pain at present.  States has moved bowel movement once only since hospital admission.  Patient denies any chest pain, nausea, vomiting or diaphoresis.  Denies PND, orthopnea, leg swelling.  Denies palpitation, lightheadedness or syncope.  States her activity is limited, but able to walk with the help of walker without any anginal chest pain or shortness of breath.  EKG done in the ED showed early transition in the anterior leads.  No acute ischemic changes were noted.  Past Medical History:  Diagnosis Date   Adenocarcinoma of right lung, stage 1 ([place]) 11/16/2016   Arthritis    Asthma    COPD (chronic obstructive pulmonary disease) (HCC)    Diabetes mellitus without complication ([place])    Type II   Family history of adverse reaction to anesthesia    "aunt had PONV"   High cholesterol    History of radiation therapy 12/19/16-12/27/16   SBRT to  right upper lobe 54 Gy in 3 fractions   Hypercholesteremia    Hypertension    Osteoporosis    Positive PPD 11/2015   PER PCP   Shortness of breath dyspnea    Tremors of nervous system    Left hand   Urinary frequency    Vitamin D deficiency    Wears glasses     Past Surgical History:  Procedure Laterality Date   ABDOMINAL HYSTERECTOMY     ANTERIOR AND POSTERIOR REPAIR N/A 12/16/2014   Procedure: ANTERIOR (CYSTOCELE) AND POSTERIOR REPAIR (RECTOCELE);  Surgeon: Crawford Givens, MD;  Location: [facility];  Service: Gynecology;  Laterality: N/A;   APPENDECTOMY     BOWEL RESECTION     BREAST BIOPSY Left    COLONOSCOPY     VIDEO BRONCHOSCOPY WITH ENDOBRONCHIAL ULTRASOUND N/A 12/17/2015   Procedure: VIDEO BRONCHOSCOPY WITH ENDOBRONCHIAL ULTRASOUND;  Surgeon: Ivin Poot, MD;  Location: [facility];  Service: Thoracic;  Laterality: N/A;    Family History  Problem Relation Age of Onset   Hypertension Father    Diabetes Father     Social History:  reports that she quit smoking about 20 years ago. Her smoking use included cigarettes. She has a 20.00 pack-year smoking history. She has never used smokeless tobacco. She reports that she does not drink alcohol and does not use drugs.  Allergies: No Known Allergies  Medications: I have reviewed the patient's current medications.  Results for orders placed or performed during the hospital encounter of 01/05/2022 (from the past 48 hour(s))  Comprehensive metabolic panel     Status: Abnormal   Collection Time: 01/12/2022 11:25 PM  Result Value Ref Range   Sodium 130 (L) 135 - 145 mmol/L  Potassium 4.4 3.5 - 5.1 mmol/L   Chloride 97 (L) 98 - 111 mmol/L   CO2 24 22 - 32 mmol/L   Glucose, Bld 344 (H) 70 - 99 mg/dL    Comment: Glucose reference range applies only to samples taken after fasting for at least 8 hours.   BUN 18 8 - 23 mg/dL   Creatinine, Ser 1.18 (H) 0.44 - 1.00 mg/dL   Calcium 9.8 8.9 - 10.3 mg/dL   Total Protein 7.9 6.5 - 8.1 g/dL    Albumin 4.2 3.5 - 5.0 g/dL   AST 21 15 - 41 U/L   ALT 14 0 - 44 U/L   Alkaline Phosphatase 92 38 - 126 U/L   Total Bilirubin 0.4 0.3 - 1.2 mg/dL   GFR, Estimated 44 (L) >60 mL/min    Comment: (NOTE) Calculated using the CKD-EPI Creatinine Equation (2021)    Anion gap 9 5 - 15    Comment: Performed at [facility] [address], [place], [place] [address]  CBC with Differential     Status: Abnormal   Collection Time: 01/20/2022 11:25 PM  Result Value Ref Range   WBC 11.0 (H) 4.0 - 10.5 K/uL   RBC 4.71 3.87 - 5.11 MIL/uL   Hemoglobin 12.3 12.0 - 15.0 g/dL   HCT 40.9 36.0 - 46.0 %   MCV 86.8 80.0 - 100.0 fL   MCH 26.1 26.0 - 34.0 pg   MCHC 30.1 30.0 - 36.0 g/dL   RDW 14.8 11.5 - 15.5 %   Platelets 249 150 - 400 K/uL   nRBC 0.0 0.0 - 0.2 %   Neutrophils Relative % 81 %   Neutro Abs 9.0 (H) 1.7 - 7.7 K/uL   Lymphocytes Relative 10 %   Lymphs Abs 1.1 0.7 - 4.0 K/uL   Monocytes Relative 6 %   Monocytes Absolute 0.6 0.1 - 1.0 K/uL   Eosinophils Relative 2 %   Eosinophils Absolute 0.2 0.0 - 0.5 K/uL   Basophils Relative 0 %   Basophils Absolute 0.0 0.0 - 0.1 K/uL   Immature Granulocytes 1 %   Abs Immature Granulocytes 0.06 0.00 - 0.07 K/uL    Comment: Performed at [facility] [address] [place], [place] [address]  Lipase, blood     Status: Abnormal   Collection Time: 01/14/2022 11:25 PM  Result Value Ref Range   Lipase 93 (H) 11 - 51 U/L    Comment: Performed at [facility] [address], [place], [place] [address]  Magnesium     Status: None   Collection Time: 01/21/2022 11:25 PM  Result Value Ref Range   Magnesium 2.0 1.7 - 2.4 mg/dL    Comment: Performed at [facility], [place] [address] [place],  [address]  Resp Panel by RT-PCR (Flu A&B, Covid) Nasopharyngeal Swab     Status: None   Collection Time: 01/19/2022  2:47 AM   Specimen: Nasopharyngeal Swab; Nasopharyngeal(NP) swabs in vial transport medium  Result Value Ref Range   SARS  Coronavirus 2 by RT PCR NEGATIVE NEGATIVE    Comment: (NOTE) SARS-CoV-2 target nucleic acids are NOT DETECTED.  The SARS-CoV-2 RNA is generally detectable in upper respiratory specimens during the acute phase of infection. The lowest concentration of SARS-CoV-2 viral copies this assay can detect is 138 copies/mL. A negative result does not preclude SARS-Cov-2 infection and should not be used as the sole basis for treatment or other patient management decisions. A  negative result may occur with  improper specimen collection/handling, submission of specimen other than nasopharyngeal swab, presence of viral mutation(s) within the areas targeted by this assay, and inadequate number of viral copies(<138 copies/mL). A negative result must be combined with clinical observations, patient history, and epidemiological information. The expected result is Negative.  Fact Sheet for Patients:  EntrepreneurPulse.com.au  Fact Sheet for Healthcare Providers:  IncredibleEmployment.be  This test is no t yet approved or cleared by the [place] FDA and  has been authorized for detection and/or diagnosis of SARS-CoV-2 by FDA under an Emergency Use Authorization (EUA). This EUA will remain  in effect (meaning this test can be used) for the duration of the COVID-19 declaration under Section 564(b)(1) of the Act, 21 U.S.C.section 360bbb-3(b)(1), unless the authorization is terminated  or revoked sooner.       Influenza A by PCR NEGATIVE NEGATIVE   Influenza B by PCR NEGATIVE NEGATIVE    Comment: (NOTE) The Xpert Xpress SARS-CoV-2/FLU/RSV plus assay is intended as an aid in the diagnosis of influenza from Nasopharyngeal swab specimens and should not be used as a sole basis for treatment. Nasal washings and aspirates are unacceptable for Xpert Xpress SARS-CoV-2/FLU/RSV testing.  Fact Sheet for Patients: EntrepreneurPulse.com.au  Fact Sheet  for Healthcare Providers: IncredibleEmployment.be  This test is not yet approved or cleared by the [place] FDA and has been authorized for detection and/or diagnosis of SARS-CoV-2 by FDA under an Emergency Use Authorization (EUA). This EUA will remain in effect (meaning this test can be used) for the duration of the COVID-19 declaration under Section 564(b)(1) of the Act, 21 U.S.C. section 360bbb-3(b)(1), unless the authorization is terminated or revoked.  Performed at [facility], [place] [address] [place], [place] [address]   Glucose, capillary     Status: Abnormal   Collection Time: 01/25/2022  5:59 AM  Result Value Ref Range   Glucose-Capillary 302 (H) 70 - 99 mg/dL    Comment: Glucose reference range applies only to samples taken after fasting for at least 8 hours.  Glucose, capillary     Status: Abnormal   Collection Time: 01/27/2022  7:10 AM  Result Value Ref Range   Glucose-Capillary 310 (H) 70 - 99 mg/dL    Comment: Glucose reference range applies only to samples taken after fasting for at least 8 hours.  Hemoglobin A1c     Status: Abnormal   Collection Time: 01/12/2022  8:24 AM  Result Value Ref Range   Hgb A1c MFr Bld 8.2 (H) 4.8 - 5.6 %    Comment: (NOTE) Pre diabetes:          5.7%-6.4%  Diabetes:              >6.4%  Glycemic control for   <7.0% adults with diabetes    Mean Plasma Glucose 188.64 mg/dL    Comment: Performed at [facility] [address] [place], [place] [address]  Basic metabolic panel     Status: Abnormal   Collection Time: 01/06/2022  8:24 AM  Result Value Ref Range   Sodium 133 (L) 135 - 145 mmol/L   Potassium 3.7 3.5 - 5.1 mmol/L   Chloride 99 98 - 111 mmol/L   CO2 21 (L) 22 - 32 mmol/L   Glucose, Bld 303 (H) 70 - 99 mg/dL    Comment: Glucose reference range applies only to samples taken after fasting for at least 8 hours.   BUN 19 8 - 23 mg/dL  Creatinine, Ser 1.10 (H) 0.44 - 1.00 mg/dL   Calcium 9.6 8.9  - 10.3 mg/dL   GFR, Estimated 48 (L) >60 mL/min    Comment: (NOTE) Calculated using the CKD-EPI Creatinine Equation (2021)    Anion gap 13 5 - 15    Comment: Performed at [facility] [address], [place], [place] [address]  CBC     Status: Abnormal   Collection Time: 01/06/2022  8:24 AM  Result Value Ref Range   WBC 13.6 (H) 4.0 - 10.5 K/uL   RBC 4.77 3.87 - 5.11 MIL/uL   Hemoglobin 12.6 12.0 - 15.0 g/dL   HCT 40.7 36.0 - 46.0 %   MCV 85.3 80.0 - 100.0 fL   MCH 26.4 26.0 - 34.0 pg   MCHC 31.0 30.0 - 36.0 g/dL   RDW 14.8 11.5 - 15.5 %   Platelets 237 150 - 400 K/uL   nRBC 0.0 0.0 - 0.2 %    Comment: Performed at [facility], [place] [address], [place], [place] [address]  Glucose, capillary     Status: Abnormal   Collection Time: 01/31/2022  8:50 AM  Result Value Ref Range   Glucose-Capillary 270 (H) 70 - 99 mg/dL    Comment: Glucose reference range applies only to samples taken after fasting for at least 8 hours.  Glucose, capillary     Status: Abnormal   Collection Time: 01/24/2022 11:42 AM  Result Value Ref Range   Glucose-Capillary 175 (H) 70 - 99 mg/dL    Comment: Glucose reference range applies only to samples taken after fasting for at least 8 hours.  Glucose, capillary     Status: None   Collection Time: 01/16/2022  3:59 PM  Result Value Ref Range   Glucose-Capillary 99 70 - 99 mg/dL    Comment: Glucose reference range applies only to samples taken after fasting for at least 8 hours.  Glucose, capillary     Status: Abnormal   Collection Time: 01/15/2022  7:29 PM  Result Value Ref Range   Glucose-Capillary 155 (H) 70 - 99 mg/dL    Comment: Glucose reference range applies only to samples taken after fasting for at least 8 hours.  CBC     Status: Abnormal   Collection Time: 2022/02/17  1:30 AM  Result Value Ref Range   WBC 15.3 (H) 4.0 - 10.5 K/uL   RBC 5.01 3.87 - 5.11 MIL/uL   Hemoglobin 13.4 12.0 - 15.0 g/dL   HCT 42.1 36.0 - 46.0 %   MCV 84.0 80.0 - 100.0 fL    MCH 26.7 26.0 - 34.0 pg   MCHC 31.8 30.0 - 36.0 g/dL   RDW 15.0 11.5 - 15.5 %   Platelets 253 150 - 400 K/uL   nRBC 0.0 0.0 - 0.2 %    Comment: Performed at [facility], [place] [address], [place], [place] [address]  Basic metabolic panel     Status: Abnormal   Collection Time: 02-17-2022  1:30 AM  Result Value Ref Range   Sodium 136 135 - 145 mmol/L   Potassium 4.0 3.5 - 5.1 mmol/L   Chloride 102 98 - 111 mmol/L   CO2 24 22 - 32 mmol/L   Glucose, Bld 345 (H) 70 - 99 mg/dL    Comment: Glucose reference range applies only to samples taken after fasting for at least 8 hours.   BUN 22 8 - 23 mg/dL   Creatinine, Ser 1.20 (H) 0.44 -  1.00 mg/dL   Calcium 9.2 8.9 - 10.3 mg/dL   GFR, Estimated 43 (L) >60 mL/min    Comment: (NOTE) Calculated using the CKD-EPI Creatinine Equation (2021)    Anion gap 10 5 - 15    Comment: Performed at [facility], [place] [address], [place], [place] [address]  Glucose, capillary     Status: Abnormal   Collection Time: January 31, 2022  7:41 AM  Result Value Ref Range   Glucose-Capillary 312 (H) 70 - 99 mg/dL    Comment: Glucose reference range applies only to samples taken after fasting for at least 8 hours.    CT Abdomen Pelvis W Contrast  Result Date: 01/05/2022 CLINICAL DATA:  Nausea, vomiting, abdominal pain EXAM: CT ABDOMEN AND PELVIS WITH CONTRAST TECHNIQUE: Multidetector CT imaging of the abdomen and pelvis was performed using the standard protocol following bolus administration of intravenous contrast. RADIATION DOSE REDUCTION: This exam was performed according to the departmental dose-optimization program which includes automated exposure control, adjustment of the mA and/or kV according to patient size and/or use of iterative reconstruction technique. CONTRAST:  66mL OMNIPAQUE IOHEXOL 300 MG/ML  SOLN COMPARISON:  02/16/2010 FINDINGS: Lower chest: Small to moderate-sized hiatal hernia, enlarged. Coronary artery and aortic calcifications. No acute  abnormality. Hepatobiliary: No focal hepatic abnormality. Gallbladder unremarkable. Pancreas: No focal abnormality or ductal dilatation. Spleen: No focal abnormality.  Normal size. Adrenals/Urinary Tract: Bilateral renal cysts. No hydronephrosis. X renal pelves bilaterally. Urinary bladder and adrenal glands unremarkable. Stomach/Bowel: Gaseous distention of the sigmoid colon. Inferiorly in the pelvis, the sigmoid colon twists compatible with sigmoid volvulus. Large stool burden in the sigmoid colon and proximal to the sigmoid volvulus. Distally, the rectum is decompressed. Small bowel decompressed. Vascular/Lymphatic: Aortic atherosclerosis. No evidence of aneurysm or adenopathy. Reproductive: Prior hysterectomy.  No adnexal masses. Other: No free fluid or free air. Musculoskeletal: No acute bony abnormality. Degenerative changes in the lumbar spine. IMPRESSION: Twisting of the sigmoid colon with dilated sigmoid colon extending into the upper abdomen compatible with sigmoid volvulus. Large amount of stool in the colon within the sigmoid volvulus and proximal to the volvulus. Small to moderate hiatal hernia. Bilateral renal cysts. Aortic atherosclerosis. These results were called by telephone at the time of interpretation on 01/27/2022 at 2:38 am to provider [facility] , who verbally acknowledged these results. Electronically Signed   By: Rolm Baptise M.D.   On: 01/04/2022 02:44   DG Abd 2 Views  Result Date: 2022/01/31 CLINICAL DATA:  Abdominal pain and distention EXAM: ABDOMEN - 2 VIEW COMPARISON:  Previous studies including the examination of 01/25/2022 FINDINGS: There is interval worsening of dilation of sigmoid colon measuring up to 7.7 cm. There is moderate dilation of the descending colon. Large amount of stool is seen in the proximal colon. No abnormal masses or calcifications are seen. Degenerative changes are noted in the lumbar spine. IMPRESSION: There is interval worsening of gaseous dilation of  sigmoid colon measuring up to 7.7 cm in diameter. Findings are consistent with sigmoid volvulus with possible worsening of obstruction. Electronically Signed   By: Elmer Picker M.D.   On: 01-31-22 09:01   DG Abd 2 Views  Result Date: 01/27/2022 CLINICAL DATA:  Constipation EXAM: ABDOMEN - 2 VIEW COMPARISON:  08/29/2005 FINDINGS: Large stool burden throughout the colon. Gaseous distention of the sigmoid colon which extends into the upper abdomen. No free air or organomegaly. Visualized lung bases clear. IMPRESSION: Gaseous distention of the sigmoid colon. Cannot exclude sigmoid volvulus. Large stool  burden throughout the colon. Abdominal CT may be helpful for further evaluation. Electronically Signed   By: Rolm Baptise M.D.   On: 01/18/2022 23:40   DG Abd Portable 2V  Result Date: 01/26/2022 CLINICAL DATA:  Sigmoid volvulus EXAM: PORTABLE ABDOMEN - 2 VIEW COMPARISON:  CT 01/23/2022 FINDINGS: Diffuse stool and gas above the previously demonstrated sigmoid volvulus which presumably persists given unchanged bowel gas pattern with high extending sigmoid loop. Contrast in the bladder from recent scan. No concerning gas collection or mass effect. Advanced lumbar spine degeneration IMPRESSION: Unchanged stool retention and colonic orientation compatible with persistent sigmoid volvulus. Electronically Signed   By: Jorje Guild M.D.   On: 01/18/2022 08:09    Review of Systems  Constitutional:  Negative for diaphoresis.  HENT:  Negative for sore throat.   Eyes:  Negative for discharge.  Respiratory:  Negative for cough and shortness of breath.   Cardiovascular:  Negative for chest pain, palpitations and leg swelling.  Gastrointestinal:  Positive for abdominal distention, abdominal pain and constipation. Negative for blood in stool.  Genitourinary:  Negative for difficulty urinating.  Neurological:  Negative for dizziness and seizures.  Blood pressure (!) 151/121, pulse 92, temperature 98.3 F  (36.8 C), temperature source Oral, resp. rate 17, weight 61.2 kg, SpO2 97 %. Physical Exam Constitutional:      Appearance: She is well-developed.  HENT:     Head: Normocephalic and atraumatic.  Eyes:     Extraocular Movements: Extraocular movements intact.  Cardiovascular:     Rate and Rhythm: Normal rate and regular rhythm.     Heart sounds: Murmur (2/6 systolic murmur noted.  No S3 gallop) heard.  Pulmonary:     Effort: Pulmonary effort is normal. No respiratory distress.     Breath sounds: Normal breath sounds. No wheezing, rhonchi or rales.  Abdominal:     General: A surgical scar is present. Bowel sounds are decreased. There is distension.     Palpations: Abdomen is soft.     Tenderness: There is abdominal tenderness in the left lower quadrant.  Skin:    General: Skin is warm and dry.  Neurological:     General: No focal deficit present.     Mental Status: She is alert and oriented to person, place, and time.    Assessment/Plan: Sigmoid volvulus, status post colonoscopy/decompression of fall with [place]. Coronary artery disease, history of small non-Q-wave MI in November 2018 Compensated congestive heart failure secondary to preserved LV systolic function. Hypertension. Hyperlipidemia. Diabetes mellitus. Chronic kidney disease stage II History of lacunar infarct. History of adenocarcinoma of the right lung, status post radiation. History of radiation pneumonitis. COPD History of multiple laparotomies in the past. Plan Patient is high risk for any surgical intervention due to her age and multiple comorbidities, although clinically she has done well over the period of last 4+ years without any  invasive cardiac interventions.  Patient opted for medical management only in the past. Will get 2-D echo in anticipation for the surgery to check LV systolic/diastolic function and also evaluate for mitral regurgitation.  Charolette Forward 2022/02/08, 11:17 AM

## 2022-02-01 NOTE — Progress Notes (Signed)
General Surgery Follow Up Note  Subjective:    Overnight Issues:   Objective:  Vital signs for last 24 hours: Temp:  [97.6 F (36.4 C)-98.4 F (36.9 C)] 98.3 F (36.8 C) (02/27 0739) Pulse Rate:  [82-95] 92 (02/27 0848) Resp:  [17-20] 17 (02/27 0739) BP: (132-168)/(72-121) 151/121 (02/27 0848) SpO2:  [84 %-98 %] 97 % (02/27 0739)  Hemodynamic parameters for last 24 hours:    Intake/Output from previous day: 02/26 0701 - 02/27 0700 In: -  Out: 650 [Urine:650]  Intake/Output this shift: No intake/output data recorded.  Vent settings for last 24 hours:    Physical Exam:  Gen: comfortable, no distress Neuro: non-focal exam HEENT: PERRL Neck: supple CV: RRR Pulm: unlabored breathing Abd: soft, NTm well healed midline scar GU: clear yellow urine Extr: wwp, no edema   Results for orders placed or performed during the hospital encounter of 01/19/2022 (from the past 24 hour(s))  Glucose, capillary     Status: Abnormal   Collection Time: 01/31/2022 11:42 AM  Result Value Ref Range   Glucose-Capillary 175 (H) 70 - 99 mg/dL  Glucose, capillary     Status: None   Collection Time: 01/10/2022  3:59 PM  Result Value Ref Range   Glucose-Capillary 99 70 - 99 mg/dL  Glucose, capillary     Status: Abnormal   Collection Time: 01/26/2022  7:29 PM  Result Value Ref Range   Glucose-Capillary 155 (H) 70 - 99 mg/dL  CBC     Status: Abnormal   Collection Time: Feb 23, 2022  1:30 AM  Result Value Ref Range   WBC 15.3 (H) 4.0 - 10.5 K/uL   RBC 5.01 3.87 - 5.11 MIL/uL   Hemoglobin 13.4 12.0 - 15.0 g/dL   HCT 42.1 36.0 - 46.0 %   MCV 84.0 80.0 - 100.0 fL   MCH 26.7 26.0 - 34.0 pg   MCHC 31.8 30.0 - 36.0 g/dL   RDW 15.0 11.5 - 15.5 %   Platelets 253 150 - 400 K/uL   nRBC 0.0 0.0 - 0.2 %  Basic metabolic panel     Status: Abnormal   Collection Time: 02-23-2022  1:30 AM  Result Value Ref Range   Sodium 136 135 - 145 mmol/L   Potassium 4.0 3.5 - 5.1 mmol/L   Chloride 102 98 - 111 mmol/L    CO2 24 22 - 32 mmol/L   Glucose, Bld 345 (H) 70 - 99 mg/dL   BUN 22 8 - 23 mg/dL   Creatinine, Ser 1.20 (H) 0.44 - 1.00 mg/dL   Calcium 9.2 8.9 - 10.3 mg/dL   GFR, Estimated 43 (L) >60 mL/min   Anion gap 10 5 - 15  Glucose, capillary     Status: Abnormal   Collection Time: 02-23-2022  7:41 AM  Result Value Ref Range   Glucose-Capillary 312 (H) 70 - 99 mg/dL    Assessment & Plan:  Present on Admission:  Sigmoid volvulus ([place])  Hypertension    LOS: 1 day   Additional comments:I reviewed the patient's new clinical lab test results.   and I reviewed the patients new imaging test results.    Sigmoid volvulus - s/p endoscopic decompression by GI. Discussion with patient and niece regarding surgical intervention and patient is agreeable. We discussed the possibility of lap assisted vs open and primary anastomosis vs colostomy. She was counseled that she would need pre-op risk stratification by cardiology and that risk stratification coupled with surgeon preference would ultimately dictate final surgical  decision. Patient is agreeable to colostomy if deemed indicated. Pending cardiology eval, may be able to schedule as early as 2/28 and if so, will begin prep today. FEN - CLD DVT - SCDs Dispo - 5W   Jesusita Oka, MD Trauma & General Surgery Please use [email or web address] to contact on call provider  02-07-22  *Care during the described time interval was provided by me. I have reviewed this patient's available data, including medical history, events of note, physical examination and test results as part of my evaluation.

## 2022-02-01 NOTE — Consult Note (Signed)
NAME:  Erin Carr, MRN:  [id number], DOB:  1930-08-13, LOS: 1 ADMISSION DATE:  01/31/2022, CONSULTATION DATE:  02/03/2022 REFERRING MD:  Tyrell Antonio, CHIEF COMPLAINT:  aspiration, increasing O2 requirements   History of Present Illness:  [name] is an 88 yo with PMH of type 2DM, COPD, HFpEF, CKD stage III, HTN, hx of adenocarcinoma of right lung s/p radiotherapy who initially presented on 2/26 due to abdominal pain and found to have sigmoid volvulus.Prior to coming into hospital patient able to ambulate with walker, cook for herself, and complete most ADLs.  Niece lives next door and helps with other ADLs. 2/27 her niece states that at lunch today, patient began to get choked on serbert while eating. She was not on oxygen prior to event, now on 10L HFNC.  Pertinent  Medical History  Asthma History of [facility] Events: Including procedures, antibiotic start and stop dates in addition to other pertinent events   2/26 admitted for sigmoid volvulus, Flex sig with decompression of volvulus  Interim History / Subjective:  N/A  Objective   Blood pressure 110/85, pulse 73, temperature 98.1 F (36.7 C), temperature source Oral, resp. rate (!) 23, weight 61.2 kg, SpO2 92 %.        Intake/Output Summary (Last 24 hours) at 2022/02/03 1655 Last data filed at 2022-02-03 1962 Gross per 24 hour  Intake --  Output 250 ml  Net -250 ml   Filed Weights   01/31/2022 0551  Weight: 61.2 kg    Examination: General: frail appearing elderly women on 15 L supplemental oxygen using accessory muscles  HENT: NCAT,EOMI, trachea midline Lungs: Increased work of breathing, bilateral rhonchi, no wheezing  Cardiovascular: RRR , no murmur Abdomen: distended, firm, hypoactive bowel sounds, NT Extremities: no lower extremity edema, warm Neuro: Alert, oriented x 3, no focal deficits   Resolved Hospital Problem list     Assessment & Plan:  Acute respiratory failure with hypoxia  secondary to aspiration pneumonia - Acute aspiration today at lunch, new bilateral opacities, and now requiring 15 L nonrebreather sats greater than 92%, using some accessory muscles in her neck. Patient is very weak. Plan to try to avoid intubation. See attending attestation for goals of care discussion - Possible component of pulmonary edema on personal review of chest xray,  On p.o. Lasix 60 mg at home.   - strict I/O's , dailey weights  - IV 60 mg once - Continue Zosyn   Sigmoid volvulus  - GI consulting. Underwent Flex Sig for decompression , unsuccessful and has persistent sigmoid volvulus. - Surgeon, Dr.Lovick , and was planning for possible surgery before aspiration event - Plan per GI and Surgery  CKD stage III -Baseline Cr 1.1 to 1.2 -Continue to monitor  Type 2 diabetes mellitus with hyperglycemia - Hemoglobin A1c 8.2. On Metformin at home. - Blood glucose goal 140 -180. Blood glucose 300 today - On Glargine 5 units and SSI-S with QHS - Q4 CBG's now that NPO  Best Practice (right click and "Reselect all SmartList Selections" daily)   Diet/type: NPO DVT prophylaxis: LMWH GI prophylaxis: N/A Lines: N/A Foley:  N/A Code Status:  DNR Last date of multidisciplinary goals of care discussion [x]   Labs   CBC: Recent Labs  Lab 01/06/2022 2325 01/31/2022 0824 02-03-22 0130  WBC 11.0* 13.6* 15.3*  NEUTROABS 9.0*  --   --   HGB 12.3 12.6 13.4  HCT 40.9 40.7 42.1  MCV 86.8 85.3 84.0  PLT 249 237 253  Basic Metabolic Panel: Recent Labs  Lab 01/04/2022 2325 01/19/2022 0824 02/04/2022 0130  NA 130* 133* 136  K 4.4 3.7 4.0  CL 97* 99 102  CO2 24 21* 24  GLUCOSE 344* 303* 345*  BUN 18 19 22   CREATININE 1.18* 1.10* 1.20*  CALCIUM 9.8 9.6 9.2  MG 2.0  --   --    GFR: Estimated Creatinine Clearance: 27.3 mL/min (A) (by C-G formula based on SCr of 1.2 mg/dL (H)). Recent Labs  Lab 01/15/2022 2325 01/27/2022 0824 February 04, 2022 0130  WBC 11.0* 13.6* 15.3*    Liver Function  Tests: Recent Labs  Lab 01/12/2022 2325  AST 21  ALT 14  ALKPHOS 92  BILITOT 0.4  PROT 7.9  ALBUMIN 4.2   Recent Labs  Lab 01/22/2022 2325  LIPASE 93*   No results for input(s): AMMONIA in the last 168 hours.  ABG    Component Value Date/Time   TCO2 29 02/08/2010 0013     Coagulation Profile: No results for input(s): INR, PROTIME in the last 168 hours.  Cardiac Enzymes: No results for input(s): CKTOTAL, CKMB, CKMBINDEX, TROPONINI in the last 168 hours.  HbA1C: Hgb A1c MFr Bld  Date/Time Value Ref Range Status  February 04, 2022 01:30 AM 8.2 (H) 4.8 - 5.6 % Final    Comment:    (NOTE) Pre diabetes:          5.7%-6.4%  Diabetes:              >6.4%  Glycemic control for   <7.0% adults with diabetes   01/22/2022 08:24 AM 8.2 (H) 4.8 - 5.6 % Final    Comment:    (NOTE) Pre diabetes:          5.7%-6.4%  Diabetes:              >6.4%  Glycemic control for   <7.0% adults with diabetes     CBG: Recent Labs  Lab 01/15/2022 1142 01/26/2022 1559 01/18/2022 1929 02/04/2022 0741 02-04-2022 1212  GLUCAP 175* 99 155* 312* 309*    Review of Systems:   Per HPI.   Past Medical History:  She,  has a past medical history of Adenocarcinoma of right lung, stage 1 ([place]) (11/16/2016), Arthritis, Asthma, COPD (chronic obstructive pulmonary disease) ([place]), Diabetes mellitus without complication ([place]), Family history of adverse reaction to anesthesia, High cholesterol, History of radiation therapy (12/19/16-12/27/16), Hypercholesteremia, Hypertension, Osteoporosis, Positive PPD (11/2015), Shortness of breath dyspnea, Tremors of nervous system, Urinary frequency, Vitamin D deficiency, and Wears glasses.   Surgical History:   Past Surgical History:  Procedure Laterality Date   ABDOMINAL HYSTERECTOMY     ANTERIOR AND POSTERIOR REPAIR N/A 12/16/2014   Procedure: ANTERIOR (CYSTOCELE) AND POSTERIOR REPAIR (RECTOCELE);  Surgeon: Crawford Givens, MD;  Location: [facility];  Service: Gynecology;   Laterality: N/A;   APPENDECTOMY     BOWEL RESECTION     BREAST BIOPSY Left    COLONOSCOPY     VIDEO BRONCHOSCOPY WITH ENDOBRONCHIAL ULTRASOUND N/A 12/17/2015   Procedure: VIDEO BRONCHOSCOPY WITH ENDOBRONCHIAL ULTRASOUND;  Surgeon: Ivin Poot, MD;  Location: [facility];  Service: Thoracic;  Laterality: N/A;     Social History:   reports that she quit smoking about 20 years ago. Her smoking use included cigarettes. She has a 20.00 pack-year smoking history. She has never used smokeless tobacco. She reports that she does not drink alcohol and does not use drugs.   Family History:  Her family history includes Diabetes in her father; Hypertension  in her father.   Allergies No Known Allergies   Home Medications  Prior to Admission medications   Medication Sig Start Date End Date Taking? Authorizing Provider  amLODipine (NORVASC) 10 MG tablet Take 1 tablet (10 mg total) by mouth daily. 09/12/21  Yes Ngetich, Dinah C, NP  aspirin EC 81 MG tablet Take 81 mg by mouth daily.   Yes [provider]  atorvastatin (LIPITOR) 40 MG tablet Take 1 tablet (40 mg total) by mouth daily at 6 PM. 09/12/21 01/19/2022 Yes Ngetich, Dinah C, NP  brimonidine (ALPHAGAN) 0.2 % ophthalmic solution Place 1 drop into the left eye 2 (two) times daily.   Yes [provider]  docusate sodium (COLACE) 100 MG capsule Take 100 mg by mouth at bedtime.   Yes [provider]  furosemide (LASIX) 20 MG tablet TAKE 3 TABLETS BY MOUTH EVERY MORNING AND EVERY EVENING Patient taking differently: Take 40 mg by mouth daily. 01/04/22  Yes Ngetich, Dinah C, NP  gabapentin (NEURONTIN) 400 MG capsule Take 400 mg by mouth 2 (two) times daily.   Yes [provider]  latanoprost (XALATAN) 0.005 % ophthalmic solution Place 1 drop into the left eye at bedtime. 10/16/20  Yes [provider]  lisinopril (PRINIVIL,ZESTRIL) 20 MG tablet Take 20 mg by mouth 2 (two) times daily.   Yes [provider]   metFORMIN (GLUCOPHAGE) 500 MG tablet Take 1 tablet (500 mg total) by mouth 2 (two) times daily with a meal. 12/01/21 03/01/22 Yes Ngetich, Dinah C, NP  metoprolol tartrate (LOPRESSOR) 25 MG tablet Take 1 tablet (25 mg total) by mouth 2 (two) times daily. 01/04/22  Yes Ngetich, Dinah C, NP  Netarsudil Dimesylate (RHOPRESSA) 0.02 % SOLN Place 1 drop into the left eye daily.   Yes [provider]  polyethylene glycol powder (GLYCOLAX/MIRALAX) 17 GM/SCOOP powder Take 17 g by mouth daily. Hold for loose stool 09/12/21  Yes Ngetich, Dinah C, NP  potassium chloride (KLOR-CON) 10 MEQ tablet Take 1 tablet (10 mEq total) by mouth daily. 09/12/21  Yes Ngetich, Dinah C, NP  Vitamin D, Ergocalciferol, (DRISDOL) 1.25 MG (50000 UNIT) CAPS capsule Take 50,000 Units by mouth every 14 (fourteen) days.   Yes [provider]  gabapentin (NEURONTIN) 100 MG capsule Take 2 capsules (200 mg total) by mouth 2 (two) times daily. Patient not taking: Reported on 01/24/2022 06/10/21   Patrecia Pour, MD     Critical care time:     Tamsen Snider, MD PGY3 Internal Medicine [phone]

## 2022-02-01 NOTE — Progress Notes (Signed)
°  Progress Note   Patient: Erin Carr KZS:[id number] DOB: [birth year] DOA: 01/06/2022     1 DOS: the patient was seen and examined on 01-31-2022   Brief hospital course: 88 year old past medical history significant for diabetes type 2, COPD, heart failure preserved ejection fraction CKD stage III, hypertension, history of adenocarcinoma of right lung status post treatment, presenting for evaluation of worsening abdominal pain.  She has been constipated since last week.  She has a past medical history significant for exploratory laparotomy and additional lysis in 2006, status post hysterectomy.  CT abdomen and pelvis consistent with sigmoid volvulus. GI and surgery consulted.  Patient underwent flexible sigmoidoscopy with decompression of sigmoid volvulus.   Patient had aspiration event while eating. Chest x ray consistent with PNA> Made NPO, Nebulizer.    Assessment and Plan: * Sigmoid volvulus ([place])- (present on admission) Patient presented with abdominal pain, constipation for more than 1 week, CT abdomen and pelvis consistent with sigmoid volvulus. -GI  consulted, underwent sigmoidoscopy for decompression, which  was successful. -Surgery following, recommending lap assisted vs open and primary anastomosis vs colostomy.  Cardiology consulted for risk assessment.   Acute respiratory failure with hypoxia ([place]) Patient had episode of aspiration PNA.  Oxygen drop to 69. Now on NB mask.  Nebulizer tx order.  IV zosyn order.  Will consult CCM  Hyponatremia Setting of poor oral intake.  Continue with IV fluids. Component of  pseudohyponatremia due to hyperglycemia  CKD (chronic kidney disease) stage 3, GFR 30-59 ml/min (HCC) CKD stage IIIb Creatinine baseline 1.1--1.2 Monitor, continue with IV fluid  Hypertension- (present on admission) Resume metoprolol and Norvasc. Hold lisinopril and Lasix for now.  Type 2 diabetes mellitus without complication (HCC) Type 2 diabetes  uncontrolled, with hyperglycemia. Hold metformin. Continue with sliding scale insulin.  If CBG continue to be elevated might need long-acting insulin    Pressure injury coccyx on the right stage 3. POA; wound care.      Subjective:  Seen this am, she was doing ok. She denies worsening abdominal pain.  This afternoon she aspirating while drinking lunch.   Physical Exam: Vitals:   January 31, 2022 0739 Jan 31, 2022 0848 2022-01-31 1211 2022-01-31 1534  BP: 132/78 (!) 151/121 110/85   Pulse: 82 92 73   Resp: 17  (!) 23   Temp: 98.3 F (36.8 C)  98.1 F (36.7 C)   TempSrc: Oral  Oral   SpO2: 97%  94% 92%  Weight:       General; NAD CVS; S 1, S 2 RRR Lungs: BL ronchus.   Data Reviewed:  Chest x ray, labs reviewed.   Family Communication: Niece who was at bedside.   Disposition: Status is: Inpatient Remains inpatient appropriate because: respiratory failure, Sigmoid volvulus.           Planned Discharge Destination:  to be determine.      Time spent: 45 minutes  Author: Elmarie Shiley, MD 2022-01-31 4:35 PM  For on call review [email or web address].

## 2022-02-01 NOTE — Assessment & Plan Note (Addendum)
Patient had episode of aspiration PNA.  Oxygen drop to 69. She was placed on NB mask.  Nebulizer tx order.  Started IV zosyn order.  CCM consulted, Dr Silas Flood discussed with family, patient was made DNR. Subsequently patient became more lethargic, fixed gazed. I discussed with family and patient was transition to comfort care measure. Comfort measure medications ordered

## 2022-02-01 NOTE — Progress Notes (Signed)
Pharmacy Antibiotic Note  Erin Carr is a 88 y.o. female admitted on 01/20/2022 now with potential aspiration. Pharmacy has been consulted for Zosyn dosing for aspiration pneumonia.  Patient is also on Day 1/3 of presurgical antibiotic prophylaxis with neomycin and Flagyl. Plans for lap assisted vs open primary anastomosis vs colostomy.  Plan: Zosyn 3.375g IV q8h (4 hour infusion). Monitor clinical progress, cultures/sensitivities, renal function, abx plan   Weight: 61.2 kg (135 lb)  Temp (24hrs), Avg:98.3 F (36.8 C), Min:98.1 F (36.7 C), Max:98.4 F (36.9 C)  Recent Labs  Lab 01/05/2022 2325 01/08/2022 0824 2022/02/14 0130  WBC 11.0* 13.6* 15.3*  CREATININE 1.18* 1.10* 1.20*    Estimated Creatinine Clearance: 27.3 mL/min (A) (by C-G formula based on SCr of 1.2 mg/dL (H)).    No Known Allergies  Antimicrobials this admission: 2/27 Zosyn >>    Dose adjustments this admission:   Microbiology results:   Thank you for allowing [place] to participate in this patients care. Jens Som, PharmD 02-14-2022 4:06 PM  **Pharmacist phone directory can be found on [email or web address] listed under [place]**

## 2022-02-01 NOTE — Significant Event (Signed)
Rapid Response Event Note   Reason for Call :  Change in vision  Initial Focused Assessment:  Called to come assess this patient. Upon entering the room the patient had a fixed upward gaze which was new. Patient was apparently conversing before this. Patient does not blink to threat. Patient is on NRB and has decreased oxygen saturation and is dyspneic.    Plan of Care:  Patient transitioned to comfort care. Comfort care tray ordered for family. Comfort medications held until family can come and say their goodbyes to the patient. Monitor changed to comfort care screen to minimize alarms and to promote family focusing on their loved one during this difficult time.    Event Summary:   MD Notified: Dr. Waldron Labs and Dr. Tyrell Antonio are bedside Call Time: [place] Time: [place] End Time: 1830  Venetia Maxon, RN

## 2022-02-01 NOTE — Progress Notes (Addendum)
Daily Rounding Note  2022-02-17, 11:39 AM  LOS: 1 day   SUBJECTIVE:   Chief complaint: Sigmoid volvulus.  Abdomen feels less tight than it did prior to flexible sigmoidoscopy.  Has not had any bowel movements.  Says she has not passed flatus.  No significant abdominal pain.  No nausea.  OBJECTIVE:         Vital signs in last 24 hours:    Temp:  [98.2 F (36.8 C)-98.4 F (36.9 C)] 98.3 F (36.8 C) (02/27 0739) Pulse Rate:  [82-95] 92 (02/27 0848) Resp:  [17-20] 17 (02/27 0739) BP: (132-168)/(74-121) 151/121 (02/27 0848) SpO2:  [84 %-97 %] 97 % (02/27 0739)   Filed Weights   01/22/2022 0551  Weight: 61.2 kg   General: Aged, somewhat frail but alert, comfortable. Heart: regular.  NSR on tele.  no murmur. Chest: Clear.  No labored breathing.  No cough. Abdomen: Moderately tight, not tender.  Bowel sounds hypoactive. Extremities: No CCE. Neuro/Psych: Pleasant, appropriate.  Not confused.  Intake/Output from previous day: 02/26 0701 - 02/27 0700 In: -  Out: 650 [Urine:650]  Intake/Output this shift: No intake/output data recorded.  Lab Results: Recent Labs    01/25/2022 2325 01/27/2022 0824 02/17/2022 0130  WBC 11.0* 13.6* 15.3*  HGB 12.3 12.6 13.4  HCT 40.9 40.7 42.1  PLT 249 237 253   BMET Recent Labs    01/13/2022 2325 01/04/2022 0824 02-17-22 0130  NA 130* 133* 136  K 4.4 3.7 4.0  CL 97* 99 102  CO2 24 21* 24  GLUCOSE 344* 303* 345*  BUN 18 19 22   CREATININE 1.18* 1.10* 1.20*  CALCIUM 9.8 9.6 9.2   LFT Recent Labs    01/27/2022 2325  PROT 7.9  ALBUMIN 4.2  AST 21  ALT 14  ALKPHOS 92  BILITOT 0.4    Studies/Results: CT Abdomen Pelvis W Contrast  Result Date: 01/26/2022 CLINICAL DATA:  Nausea, vomiting, abdominal pain EXAM: CT ABDOMEN AND PELVIS WITH CONTRAST TECHNIQUE: Multidetector CT imaging of the abdomen and pelvis was performed using the standard protocol following bolus administration  of intravenous contrast. RADIATION DOSE REDUCTION: This exam was performed according to the departmental dose-optimization program which includes automated exposure control, adjustment of the mA and/or kV according to patient size and/or use of iterative reconstruction technique. CONTRAST:  66mL OMNIPAQUE IOHEXOL 300 MG/ML  SOLN COMPARISON:  02/16/2010 FINDINGS: Lower chest: Small to moderate-sized hiatal hernia, enlarged. Coronary artery and aortic calcifications. No acute abnormality. Hepatobiliary: No focal hepatic abnormality. Gallbladder unremarkable. Pancreas: No focal abnormality or ductal dilatation. Spleen: No focal abnormality.  Normal size. Adrenals/Urinary Tract: Bilateral renal cysts. No hydronephrosis. X renal pelves bilaterally. Urinary bladder and adrenal glands unremarkable. Stomach/Bowel: Gaseous distention of the sigmoid colon. Inferiorly in the pelvis, the sigmoid colon twists compatible with sigmoid volvulus. Large stool burden in the sigmoid colon and proximal to the sigmoid volvulus. Distally, the rectum is decompressed. Small bowel decompressed. Vascular/Lymphatic: Aortic atherosclerosis. No evidence of aneurysm or adenopathy. Reproductive: Prior hysterectomy.  No adnexal masses. Other: No free fluid or free air. Musculoskeletal: No acute bony abnormality. Degenerative changes in the lumbar spine. IMPRESSION: Twisting of the sigmoid colon with dilated sigmoid colon extending into the upper abdomen compatible with sigmoid volvulus. Large amount of stool in the colon within the sigmoid volvulus and proximal to the volvulus. Small to moderate hiatal hernia. Bilateral renal cysts. Aortic atherosclerosis. These results were called by telephone at the time of interpretation  on 01/13/2022 at 2:38 am to provider [facility] , who verbally acknowledged these results. Electronically Signed   By: Rolm Baptise M.D.   On: 01/13/2022 02:44   DG Abd 2 Views  Result Date: Feb 26, 2022 CLINICAL DATA:   Abdominal pain and distention EXAM: ABDOMEN - 2 VIEW COMPARISON:  Previous studies including the examination of 01/14/2022 FINDINGS: There is interval worsening of dilation of sigmoid colon measuring up to 7.7 cm. There is moderate dilation of the descending colon. Large amount of stool is seen in the proximal colon. No abnormal masses or calcifications are seen. Degenerative changes are noted in the lumbar spine. IMPRESSION: There is interval worsening of gaseous dilation of sigmoid colon measuring up to 7.7 cm in diameter. Findings are consistent with sigmoid volvulus with possible worsening of obstruction. Electronically Signed   By: Elmer Picker M.D.   On: 26-Feb-2022 09:01   DG Abd 2 Views  Result Date: 01/10/2022 CLINICAL DATA:  Constipation EXAM: ABDOMEN - 2 VIEW COMPARISON:  08/29/2005 FINDINGS: Large stool burden throughout the colon. Gaseous distention of the sigmoid colon which extends into the upper abdomen. No free air or organomegaly. Visualized lung bases clear. IMPRESSION: Gaseous distention of the sigmoid colon. Cannot exclude sigmoid volvulus. Large stool burden throughout the colon. Abdominal CT may be helpful for further evaluation. Electronically Signed   By: Rolm Baptise M.D.   On: 01/27/2022 23:40   DG Abd Portable 2V  Result Date: 01/18/2022 CLINICAL DATA:  Sigmoid volvulus EXAM: PORTABLE ABDOMEN - 2 VIEW COMPARISON:  CT 01/18/2022 FINDINGS: Diffuse stool and gas above the previously demonstrated sigmoid volvulus which presumably persists given unchanged bowel gas pattern with high extending sigmoid loop. Contrast in the bladder from recent scan. No concerning gas collection or mass effect. Advanced lumbar spine degeneration IMPRESSION: Unchanged stool retention and colonic orientation compatible with persistent sigmoid volvulus. Electronically Signed   By: Jorje Guild M.D.   On: 01/04/2022 08:09    Scheduled Meds:  [START ON 01/31/2022] acetaminophen  1,000 mg Oral On  Call to OR   [START ON 01/31/2022] alvimopan  12 mg Oral On Call to OR   amLODipine  10 mg Oral Daily   brimonidine  1 drop Left Eye BID   Chlorhexidine Gluconate Cloth  6 each Topical Once   And   Chlorhexidine Gluconate Cloth  6 each Topical Once   enoxaparin (LOVENOX) injection  40 mg Subcutaneous Once   [START ON 01/31/2022] feeding supplement  296 mL Oral Once   feeding supplement  592 mL Oral Once   insulin aspart  0-5 Units Subcutaneous QHS   insulin aspart  0-9 Units Subcutaneous TID WC   insulin glargine-yfgn  5 Units Subcutaneous Daily   latanoprost  1 drop Left Eye QHS   metoprolol tartrate  25 mg Oral BID   neomycin  1,000 mg Oral 3 times per day   And   metroNIDAZOLE  1,000 mg Oral 3 times per day   pantoprazole (PROTONIX) IV  40 mg Intravenous Q12H   polyethylene glycol powder  1 Container Oral Once   Continuous Infusions:  sodium chloride 75 mL/hr at 02-26-22 0850   [START ON 01/31/2022] cefoTEtan (CEFOTAN) IV     PRN Meds:.acetaminophen **OR** acetaminophen, HYDROmorphone (DILAUDID) injection, ondansetron **OR** ondansetron (ZOFRAN) IV   ASSESMENT:   Sigmoid volvulus.  Notable previous laparotomies.  01/17/2022 flex sig with decompression of volvulus.  Incidental note of moderate to severe melanosis coli.  Background of chronic constipation.  Dr.  Lyndel Safe added daily MiraLAX. Day 1/3 of presurgical antibiotic prophylaxis with neomycin and Flagyl.  Dr. Bobbye Morton plans lap assisted versus open primary anastomosis versus colostomy.  Cardiac evaluation, surgical risk stratification in process.  Today's and yesterday 2 view abdomen shows persistent sigmoid volvulus    DM with blood sugars in low to mid 300s.  On Glucophage, not insulin PTA   PLAN   Per Dr. Carlean Purl.  At this point looks like surgery is high risk but necessary.  ? Need for NGT to LIS (however at present no vomiting or other obstructive symptoms present.)   Azucena Freed  02-26-22, 11:39 AM Phone [place] Attending   She has aspirated and is on 15 L O2 and PCCM eval underway.  We will be on standby - pulmonary issues predominate now and I suspect that surgery is at least going to be postponed.  Not sure we can "fix her" - will have to see how it plays out but the next 24-48 hrs will tell a lot.  Seems to me that goals of care, ? Hospice may be appropriate but family wishes aggressive care for now.  Gatha Mayer, MD, [facility] 02-26-22 5:36 PM

## 2022-02-01 NOTE — Progress Notes (Signed)
°  Transition of Care (TOC) Screening Note   Patient Details  Name: SADEEN WIEGEL Date of Birth: 07-Jun-1930   Transition of Care [facility]) CM/SW Contact:    Cyndi Bender, RN Phone Number: 02/27/22, 8:37 AM    Transition of Care Department [facility]) has reviewed patient and no TOC needs have been identified at this time. We will continue to monitor patient advancement through interdisciplinary progression rounds. If new patient transition needs arise, please place a TOC consult.

## 2022-02-01 NOTE — Progress Notes (Signed)
RT called to assess patient due to possible aspiration. Patient sitting in bed with 100% NRB, 02 sat stable at 96%. Chest xray pending. RT will continue to monitor.

## 2022-02-01 NOTE — Significant Event (Signed)
Met with patient and niece after evaluation.  Explained patient is profoundly weak, very weak to absent cough.  Whispering voice.  O2 sat low 90s on nonrebreather.  Mild increased work of breathing with neck muscles.  Expressed at length my concern that going on life support likely futile.  Expressed concern that weakness will only worsen while on the ventilator.  Her likelihood of liberation from the ventilator very low.  Niece states would not want her to suffer.  She called another niece and this was explained to her that individual.  In agreement, they agree patient should be DNR.  Explained we will continue aggressive therapy to make oxygenation better in the setting of aspiration event.  However, if she were to worsen would advocate for comfort care.  Would not place on the ventilator life support.  They expressed understanding.

## 2022-02-01 NOTE — Death Summary Note (Signed)
DEATH SUMMARY   Patient Details  Name: Erin Carr MRN: [id number] DOB: 1929/12/13 [id number], Nelda Bucks, NP  Admission/Discharge Information   Admit Date:  2022-02-06  Date of Death: Date of Death: February 08, 2022  Time of Death: Time of Death: February 20, 2027  Length of Stay: 1   Principle Cause of death: Aspiration Pneumonia, Acute Hypoxic Respiratory Failure.   Hospital Diagnoses: Principal Problem:   Aspiration pneumonia ([place]) Active Problems:   Type 2 diabetes mellitus without complication (HCC)   Hypertension   Sigmoid volvulus (HCC)   CKD (chronic kidney disease) stage 3, GFR 30-59 ml/min (HCC)   Hyponatremia   Pressure injury of skin   Acute respiratory failure with hypoxia [facility])   Hospital Course: 88 year old past medical history significant for diabetes type 2, COPD, heart failure preserved ejection fraction CKD stage III, hypertension, history of adenocarcinoma of right lung status post treatment, presenting for evaluation of worsening abdominal pain.  She has been constipated since last week.  She has a past medical history significant for exploratory laparotomy and additional lysis in 02/19/2005, status post hysterectomy.  CT abdomen and pelvis consistent with sigmoid volvulus. GI and surgery consulted.  Patient underwent flexible sigmoidoscopy with decompression of sigmoid volvulus.   Patient had aspiration event while eating clear liquid diet. Chest x ray consistent with PNA> Made NPO, started on Nebulizer. Chest x ray: New patchy opacities through both  lungs. She was started on IV zosyn, placed on NB mask. CCM consulted due to acute hypoxic respiratory failure. She was made DNR. Respiratory failure continue to progress, she became non responsive,  develops fixed upward gaze. Discussed with family at bedside, due to progressive respiratory failure, plan was to transition patient  to comfort measure. Comfort measure medications ordered. Patient died that night at 8:28 PM. Family was  at bedside.    Assessment and Plan: * Aspiration pneumonia ([place]) See, problem Acute Hypoxic Respiratory failure.  She was started on nebulizer, made NPO, IV antibiotics.   Acute respiratory failure with hypoxia ([place]) Patient had episode of aspiration PNA.  Oxygen drop to 69. She was placed on NB mask.  Nebulizer tx order.  Started IV zosyn order.  CCM consulted, Dr Silas Flood discussed with family, patient was made DNR. Subsequently patient became more lethargic, fixed gazed. I discussed with family and patient was transition to comfort care measure. Comfort measure medications ordered   Hyponatremia Setting of poor oral intake.  Continue with IV fluids. Component of  pseudohyponatremia due to hyperglycemia  CKD (chronic kidney disease) stage 3, GFR 30-59 ml/min (HCC) CKD stage IIIb Creatinine baseline 1.1--1.2 Monitor, Treated  with IV fluid  Sigmoid volvulus (HCC)- (present on admission) Patient presented with abdominal pain, constipation for more than 1 week, CT abdomen and pelvis consistent with sigmoid volvulus. -GI  consulted, underwent sigmoidoscopy for decompression, which  was successful. -Surgery following, recommending lap assisted vs open and primary anastomosis vs colostomy.  Cardiology consulted for risk assessment. Patient was thought to be high risk for procedure.   Hypertension- (present on admission) Resume metoprolol and Norvasc. Hold lisinopril and Lasix for now.  Type 2 diabetes mellitus without complication (HCC) Type 2 diabetes uncontrolled, with hyperglycemia. Hold metformin. Continue with sliding scale insulin.  If CBG continue to be elevated might need long-acting insulin          Procedures: ECHO  Consultations: Cardiology CCM GI general SX  The results of significant diagnostics from this hospitalization (including imaging, microbiology, ancillary and laboratory) are listed below for  reference.   Significant Diagnostic Studies: CT  Abdomen Pelvis W Contrast  Result Date: 01/27/2022 CLINICAL DATA:  Nausea, vomiting, abdominal pain EXAM: CT ABDOMEN AND PELVIS WITH CONTRAST TECHNIQUE: Multidetector CT imaging of the abdomen and pelvis was performed using the standard protocol following bolus administration of intravenous contrast. RADIATION DOSE REDUCTION: This exam was performed according to the departmental dose-optimization program which includes automated exposure control, adjustment of the mA and/or kV according to patient size and/or use of iterative reconstruction technique. CONTRAST:  64mL OMNIPAQUE IOHEXOL 300 MG/ML  SOLN COMPARISON:  02/16/2010 FINDINGS: Lower chest: Small to moderate-sized hiatal hernia, enlarged. Coronary artery and aortic calcifications. No acute abnormality. Hepatobiliary: No focal hepatic abnormality. Gallbladder unremarkable. Pancreas: No focal abnormality or ductal dilatation. Spleen: No focal abnormality.  Normal size. Adrenals/Urinary Tract: Bilateral renal cysts. No hydronephrosis. X renal pelves bilaterally. Urinary bladder and adrenal glands unremarkable. Stomach/Bowel: Gaseous distention of the sigmoid colon. Inferiorly in the pelvis, the sigmoid colon twists compatible with sigmoid volvulus. Large stool burden in the sigmoid colon and proximal to the sigmoid volvulus. Distally, the rectum is decompressed. Small bowel decompressed. Vascular/Lymphatic: Aortic atherosclerosis. No evidence of aneurysm or adenopathy. Reproductive: Prior hysterectomy.  No adnexal masses. Other: No free fluid or free air. Musculoskeletal: No acute bony abnormality. Degenerative changes in the lumbar spine. IMPRESSION: Twisting of the sigmoid colon with dilated sigmoid colon extending into the upper abdomen compatible with sigmoid volvulus. Large amount of stool in the colon within the sigmoid volvulus and proximal to the volvulus. Small to moderate hiatal hernia. Bilateral renal cysts. Aortic atherosclerosis. These results were  called by telephone at the time of interpretation on 01/16/2022 at 2:38 am to provider [facility] , who verbally acknowledged these results. Electronically Signed   By: Rolm Baptise M.D.   On: 01/14/2022 02:44   DG CHEST PORT 1 VIEW  Result Date: 02-19-22 CLINICAL DATA:  Aspiration into the airway. EXAM: PORTABLE CHEST 1 VIEW COMPARISON:  Chest radiograph 06/07/2021 and 10/22/2017. Abdominal CT 01/16/2022 and chest CT 11/23/2021. FINDINGS: 1448 hours. There are significantly lower lung volumes. Allowing for this, the heart size and mediastinal contours are stable with aortic atherosclerosis. Chronic right upper lobe linear scarring is unchanged, attributed to prior radiation for lung cancer. There are new patchy airspace opacities throughout both lungs which could be on the basis of aspiration or edema. There is no confluent airspace opacity, pneumothorax or significant pleural effusion. IMPRESSION: New patchy airspace opacities throughout both lungs associated with lower lung volumes, suspicious for aspiration given provided history. Stable radiation changes in the right upper lobe. Electronically Signed   By: Richardean Sale M.D.   On: 2022-02-19 15:05   DG Abd 2 Views  Result Date: Feb 19, 2022 CLINICAL DATA:  Abdominal pain and distention EXAM: ABDOMEN - 2 VIEW COMPARISON:  Previous studies including the examination of 01/26/2022 FINDINGS: There is interval worsening of dilation of sigmoid colon measuring up to 7.7 cm. There is moderate dilation of the descending colon. Large amount of stool is seen in the proximal colon. No abnormal masses or calcifications are seen. Degenerative changes are noted in the lumbar spine. IMPRESSION: There is interval worsening of gaseous dilation of sigmoid colon measuring up to 7.7 cm in diameter. Findings are consistent with sigmoid volvulus with possible worsening of obstruction. Electronically Signed   By: Elmer Picker M.D.   On: 02/19/2022 09:01   DG Abd 2  Views  Result Date: 01/22/2022 CLINICAL DATA:  Constipation EXAM: ABDOMEN - 2 VIEW  COMPARISON:  08/29/2005 FINDINGS: Large stool burden throughout the colon. Gaseous distention of the sigmoid colon which extends into the upper abdomen. No free air or organomegaly. Visualized lung bases clear. IMPRESSION: Gaseous distention of the sigmoid colon. Cannot exclude sigmoid volvulus. Large stool burden throughout the colon. Abdominal CT may be helpful for further evaluation. Electronically Signed   By: Rolm Baptise M.D.   On: 01/25/2022 23:40   DG Abd Portable 2V  Result Date: 01/08/2022 CLINICAL DATA:  Sigmoid volvulus EXAM: PORTABLE ABDOMEN - 2 VIEW COMPARISON:  CT 01/08/2022 FINDINGS: Diffuse stool and gas above the previously demonstrated sigmoid volvulus which presumably persists given unchanged bowel gas pattern with high extending sigmoid loop. Contrast in the bladder from recent scan. No concerning gas collection or mass effect. Advanced lumbar spine degeneration IMPRESSION: Unchanged stool retention and colonic orientation compatible with persistent sigmoid volvulus. Electronically Signed   By: Jorje Guild M.D.   On: 01/18/2022 08:09   ECHOCARDIOGRAM COMPLETE  Result Date: 02-06-22    ECHOCARDIOGRAM REPORT   Patient Name:   ALAJIA SCHMELZER Date of Exam: 2022-02-06 Medical Rec #:  [id number]       Height:       62.0 in Accession #:    [id number]      Weight:       135.0 lb Date of Birth:  1930/07/07      BSA:          1.618 m Patient Age:    88 years        BP:           110/85 mmHg Patient Gender: F               HR:           78 bpm. Exam Location:  Inpatient Procedure: 2D Echo, Cardiac Doppler and Color Doppler Indications:     Preoperative evaluation  History:         Patient has prior history of Echocardiogram examinations, most                  recent 10/24/2017. COPD, Signs/Symptoms:Dyspnea and Shortness                  of Breath; Risk Factors:Hypertension and Diabetes.  Sonographer:      Bernadene Person RDCS Referring Phys:  [place] Diagnosing Phys: Charolette Forward MD  Sonographer Comments: Image acquisition challenging due to respiratory motion. IMPRESSIONS  1. Left ventricular ejection fraction, by estimation, is 55 to 60%. The left ventricle has normal function. The left ventricle has no regional wall motion abnormalities. Left ventricular diastolic parameters are indeterminate.  2. Right ventricular systolic function is normal. The right ventricular size is normal. There is normal pulmonary artery systolic pressure.  3. The mitral valve is normal in structure. Mild to moderate mitral valve regurgitation.  4. The aortic valve is tricuspid. Aortic valve regurgitation is not visualized. Aortic valve sclerosis/calcification is present, without any evidence of aortic stenosis. FINDINGS  Left Ventricle: Left ventricular ejection fraction, by estimation, is 55 to 60%. The left ventricle has normal function. The left ventricle has no regional wall motion abnormalities. The left ventricular internal cavity size was small. There is no left ventricular hypertrophy. Left ventricular diastolic parameters are indeterminate. Right Ventricle: The right ventricular size is normal. No increase in right ventricular wall thickness. Right ventricular systolic function is normal. There is normal pulmonary artery systolic pressure. The tricuspid regurgitant velocity is 2.31 m/s,  and  with an assumed right atrial pressure of 3 mmHg, the estimated right ventricular systolic pressure is 42.3 mmHg. Left Atrium: Left atrial size was normal in size. Right Atrium: Right atrial size was normal in size. Pericardium: There is no evidence of pericardial effusion. Mitral Valve: The mitral valve is normal in structure. Mild to moderate mitral valve regurgitation. Tricuspid Valve: The tricuspid valve is normal in structure. Tricuspid valve regurgitation is trivial. Aortic Valve: The aortic valve is tricuspid. Aortic valve  regurgitation is not visualized. Aortic valve sclerosis/calcification is present, without any evidence of aortic stenosis. Pulmonic Valve: The pulmonic valve was not well visualized. Pulmonic valve regurgitation is not visualized. Aorta: The aortic root is normal in size and structure. IAS/Shunts: No atrial level shunt detected by color flow Doppler.  LEFT VENTRICLE PLAX 2D LVIDd:         2.90 cm     Diastology LVIDs:         1.80 cm     LV e' medial:    2.68 cm/s LV PW:         0.80 cm     LV E/e' medial:  12.0 LV IVS:        0.90 cm     LV e' lateral:   4.38 cm/s LVOT diam:     1.80 cm     LV E/e' lateral: 7.3 LV SV:         24 LV SV Index:   15 LVOT Area:     2.54 cm  LV Volumes (MOD) LV vol d, MOD A2C: 30.2 ml LV vol d, MOD A4C: 39.0 ml LV vol s, MOD A2C: 14.1 ml LV vol s, MOD A4C: 13.6 ml LV SV MOD A2C:     16.1 ml LV SV MOD A4C:     39.0 ml LV SV MOD BP:      21.1 ml RIGHT VENTRICLE RV S prime:     9.49 cm/s TAPSE (M-mode): 1.0 cm LEFT ATRIUM             Index        RIGHT ATRIUM          Index LA diam:        2.70 cm 1.67 cm/m   RA Area:     7.59 cm LA Vol (A2C):   19.5 ml 12.05 ml/m  RA Volume:   10.90 ml 6.74 ml/m LA Vol (A4C):   30.3 ml 18.73 ml/m LA Biplane Vol: 26.6 ml 16.44 ml/m  AORTIC VALVE LVOT Vmax:   58.50 cm/s LVOT Vmean:  35.800 cm/s LVOT VTI:    0.093 m  AORTA Ao Root diam: 2.70 cm Ao Asc diam:  2.80 cm MITRAL VALVE               TRICUSPID VALVE MV Area (PHT): 2.20 cm    TR Peak grad:   21.3 mmHg MV Decel Time: 345 msec    TR Vmax:        231.00 cm/s MV E velocity: 32.10 cm/s MV A velocity: 93.40 cm/s  SHUNTS MV E/A ratio:  0.34        Systemic VTI:  0.09 m                            Systemic Diam: 1.80 cm Charolette Forward MD Electronically signed by Charolette Forward MD Signature Date/Time: Feb 07, 2022/5:10:00 PM    Final     Microbiology:  Recent Results (from the past 240 hour(s))  Resp Panel by RT-PCR (Flu A&B, Covid) Nasopharyngeal Swab     Status: None   Collection Time: 01/31/2022  2:47  AM   Specimen: Nasopharyngeal Swab; Nasopharyngeal(NP) swabs in vial transport medium  Result Value Ref Range Status   SARS Coronavirus 2 by RT PCR NEGATIVE NEGATIVE Final    Comment: (NOTE) SARS-CoV-2 target nucleic acids are NOT DETECTED.  The SARS-CoV-2 RNA is generally detectable in upper respiratory specimens during the acute phase of infection. The lowest concentration of SARS-CoV-2 viral copies this assay can detect is 138 copies/mL. A negative result does not preclude SARS-Cov-2 infection and should not be used as the sole basis for treatment or other patient management decisions. A negative result may occur with  improper specimen collection/handling, submission of specimen other than nasopharyngeal swab, presence of viral mutation(s) within the areas targeted by this assay, and inadequate number of viral copies(<138 copies/mL). A negative result must be combined with clinical observations, patient history, and epidemiological information. The expected result is Negative.  Fact Sheet for Patients:  EntrepreneurPulse.com.au  Fact Sheet for Healthcare Providers:  IncredibleEmployment.be  This test is no t yet approved or cleared by the [place] FDA and  has been authorized for detection and/or diagnosis of SARS-CoV-2 by FDA under an Emergency Use Authorization (EUA). This EUA will remain  in effect (meaning this test can be used) for the duration of the COVID-19 declaration under Section 564(b)(1) of the Act, 21 U.S.C.section 360bbb-3(b)(1), unless the authorization is terminated  or revoked sooner.       Influenza A by PCR NEGATIVE NEGATIVE Final   Influenza B by PCR NEGATIVE NEGATIVE Final    Comment: (NOTE) The Xpert Xpress SARS-CoV-2/FLU/RSV plus assay is intended as an aid in the diagnosis of influenza from Nasopharyngeal swab specimens and should not be used as a sole basis for treatment. Nasal washings and aspirates are  unacceptable for Xpert Xpress SARS-CoV-2/FLU/RSV testing.  Fact Sheet for Patients: EntrepreneurPulse.com.au  Fact Sheet for Healthcare Providers: IncredibleEmployment.be  This test is not yet approved or cleared by the [place] FDA and has been authorized for detection and/or diagnosis of SARS-CoV-2 by FDA under an Emergency Use Authorization (EUA). This EUA will remain in effect (meaning this test can be used) for the duration of the COVID-19 declaration under Section 564(b)(1) of the Act, 21 U.S.C. section 360bbb-3(b)(1), unless the authorization is terminated or revoked.  Performed at [facility], [place] [address] [place], [place] [address]     Time spent: 45 minutes  Signed: Elmarie Shiley, MD February 02, 2022

## 2022-02-01 NOTE — Progress Notes (Signed)
Patient became more lethargic, gaze fixated, non Verbal.  She is still requiring NB mask, oxygen fluctuates.  She is now DNR/DNI.  I discussed with family, plan for no further scalation of care. Continue with fluids until others family members come.  They are ok with comfort care medications; for anxiety, Dyspnea, increase work of breathing.  DC telemetry, comfort care order in place.   Niel Hummer, MD.

## 2022-02-01 NOTE — Progress Notes (Signed)
Inpatient Diabetes Program Recommendations  AACE/ADA: New Consensus Statement on Inpatient Glycemic Control (2015)  Target Ranges:  Prepandial:   less than 140 mg/dL      Peak postprandial:   less than 180 mg/dL (1-2 hours)      Critically ill patients:  140 - 180 mg/dL   Lab Results  Component Value Date   GLUCAP 312 (H) 01/31/2022   HGBA1C 8.2 (H) 01/19/2022    Review of Glycemic Control  Latest Reference Range & Units 01/22/2022 11:42 01/05/2022 15:59 01/13/2022 19:29 01/31/2022 07:41  Glucose-Capillary 70 - 99 mg/dL 175 (H) 99 155 (H) 312 (H)   Diabetes history: DM 2 Outpatient Diabetes medications:  Metformin 500 mg bid Current orders for Inpatient glycemic control:  Novolog sensitive (0-9 units) q 8 hours (prn) Novolog moderate (0-15 units) tid with meals  Inpatient Diabetes Program Recommendations:    Consider adding Semglee 8 units daily. Also please d/c prn correction (Novolog 0-9 units).   Thanks,  Adah Perl, RN, BC-ADM Inpatient Diabetes Coordinator Pager [phone]  (8a-5p)

## 2022-02-01 DEATH — deceased

## 2022-02-07 ENCOUNTER — Ambulatory Visit: Payer: PRIVATE HEALTH INSURANCE

## 2022-02-07 ENCOUNTER — Ambulatory Visit: Payer: PRIVATE HEALTH INSURANCE | Admitting: Podiatry

## 2022-02-10 ENCOUNTER — Other Ambulatory Visit: Payer: Self-pay | Admitting: Family

## 2022-02-10 ENCOUNTER — Other Ambulatory Visit: Payer: Medicare Other

## 2022-02-20 ENCOUNTER — Ambulatory Visit: Payer: 59 | Admitting: *Deleted

## 2022-03-10 ENCOUNTER — Other Ambulatory Visit: Payer: Medicare Other

## 2022-03-16 ENCOUNTER — Ambulatory Visit: Payer: Medicare Other | Admitting: Family
